# Patient Record
Sex: Female | Born: 1975 | Race: White | Hispanic: No | Marital: Single | Smoking: Former smoker
Health system: Southern US, Community
[De-identification: ages and names within clinical notes are randomized; demographics above are authoritative.]

## PROBLEM LIST (undated history)

## (undated) DIAGNOSIS — K259 Gastric ulcer, unspecified as acute or chronic, without hemorrhage or perforation: Secondary | ICD-10-CM

## (undated) DIAGNOSIS — J449 Chronic obstructive pulmonary disease, unspecified: Secondary | ICD-10-CM

## (undated) DIAGNOSIS — M810 Age-related osteoporosis without current pathological fracture: Secondary | ICD-10-CM

## (undated) DIAGNOSIS — J189 Pneumonia, unspecified organism: Secondary | ICD-10-CM

## (undated) DIAGNOSIS — K219 Gastro-esophageal reflux disease without esophagitis: Secondary | ICD-10-CM

## (undated) HISTORY — DX: Gastro-esophageal reflux disease without esophagitis: K21.9

## (undated) HISTORY — PX: CHOLECYSTECTOMY: SHX55

## (undated) HISTORY — DX: Pneumonia, unspecified organism: J18.9

---

## 2003-07-20 ENCOUNTER — Emergency Department (HOSPITAL_COMMUNITY): Admission: EM | Admit: 2003-07-20 | Discharge: 2003-07-20 | Payer: Self-pay | Admitting: *Deleted

## 2007-02-18 ENCOUNTER — Emergency Department (HOSPITAL_COMMUNITY): Admission: EM | Admit: 2007-02-18 | Discharge: 2007-02-18 | Payer: Self-pay | Admitting: Emergency Medicine

## 2007-04-01 ENCOUNTER — Emergency Department (HOSPITAL_COMMUNITY): Admission: EM | Admit: 2007-04-01 | Discharge: 2007-04-01 | Payer: Self-pay | Admitting: Emergency Medicine

## 2007-05-17 ENCOUNTER — Emergency Department (HOSPITAL_COMMUNITY): Admission: EM | Admit: 2007-05-17 | Discharge: 2007-05-17 | Payer: Self-pay | Admitting: Emergency Medicine

## 2007-07-06 ENCOUNTER — Emergency Department (HOSPITAL_COMMUNITY): Admission: EM | Admit: 2007-07-06 | Discharge: 2007-07-06 | Payer: Self-pay | Admitting: Emergency Medicine

## 2007-09-23 ENCOUNTER — Emergency Department (HOSPITAL_COMMUNITY): Admission: EM | Admit: 2007-09-23 | Discharge: 2007-09-23 | Payer: Self-pay | Admitting: Emergency Medicine

## 2007-11-30 ENCOUNTER — Emergency Department (HOSPITAL_COMMUNITY): Admission: EM | Admit: 2007-11-30 | Discharge: 2007-12-01 | Payer: Self-pay | Admitting: Emergency Medicine

## 2008-04-17 ENCOUNTER — Emergency Department (HOSPITAL_COMMUNITY): Admission: EM | Admit: 2008-04-17 | Discharge: 2008-04-17 | Payer: Self-pay | Admitting: Emergency Medicine

## 2008-10-16 ENCOUNTER — Emergency Department (HOSPITAL_COMMUNITY): Admission: EM | Admit: 2008-10-16 | Discharge: 2008-10-16 | Payer: Self-pay | Admitting: Emergency Medicine

## 2008-10-21 ENCOUNTER — Emergency Department (HOSPITAL_COMMUNITY): Admission: EM | Admit: 2008-10-21 | Discharge: 2008-10-21 | Payer: Self-pay | Admitting: Emergency Medicine

## 2009-06-18 ENCOUNTER — Observation Stay (HOSPITAL_COMMUNITY): Admission: EM | Admit: 2009-06-18 | Discharge: 2009-06-19 | Payer: Self-pay | Admitting: Emergency Medicine

## 2009-06-19 ENCOUNTER — Observation Stay (HOSPITAL_COMMUNITY): Admission: EM | Admit: 2009-06-19 | Discharge: 2009-06-19 | Payer: Self-pay | Admitting: Emergency Medicine

## 2009-08-09 ENCOUNTER — Emergency Department (HOSPITAL_COMMUNITY): Admission: EM | Admit: 2009-08-09 | Discharge: 2009-08-10 | Payer: Self-pay | Admitting: Emergency Medicine

## 2009-12-31 ENCOUNTER — Emergency Department (HOSPITAL_COMMUNITY): Admission: EM | Admit: 2009-12-31 | Discharge: 2009-12-31 | Payer: Self-pay | Admitting: Emergency Medicine

## 2010-04-30 ENCOUNTER — Emergency Department (HOSPITAL_COMMUNITY): Admission: EM | Admit: 2010-04-30 | Discharge: 2010-04-30 | Payer: Self-pay | Admitting: Emergency Medicine

## 2010-06-23 ENCOUNTER — Emergency Department (HOSPITAL_COMMUNITY): Admission: EM | Admit: 2010-06-23 | Discharge: 2010-06-23 | Payer: Self-pay | Admitting: Emergency Medicine

## 2010-10-14 LAB — URINALYSIS, ROUTINE W REFLEX MICROSCOPIC
Bilirubin Urine: NEGATIVE
Hgb urine dipstick: NEGATIVE
Nitrite: NEGATIVE
Specific Gravity, Urine: 1.025 (ref 1.005–1.030)

## 2010-10-14 LAB — GC/CHLAMYDIA PROBE AMP, GENITAL
Chlamydia, DNA Probe: NEGATIVE
GC Probe Amp, Genital: POSITIVE — AB

## 2010-10-19 LAB — DIFFERENTIAL
Basophils Absolute: 0 10*3/uL (ref 0.0–0.1)
Eosinophils Absolute: 0 10*3/uL (ref 0.0–0.7)
Lymphocytes Relative: 12 % (ref 12–46)
Monocytes Absolute: 0.5 10*3/uL (ref 0.1–1.0)
Neutro Abs: 6 10*3/uL (ref 1.7–7.7)

## 2010-10-19 LAB — BASIC METABOLIC PANEL
CO2: 22 mEq/L (ref 19–32)
Calcium: 8.6 mg/dL (ref 8.4–10.5)
Creatinine, Ser: 0.69 mg/dL (ref 0.4–1.2)
Glucose, Bld: 110 mg/dL — ABNORMAL HIGH (ref 70–99)
Potassium: 4.3 mEq/L (ref 3.5–5.1)
Sodium: 132 mEq/L — ABNORMAL LOW (ref 135–145)

## 2010-10-19 LAB — URINALYSIS, ROUTINE W REFLEX MICROSCOPIC
Bilirubin Urine: NEGATIVE
Glucose, UA: NEGATIVE mg/dL
Leukocytes, UA: NEGATIVE
Nitrite: NEGATIVE
Specific Gravity, Urine: 1.015 (ref 1.005–1.030)
pH: 6 (ref 5.0–8.0)

## 2010-10-19 LAB — CBC
HCT: 40.1 % (ref 36.0–46.0)
Hemoglobin: 14 g/dL (ref 12.0–15.0)

## 2010-10-19 LAB — URINE MICROSCOPIC-ADD ON

## 2010-10-19 LAB — POCT PREGNANCY, URINE: Preg Test, Ur: NEGATIVE

## 2010-10-20 LAB — URINALYSIS, ROUTINE W REFLEX MICROSCOPIC
Bilirubin Urine: NEGATIVE
Hgb urine dipstick: NEGATIVE
Urobilinogen, UA: 0.2 mg/dL (ref 0.0–1.0)

## 2010-11-05 LAB — CBC
HCT: 43.1 % (ref 36.0–46.0)
Hemoglobin: 15 g/dL (ref 12.0–15.0)
MCV: 97.3 fL (ref 78.0–100.0)

## 2010-11-05 LAB — DIFFERENTIAL
Basophils Absolute: 0.4 10*3/uL — ABNORMAL HIGH (ref 0.0–0.1)
Lymphs Abs: 3.9 10*3/uL (ref 0.7–4.0)
Monocytes Absolute: 0.6 10*3/uL (ref 0.1–1.0)
Neutro Abs: 4.8 10*3/uL (ref 1.7–7.7)
Neutrophils Relative %: 48 % (ref 43–77)

## 2010-11-05 LAB — POCT I-STAT, CHEM 8
Calcium, Ion: 0.96 mmol/L — ABNORMAL LOW (ref 1.12–1.32)
Chloride: 107 mEq/L (ref 96–112)
Creatinine, Ser: 0.7 mg/dL (ref 0.4–1.2)
Glucose, Bld: 130 mg/dL — ABNORMAL HIGH (ref 70–99)
HCT: 45 % (ref 36.0–46.0)
Potassium: 3.7 mEq/L (ref 3.5–5.1)
Sodium: 137 mEq/L (ref 135–145)
TCO2: 22 mmol/L (ref 0–100)

## 2010-11-05 LAB — POCT CARDIAC MARKERS
CKMB, poc: <1 ng/mL — ABNORMAL LOW (ref 1.0–8.0)
Myoglobin, poc: 30 ng/mL (ref 12–200)
Troponin i, poc: <0.05 ng/mL (ref 0.00–0.09)

## 2011-04-17 ENCOUNTER — Encounter: Payer: Self-pay | Admitting: Emergency Medicine

## 2011-04-17 ENCOUNTER — Emergency Department (HOSPITAL_COMMUNITY)
Admission: EM | Admit: 2011-04-17 | Discharge: 2011-04-17 | Disposition: A | Discharge status: Home / Self Care | Payer: Self-pay | Attending: Emergency Medicine | Admitting: Emergency Medicine

## 2011-04-17 DIAGNOSIS — J45909 Unspecified asthma, uncomplicated: Secondary | ICD-10-CM | POA: Insufficient documentation

## 2011-04-17 DIAGNOSIS — F17200 Nicotine dependence, unspecified, uncomplicated: Secondary | ICD-10-CM | POA: Insufficient documentation

## 2011-04-17 DIAGNOSIS — S61209A Unspecified open wound of unspecified finger without damage to nail, initial encounter: Secondary | ICD-10-CM | POA: Insufficient documentation

## 2011-04-17 DIAGNOSIS — W268XXA Contact with other sharp object(s), not elsewhere classified, initial encounter: Secondary | ICD-10-CM | POA: Insufficient documentation

## 2011-04-17 DIAGNOSIS — S61219A Laceration without foreign body of unspecified finger without damage to nail, initial encounter: Secondary | ICD-10-CM

## 2011-04-17 MED ORDER — LIDOCAINE HCL (PF) 2 % IJ SOLN
INTRAMUSCULAR | Status: AC
  Administered 2011-04-17: 17:00:00
  Filled 2011-04-17: qty 10

## 2011-04-17 NOTE — ED Provider Notes (Signed)
Medical screening examination/treatment/procedure(s) were performed by non-physician practitioner and as supervising physician I was immediately available for consultation/collaboration.  [name], MD 04/17/11 [name]

## 2011-04-17 NOTE — ED Notes (Signed)
Pt reports cutting her left middle finger today on a metal can.  Bleeding is controlled.  Pt cannot recall her last tetanus vaccination.

## 2011-04-17 NOTE — ED Notes (Signed)
Pt cut L middle finger opening a can of chicken breasts. Cut on metal lid.

## 2011-04-17 NOTE — ED Provider Notes (Signed)
History     CSN: [id number] Arrival date & time: 04/17/2011  3:23 PM   Chief Complaint  Patient presents with  . Laceration    L 2nd digit     (Include location/radiation/quality/duration/timing/severity/associated sxs/prior treatment) Patient is a 35 y.o. female presenting with skin laceration. The history is provided by the patient. No language interpreter was used.  Laceration  The incident occurred 1 to 2 hours ago. The laceration is located on the left hand. The laceration is 2 cm in size. The laceration mechanism was a a metal edge. The pain is moderate. The pain has been constant since onset. She reports no foreign bodies present. Her tetanus status is UTD.     Past Medical History  Diagnosis Date  . Asthma      Past Surgical History  Procedure Date  . Cholecystectomy     Family History  Problem Relation Age of Onset  . Hypertension Mother   . Hypertension Father     History  Substance Use Topics  . Smoking status: Current Everyday Smoker -- 0.5 packs/day for 22 years    Types: Cigarettes  . Smokeless tobacco: Never Used  . Alcohol Use: Yes     occasionally    OB History    Grav Para Term Preterm Abortions TAB SAB Ect Mult Living   4 4 4       4       Review of Systems  Skin: Positive for wound.  All other systems reviewed and are negative.    Allergies  Review of patient's allergies indicates no known allergies.  Home Medications   Current Outpatient Rx  Name Route Sig Dispense Refill  . ALBUTEROL IN Inhalation Inhale 2 puffs into the lungs as needed. For shortness of breath       Physical Exam    BP 114/81  Pulse 100  Temp(Src) 98.6 F (37 C) (Oral)  Resp 14  Ht 5\' 4"  (1.626 m)  Wt 202 lb (91.627 kg)  BMI 34.67 kg/m2  SpO2 97%  LMP 03/23/2011  Physical Exam  Nursing note and vitals reviewed. Constitutional: She is oriented to person, place, and time. Vital signs are normal. She appears well-developed and well-nourished.  HENT:   Head: Normocephalic and atraumatic.  Right Ear: External ear normal.  Left Ear: External ear normal.  Nose: Nose normal.  Mouth/Throat: No oropharyngeal exudate.  Eyes: Conjunctivae and EOM are normal. Pupils are equal, round, and reactive to light. Right eye exhibits no discharge. Left eye exhibits no discharge. No scleral icterus.  Neck: Normal range of motion. Neck supple. No JVD present. No tracheal deviation present. No thyromegaly present.  Cardiovascular: Normal rate, regular rhythm, normal heart sounds, intact distal pulses and normal pulses.  Exam reveals no gallop and no friction rub.   No murmur heard. Pulmonary/Chest: Effort normal and breath sounds normal. No stridor. No respiratory distress. She has no wheezes. She has no rales. She exhibits no tenderness.  Abdominal: Soft. Normal appearance and bowel sounds are normal. She exhibits no distension and no mass. There is no tenderness. There is no rebound and no guarding.  Musculoskeletal: Normal range of motion. She exhibits tenderness. She exhibits no edema.       Hands: Lymphadenopathy:    She has no cervical adenopathy.  Neurological: She is alert and oriented to person, place, and time. She has normal strength and normal reflexes. No cranial nerve deficit or sensory deficit. Coordination normal. GCS eye subscore is 4. GCS verbal subscore  is 5. GCS motor subscore is 6.  Skin: Skin is warm and dry. No rash noted. She is not diaphoretic.  Psychiatric: She has a normal mood and affect. Her speech is normal and behavior is normal. Judgment and thought content normal. Cognition and memory are normal.    ED Course  LACERATION REPAIR Date/Time: 04/17/2011 4:30 PM Performed by: Worthy Rancher Authorized by: [name] Consent: Verbal consent obtained. Written consent not obtained. Risks and benefits: risks, benefits and alternatives were discussed Consent given by: patient Patient understanding: patient states understanding of  the procedure being performed Imaging studies: imaging studies not available Patient identity confirmed: verbally with patient Time out: Immediately prior to procedure a "time out" was called to verify the correct patient, procedure, equipment, support staff and site/side marked as required. Body area: upper extremity Laceration length: 2 cm Foreign bodies: no foreign bodies Tendon involvement: none Nerve involvement: none Vascular damage: no Anesthesia: local infiltration Local anesthetic: lidocaine 2% without epinephrine Anesthetic total: 2 ml Patient sedated: no Preparation: Patient was prepped and draped in the usual sterile fashion. Irrigation solution: saline Irrigation method: syringe Amount of cleaning: extensive Debridement: none Degree of undermining: none Skin closure: 4-0 nylon Number of sutures: 4 Technique: simple Approximation: close Approximation difficulty: simple Dressing: antibiotic ointment Patient tolerance: Patient tolerated the procedure well with no immediate complications.    Results for orders placed during the hospital encounter of 06/23/10  WET PREP, GENITAL      Component Value Range   Yeast, Wet Prep NONE SEEN  NONE SEEN    Trich, Wet Prep MODERATE (*) NONE SEEN    Clue Cells, Wet Prep FEW (*) NONE SEEN    WBC, Wet Prep HPF POC FEW (*) NONE SEEN   URINALYSIS, ROUTINE W REFLEX MICROSCOPIC      Component Value Range   Color, Urine YELLOW  YELLOW    Appearance CLEAR  CLEAR    Specific Gravity, Urine 1.025  1.005 - 1.030    pH 6.0  5.0 - 8.0    Glucose, UA NEGATIVE  NEGATIVE (mg/dL)   Hgb urine dipstick NEGATIVE  NEGATIVE    Bilirubin Urine NEGATIVE  NEGATIVE    Ketones, ur NEGATIVE  NEGATIVE (mg/dL)   Protein, ur NEGATIVE  NEGATIVE (mg/dL)   Urobilinogen, UA 1.0  0.0 - 1.0 (mg/dL)   Nitrite NEGATIVE  NEGATIVE    Leukocytes, UA    NEGATIVE    Value: NEGATIVE MICROSCOPIC NOT DONE ON URINES WITH NEGATIVE PROTEIN, BLOOD, LEUKOCYTES, NITRITE,  OR GLUCOSE <1000 mg/dL.  POCT PREGNANCY, URINE      Component Value Range   Preg Test, Ur       Value: NEGATIVE            THE SENSITIVITY OF THIS     METHODOLOGY IS >24 mIU/mL  GC/CHLAMYDIA PROBE AMP, GENITAL      Component Value Range   GC Probe Amp, Genital   (*) NEGATIVE    Value: POSITIVE     (NOTE)  Testing performed using the BD Probe[employer] Qx Chlamydia trachomatis and Neisseria gonorrhea amplified DNA assay.  Performed at:  [facility]               [address]                    [place], [place] [address]  [id number]   Chlamydia, DNA Probe    NEGATIVE    Value: NEGATIVE     (NOTE)  Testing performed using the BD Probe[employer] Qx Chlamydia trachomatis and Neisseria gonorrhea amplified DNA assay.  Performed at:  [facility]               [address]                    [place], [place] [address]               [id number]   No results found.   No diagnosis found.   MDM        Worthy Rancher, PA 04/17/11 [phone]

## 2011-04-24 LAB — POCT I-STAT CREATININE
Creatinine, Ser: 0.8
Operator id: 295131

## 2011-04-24 LAB — I-STAT 8, (EC8 V) (CONVERTED LAB)
Acid-Base Excess: 1
BUN: 11
Chloride: 104
Glucose, Bld: 94
Potassium: 4
pH, Ven: 7.482 — ABNORMAL HIGH

## 2011-05-14 LAB — RAPID STREP SCREEN (MED CTR MEBANE ONLY): Streptococcus, Group A Screen (Direct): NEGATIVE

## 2011-05-14 LAB — STREP A DNA PROBE

## 2011-10-24 ENCOUNTER — Encounter (HOSPITAL_COMMUNITY): Payer: Self-pay | Admitting: Emergency Medicine

## 2011-10-24 ENCOUNTER — Emergency Department (HOSPITAL_COMMUNITY): Payer: Self-pay

## 2011-10-24 ENCOUNTER — Emergency Department (HOSPITAL_COMMUNITY)
Admission: EM | Admit: 2011-10-24 | Discharge: 2011-10-24 | Disposition: A | Discharge status: Home / Self Care | Payer: Self-pay | Attending: Emergency Medicine | Admitting: Emergency Medicine

## 2011-10-24 DIAGNOSIS — J45909 Unspecified asthma, uncomplicated: Secondary | ICD-10-CM | POA: Insufficient documentation

## 2011-10-24 DIAGNOSIS — W19XXXA Unspecified fall, initial encounter: Secondary | ICD-10-CM | POA: Insufficient documentation

## 2011-10-24 DIAGNOSIS — S93409A Sprain of unspecified ligament of unspecified ankle, initial encounter: Secondary | ICD-10-CM | POA: Insufficient documentation

## 2011-10-24 DIAGNOSIS — Y92009 Unspecified place in unspecified non-institutional (private) residence as the place of occurrence of the external cause: Secondary | ICD-10-CM | POA: Insufficient documentation

## 2011-10-24 DIAGNOSIS — F17200 Nicotine dependence, unspecified, uncomplicated: Secondary | ICD-10-CM | POA: Insufficient documentation

## 2011-10-24 MED ORDER — IBUPROFEN 800 MG PO TABS
800.0000 mg | ORAL_TABLET | Freq: Once | ORAL | Status: AC
  Administered 2011-10-24: 800 mg via ORAL
  Filled 2011-10-24: qty 1

## 2011-10-24 MED ORDER — IBUPROFEN 800 MG PO TABS: 800.0000 mg | ORAL_TABLET | Freq: Three times a day (TID) | ORAL | Status: AC

## 2011-10-24 MED ORDER — HYDROCODONE-ACETAMINOPHEN 5-325 MG PO TABS
1.0000 | ORAL_TABLET | Freq: Once | ORAL | Status: AC
  Administered 2011-10-24: 1 via ORAL
  Filled 2011-10-24: qty 1

## 2011-10-24 MED ORDER — HYDROCODONE-ACETAMINOPHEN 5-325 MG PO TABS: ORAL_TABLET | ORAL | Status: AC

## 2011-10-24 NOTE — Discharge Instructions (Signed)
Ankle Sprain An ankle sprain is an injury to the strong, fibrous tissues (ligaments) that hold the bones of your ankle joint together.  CAUSES Ankle sprain usually is caused by a fall or by twisting your ankle. People who participate in sports are more prone to these types of injuries.  SYMPTOMS  Symptoms of ankle sprain include:  Pain in your ankle. The pain may be present at rest or only when you are trying to stand or walk.   Swelling.   Bruising. Bruising may develop immediately or within 1 to 2 days after your injury.   Difficulty standing or walking.  DIAGNOSIS  Your caregiver will ask you details about your injury and perform a physical exam of your ankle to determine if you have an ankle sprain. During the physical exam, your caregiver will press and squeeze specific areas of your foot and ankle. Your caregiver will try to move your ankle in certain ways. An X-ray exam may be done to be sure a bone was not broken or a ligament did not separate from one of the bones in your ankle (avulsion).  TREATMENT  Certain types of braces can help stabilize your ankle. Your caregiver can make a recommendation for this. Your caregiver may recommend the use of medication for pain. If your sprain is severe, your caregiver may refer you to a surgeon who helps to restore function to parts of your skeletal system (orthopedist) or a physical therapist. HOME CARE INSTRUCTIONS  Apply ice to your injury for 1 to 2 days or as directed by your caregiver. Applying ice helps to reduce inflammation and pain.  Put ice in a plastic bag.   Place a towel between your skin and the bag.   Leave the ice on for 15 to 20 minutes at a time, every 2 hours while you are awake.   Take over-the-counter or prescription medicines for pain, discomfort, or fever only as directed by your caregiver.   Keep your injured leg elevated, when possible, to lessen swelling.   If your caregiver recommends crutches, use them as  instructed. Gradually, put weight on the affected ankle. Continue to use crutches or a cane until you can walk without feeling pain in your ankle.   If you have a plaster splint, wear the splint as directed by your caregiver. Do not rest it on anything harder than a pillow the first 24 hours. Do not put weight on it. Do not get it wet. You may take it off to take a shower or bath.   You may have been given an elastic bandage to wear around your ankle to provide support. If the elastic bandage is too tight (you have numbness or tingling in your foot or your foot becomes cold and blue), adjust the bandage to make it comfortable.   If you have an air splint, you may blow more air into it or let air out to make it more comfortable. You may take your splint off at night and before taking a shower or bath.   Wiggle your toes in the splint several times per day if you are able.  SEEK MEDICAL CARE IF:   You have an increase in bruising, swelling, or pain.   Your toes feel cold.   Pain relief is not achieved with medication.  SEEK IMMEDIATE MEDICAL CARE IF: Your toes are numb or blue or you have severe pain. MAKE SURE YOU:   Understand these instructions.   Will watch your condition.     Will get help right away if you are not doing well or get worse.  Document Released: 07/20/2005 Document Revised: 07/09/2011 Document Reviewed: 02/22/2008 [employer] Patient Information 2012 [employer]. 

## 2011-10-24 NOTE — ED Notes (Signed)
Pt reports missing a step on stairs last night causing her to twist her left ankle inward. Ankle has noted posterior swelling. Pt denies other injuries at this time.  Ice pack applied and ankle elevated. Xray pending.

## 2011-10-24 NOTE — ED Notes (Signed)
Pt c/o left ankle pain after falling down steps this am.

## 2011-10-27 NOTE — ED Provider Notes (Signed)
History     CSN: [id number]  Arrival date & time 10/24/11  1135   First MD Initiated Contact with Patient 10/24/11 1211      Chief Complaint  Patient presents with  . Ankle Pain  . Fall    (Consider location/radiation/quality/duration/timing/severity/associated sxs/prior treatment) Patient is a 36 y.o. female presenting with ankle pain and fall. The history is provided by the patient. No language interpreter was used.  Ankle Pain  The incident occurred 12 to 24 hours ago. The incident occurred at home. The injury mechanism was a fall and torsion. The pain is present in the left ankle. The quality of the pain is described as throbbing. The pain is moderate. The pain has been constant since onset. Associated symptoms include inability to bear weight and tingling. Pertinent negatives include no numbness, no loss of motion, no muscle weakness and no loss of sensation. She reports no foreign bodies present. The symptoms are aggravated by activity, bearing weight and palpation. She has tried nothing for the symptoms. The treatment provided no relief.  Fall Associated symptoms include tingling. Pertinent negatives include no numbness.    Past Medical History  Diagnosis Date  . Asthma     Past Surgical History  Procedure Date  . Cholecystectomy     Family History  Problem Relation Age of Onset  . Hypertension Mother   . Hypertension Father     History  Substance Use Topics  . Smoking status: Current Everyday Smoker -- 0.5 packs/day for 22 years    Types: Cigarettes  . Smokeless tobacco: Never Used  . Alcohol Use: Yes     occasionally    OB History    Grav Para Term Preterm Abortions TAB SAB Ect Mult Living   4 4 4       4       Review of Systems  Musculoskeletal: Positive for joint swelling and arthralgias. Negative for back pain and gait problem.  Skin: Negative.   Neurological: Positive for tingling. Negative for weakness and numbness.  All other systems reviewed  and are negative.    Allergies  Review of patient's allergies indicates no known allergies.  Home Medications   Current Outpatient Rx  Name Route Sig Dispense Refill  . HYDROCODONE-ACETAMINOPHEN 5-325 MG PO TABS  Take one-two tabs po q 4-6 hrs prn pain 12 tablet 0  . IBUPROFEN 800 MG PO TABS Oral Take 1 tablet (800 mg total) by mouth 3 (three) times daily. 21 tablet 0    BP 154/72  Pulse 100  Temp(Src) 98 F (36.7 C) (Oral)  Resp 17  Ht 5\' 3"  (1.6 m)  Wt 265 lb (120.203 kg)  BMI 46.94 kg/m2  SpO2 97%  LMP 09/29/2011  Physical Exam  Nursing note and vitals reviewed. Constitutional: She is oriented to person, place, and time. She appears well-developed and well-nourished. No distress.  HENT:  Head: Normocephalic and atraumatic.  Neck: Neck supple.  Cardiovascular: Normal rate, regular rhythm and normal heart sounds.   Pulmonary/Chest: Effort normal and breath sounds normal. No respiratory distress.  Musculoskeletal: She exhibits tenderness. She exhibits no edema.       Left ankle: She exhibits decreased range of motion, swelling and deformity. She exhibits no ecchymosis, no laceration and normal pulse. tenderness. Lateral malleolus tenderness found. No head of 5th metatarsal and no proximal fibula tenderness found. Achilles tendon normal.       Feet:       ttp of lateral left ankle.  DP pulse  is brisk, sensation intact.  No calf tenderness.    Neurological: She is alert and oriented to person, place, and time. She exhibits normal muscle tone. Coordination normal.  Skin: Skin is warm and dry.    ED Course  Procedures (including critical care time)  Dg Ankle Complete Left  10/24/2011  *RADIOLOGY REPORT*  Clinical Data: Fall, lateral ankle pain  LEFT ANKLE COMPLETE - 3+ VIEW  Comparison: None.  Findings: No fracture or dislocation is seen.  The ankle mortise is intact.  The base of the fifth metatarsal is unremarkable.  Mild soft tissue swelling, most prominent laterally.   IMPRESSION: No fracture or dislocation is seen.  Mild lateral ankle swelling.  Original Report Authenticated By: Charline Bills, M.D.    1. Ankle sprain     ASO splint applied and crutcjes given.  Pain improved.  Remains NV intact.  MDM   Pt with ankle pain,  Imaging is negative for fx, pt agrees to f/u with ortho.  Patient / Family / Caregiver understand and agree with initial ED impression and plan with expectations set for ED visit. Pt stable in ED with no significant deterioration in condition. Pt feels improved after observation and/or treatment in ED.         Kelsi Benham L. [place], [place] 10/27/11 2130

## 2011-10-28 NOTE — ED Provider Notes (Signed)
Medical screening examination/treatment/procedure(s) were performed by non-physician practitioner and as supervising physician I was immediately available for consultation/collaboration.    Alec Jaros W Jerin Franzel, MD 10/28/11 0730 

## 2011-11-27 ENCOUNTER — Emergency Department (HOSPITAL_COMMUNITY): Payer: Self-pay

## 2011-11-27 ENCOUNTER — Emergency Department (HOSPITAL_COMMUNITY)
Admission: EM | Admit: 2011-11-27 | Discharge: 2011-11-27 | Disposition: A | Discharge status: Home / Self Care | Payer: Self-pay | Attending: Emergency Medicine | Admitting: Emergency Medicine

## 2011-11-27 ENCOUNTER — Encounter (HOSPITAL_COMMUNITY): Payer: Self-pay | Admitting: *Deleted

## 2011-11-27 DIAGNOSIS — F17200 Nicotine dependence, unspecified, uncomplicated: Secondary | ICD-10-CM | POA: Insufficient documentation

## 2011-11-27 DIAGNOSIS — J45909 Unspecified asthma, uncomplicated: Secondary | ICD-10-CM | POA: Insufficient documentation

## 2011-11-27 DIAGNOSIS — M25539 Pain in unspecified wrist: Secondary | ICD-10-CM | POA: Insufficient documentation

## 2011-11-27 DIAGNOSIS — W010XXA Fall on same level from slipping, tripping and stumbling without subsequent striking against object, initial encounter: Secondary | ICD-10-CM | POA: Insufficient documentation

## 2011-11-27 DIAGNOSIS — M25529 Pain in unspecified elbow: Secondary | ICD-10-CM | POA: Insufficient documentation

## 2011-11-27 DIAGNOSIS — S42409A Unspecified fracture of lower end of unspecified humerus, initial encounter for closed fracture: Secondary | ICD-10-CM | POA: Insufficient documentation

## 2011-11-27 DIAGNOSIS — S42401A Unspecified fracture of lower end of right humerus, initial encounter for closed fracture: Secondary | ICD-10-CM

## 2011-11-27 MED ORDER — IBUPROFEN 800 MG PO TABS
800.0000 mg | ORAL_TABLET | Freq: Once | ORAL | Status: AC
  Administered 2011-11-27: 800 mg via ORAL
  Filled 2011-11-27: qty 1

## 2011-11-27 MED ORDER — ONDANSETRON HCL 4 MG PO TABS
4.0000 mg | ORAL_TABLET | Freq: Once | ORAL | Status: AC
  Administered 2011-11-27: 4 mg via ORAL
  Filled 2011-11-27: qty 1

## 2011-11-27 MED ORDER — OXYCODONE-ACETAMINOPHEN 5-325 MG PO TABS: 1.0000 | ORAL_TABLET | ORAL | Status: AC | PRN

## 2011-11-27 MED ORDER — OXYCODONE-ACETAMINOPHEN 5-325 MG PO TABS
1.0000 | ORAL_TABLET | Freq: Once | ORAL | Status: AC
  Administered 2011-11-27: 1 via ORAL
  Filled 2011-11-27: qty 1

## 2011-11-27 NOTE — ED Notes (Signed)
Pt states that she tripped over the cord to her computer last night landing on right elbow and wrist area, pt c/o pain to right elbow and right wrist, cms intact distal.

## 2011-11-27 NOTE — ED Provider Notes (Signed)
History     CSN: [id number]  Arrival date & time 11/27/11  4098   First MD Initiated Contact with Patient 11/27/11 [phone]      Chief Complaint  Patient presents with  . Fall    (Consider location/radiation/quality/duration/timing/severity/associated sxs/prior treatment) HPI Comments: Patient states she tripped over the cord to her computer on last evening. She landed on the right elbow. She now has increasing pain of the right elbow and wrist area. She denies any other injuries. She denies being on any blood thinning type medications. She has not had radius surgery or procedures on the right upper extremity. She has not taken anything for the pain up to this point.  Patient is a 36 y.o. female presenting with fall. The history is provided by the patient.  Fall Pertinent negatives include no abdominal pain and no hematuria.    Past Medical History  Diagnosis Date  . Asthma     Past Surgical History  Procedure Date  . Cholecystectomy     Family History  Problem Relation Age of Onset  . Hypertension Mother   . Hypertension Father     History  Substance Use Topics  . Smoking status: Current Everyday Smoker -- 0.5 packs/day for 22 years    Types: Cigarettes  . Smokeless tobacco: Never Used  . Alcohol Use: Yes     occasionally    OB History    Grav Para Term Preterm Abortions TAB SAB Ect Mult Living   4 4 4       4       Review of Systems  Constitutional: Negative for activity change.       All ROS Neg except as noted in HPI  HENT: Negative for nosebleeds and neck pain.   Eyes: Negative for photophobia and discharge.  Respiratory: Positive for wheezing. Negative for cough and shortness of breath.   Cardiovascular: Negative for chest pain and palpitations.  Gastrointestinal: Negative for abdominal pain and blood in stool.  Genitourinary: Negative for dysuria, frequency and hematuria.  Musculoskeletal: Negative for back pain and arthralgias.  Skin: Negative.     Neurological: Negative for dizziness, seizures and speech difficulty.  Psychiatric/Behavioral: Negative for hallucinations and confusion.    Allergies  Review of patient's allergies indicates no known allergies.  Home Medications  No current outpatient prescriptions on file.  BP 109/83  Pulse 99  Temp 98.8 F (37.1 C)  Resp 20  Ht 5\' 4"  (1.626 m)  Wt 250 lb (113.399 kg)  BMI 42.91 kg/m2  SpO2 99%  LMP 10/29/2011  Physical Exam  Nursing note and vitals reviewed. Constitutional: She is oriented to person, place, and time. She appears well-developed and well-nourished.  Non-toxic appearance.  HENT:  Head: Normocephalic.  Right Ear: Tympanic membrane and external ear normal.  Left Ear: Tympanic membrane and external ear normal.  Eyes: EOM and lids are normal. Pupils are equal, round, and reactive to light.  Neck: Normal range of motion. Neck supple. Carotid bruit is not present.  Cardiovascular: Normal rate, regular rhythm, normal heart sounds, intact distal pulses and normal pulses.   Pulmonary/Chest: Breath sounds normal. No respiratory distress.  Abdominal: Soft. Bowel sounds are normal. There is no tenderness. There is no guarding.  Musculoskeletal: Normal range of motion.       There is soreness to range of motion of the right wrist. There is no pain in the anatomical snuff box. There is no deformity of the forearm. There is pain with attempted range of  motion involving the elbow. There is pain with pronation and supination of the right forearm area. The distal pulses are symmetrical. There is no shoulder pain or involvement on the right side.  Lymphadenopathy:       Head (right side): No submandibular adenopathy present.       Head (left side): No submandibular adenopathy present.    She has no cervical adenopathy.  Neurological: She is alert and oriented to person, place, and time. She has normal strength. No cranial nerve deficit or sensory deficit.  Skin: Skin is warm  and dry.  Psychiatric: She has a normal mood and affect. Her speech is normal.    ED Course  Procedures (including critical care time)  Labs Reviewed - No data to display Dg Elbow Complete Right  11/27/2011  *RADIOLOGY REPORT*  Clinical Data: History of fall complaining of elbow pain.  RIGHT ELBOW - COMPLETE 3+ VIEW  Comparison: No priors.  Findings: On the lateral projection there is a joint effusion (likely a lipohemarthrosis).  There is a very subtle nondisplaced radial head fracture which appears to be intra-articular.  One view demonstrates a slightly displaced thin fragment of cortex along the posterior aspect of the radial head, which likely represents a capsular avulsion fracture.  The visualized portions of the distal humerus and proximal ulna appear intact.  IMPRESSION: 1.  Findings, as above, compatible with a intra-articular nondisplaced radial head fracture, as well as a minimally displaced capsular avulsion fracture from the posterior aspect of the radial head.  Original Report Authenticated By: Florencia Reasons, M.D.     No diagnosis found.    MDM  I have reviewed nursing notes, vital signs, and all appropriate lab and imaging results for this patient. The patient fell on last evening, injuring the right elbow. The x-ray reveals an intra-articular nondisplaced radial head fracture. There is also a possible capsular avulsion fracture of the posterior aspect of the radial head. The patient is treated with a sling, ice, and pain medication. The patient is given a consultation to the orthopedist. Dr Mort Sawyers office called. Pt to call for appointment next week.      Kathie Dike, [place] 12/01/11 1121

## 2011-11-27 NOTE — Discharge Instructions (Signed)
Your xray suggest a fracture of the right elbow. Please use the sling until seen by the orthopedic specialist. Please call the surgeon listed above for office appointment. Use 3 ibuprofen after each meal an at bed time. Use Percocet for more severe pain. This may cause drowsiness and constipation, use with caution.Elbow Fracture, Simple A fracture is a break in one of the bones.When fractures are not displaced or separated, they may be treated with only a sling or splint. The sling or splint may only be required for two to three weeks. In these cases, often the elbow is put through early range of motion exercises to prevent the elbow from getting stiff. DIAGNOSIS  The diagnosis (learning what is wrong) of a fractured elbow is made by x-ray. These may be required before and after the elbow is put into a splint or cast. X-rays are taken after to make sure the bone pieces have not moved. HOME CARE INSTRUCTIONS   Only take over-the-counter or prescription medicines for pain, discomfort, or fever as directed by your caregiver.   If you have a splint held on with an elastic wrap and your hand or fingers become numb or cold and blue, loosen the wrap and reapply more loosely. See your caregiver if there is no relief.   You may use ice for twenty minutes, four times per day, for the first two to three days.   Use your elbow as directed.   See your caregiver as directed. It is very important to keep all follow-up referrals and appointments in order to avoid any long-term problems with your elbow including chronic pain or stiffness.  SEEK IMMEDIATE MEDICAL CARE IF:   There is swelling or increasing pain in elbow.   You begin to lose feeling or experience numbness or tingling in your hand or fingers.   You develop swelling of the hand and fingers.   You get a cold or blue hand or fingers on affected side.  MAKE SURE YOU:   Understand these instructions.   Will watch your condition.   Will get help  right away if you are not doing well or get worse.  Document Released: 07/14/2001 Document Revised: 07/09/2011 Document Reviewed: 06/04/2009 [facility] Patient Information 2012 [place], [place].

## 2011-12-01 NOTE — ED Provider Notes (Signed)
Evaluation and management procedures were performed by the PA/NP/resident physician under my supervision/collaboration.    Felisa Bonier, MD 12/01/11 [phone]

## 2011-12-03 ENCOUNTER — Ambulatory Visit: Payer: Self-pay | Admitting: Orthopedic Surgery

## 2011-12-07 ENCOUNTER — Encounter: Payer: Self-pay | Admitting: Orthopedic Surgery

## 2011-12-07 ENCOUNTER — Ambulatory Visit: Payer: Self-pay | Admitting: Orthopedic Surgery

## 2011-12-17 ENCOUNTER — Emergency Department (HOSPITAL_COMMUNITY)
Admission: EM | Admit: 2011-12-17 | Discharge: 2011-12-17 | Disposition: A | Discharge status: Home / Self Care | Payer: Self-pay | Attending: Emergency Medicine | Admitting: Emergency Medicine

## 2011-12-17 ENCOUNTER — Encounter (HOSPITAL_COMMUNITY): Payer: Self-pay | Admitting: *Deleted

## 2011-12-17 DIAGNOSIS — J45909 Unspecified asthma, uncomplicated: Secondary | ICD-10-CM | POA: Insufficient documentation

## 2011-12-17 DIAGNOSIS — M25529 Pain in unspecified elbow: Secondary | ICD-10-CM | POA: Insufficient documentation

## 2011-12-17 DIAGNOSIS — M25521 Pain in right elbow: Secondary | ICD-10-CM

## 2011-12-17 DIAGNOSIS — F17200 Nicotine dependence, unspecified, uncomplicated: Secondary | ICD-10-CM | POA: Insufficient documentation

## 2011-12-17 DIAGNOSIS — M25429 Effusion, unspecified elbow: Secondary | ICD-10-CM | POA: Insufficient documentation

## 2011-12-17 MED ORDER — HYDROCODONE-ACETAMINOPHEN 7.5-325 MG PO TABS: 1.0000 | ORAL_TABLET | ORAL | Status: AC | PRN

## 2011-12-17 MED ORDER — MELOXICAM 7.5 MG PO TABS: ORAL_TABLET | ORAL | Status: DC

## 2011-12-17 NOTE — ED Provider Notes (Signed)
History     CSN: [id number]  Arrival date & time 12/17/11  2101   First MD Initiated Contact with Patient 12/17/11 2224      Chief Complaint  Patient presents with  . Joint Swelling    (Consider location/radiation/quality/duration/timing/severity/associated sxs/prior treatment) HPI Comments: Patient states she sustained an injury to the right elbow approximately 2 weeks prior to this visit. She was advised to see the orthopedic physician, but was unable to afford the co-pay at this time. Patient states she is continuing to have pain of the right elbow at rest and when doing any movement and she requests assistance with her pain At this time. No new injury reported.  The history is provided by the patient.    Past Medical History  Diagnosis Date  . Asthma     Past Surgical History  Procedure Date  . Cholecystectomy     Family History  Problem Relation Age of Onset  . Hypertension Mother   . Hypertension Father     History  Substance Use Topics  . Smoking status: Current Everyday Smoker -- 0.5 packs/day for 22 years    Types: Cigarettes  . Smokeless tobacco: Never Used  . Alcohol Use: Yes     occasionally    OB History    Grav Para Term Preterm Abortions TAB SAB Ect Mult Living   4 4 4       4       Review of Systems  Constitutional: Negative for activity change.       All ROS Neg except as noted in HPI  HENT: Negative for nosebleeds and neck pain.   Eyes: Negative for photophobia and discharge.  Respiratory: Positive for wheezing. Negative for cough and shortness of breath.   Cardiovascular: Negative for chest pain and palpitations.  Gastrointestinal: Negative for abdominal pain and blood in stool.  Genitourinary: Negative for dysuria, frequency and hematuria.  Musculoskeletal: Positive for arthralgias. Negative for back pain.  Skin: Negative.   Neurological: Negative for dizziness, seizures and speech difficulty.  Psychiatric/Behavioral: Negative for  hallucinations and confusion.    Allergies  Review of patient's allergies indicates no known allergies.  Home Medications   Current Outpatient Rx  Name Route Sig Dispense Refill  . ALBUTEROL SULFATE HFA 108 (90 BASE) MCG/ACT IN AERS Inhalation Inhale 2 puffs into the lungs every 6 (six) hours as needed. FOR SHORTNESS OF BREATH    . HYDROCODONE-ACETAMINOPHEN 7.5-325 MG PO TABS Oral Take 1 tablet by mouth every 4 (four) hours as needed for pain. 20 tablet 0  . MELOXICAM 7.5 MG PO TABS  1 po bid with food 15 tablet 0    BP 136/74  Pulse 123  Temp(Src) 98.1 F (36.7 C) (Oral)  Resp 20  Ht 5\' 4"  (1.626 m)  Wt 250 lb (113.399 kg)  BMI 42.91 kg/m2  SpO2 97%  LMP 11/29/2011  Physical Exam  Nursing note and vitals reviewed. Constitutional: She is oriented to person, place, and time. She appears well-developed and well-nourished.  Non-toxic appearance.  HENT:  Head: Normocephalic.  Right Ear: Tympanic membrane and external ear normal.  Left Ear: Tympanic membrane and external ear normal.  Eyes: EOM and lids are normal. Pupils are equal, round, and reactive to light.  Neck: Normal range of motion. Neck supple. Carotid bruit is not present.  Cardiovascular: Normal rate, regular rhythm, normal heart sounds, intact distal pulses and normal pulses.   Pulmonary/Chest: No respiratory distress. She has wheezes.  Abdominal: Soft. Bowel sounds  are normal. There is no tenderness. There is no guarding.  Musculoskeletal: Normal range of motion.       Full range of motion of the fingers of the right. Full range of motion of the right wrist. There is pain to palpation and attempted movement of the right elbow. There is no hematoma present. The joint is not hot. There is full range of motion of the right shoulder. There is good capillary refill. The pulses are symmetrical.  Lymphadenopathy:       Head (right side): No submandibular adenopathy present.       Head (left side): No submandibular  adenopathy present.    She has no cervical adenopathy.  Neurological: She is alert and oriented to person, place, and time. She has normal strength. No cranial nerve deficit or sensory deficit.  Skin: Skin is warm and dry.  Psychiatric: She has a normal mood and affect. Her speech is normal.    ED Course  Procedures (including critical care time)  Labs Reviewed - No data to display No results found.   1. Elbow pain, right       MDM  I have reviewed nursing notes, vital signs, and all appropriate lab and imaging results for this patient. Patient sustained a fracture of the right elbow approximately 2-3 weeks ago. The patient was placed in a splint and and sling. Patient was given a consultation to orthopedics. The patient states the splint and sling cause her pain and to go" numb" and was very uncomfortable, so she removed. The patient also states that she did not have the money to pay for the initial visit to the orthopedic physician. The patient has been having increasing problems with pain. So she presents to the emergency department to be" evaluated" to see if she done any additional damage to the fractured area.  There were no new changes on the examination. The patient was advised to followup with the social worker here at the hospital for possible assistance. Patient was also advised to see one of the orthopedic clinics at one of the medical schools in the state to see if they could perhaps be of some help and assistance to her. A prescription for Mobic 7.5 mg twice a day with food, and Norco 7.5 mg every 4 hours, and 1, and a, and # 20 tablets given to the patient.       Kathie Dike, [place] 12/23/11 2348

## 2011-12-17 NOTE — ED Notes (Signed)
Pt reports she was seen here a couple of weeks for rt elbow injury, reports she was referred to orthopedics but was unable to make co-pay,

## 2011-12-17 NOTE — Discharge Instructions (Signed)
Arthralgia IT IS IMPORTANT THAT YOU HAVE YOUR ELBOW EXAMINED BY A BONE SPECIALIST. PLEASE CHECK WITH THE OFFICE PROCESSING YOUR PAPER WORK. YOU MAY ALSO BENEFIT FOR AN ORTHOPEDIC CLINIC AT ONE OF THE MEDICAL SCHOOLS.   Arthralgia is joint pain. A joint is a place where two bones meet. Joint pain can happen for many reasons. The joint can be bruised, stiff, infected, or weak from aging. Pain usually goes away after resting and taking medicine for soreness.  HOME CARE  Rest the joint as told by your doctor.   Keep the sore joint raised (elevated) for the first 24 hours.   Put ice on the joint area.   Put ice in a plastic bag.   Place a towel between your skin and the bag.   Leave the ice on for 15 to 20 minutes, 3 to 4 times a day.   Wear your splint, casting, elastic bandage, or sling as told by your doctor.   Only take medicine as told by your doctor. Do not take aspirin.   Use crutches as told by your doctor. Do not put weight on the joint until told to by your doctor.  GET HELP RIGHT AWAY IF:   You have bruising, puffiness (swelling), or more pain.   Your fingers or toes turn blue or start to lose feeling (numb).   Your medicine does not lessen the pain.   Your pain becomes severe.   You have a temperature by mouth above 102 F (38.9 C), not controlled by medicine.   You cannot move or use the joint.  MAKE SURE YOU:   Understand these instructions.   Will watch your condition.   Will get help right away if you are not doing well or get worse.  Document Released: 07/08/2009 Document Revised: 07/09/2011 Document Reviewed: 07/08/2009 [facility] Patient Information 2012 [place], [place].

## 2011-12-24 NOTE — ED Provider Notes (Signed)
Medical screening examination/treatment/procedure(s) were performed by non-physician practitioner and as supervising physician I was immediately available for consultation/collaboration.  [name], MD 12/24/11 [phone]

## 2012-06-02 ENCOUNTER — Emergency Department (HOSPITAL_COMMUNITY)
Admission: EM | Admit: 2012-06-02 | Discharge: 2012-06-02 | Disposition: A | Discharge status: Home / Self Care | Payer: Self-pay | Attending: Emergency Medicine | Admitting: Emergency Medicine

## 2012-06-02 ENCOUNTER — Encounter (HOSPITAL_COMMUNITY): Payer: Self-pay | Admitting: *Deleted

## 2012-06-02 ENCOUNTER — Emergency Department (HOSPITAL_COMMUNITY): Payer: Self-pay

## 2012-06-02 DIAGNOSIS — J45901 Unspecified asthma with (acute) exacerbation: Secondary | ICD-10-CM | POA: Insufficient documentation

## 2012-06-02 DIAGNOSIS — J3489 Other specified disorders of nose and nasal sinuses: Secondary | ICD-10-CM | POA: Insufficient documentation

## 2012-06-02 DIAGNOSIS — R509 Fever, unspecified: Secondary | ICD-10-CM | POA: Insufficient documentation

## 2012-06-02 DIAGNOSIS — J069 Acute upper respiratory infection, unspecified: Secondary | ICD-10-CM

## 2012-06-02 DIAGNOSIS — F17200 Nicotine dependence, unspecified, uncomplicated: Secondary | ICD-10-CM | POA: Insufficient documentation

## 2012-06-02 DIAGNOSIS — Z79899 Other long term (current) drug therapy: Secondary | ICD-10-CM | POA: Insufficient documentation

## 2012-06-02 DIAGNOSIS — J029 Acute pharyngitis, unspecified: Secondary | ICD-10-CM | POA: Insufficient documentation

## 2012-06-02 DIAGNOSIS — H9209 Otalgia, unspecified ear: Secondary | ICD-10-CM | POA: Insufficient documentation

## 2012-06-02 DIAGNOSIS — R51 Headache: Secondary | ICD-10-CM | POA: Insufficient documentation

## 2012-06-02 MED ORDER — PREDNISONE 10 MG PO TABS: 40.0000 mg | ORAL_TABLET | Freq: Every day | ORAL | Status: DC

## 2012-06-02 MED ORDER — ONDANSETRON HCL 4 MG/2ML IJ SOLN
4.0000 mg | Freq: Once | INTRAMUSCULAR | Status: AC
  Administered 2012-06-02: 4 mg via INTRAVENOUS
  Filled 2012-06-02: qty 2

## 2012-06-02 MED ORDER — ALBUTEROL SULFATE (5 MG/ML) 0.5% IN NEBU
2.5000 mg | INHALATION_SOLUTION | Freq: Once | RESPIRATORY_TRACT | Status: AC
  Administered 2012-06-02: 2.5 mg via RESPIRATORY_TRACT
  Filled 2012-06-02: qty 1

## 2012-06-02 MED ORDER — HYDROMORPHONE HCL PF 1 MG/ML IJ SOLN
1.0000 mg | Freq: Once | INTRAMUSCULAR | Status: AC
  Administered 2012-06-02: 1 mg via INTRAVENOUS
  Filled 2012-06-02: qty 1

## 2012-06-02 MED ORDER — IPRATROPIUM BROMIDE 0.02 % IN SOLN
0.5000 mg | Freq: Once | RESPIRATORY_TRACT | Status: AC
  Administered 2012-06-02: 0.5 mg via RESPIRATORY_TRACT
  Filled 2012-06-02: qty 2.5

## 2012-06-02 MED ORDER — ALBUTEROL SULFATE HFA 108 (90 BASE) MCG/ACT IN AERS: 1.0000 | INHALATION_SPRAY | Freq: Four times a day (QID) | RESPIRATORY_TRACT | Status: DC | PRN

## 2012-06-02 MED ORDER — SODIUM CHLORIDE 0.9 % IV BOLUS (SEPSIS)
1000.0000 mL | Freq: Once | INTRAVENOUS | Status: AC
  Administered 2012-06-02: 1000 mL via INTRAVENOUS

## 2012-06-02 MED ORDER — SODIUM CHLORIDE 0.9 % IV SOLN
INTRAVENOUS | Status: DC
  Administered 2012-06-02: 08:00:00 via INTRAVENOUS

## 2012-06-02 MED ORDER — ALBUTEROL SULFATE (5 MG/ML) 0.5% IN NEBU
2.5000 mg | INHALATION_SOLUTION | Freq: Once | RESPIRATORY_TRACT | Status: AC
  Administered 2012-06-02: 2.5 mg via RESPIRATORY_TRACT
  Filled 2012-06-02: qty 0.5

## 2012-06-02 MED ORDER — METHYLPREDNISOLONE SODIUM SUCC 125 MG IJ SOLR
125.0000 mg | Freq: Once | INTRAMUSCULAR | Status: AC
  Administered 2012-06-02: 125 mg via INTRAVENOUS
  Filled 2012-06-02: qty 2

## 2012-06-02 NOTE — ED Provider Notes (Signed)
History  This chart was scribed for Shelda Jakes, MD by Bennett Scrape. This patient was seen in room APA03/APA03 and the patient's care was started at 7:17AM.  CSN: [id number]  Arrival date & time 06/02/12  4098   First MD Initiated Contact with Patient 06/02/12 [phone]      Chief Complaint  Patient presents with  . URI     Patient is a 36 y.o. female presenting with URI. The history is provided by the patient. No language interpreter was used.  URI The primary symptoms include fever, headaches, ear pain, sore throat, cough and wheezing. Primary symptoms do not include abdominal pain, nausea, vomiting or rash. The current episode started 2 days ago. This is a new problem. The problem has been gradually worsening.  Symptoms associated with the illness include rhinorrhea. The illness is not associated with chills or congestion. The following treatments were addressed: Acetaminophen was not tried. A decongestant was not tried.    Linda Ellis is a 36 y.o. female with a h/o asthma who presents to the Emergency Department complaining of 2 days of gradual onset, gradually worsening, constant cold-like symptoms including rhinorrhea, cough, sore throat, bilateral otalgia described as pressure, and fever of 102 with associated wheezing attributed to asthma. She reports that she normally uses albuterol inhalers to treat her asthma symptoms but states that she ran out one week ago. She denies taking OTC medications at home to improve symptoms. She denies nausea, emesis, diarrhea and rash as associated symptoms. She is a current everyday smoker and occasional alcohol user.   Past Medical History  Diagnosis Date  . Asthma     Past Surgical History  Procedure Date  . Cholecystectomy     Family History  Problem Relation Age of Onset  . Hypertension Mother   . Hypertension Father     History  Substance Use Topics  . Smoking status: Current Every Day Smoker -- 0.5 packs/day for 22  years    Types: Cigarettes  . Smokeless tobacco: Never Used  . Alcohol Use: Yes     occasionally    OB History    Grav Para Term Preterm Abortions TAB SAB Ect Mult Living   4 4 4       4       Review of Systems  Constitutional: Positive for fever. Negative for chills.  HENT: Positive for ear pain, sore throat and rhinorrhea. Negative for congestion.   Respiratory: Positive for cough and wheezing.   Gastrointestinal: Negative for nausea, vomiting and abdominal pain.  Skin: Negative for rash.  Neurological: Positive for headaches.  All other systems reviewed and are negative.    Allergies  Review of patient's allergies indicates no known allergies.  Home Medications   Current Outpatient Rx  Name Route Sig Dispense Refill  . ALBUTEROL SULFATE HFA 108 (90 BASE) MCG/ACT IN AERS Inhalation Inhale 2 puffs into the lungs every 6 (six) hours as needed. FOR SHORTNESS OF BREATH    . ALBUTEROL SULFATE HFA 108 (90 BASE) MCG/ACT IN AERS Inhalation Inhale 1-2 puffs into the lungs every 6 (six) hours as needed for wheezing. 1 Inhaler 0  . MELOXICAM 7.5 MG PO TABS  1 po bid with food 15 tablet 0  . PREDNISONE 10 MG PO TABS Oral Take 4 tablets (40 mg total) by mouth daily. 20 tablet 0    Triage Vitals: BP 115/74  Pulse 122  Temp 98.7 F (37.1 C) (Oral)  Resp 20  Ht 5\' 4"  (  1.626 m)  Wt 260 lb (117.935 kg)  BMI 44.63 kg/m2  SpO2 95%  LMP 05/24/2012  Physical Exam  Nursing note and vitals reviewed. Constitutional: She is oriented to person, place, and time. She appears well-developed and well-nourished. No distress.  HENT:  Head: Normocephalic and atraumatic.       Mild erythema of the oropharynx, no tonsillar exudate, moist mucus membranes, TMs are normal bilaterally  Eyes: Conjunctivae normal and EOM are normal.  Neck: Neck supple. No tracheal deviation present.  Cardiovascular: Normal rate and regular rhythm.   No murmur heard.      HR between 117 to 122  Pulmonary/Chest:  Effort normal. No respiratory distress. She has wheezes (marked wheezing bilaterally).  Abdominal: Soft. Bowel sounds are normal. There is no tenderness.  Musculoskeletal: Normal range of motion.  Neurological: She is alert and oriented to person, place, and time.  Skin: Skin is warm and dry.  Psychiatric: She has a normal mood and affect. Her behavior is normal.    ED Course  Procedures (including critical care time)  DIAGNOSTIC STUDIES: Oxygen Saturation is 95% on room air, adequate by my interpretation.    COORDINATION OF CARE: 7:34 AM-Discussed treatment plan which includes IV fluids, breathing treatment and CXR with pt at bedside and pt agreed to plan.  7:45 AM- Ordered 1,000 mL of Bolus, 2.5 mg of 0.5 % albuterol nebulizer solution, 0.5 mg Atrovent, 4 mg injection of Zofran and 1 mg injection of Dilaudid   8:42 AM- Pt rechecked and is still wheezing. Will order another breathing treatment.  9:00 AM- Ordered 0.5 mg of Atrovent and 2.5 mg of 0.5 % albuterol nebulizer solution   9:53 AM-Pt rechecked and upon re-exam, pt is still wheezing and HR is around 103-107.   10:00 AM- Ordered 2.5 mg of 0.5 % albuterol nebulizer solution and 125 mg injection of Solu-medrol.  10:47 AM- Pt rechecked and upon re-exam is still wheezing faintly. Oxygen Saturation is between 90 to 94% on RA. Discussed discharge plan which includes following up with PCP and using albuterol inhaler with pt and pt agreed to plan.   Labs Reviewed - No data to display Dg Chest 2 View  06/02/2012  *RADIOLOGY REPORT*  Clinical Data: Cough, congestion, shortness of breath  CHEST - 2 VIEW  Comparison: 04/30/2010  Findings: Lungs are essentially clear.  No pleural effusion or pneumothorax.  Cardiomediastinal silhouette is within normal limits.  Visualized osseous structures are within normal limits.  IMPRESSION: No evidence of acute cardiopulmonary disease.   Original Report Authenticated By: Charline Bills, M.D.       1. Asthma attack   2. Upper respiratory infection       MDM  Patient improved with nebulizer treatments in the emergency department also got her and 25 mg IM Medrol. Wheezing is not completely resolved patient feels much better and feels like she can go home. Tachycardia also improved with fluids in the treatments. Chest x-rays negative for pneumonia. Patient has history of asthma this is most likely exacerbation of asthma due to her upper respiratory tract infection. Patient is nontoxic no acute distress.      I personally performed the services described in this documentation, which was scribed in my presence. The recorded information has been reviewed and considered.     Shelda Jakes, MD 06/02/12 [phone]

## 2012-06-02 NOTE — ED Notes (Addendum)
Pt reporting cold symptoms since Tuesday night.  Reporting cough, congestion, general body aches.  Unsure if she has fever or not.  Pt reports she does have albuterol inhaler, but ran out about 1 week ago.  No distress noted at this time.

## 2012-06-08 ENCOUNTER — Emergency Department (HOSPITAL_COMMUNITY)
Admission: EM | Admit: 2012-06-08 | Discharge: 2012-06-08 | Disposition: A | Discharge status: Home / Self Care | Payer: Self-pay | Attending: Emergency Medicine | Admitting: Emergency Medicine

## 2012-06-08 ENCOUNTER — Encounter (HOSPITAL_COMMUNITY): Payer: Self-pay | Admitting: *Deleted

## 2012-06-08 DIAGNOSIS — R51 Headache: Secondary | ICD-10-CM | POA: Insufficient documentation

## 2012-06-08 DIAGNOSIS — J45909 Unspecified asthma, uncomplicated: Secondary | ICD-10-CM

## 2012-06-08 DIAGNOSIS — F17200 Nicotine dependence, unspecified, uncomplicated: Secondary | ICD-10-CM | POA: Insufficient documentation

## 2012-06-08 DIAGNOSIS — J45901 Unspecified asthma with (acute) exacerbation: Secondary | ICD-10-CM | POA: Insufficient documentation

## 2012-06-08 DIAGNOSIS — Z79899 Other long term (current) drug therapy: Secondary | ICD-10-CM | POA: Insufficient documentation

## 2012-06-08 DIAGNOSIS — J3489 Other specified disorders of nose and nasal sinuses: Secondary | ICD-10-CM | POA: Insufficient documentation

## 2012-06-08 MED ORDER — AZITHROMYCIN 250 MG PO TABS: ORAL_TABLET | ORAL | Status: DC

## 2012-06-08 MED ORDER — PREDNISONE 20 MG PO TABS: ORAL_TABLET | ORAL | Status: DC

## 2012-06-08 MED ORDER — HYDROCODONE-ACETAMINOPHEN 5-325 MG PO TABS: 1.0000 | ORAL_TABLET | ORAL | Status: DC | PRN

## 2012-06-08 MED ORDER — GUAIFENESIN-CODEINE 100-10 MG/5ML PO SOLN
10.0000 mL | Freq: Once | ORAL | Status: AC
  Administered 2012-06-08: 10 mL via ORAL
  Filled 2012-06-08: qty 10

## 2012-06-08 MED ORDER — AZITHROMYCIN 250 MG PO TABS
500.0000 mg | ORAL_TABLET | Freq: Once | ORAL | Status: AC
  Administered 2012-06-08: 500 mg via ORAL
  Filled 2012-06-08: qty 2

## 2012-06-08 MED ORDER — ALBUTEROL SULFATE (5 MG/ML) 0.5% IN NEBU
2.5000 mg | INHALATION_SOLUTION | Freq: Once | RESPIRATORY_TRACT | Status: AC
  Administered 2012-06-08: 2.5 mg via RESPIRATORY_TRACT
  Filled 2012-06-08: qty 0.5

## 2012-06-08 MED ORDER — HYDROCODONE-ACETAMINOPHEN 5-325 MG PO TABS
1.0000 | ORAL_TABLET | Freq: Once | ORAL | Status: AC
  Administered 2012-06-08: 1 via ORAL
  Filled 2012-06-08: qty 1

## 2012-06-08 MED ORDER — PREDNISONE 50 MG PO TABS
60.0000 mg | ORAL_TABLET | Freq: Once | ORAL | Status: AC
  Administered 2012-06-08: 60 mg via ORAL
  Filled 2012-06-08: qty 1

## 2012-06-08 MED ORDER — IPRATROPIUM BROMIDE 0.02 % IN SOLN
0.5000 mg | Freq: Once | RESPIRATORY_TRACT | Status: AC
  Administered 2012-06-08: 0.5 mg via RESPIRATORY_TRACT
  Filled 2012-06-08: qty 2.5

## 2012-06-08 MED ORDER — GUAIFENESIN-CODEINE 100-10 MG/5ML PO SYRP: 10.0000 mL | ORAL_SOLUTION | Freq: Three times a day (TID) | ORAL | Status: DC | PRN

## 2012-06-08 NOTE — ED Provider Notes (Signed)
History   This chart was scribed for Carleene Cooper III, MD by Gerlean Ren. This patient was seen in room APA12/APA12 and the patient's care was started at 1:44 PM   CSN: [id number]  Arrival date & time 06/08/12  1231   First MD Initiated Contact with Patient 06/08/12 1342      Chief Complaint  Patient presents with  . Cough     The history is provided by the patient. No language interpreter was used.   Linda Ellis is a 36 y.o. female with h/o asthma who presents to the Emergency Department complaining of 8 days of gradually worsening cough producing brown phlegm with associated sore throat, rhinorrhea, HA, and mild dyspnea none of which have been relieved by robitussin.  Pt denies chest pain, fever, nausea, emesis, diarrhea, urinary symptoms, rash, syncope, or light-headedness as associated.  Pt was seen here 6 days ago and treated for URI but symptoms have not improved.  Pt is a current everyday smoker and reports occasional alcohol use.   No PCP.    Past Medical History  Diagnosis Date  . Asthma     Past Surgical History  Procedure Date  . Cholecystectomy     Family History  Problem Relation Age of Onset  . Hypertension Mother   . Hypertension Father     History  Substance Use Topics  . Smoking status: Current Every Day Smoker -- 0.5 packs/day for 22 years    Types: Cigarettes  . Smokeless tobacco: Never Used  . Alcohol Use: Yes     Comment: occasionally    OB History    Grav Para Term Preterm Abortions TAB SAB Ect Mult Living   4 4 4       4       Review of Systems  Constitutional: Negative for fever.  HENT: Positive for congestion.   Respiratory: Positive for cough.   Cardiovascular: Negative for chest pain.  Gastrointestinal: Negative for nausea, vomiting and diarrhea.  Genitourinary: Negative for dysuria.  Musculoskeletal: Negative for myalgias.  Skin: Negative for rash.  Neurological: Positive for headaches.  All other systems reviewed and are  negative.    Allergies  Review of patient's allergies indicates no known allergies.  Home Medications   Current Outpatient Rx  Name  Route  Sig  Dispense  Refill  . ACETAMINOPHEN 500 MG PO TABS   Oral   Take 1,000 mg by mouth every 6 (six) hours as needed. Pain.         . ALBUTEROL SULFATE HFA 108 (90 BASE) MCG/ACT IN AERS   Inhalation   Inhale 1-2 puffs into the lungs every 6 (six) hours as needed for wheezing.   1 Inhaler   0   . DEXTROMETHORPHAN HBR 15 MG/5ML PO SYRP   Oral   Take 10 mLs by mouth 4 (four) times daily as needed. Cough/congestion.           BP 114/82  Pulse 105  Temp 97.6 F (36.4 C) (Oral)  Resp 18  Ht 5\' 4"  (1.626 m)  Wt 260 lb (117.935 kg)  BMI 44.63 kg/m2  SpO2 96%  LMP 05/24/2012  Physical Exam  Nursing note and vitals reviewed. Constitutional: She is oriented to person, place, and time. She appears well-developed and well-nourished.  HENT:  Head: Normocephalic and atraumatic.       Bilateral TM normal. Pharynx is erythematous.  No exudate.  Eyes: Conjunctivae normal and EOM are normal. Pupils are equal, round, and reactive  to light.  Neck: Normal range of motion. Neck supple.  Cardiovascular: Normal rate, regular rhythm and normal heart sounds.   Pulmonary/Chest: Effort normal and breath sounds normal.       Scattered wheezes over both fields.  Abdominal: Soft. Bowel sounds are normal.  Musculoskeletal: Normal range of motion. She exhibits no edema.  Lymphadenopathy:    She has no cervical adenopathy.  Neurological: She is alert and oriented to person, place, and time.  Skin: Skin is warm and dry.  Psychiatric: She has a normal mood and affect.    ED Course  Procedures (including critical care time) DIAGNOSTIC STUDIES: Oxygen Saturation is 96% on room air, adequate by my interpretation.    COORDINATION OF CARE: 1:51 PM- Patient informed of clinical course, understands medical decision-making process, and agrees with plan.     Rx with prednisone taper, Z-pak, Robitussin AC, and hydrocodone-acetaminophen for pain.       1. Asthmatic bronchitis     I personally performed the services described in this documentation, which was scribed in my presence. The recorded information has been reviewed and considered.  Osvaldo Human, M.D.     Carleene Cooper III, MD 06/09/12 1045

## 2012-06-08 NOTE — ED Notes (Signed)
Coughing, congestion, clear sputum, seen here  For same sx , but no better. Headache

## 2012-07-27 ENCOUNTER — Encounter (HOSPITAL_COMMUNITY): Payer: Self-pay | Admitting: Emergency Medicine

## 2012-07-27 ENCOUNTER — Emergency Department (HOSPITAL_COMMUNITY)
Admission: EM | Admit: 2012-07-27 | Discharge: 2012-07-27 | Disposition: A | Discharge status: Home / Self Care | Payer: Self-pay | Attending: Emergency Medicine | Admitting: Emergency Medicine

## 2012-07-27 DIAGNOSIS — J45909 Unspecified asthma, uncomplicated: Secondary | ICD-10-CM | POA: Insufficient documentation

## 2012-07-27 DIAGNOSIS — F17200 Nicotine dependence, unspecified, uncomplicated: Secondary | ICD-10-CM | POA: Insufficient documentation

## 2012-07-27 DIAGNOSIS — Z79899 Other long term (current) drug therapy: Secondary | ICD-10-CM | POA: Insufficient documentation

## 2012-07-27 DIAGNOSIS — H53149 Visual discomfort, unspecified: Secondary | ICD-10-CM | POA: Insufficient documentation

## 2012-07-27 DIAGNOSIS — H5710 Ocular pain, unspecified eye: Secondary | ICD-10-CM | POA: Insufficient documentation

## 2012-07-27 MED ORDER — KETOROLAC TROMETHAMINE 0.5 % OP SOLN
1.0000 [drp] | Freq: Once | OPHTHALMIC | Status: AC
  Administered 2012-07-27: 1 [drp] via OPHTHALMIC
  Filled 2012-07-27: qty 5

## 2012-07-27 MED ORDER — TETRACAINE HCL 0.5 % OP SOLN
2.0000 [drp] | Freq: Once | OPHTHALMIC | Status: AC
  Administered 2012-07-27: 2 [drp] via OPHTHALMIC

## 2012-07-27 NOTE — ED Notes (Signed)
Patient complaining of left eye pain since yesterday. Also complaining of blurry vision in left eye. Denies injury. Denies drainage.

## 2012-07-29 NOTE — ED Provider Notes (Signed)
Medical screening examination/treatment/procedure(s) were conducted as a shared visit with non-physician practitioner(s) and myself.  I personally evaluated the patient during the encounter.  Gross eye exam normal. Follow up ophthalmology  [name], MD 07/29/12 2352

## 2012-07-29 NOTE — ED Provider Notes (Signed)
History     CSN: [id number]  Arrival date & time 07/27/12  2038   First MD Initiated Contact with Patient 07/27/12 2128      Chief Complaint  Patient presents with  . Eye Pain    (Consider location/radiation/quality/duration/timing/severity/associated sxs/prior treatment) HPI Comments: Patient c/o gradual onset of irritation and pain to her left eye.  Describes a "dull pain" behind her left eye.  Also c/o blurred vision in the affected eye.  Reports some sinus congestion.  She denies trauma, discharge, recent illness, fever, neck pain, headache, dizziness, or vomiting. Patient states she does NOT wear contacts or eyeglasses.  Patient is a 36 y.o. female presenting with eye pain. The history is provided by the patient.  Eye Pain This is a new problem. The current episode started yesterday. The problem occurs constantly. The problem has been unchanged. Associated symptoms include congestion and a visual change. Pertinent negatives include no arthralgias, chest pain, fatigue, fever, headaches, joint swelling, nausea, neck pain, numbness, rash, swollen glands, urinary symptoms, vertigo, vomiting or weakness. Exacerbated by: bright lights and blinking. She has tried nothing for the symptoms. The treatment provided no relief.    Past Medical History  Diagnosis Date  . Asthma     Past Surgical History  Procedure Date  . Cholecystectomy     Family History  Problem Relation Age of Onset  . Hypertension Mother   . Hypertension Father     History  Substance Use Topics  . Smoking status: Current Every Day Smoker -- 0.5 packs/day for 22 years    Types: Cigarettes  . Smokeless tobacco: Never Used  . Alcohol Use: Yes     Comment: occasionally    OB History    Grav Para Term Preterm Abortions TAB SAB Ect Mult Living   4 4 4       4       Review of Systems  Constitutional: Negative for fever, activity change, appetite change and fatigue.  HENT: Positive for congestion. Negative  for ear pain, facial swelling, rhinorrhea, neck pain and neck stiffness.   Eyes: Positive for photophobia, pain, itching and visual disturbance. Negative for discharge and redness.  Cardiovascular: Negative for chest pain.  Gastrointestinal: Negative for nausea and vomiting.  Musculoskeletal: Negative for joint swelling and arthralgias.  Skin: Negative for rash.  Neurological: Negative for dizziness, vertigo, facial asymmetry, weakness, light-headedness, numbness and headaches.  Hematological: Negative for adenopathy.  All other systems reviewed and are negative.    Allergies  Review of patient's allergies indicates no known allergies.  Home Medications   Current Outpatient Rx  Name  Route  Sig  Dispense  Refill  . ACETAMINOPHEN 500 MG PO TABS   Oral   Take 1,000 mg by mouth every 6 (six) hours as needed. Pain.         . ALBUTEROL SULFATE HFA 108 (90 BASE) MCG/ACT IN AERS   Inhalation   Inhale 1-2 puffs into the lungs every 6 (six) hours as needed for wheezing.   1 Inhaler   0   . AZITHROMYCIN 250 MG PO TABS      Take one tablet per day for the next 4 days.   4 tablet   0   . DEXTROMETHORPHAN HBR 15 MG/5ML PO SYRP   Oral   Take 10 mLs by mouth 4 (four) times daily as needed. Cough/congestion.         . GUAIFENESIN-CODEINE 100-10 MG/5ML PO SYRP   Oral   Take 10  mLs by mouth 3 (three) times daily as needed for cough.   120 mL   0   . HYDROCODONE-ACETAMINOPHEN 5-325 MG PO TABS   Oral   Take 1 tablet by mouth every 4 (four) hours as needed for pain.   12 tablet   0   . PREDNISONE 20 MG PO TABS      Take 3 pills per day for 2 days, then 2 pills per day for 2 days, then 1 pill per day for 2 days.   12 tablet   0     BP 120/59  Pulse 81  Temp 98 F (36.7 C) (Oral)  Resp 16  Ht 5\' 4"  (1.626 m)  Wt 260 lb (117.935 kg)  BMI 44.63 kg/m2  SpO2 96%  LMP 07/20/2012  Physical Exam  Nursing note and vitals reviewed. Constitutional: She is oriented to  person, place, and time. She appears well-developed and well-nourished. No distress.  HENT:  Head: Normocephalic and atraumatic.  Eyes: Conjunctivae normal and EOM are normal. Pupils are equal, round, and reactive to light. Right eye exhibits no discharge. Left eye exhibits no discharge, no exudate and no hordeolum. No foreign body present in the left eye. No scleral icterus. Left eye exhibits normal extraocular motion and no nystagmus.  Fundoscopic exam:      The left eye shows no exudate and no papilledema.  Slit lamp exam:      The left eye shows no corneal abrasion, no corneal flare, no corneal ulcer, no foreign body, no hyphema, no hypopyon and no fluorescein uptake.  Neck: Normal range of motion. Neck supple. No thyromegaly present.  Cardiovascular: Normal rate, regular rhythm, normal heart sounds and intact distal pulses.   No murmur heard. Pulmonary/Chest: Effort normal and breath sounds normal.  Musculoskeletal: Normal range of motion.  Lymphadenopathy:    She has no cervical adenopathy.  Neurological: She is alert and oriented to person, place, and time. She exhibits normal muscle tone. Coordination normal.  Skin: Skin is warm and dry.    ED Course  Procedures (including critical care time)  Labs Reviewed - No data to display No results found.   1. Eye pain       MDM    Visual acuity is wnml.  Patient feeling better.  Possible related to viral illness.  Pt agrees to compresses and dispensed Ketoralac drops from ED.  She agrees to close f/u with ophthalmology.         Sutter Ahlgren L. Mc[place]ough, [place] 07/29/12 2147

## 2014-06-04 ENCOUNTER — Encounter (HOSPITAL_COMMUNITY): Payer: Self-pay | Admitting: Emergency Medicine

## 2017-01-21 ENCOUNTER — Emergency Department (HOSPITAL_COMMUNITY)
Admission: EM | Admit: 2017-01-21 | Discharge: 2017-01-21 | Disposition: A | Discharge status: Home / Self Care | Payer: Self-pay | Attending: Emergency Medicine | Admitting: Emergency Medicine

## 2017-01-21 ENCOUNTER — Encounter (HOSPITAL_COMMUNITY): Payer: Self-pay | Admitting: Cardiology

## 2017-01-21 DIAGNOSIS — J45909 Unspecified asthma, uncomplicated: Secondary | ICD-10-CM | POA: Insufficient documentation

## 2017-01-21 DIAGNOSIS — F17210 Nicotine dependence, cigarettes, uncomplicated: Secondary | ICD-10-CM | POA: Insufficient documentation

## 2017-01-21 DIAGNOSIS — E65 Localized adiposity: Secondary | ICD-10-CM

## 2017-01-21 DIAGNOSIS — Y939 Activity, unspecified: Secondary | ICD-10-CM | POA: Insufficient documentation

## 2017-01-21 DIAGNOSIS — Y33XXXA Other specified events, undetermined intent, initial encounter: Secondary | ICD-10-CM | POA: Insufficient documentation

## 2017-01-21 DIAGNOSIS — Y929 Unspecified place or not applicable: Secondary | ICD-10-CM | POA: Insufficient documentation

## 2017-01-21 DIAGNOSIS — Y999 Unspecified external cause status: Secondary | ICD-10-CM | POA: Insufficient documentation

## 2017-01-21 DIAGNOSIS — S46912A Strain of unspecified muscle, fascia and tendon at shoulder and upper arm level, left arm, initial encounter: Secondary | ICD-10-CM | POA: Insufficient documentation

## 2017-01-21 HISTORY — DX: Gastric ulcer, unspecified as acute or chronic, without hemorrhage or perforation: K25.9

## 2017-01-21 NOTE — Discharge Instructions (Signed)
The examination of your left shoulder suggest a fatty cyst, or fat-pad present. You have good range of motion of her shoulder. There no neurological or vascular deficits appreciated. If this issue becomes more prominent, please discuss it with your primary physician. Return to the emergency department if any emergent changes, problems, or concerns.

## 2017-01-21 NOTE — ED Triage Notes (Signed)
Left shoulder pain and swelling times 2 weeks.

## 2017-01-22 NOTE — ED Provider Notes (Signed)
[name] DEPT Provider Note   CSN: [id number] Arrival date & time: 01/21/17  1447     History   Chief Complaint Chief Complaint  Patient presents with  . Shoulder Pain    HPI Linda Ellis is a 41 y.o. female.  Patient is a 41 year old female presents to the emergency department with complaint of left shoulder pain, and swelling on the left side.  The patient states that she has been having some left shoulder pain off and on for some time. The last 2 weeks this has gotten worse. She has pain when she raises her arm above her head. She has pain with lifting certain objects. She does not recall any injury or trauma. She's not had any operations or procedures involving the left shoulder.  The patient also notices prominent area between her shoulder blade in her neck that she says is swollen. She thinks that she may be recently pregnant and she is concerned if this raised area is an indication of some serious problem. It does not hurt, does not interfere with her activities of daily living, but she wanted to have checked.   The history is provided by the patient.  Shoulder Pain   Pertinent negatives include no numbness.    Past Medical History:  Diagnosis Date  . Asthma   . Multiple gastric ulcers     There are no active problems to display for this patient.   Past Surgical History:  Procedure Laterality Date  . CHOLECYSTECTOMY      OB History    Gravida Para Term Preterm AB Living   5 4 4     4    SAB TAB Ectopic Multiple Live Births                   Home Medications    Prior to Admission medications   Medication Sig Start Date End Date Taking? Authorizing Provider  acetaminophen (TYLENOL) 500 MG tablet Take 1,000 mg by mouth every 6 (six) hours as needed. Pain.    [provider]  albuterol (PROVENTIL HFA;VENTOLIN HFA) 108 (90 BASE) MCG/ACT inhaler Inhale 1-2 puffs into the lungs every 6 (six) hours as needed for wheezing. 06/02/12   Fredia Sorrow, MD  azithromycin (ZITHROMAX Z-PAK) 250 MG tablet Take one tablet per day for the next 4 days. 06/08/12   Mylinda Latina, MD  dextromethorphan 15 MG/5ML syrup Take 10 mLs by mouth 4 (four) times daily as needed. Cough/congestion.    [provider]  guaiFENesin-codeine (ROBITUSSIN AC) 100-10 MG/5ML syrup Take 10 mLs by mouth 3 (three) times daily as needed for cough. 06/08/12   Mylinda Latina, MD  HYDROcodone-acetaminophen (NORCO/VICODIN) 5-325 MG per tablet Take 1 tablet by mouth every 4 (four) hours as needed for pain. 06/08/12   Mylinda Latina, MD  predniSONE (DELTASONE) 20 MG tablet Take 3 pills per day for 2 days, then 2 pills per day for 2 days, then 1 pill per day for 2 days. 06/08/12   Mylinda Latina, MD    Family History Family History  Problem Relation Age of Onset  . Hypertension Mother   . Hypertension Father     Social History Social History  Substance Use Topics  . Smoking status: Current Every Day Smoker    Packs/day: 0.50    Years: 22.00    Types: Cigarettes  . Smokeless tobacco: Never Used  . Alcohol use Yes     Comment: occasionally     Allergies   Patient  has no known allergies.   Review of Systems Review of Systems  Constitutional: Negative for activity change and appetite change.  HENT: Negative for congestion, ear discharge, ear pain, facial swelling, nosebleeds, rhinorrhea, sneezing and tinnitus.   Eyes: Negative for photophobia, pain and discharge.  Respiratory: Negative for cough, choking, shortness of breath and wheezing.   Cardiovascular: Negative for chest pain, palpitations and leg swelling.  Gastrointestinal: Negative for abdominal pain, blood in stool, constipation, diarrhea, nausea and vomiting.  Genitourinary: Negative for difficulty urinating, dysuria, flank pain, frequency and hematuria.  Musculoskeletal: Positive for arthralgias. Negative for back pain, gait problem, myalgias and neck pain.       Left shoulder pain  Skin:  Negative for color change, rash and wound.  Neurological: Negative for dizziness, seizures, syncope, facial asymmetry, speech difficulty, weakness and numbness.  Hematological: Negative for adenopathy. Does not bruise/bleed easily.  Psychiatric/Behavioral: Negative for agitation, confusion, hallucinations, self-injury and suicidal ideas. The patient is not nervous/anxious.      Physical Exam Updated Vital Signs BP 129/75 (BP Location: Right Arm)   Pulse (!) 109   Temp 98.4 F (36.9 C) (Oral)   Resp 18   Ht 5\' 4"  (1.626 m)   Wt 127 kg (280 lb)   LMP 12/09/2016   SpO2 97%   BMI 48.06 kg/m   Physical Exam  Constitutional: Vital signs are normal. She appears well-developed and well-nourished. She is active.  HENT:  Head: Normocephalic and atraumatic.  Right Ear: Tympanic membrane, external ear and ear canal normal.  Left Ear: Tympanic membrane, external ear and ear canal normal.  Nose: Nose normal.  Mouth/Throat: Uvula is midline, oropharynx is clear and moist and mucous membranes are normal.  Eyes: Conjunctivae, EOM and lids are normal. Pupils are equal, round, and reactive to light.  Neck: Trachea normal, normal range of motion and phonation normal. Neck supple. Carotid bruit is not present.  Cardiovascular: Normal rate, regular rhythm and normal pulses.   Abdominal: Soft. Normal appearance and bowel sounds are normal.  Musculoskeletal:       Left shoulder: She exhibits tenderness.       Arms: There is an area under the clavicle extending to the humeral area that is sore with range of motion. There's no deformity appreciated. No evidence of dislocation. No hot joints appreciated. Radial pulses 2+ bilaterally. There is full range of motion of the left elbow, wrist, and fingers.  Lymphadenopathy:       Head (right side): No submental, no preauricular and no posterior auricular adenopathy present.       Head (left side): No submental, no preauricular and no posterior auricular  adenopathy present.    She has no cervical adenopathy.  Neurological: She is alert. She has normal strength. No cranial nerve deficit or sensory deficit. GCS eye subscore is 4. GCS verbal subscore is 5. GCS motor subscore is 6.  Skin: Skin is warm and dry.  Psychiatric: Her speech is normal.     ED Treatments / Results  Labs (all labs ordered are listed, but only abnormal results are displayed) Labs Reviewed - No data to display  EKG  EKG Interpretation None       Radiology No results found.  Procedures Procedures (including critical care time)  Medications Ordered in ED Medications - No data to display   Initial Impression / Assessment and Plan / ED Course  I have reviewed the triage vital signs and the nursing notes.  Pertinent labs & imaging results that  were available during my care of the patient were reviewed by me and considered in my medical decision making (see chart for details).       Final Clinical Impressions(s) / ED Diagnoses MDM Heart rate is 109 initially. Remainder the vital signs are within normal limits. There is full range of motion of the left neck and shoulder. There no hot areas appreciated. No history of trauma. No neurovascular deficits appreciated. I suspect that the patient has a shoulder strain. Also suspect that the raised area is probably a fat pad or fatty cyst. I've instructed the patient to observe this closely and to speak with general surgery if this continues to be an issue or if it causes her problems. The patient is not had any unusual weight loss or weight gain. She has not had any diagnosis related to her thyroid, this been no difficulty with breathing or coughing up blood or any lung related illness. I feel that this is probably a fat pad or a fatty cyst. Patient will return to the emergency department if any emergent changes, problems, or concerns.    Final diagnoses:  Strain of left shoulder, initial encounter  Fat pad    New  Prescriptions Discharge Medication List as of 01/21/2017  4:34 PM       Lily Kocher, PA-C 01/22/17 6429    Nat Christen, MD 01/23/17 [phone]

## 2017-01-24 ENCOUNTER — Encounter (HOSPITAL_COMMUNITY)
Admission: EM | Disposition: A | Discharge status: Home / Self Care | Payer: Self-pay | Source: Home / Self Care | Attending: Emergency Medicine

## 2017-01-24 ENCOUNTER — Emergency Department (HOSPITAL_COMMUNITY): Payer: Self-pay

## 2017-01-24 ENCOUNTER — Other Ambulatory Visit: Payer: Self-pay | Admitting: Obstetrics & Gynecology

## 2017-01-24 ENCOUNTER — Emergency Department (HOSPITAL_COMMUNITY)
Admission: EM | Admit: 2017-01-24 | Discharge: 2017-01-24 | Disposition: A | Discharge status: Home / Self Care | Payer: Self-pay | Attending: Emergency Medicine | Admitting: Emergency Medicine

## 2017-01-24 ENCOUNTER — Emergency Department (HOSPITAL_COMMUNITY): Payer: Self-pay | Admitting: Anesthesiology

## 2017-01-24 ENCOUNTER — Encounter (HOSPITAL_COMMUNITY): Payer: Self-pay

## 2017-01-24 DIAGNOSIS — Z79899 Other long term (current) drug therapy: Secondary | ICD-10-CM | POA: Insufficient documentation

## 2017-01-24 DIAGNOSIS — O00101 Right tubal pregnancy without intrauterine pregnancy: Secondary | ICD-10-CM

## 2017-01-24 DIAGNOSIS — A599 Trichomoniasis, unspecified: Secondary | ICD-10-CM

## 2017-01-24 DIAGNOSIS — F17210 Nicotine dependence, cigarettes, uncomplicated: Secondary | ICD-10-CM | POA: Insufficient documentation

## 2017-01-24 DIAGNOSIS — O26891 Other specified pregnancy related conditions, first trimester: Secondary | ICD-10-CM | POA: Insufficient documentation

## 2017-01-24 DIAGNOSIS — O99332 Smoking (tobacco) complicating pregnancy, second trimester: Secondary | ICD-10-CM | POA: Insufficient documentation

## 2017-01-24 DIAGNOSIS — O00201 Right ovarian pregnancy without intrauterine pregnancy: Secondary | ICD-10-CM

## 2017-01-24 DIAGNOSIS — J45909 Unspecified asthma, uncomplicated: Secondary | ICD-10-CM | POA: Insufficient documentation

## 2017-01-24 HISTORY — PX: LAPAROSCOPIC UNILATERAL SALPINGECTOMY: SHX5934

## 2017-01-24 HISTORY — PX: DIAGNOSTIC LAPAROSCOPY WITH REMOVAL OF ECTOPIC PREGNANCY: SHX6449

## 2017-01-24 LAB — HCG, QUANTITATIVE, PREGNANCY: HCG, BETA CHAIN, QUANT, S: 9142 m[IU]/mL — AB (ref <5)

## 2017-01-24 LAB — BASIC METABOLIC PANEL
Anion gap: 8 (ref 5–15)
BUN: 8 mg/dL (ref 6–20)
CHLORIDE: 104 mmol/L (ref 101–111)
CO2: 24 mmol/L (ref 22–32)
Calcium: 9.3 mg/dL (ref 8.9–10.3)
Creatinine, Ser: 0.8 mg/dL (ref 0.44–1.00)
GFR calc Af Amer: >60 mL/min (ref >60)
GFR calc non Af Amer: >60 mL/min (ref >60)
Glucose, Bld: 108 mg/dL — ABNORMAL HIGH (ref 65–99)
POTASSIUM: 3.8 mmol/L (ref 3.5–5.1)
SODIUM: 136 mmol/L (ref 135–145)

## 2017-01-24 LAB — CBC WITH DIFFERENTIAL/PLATELET
Basophils Absolute: 0.1 10*3/uL (ref 0.0–0.1)
Basophils Relative: 1 %
EOS ABS: 0.2 10*3/uL (ref 0.0–0.7)
Eosinophils Relative: 3 %
HCT: 39.3 % (ref 36.0–46.0)
HEMOGLOBIN: 13.5 g/dL (ref 12.0–15.0)
Lymphocytes Relative: 34 %
Lymphs Abs: 3.2 10*3/uL (ref 0.7–4.0)
MCH: 31.1 pg (ref 26.0–34.0)
MCHC: 34.4 g/dL (ref 30.0–36.0)
MCV: 90.6 fL (ref 78.0–100.0)
Monocytes Absolute: 0.6 10*3/uL (ref 0.1–1.0)
Monocytes Relative: 6 %
NEUTROS PCT: 56 %
Neutro Abs: 5.4 10*3/uL (ref 1.7–7.7)
Platelets: 338 10*3/uL (ref 150–400)
RBC: 4.34 MIL/uL (ref 3.87–5.11)
RDW: 12.7 % (ref 11.5–15.5)
WBC: 9.5 10*3/uL (ref 4.0–10.5)

## 2017-01-24 LAB — URINALYSIS, ROUTINE W REFLEX MICROSCOPIC
BILIRUBIN URINE: NEGATIVE
Glucose, UA: NEGATIVE mg/dL
KETONES UR: NEGATIVE mg/dL
LEUKOCYTES UA: NEGATIVE
NITRITE: NEGATIVE
PH: 6 (ref 5.0–8.0)
Protein, ur: NEGATIVE mg/dL
Specific Gravity, Urine: 1.004 — ABNORMAL LOW (ref 1.005–1.030)

## 2017-01-24 LAB — WET PREP, GENITAL
Clue Cells Wet Prep HPF POC: NONE SEEN
SPERM: NEGATIVE
Yeast Wet Prep HPF POC: NONE SEEN

## 2017-01-24 LAB — ANTIBODY SCREEN: Antibody Screen: NEGATIVE

## 2017-01-24 LAB — ABO/RH: ABO/RH(D): O NEG

## 2017-01-24 LAB — I-STAT BETA HCG BLOOD, ED (MC, WL, AP ONLY)

## 2017-01-24 SURGERY — SALPINGECTOMY, UNILATERAL, LAPAROSCOPIC
Anesthesia: General | Site: Abdomen | Laterality: Right

## 2017-01-24 MED ORDER — CEFAZOLIN SODIUM-DEXTROSE 2-4 GM/100ML-% IV SOLN
INTRAVENOUS | Status: AC
  Filled 2017-01-24: qty 100

## 2017-01-24 MED ORDER — GLYCOPYRROLATE 0.2 MG/ML IJ SOLN
INTRAMUSCULAR | Status: AC
  Filled 2017-01-24: qty 6

## 2017-01-24 MED ORDER — ONDANSETRON 8 MG PO TBDP
8.0000 mg | ORAL_TABLET | Freq: Three times a day (TID) | ORAL | 0 refills | Status: DC | PRN
Start: 2017-01-24 — End: 2017-05-23

## 2017-01-24 MED ORDER — GLYCOPYRROLATE 0.2 MG/ML IJ SOLN
INTRAMUSCULAR | Status: DC | PRN
  Administered 2017-01-24: 0.2 mg via INTRAVENOUS
  Administered 2017-01-24: 0.6 mg via INTRAVENOUS

## 2017-01-24 MED ORDER — GLYCOPYRROLATE 0.2 MG/ML IJ SOLN
INTRAMUSCULAR | Status: AC
  Filled 2017-01-24: qty 1

## 2017-01-24 MED ORDER — ALBUTEROL SULFATE HFA 108 (90 BASE) MCG/ACT IN AERS
INHALATION_SPRAY | RESPIRATORY_TRACT | Status: DC | PRN
  Administered 2017-01-24: 3 via RESPIRATORY_TRACT

## 2017-01-24 MED ORDER — MIDAZOLAM HCL 5 MG/5ML IJ SOLN
INTRAMUSCULAR | Status: DC | PRN
  Administered 2017-01-24: 2 mg via INTRAVENOUS

## 2017-01-24 MED ORDER — RHO D IMMUNE GLOBULIN 1500 UNIT/2ML IJ SOSY: 300.0000 ug | PREFILLED_SYRINGE | Freq: Once | INTRAMUSCULAR | 0 refills | Status: AC

## 2017-01-24 MED ORDER — ROCURONIUM BROMIDE 50 MG/5ML IV SOLN
INTRAVENOUS | Status: AC
  Filled 2017-01-24: qty 1

## 2017-01-24 MED ORDER — LIDOCAINE HCL (CARDIAC) 20 MG/ML IV SOLN
INTRAVENOUS | Status: DC | PRN
  Administered 2017-01-24: 50 mg via INTRAVENOUS

## 2017-01-24 MED ORDER — ONDANSETRON HCL 4 MG/2ML IJ SOLN
INTRAMUSCULAR | Status: DC | PRN
  Administered 2017-01-24: 4 mg via INTRAVENOUS

## 2017-01-24 MED ORDER — NEOSTIGMINE METHYLSULFATE 10 MG/10ML IV SOLN
INTRAVENOUS | Status: AC
  Filled 2017-01-24: qty 1

## 2017-01-24 MED ORDER — KETOROLAC TROMETHAMINE 30 MG/ML IJ SOLN
30.0000 mg | Freq: Once | INTRAMUSCULAR | Status: AC
  Administered 2017-01-24: 30 mg via INTRAVENOUS
  Filled 2017-01-24: qty 1

## 2017-01-24 MED ORDER — ROCURONIUM BROMIDE 100 MG/10ML IV SOLN
INTRAVENOUS | Status: DC | PRN
  Administered 2017-01-24: 25 mg via INTRAVENOUS

## 2017-01-24 MED ORDER — SUCCINYLCHOLINE CHLORIDE 20 MG/ML IJ SOLN
INTRAMUSCULAR | Status: AC
  Filled 2017-01-24: qty 1

## 2017-01-24 MED ORDER — RHO D IMMUNE GLOBULIN 1500 UNIT/2ML IJ SOSY
300.0000 ug | PREFILLED_SYRINGE | Freq: Once | INTRAMUSCULAR | Status: AC
  Administered 2017-01-24: 300 ug via INTRAMUSCULAR
  Filled 2017-01-24: qty 2

## 2017-01-24 MED ORDER — MIDAZOLAM HCL 2 MG/2ML IJ SOLN
INTRAMUSCULAR | Status: AC
  Filled 2017-01-24: qty 2

## 2017-01-24 MED ORDER — PHENYLEPHRINE 40 MCG/ML (10ML) SYRINGE FOR IV PUSH (FOR BLOOD PRESSURE SUPPORT)
PREFILLED_SYRINGE | INTRAVENOUS | Status: AC
  Filled 2017-01-24: qty 10

## 2017-01-24 MED ORDER — BUPIVACAINE LIPOSOME 1.3 % IJ SUSP
INTRAMUSCULAR | Status: DC | PRN
  Administered 2017-01-24: 20 mL

## 2017-01-24 MED ORDER — PROPOFOL 10 MG/ML IV BOLUS
INTRAVENOUS | Status: DC | PRN
Start: 2017-01-24 — End: 2017-01-24
  Administered 2017-01-24: 160 mg via INTRAVENOUS

## 2017-01-24 MED ORDER — NEOSTIGMINE METHYLSULFATE 10 MG/10ML IV SOLN
INTRAVENOUS | Status: DC | PRN
  Administered 2017-01-24: 4 mg via INTRAVENOUS

## 2017-01-24 MED ORDER — FENTANYL CITRATE (PF) 100 MCG/2ML IJ SOLN
INTRAMUSCULAR | Status: DC | PRN
  Administered 2017-01-24 (×5): 50 ug via INTRAVENOUS

## 2017-01-24 MED ORDER — FENTANYL CITRATE (PF) 250 MCG/5ML IJ SOLN
INTRAMUSCULAR | Status: AC
  Filled 2017-01-24: qty 5

## 2017-01-24 MED ORDER — BUPIVACAINE LIPOSOME 1.3 % IJ SUSP
INTRAMUSCULAR | Status: AC
  Filled 2017-01-24: qty 20

## 2017-01-24 MED ORDER — DEXTROSE 5 % IV SOLN
3.0000 g | INTRAVENOUS | Status: AC
  Administered 2017-01-24: 3 g via INTRAVENOUS
  Filled 2017-01-24: qty 3000

## 2017-01-24 MED ORDER — SUCCINYLCHOLINE CHLORIDE 20 MG/ML IJ SOLN
INTRAMUSCULAR | Status: DC | PRN
  Administered 2017-01-24: 160 mg via INTRAVENOUS

## 2017-01-24 MED ORDER — DEXAMETHASONE SODIUM PHOSPHATE 4 MG/ML IJ SOLN
INTRAMUSCULAR | Status: AC
  Filled 2017-01-24: qty 1

## 2017-01-24 MED ORDER — OXYCODONE-ACETAMINOPHEN 5-325 MG PO TABS: 1.0000 | ORAL_TABLET | Freq: Four times a day (QID) | ORAL | 0 refills | Status: DC | PRN

## 2017-01-24 MED ORDER — DEXAMETHASONE SODIUM PHOSPHATE 4 MG/ML IJ SOLN
INTRAMUSCULAR | Status: DC | PRN
  Administered 2017-01-24: 4 mg via INTRAVENOUS

## 2017-01-24 MED ORDER — ONDANSETRON HCL 4 MG/2ML IJ SOLN
INTRAMUSCULAR | Status: AC
  Filled 2017-01-24: qty 2

## 2017-01-24 MED ORDER — 0.9 % SODIUM CHLORIDE (POUR BTL) OPTIME
TOPICAL | Status: DC | PRN
  Administered 2017-01-24: 1000 mL

## 2017-01-24 MED ORDER — BUPIVACAINE LIPOSOME 1.3 % IJ SUSP
20.0000 mL | Freq: Once | INTRAMUSCULAR | Status: DC
  Filled 2017-01-24: qty 20

## 2017-01-24 MED ORDER — LACTATED RINGERS IV SOLN
INTRAVENOUS | Status: DC | PRN
  Administered 2017-01-24 (×2): via INTRAVENOUS

## 2017-01-24 MED ORDER — PROPOFOL 10 MG/ML IV BOLUS
INTRAVENOUS | Status: AC
  Filled 2017-01-24: qty 40

## 2017-01-24 MED ORDER — CEFAZOLIN SODIUM-DEXTROSE 1-4 GM/50ML-% IV SOLN
INTRAVENOUS | Status: AC
  Filled 2017-01-24: qty 50

## 2017-01-24 MED ORDER — LIDOCAINE HCL (PF) 1 % IJ SOLN
INTRAMUSCULAR | Status: AC
  Filled 2017-01-24: qty 5

## 2017-01-24 MED ORDER — KETOROLAC TROMETHAMINE 10 MG PO TABS: 10.0000 mg | ORAL_TABLET | Freq: Three times a day (TID) | ORAL | 0 refills | Status: DC | PRN

## 2017-01-24 SURGICAL SUPPLY — 50 items
BAG HAMPER (MISCELLANEOUS) ×3 IMPLANT
BAG RETRIEVAL 10 (BASKET) ×1
BAG RETRIEVAL 10MM (BASKET) ×1
BLADE 11 SAFETY STRL DISP (BLADE) ×3 IMPLANT
CLOTH BEACON ORANGE TIMEOUT ST (SAFETY) ×3 IMPLANT
COVER LIGHT HANDLE STERIS (MISCELLANEOUS) ×6 IMPLANT
DRAPE PROXIMA HALF (DRAPES) ×3 IMPLANT
DRESSING OPSITE X SMALL 2X3 (GAUZE/BANDAGES/DRESSINGS) ×3 IMPLANT
ELECT REM PT RETURN 9FT ADLT (ELECTROSURGICAL) ×3
ELECTRODE REM PT RTRN 9FT ADLT (ELECTROSURGICAL) ×1 IMPLANT
FILTER SMOKE EVAC LAPAROSHD (FILTER) ×3 IMPLANT
FORMALIN 10 PREFIL 480ML (MISCELLANEOUS) ×3 IMPLANT
GLOVE BIO SURGEON STRL SZ7 (GLOVE) ×3 IMPLANT
GLOVE BIOGEL PI IND STRL 7.0 (GLOVE) ×3 IMPLANT
GLOVE BIOGEL PI IND STRL 8 (GLOVE) ×1 IMPLANT
GLOVE BIOGEL PI INDICATOR 7.0 (GLOVE) ×6
GLOVE BIOGEL PI INDICATOR 8 (GLOVE) ×2
GLOVE ECLIPSE 8.0 STRL XLNG CF (GLOVE) ×3 IMPLANT
GLOVE EXAM NITRILE MD LF STRL (GLOVE) ×3 IMPLANT
GOWN STRL REUS W/TWL LRG LVL3 (GOWN DISPOSABLE) ×6 IMPLANT
GOWN STRL REUS W/TWL XL LVL3 (GOWN DISPOSABLE) ×3 IMPLANT
INST SET LAPROSCOPIC GYN AP (KITS) ×3 IMPLANT
KIT ROOM TURNOVER APOR (KITS) ×3 IMPLANT
MANIFOLD NEPTUNE II (INSTRUMENTS) ×3 IMPLANT
NEEDLE HYPO 18GX1.5 BLUNT FILL (NEEDLE) ×3 IMPLANT
NEEDLE HYPO 25X1 1.5 SAFETY (NEEDLE) ×3 IMPLANT
NEEDLE INSUFFLATION 120MM (ENDOMECHANICALS) ×3 IMPLANT
PACK PERI GYN (CUSTOM PROCEDURE TRAY) ×3 IMPLANT
PAD ARMBOARD 7.5X6 YLW CONV (MISCELLANEOUS) ×3 IMPLANT
SET BASIN LINEN APH (SET/KITS/TRAYS/PACK) ×3 IMPLANT
SET TUBE IRRIG SUCTION NO TIP (IRRIGATION / IRRIGATOR) ×3 IMPLANT
SHEARS HARMONIC ACE PLUS 36CM (ENDOMECHANICALS) ×3 IMPLANT
SLEEVE ENDOPATH XCEL 5M (ENDOMECHANICALS) ×3 IMPLANT
SOLUTION ANTI FOG 6CC (MISCELLANEOUS) ×3 IMPLANT
SPONGE GAUZE 2X2 8PLY STER LF (GAUZE/BANDAGES/DRESSINGS) ×3
SPONGE GAUZE 2X2 8PLY STRL LF (GAUZE/BANDAGES/DRESSINGS) ×6 IMPLANT
STAPLER VISISTAT 35W (STAPLE) ×3 IMPLANT
SUT VICRYL 0 UR6 27IN ABS (SUTURE) ×3 IMPLANT
SYR 20CC LL (SYRINGE) ×3 IMPLANT
SYRINGE 10CC LL (SYRINGE) ×3 IMPLANT
SYS BAG RETRIEVAL 10MM (BASKET) ×1
SYSTEM BAG RETRIEVAL 10MM (BASKET) ×1 IMPLANT
TAPE CLOTH SURG 4X10 WHT LF (GAUZE/BANDAGES/DRESSINGS) ×3 IMPLANT
TRAY FOLEY CATH SILVER 16FR (SET/KITS/TRAYS/PACK) ×3 IMPLANT
TROCAR ENDO BLADELESS 11MM (ENDOMECHANICALS) ×3 IMPLANT
TROCAR XCEL NON-BLD 11X100MML (ENDOMECHANICALS) ×3 IMPLANT
TROCAR XCEL NON-BLD 5MMX100MML (ENDOMECHANICALS) ×3 IMPLANT
TUBING HI FLO HEAT INSUFFLATOR (IRRIGATION / IRRIGATOR) ×3 IMPLANT
TUBING INSUF HEATED (TUBING) ×3 IMPLANT
WARMER LAPAROSCOPE (MISCELLANEOUS) ×3 IMPLANT

## 2017-01-24 NOTE — Transfer of Care (Signed)
Immediate Anesthesia Transfer of Care Note  Patient: Linda Ellis  Procedure(s) Performed: Procedure(s): LAPAROSCOPIC UNILATERAL SALPINGECTOMY (Right) REMOVAL OF ECTOPIC PREGNANCY (Right)  Patient Location: PACU  Anesthesia Type:General  Level of Consciousness: awake, oriented and patient cooperative  Airway & Oxygen Therapy: Patient Spontanous Breathing and Patient connected to nasal cannula oxygen  Post-op Assessment: Report given to RN and Post -op Vital signs reviewed and stable  Post vital signs: Reviewed and stable  Last Vitals:  Vitals:   01/24/17 0045 01/24/17 0616  BP: (!) 128/92 (!) 117/54  Pulse: 96 75  Resp: 18 18  Temp: 36.7 C 36.8 C    Last Pain:  Vitals:   01/24/17 0616  TempSrc: Oral  PainSc: 2       Patients Stated Pain Goal: 2 (89/78/47 8412)  Complications: No apparent anesthesia complications

## 2017-01-24 NOTE — Anesthesia Preprocedure Evaluation (Signed)
Anesthesia Evaluation  Patient identified by MRN, date of birth, ID band Patient awake    Airway Mallampati: II  TM Distance: >3 FB Neck ROM: Full    Dental  (+) Poor Dentition,    Pulmonary asthma , Current Smoker,     + decreased breath sounds      Cardiovascular  Rhythm:Regular     Neuro/Psych    GI/Hepatic PUD,   Endo/Other    Renal/GU      Musculoskeletal   Abdominal Normal abdominal exam  (+)   Peds  Hematology   Anesthesia Other Findings   Reproductive/Obstetrics                             Anesthesia Physical Anesthesia Plan  ASA: III and emergent  Anesthesia Plan: General   Post-op Pain Management:    Induction: Rapid sequence, Cricoid pressure planned and Intravenous  PONV Risk Score and Plan: Ondansetron, Dexamethasone, Propofol and Midazolam  Airway Management Planned: Oral ETT  Additional Equipment:   Intra-op Plan:   Post-operative Plan:   Informed Consent:   Plan Discussed with: Surgeon  Anesthesia Plan Comments:         Anesthesia Quick Evaluation

## 2017-01-24 NOTE — ED Triage Notes (Signed)
Pt woke on Friday morning bleeding "pretty heavy, not like a menstrual or as dark", then later on that day it was pick when she wiped.

## 2017-01-24 NOTE — Op Note (Signed)
Pre-Operative diagnosis: Right ectopic pregnancy, viable with positive fetal cardiac activity, unruptured                                              Quantitative hCG greater than 9000  Postoperative diagnosis: Same as above  Procedure:   laparoscopic salpingectomy for the removal of a right ectopic pregnancy  Surgeon: Florian Buff, MD  Anesthesia: Gen. Endotracheal  Findings:  Patient presented to the emergency department at [facility] complaining of right lower quadrant pain.  She had a positive pregnancy tests quantitative hCG of greater than 9000 and a sonogram which revealed a right fallopian tube ectopic pregnancy with a crown-rump length of 6 weeks 1 day a cardiac flicker and appeared to be unruptured.  Thus it was too large and too advanced to be treated with methotrexate and we proceeded with a laparoscopic removal  Description of operation:  Patient was taken to operating room placed in the supine position where she underwent general endotracheal anesthesia She was placed in low lithotomy position and prepped and draped in the usual sterile fashion A Foley catheter was placed An incision was made in the umbilicus and her abdominal wall fascia was grasped with 2 Coker clamps A Veres needle was placed into the peritoneal cavity with one pass that difficulty The peritoneal cavity was insufflated An 11 mm non-bladed disposable video trocar was used and placed into the peritoneal cavity under direct visualization with one pass without difficulty The peritoneal cavity was confirmed Incisions were made in the right and left lower quadrant of the abdomen 5 mm trochars were placed in both lower quadrants under direct visualization without difficulty The ectopic pregnancy was identified and was found to be unruptured The distal fallopian tube was grasped and the harmonic scalpel used and a salpingectomy performed, thus removing the ectopic pregnancy as well as the fallopian  tube The fallopian tube was in the proximal portion of the tube but certainly not interstitial There was no blood loss The tube and ectopic were removed with Endo Catch bag The harmonic scalpel pedicle was found to be hemostatic The 5 mm trocar were removed and there was no bleeding of the abdominal wall The umbilical trocar was removed and gas was allowed to escape The fascia was closed with single 0 Vicryl suture with good reapproximation The subcutaneous tissue was reapproximated with a single sutures well All 3 skin incisions were closed with skin staples X per L 20 cc total was injected in all 3 incisions, 10 cc in the umbilical incision and 5 cc each and the lower quadrant incisions The patient tolerated procedure well she experienced no blood loss and was taken to the recovery in good stable condition all counts are correct 3 She received 3 g of Ancef and 30 mg of Toradol IV perioperatively prophylactically  The patient will be discharged from the recovery room to be followed up in my office in a week or so for staple removal  Florian Buff, MD 01/24/2017 7:55 AM

## 2017-01-24 NOTE — ED Provider Notes (Signed)
[facility] DEPT Provider Note   CSN: [id number] Arrival date & time: 01/24/17  0031     History   Chief Complaint Chief Complaint  Patient presents with  . Miscarriage    HPI Linda Ellis is a 41 y.o. female.  Patient presents for evaluation of vaginal bleeding. Patient reports that she went to bed late last night. She noticed some intermittent sharp stabbing pains on her right side prior to going to bed that resolved. When she awoke in the morning, she noted that she had wetness on her underwear which was blood. Through the course of the day she has had decreasing amount of bleeding. She reports that it is now chest slightly pink when she wipes after urinating. She has not had any urinary frequency or dysuria. She feels diffuse lower pelvic cramping, "like menstrual cramps". Patient is concerned because she is 2 weeks overdue for her menstrual period, took a home pregnancy test last week that was positive.      Past Medical History:  Diagnosis Date  . Asthma   . Multiple gastric ulcers     There are no active problems to display for this patient.   Past Surgical History:  Procedure Laterality Date  . CHOLECYSTECTOMY      OB History    Gravida Para Term Preterm AB Living   5 4 4     4    SAB TAB Ectopic Multiple Live Births                   Home Medications    Prior to Admission medications   Medication Sig Start Date End Date Taking? Authorizing Provider  azithromycin (ZITHROMAX Z-PAK) 250 MG tablet Take one tablet per day for the next 4 days. 06/08/12  Yes Mylinda Latina, MD  beclomethasone (QVAR) 80 MCG/ACT inhaler Inhale 2 puffs into the lungs 2 (two) times daily.   Yes [provider]  guaiFENesin-codeine (ROBITUSSIN AC) 100-10 MG/5ML syrup Take 10 mLs by mouth 3 (three) times daily as needed for cough. 06/08/12  Yes Mylinda Latina, MD  HYDROcodone-acetaminophen (NORCO/VICODIN) 5-325 MG per tablet Take 1 tablet by mouth every 4 (four) hours as  needed for pain. 06/08/12  Yes Mylinda Latina, MD  predniSONE (DELTASONE) 20 MG tablet Take 3 pills per day for 2 days, then 2 pills per day for 2 days, then 1 pill per day for 2 days. 06/08/12  Yes Mylinda Latina, MD  ranitidine (ZANTAC) 150 MG tablet Take 150 mg by mouth 2 (two) times daily.   Yes [provider]  acetaminophen (TYLENOL) 500 MG tablet Take 1,000 mg by mouth every 6 (six) hours as needed. Pain.    [provider]  albuterol (PROVENTIL HFA;VENTOLIN HFA) 108 (90 BASE) MCG/ACT inhaler Inhale 1-2 puffs into the lungs every 6 (six) hours as needed for wheezing. 06/02/12   Fredia Sorrow, MD  dextromethorphan 15 MG/5ML syrup Take 10 mLs by mouth 4 (four) times daily as needed. Cough/congestion.    [provider]    Family History Family History  Problem Relation Age of Onset  . Hypertension Mother   . Hypertension Father     Social History Social History  Substance Use Topics  . Smoking status: Current Every Day Smoker    Packs/day: 0.50    Years: 22.00    Types: Cigarettes  . Smokeless tobacco: Never Used  . Alcohol use Yes     Comment: occasionally     Allergies   Patient has  no known allergies.   Review of Systems Review of Systems  Genitourinary: Positive for pelvic pain and vaginal bleeding.  All other systems reviewed and are negative.    Physical Exam Updated Vital Signs BP (!) 128/92 (BP Location: Right Arm)   Pulse 96   Temp 98.1 F (36.7 C) (Oral)   Resp 18   Wt 127 kg (280 lb)   LMP 12/09/2016   SpO2 100%   BMI 48.06 kg/m   Physical Exam  Constitutional: She is oriented to person, place, and time. She appears well-developed and well-nourished. No distress.  HENT:  Head: Normocephalic and atraumatic.  Right Ear: Hearing normal.  Left Ear: Hearing normal.  Nose: Nose normal.  Mouth/Throat: Oropharynx is clear and moist and mucous membranes are normal.  Eyes: Conjunctivae and EOM are normal. Pupils are equal,  round, and reactive to light.  Neck: Normal range of motion. Neck supple.  Cardiovascular: Regular rhythm, S1 normal and S2 normal.  Exam reveals no gallop and no friction rub.   No murmur heard. Pulmonary/Chest: Effort normal and breath sounds normal. No respiratory distress. She exhibits no tenderness.  Abdominal: Soft. Normal appearance and bowel sounds are normal. There is no hepatosplenomegaly. There is no tenderness. There is no rebound, no guarding, no tenderness at McBurney's point and negative Murphy's sign. No hernia.  Genitourinary: Uterus is tender. Cervix exhibits no motion tenderness. Right adnexum displays no mass and no tenderness. Left adnexum displays no mass and no tenderness.  Genitourinary Comments: Brownish blood present, no active bleeding, no tissue present  Musculoskeletal: Normal range of motion.  Neurological: She is alert and oriented to person, place, and time. She has normal strength. No cranial nerve deficit or sensory deficit. Coordination normal. GCS eye subscore is 4. GCS verbal subscore is 5. GCS motor subscore is 6.  Skin: Skin is warm, dry and intact. No rash noted. No cyanosis.  Psychiatric: She has a normal mood and affect. Her speech is normal and behavior is normal. Thought content normal.  Nursing note and vitals reviewed.    ED Treatments / Results  Labs (all labs ordered are listed, but only abnormal results are displayed) Labs Reviewed  WET PREP, GENITAL - Abnormal; Notable for the following:       Result Value   WBC, Wet Prep HPF POC FEW (*)    All other components within normal limits  BASIC METABOLIC PANEL - Abnormal; Notable for the following:    Glucose, Bld 108 (*)    All other components within normal limits  URINALYSIS, ROUTINE W REFLEX MICROSCOPIC - Abnormal; Notable for the following:    Color, Urine STRAW (*)    Specific Gravity, Urine 1.004 (*)    Hgb urine dipstick SMALL (*)    Bacteria, UA RARE (*)    Squamous Epithelial / LPF  0-5 (*)    All other components within normal limits  HCG, QUANTITATIVE, PREGNANCY - Abnormal; Notable for the following:    hCG, Beta Chain, Quant, S 9,142 (*)    All other components within normal limits  I-STAT BETA HCG BLOOD, ED ([facility], [facility], [facility] ONLY) - Abnormal; Notable for the following:    I-stat hCG, quantitative >2,000.0 (*)    All other components within normal limits  CBC WITH DIFFERENTIAL/PLATELET  HIV ANTIBODY (ROUTINE TESTING)  RPR  ABO/RH  RH IG WORKUP (INCLUDES ABO/RH)  GC/CHLAMYDIA PROBE AMP ([place]) NOT AT [facility]    EKG  EKG Interpretation None  Radiology US Ob Comp Less 14 Wks  Result Date: 01/24/2017 CLINICAL DATA:  41 year old female with pelvic pain. Positive HCG levels. EXAM: OBSTETRIC <14 WK [place] AND TRANSVAGINAL OB US TECHNIQUE: Both transabdominal and transvaginal ultrasound examinations were performed for complete evaluation of the gestation as well as the maternal uterus, adnexal regions, and pelvic cul-de-sac. Transvaginal technique was performed to assess early pregnancy. COMPARISON:  None. FINDINGS: The uterus is anteverted and appears unremarkable. The endometrium measures approximately 10 mm in thickness. No intrauterine pregnancy identified. There is a sac-like structure in the right adnexa containing a yolk sac and fetal pole. Flickering activity within the fetal pole likely represents early heartbeat. The estimated gestational age based on crown-rump length of 4.6 mm is 6 weeks, 1 day. The ovaries are not visualized. No significant fluid noted within the pelvis. IMPRESSION: Right adnexal ectopic pregnancy with an estimated gestational age of [redacted] weeks, 1 day. A fetal pole with flickering activity noted likely representing heartbeat. Clinical correlation and obstetrical consult is advised. No free fluid noted within the pelvis. Nonvisualization of the ovaries. These results were called by telephone at the time of interpretation on 01/24/2017 at 4:20 am to  Dr. Joseph Berkshire , who verbally acknowledged these results. Electronically Signed   By: Anner Crete M.D.   On: 01/24/2017 04:20   US Ob Transvaginal  Result Date: 01/24/2017 CLINICAL DATA:  41 year old female with pelvic pain. Positive HCG levels. EXAM: OBSTETRIC <14 WK [place] AND TRANSVAGINAL OB US TECHNIQUE: Both transabdominal and transvaginal ultrasound examinations were performed for complete evaluation of the gestation as well as the maternal uterus, adnexal regions, and pelvic cul-de-sac. Transvaginal technique was performed to assess early pregnancy. COMPARISON:  None. FINDINGS: The uterus is anteverted and appears unremarkable. The endometrium measures approximately 10 mm in thickness. No intrauterine pregnancy identified. There is a sac-like structure in the right adnexa containing a yolk sac and fetal pole. Flickering activity within the fetal pole likely represents early heartbeat. The estimated gestational age based on crown-rump length of 4.6 mm is 6 weeks, 1 day. The ovaries are not visualized. No significant fluid noted within the pelvis. IMPRESSION: Right adnexal ectopic pregnancy with an estimated gestational age of [redacted] weeks, 1 day. A fetal pole with flickering activity noted likely representing heartbeat. Clinical correlation and obstetrical consult is advised. No free fluid noted within the pelvis. Nonvisualization of the ovaries. These results were called by telephone at the time of interpretation on 01/24/2017 at 4:20 am to Dr. Joseph Berkshire , who verbally acknowledged these results. Electronically Signed   By: Anner Crete M.D.   On: 01/24/2017 04:20   [place] Art/ven Flow Abd Pelv Doppler  Result Date: 01/24/2017 CLINICAL DATA:  41 year old female with pelvic pain. Positive HCG levels. EXAM: OBSTETRIC <14 WK [place] AND TRANSVAGINAL OB US TECHNIQUE: Both transabdominal and transvaginal ultrasound examinations were performed for complete evaluation of the gestation as well as the  maternal uterus, adnexal regions, and pelvic cul-de-sac. Transvaginal technique was performed to assess early pregnancy. COMPARISON:  None. FINDINGS: The uterus is anteverted and appears unremarkable. The endometrium measures approximately 10 mm in thickness. No intrauterine pregnancy identified. There is a sac-like structure in the right adnexa containing a yolk sac and fetal pole. Flickering activity within the fetal pole likely represents early heartbeat. The estimated gestational age based on crown-rump length of 4.6 mm is 6 weeks, 1 day. The ovaries are not visualized. No significant fluid noted within the pelvis. IMPRESSION: Right adnexal ectopic pregnancy with an  estimated gestational age of 110 weeks, 1 day. A fetal pole with flickering activity noted likely representing heartbeat. Clinical correlation and obstetrical consult is advised. No free fluid noted within the pelvis. Nonvisualization of the ovaries. These results were called by telephone at the time of interpretation on 01/24/2017 at 4:20 am to Dr. Joseph Berkshire , who verbally acknowledged these results. Electronically Signed   By: Anner Crete M.D.   On: 01/24/2017 04:20    Procedures Procedures (including critical care time)  Medications Ordered in ED Medications - No data to display   Initial Impression / Assessment and Plan / ED Course  I have reviewed the triage vital signs and the nursing notes.  Pertinent labs & imaging results that were available during my care of the patient were reviewed by me and considered in my medical decision making (see chart for details).     Patient presents with right sided pelvic pain last night followed by vaginal bleeding and now diffuse lower abdominal and pelvic cramping. She reports that she had a urine pregnancy test that was positive last week. Her serum beta-hCG was positive here, confirming pregnancy. Her vital signs are stable, she is not anemic. Ultrasound performed is  consistent with right adnexal ectopic pregnancy. Discussed with Dr. Elonda Husky, [place]. Patient will be brought to the operating room for laparoscopy.  Final Clinical Impressions(s) / ED Diagnoses   Final diagnoses:  Right ovarian pregnancy without intrauterine pregnancy  Trichomoniasis    New Prescriptions New Prescriptions   No medications on file     Orpah Greek, MD 01/24/17 [phone]

## 2017-01-24 NOTE — Discharge Instructions (Signed)
Ectopic Pregnancy An ectopic pregnancy is when the fertilized egg attaches (implants) outside the uterus. Most ectopic pregnancies occur in one of the tubes where eggs travel from the ovary to the uterus (fallopian tubes), but the implanting can occur in other locations. In rare cases, ectopic pregnancies occur on the ovary, intestine, pelvis, abdomen, or cervix. In an ectopic pregnancy, the fertilized egg does not have the ability to develop into a normal, healthy baby. A ruptured ectopic pregnancy is one in which tearing or bursting of a fallopian tube causes internal bleeding. Often, there is intense lower abdominal pain, and vaginal bleeding sometimes occurs. Having an ectopic pregnancy can be life-threatening. If this dangerous condition is not treated, it can lead to blood loss, shock, or even death. What are the causes? The most common cause of this condition is damage to one of the fallopian tubes. A fallopian tube may be narrowed or blocked, and that keeps the fertilized egg from reaching the uterus. What increases the risk? This condition is more likely to develop in women of childbearing age who have different levels of risk. The levels of risk can be divided into three categories. High risk  You have gone through infertility treatment.  You have had an ectopic pregnancy before.  You have had surgery on the fallopian tubes, or another surgical procedure, such as an abortion.  You have had surgery to have the fallopian tubes tied (tubal ligation).  You have problems or diseases of the fallopian tubes.  You have been exposed to diethylstilbestrol (DES). This medicine was used until 1971, and it had effects on babies whose mothers took the medicine.  You become pregnant while using an IUD (intrauterine device) for birth control. Moderate risk  You have a history of infertility.  You have had an STI (sexually transmitted infection).  You have a history of pelvic inflammatory  disease (PID).  You have scarring from endometriosis.  You have multiple sexual partners.  You smoke. Low risk  You have had pelvic surgery.  You use vaginal douches.  You became sexually active before age 18. What are the signs or symptoms? Common symptoms of this condition include normal pregnancy symptoms, such as missing a period, nausea, tiredness, abdominal pain, breast tenderness, and bleeding. However, ectopic pregnancy will have additional symptoms, such as:  Pain with intercourse.  Irregular vaginal bleeding or spotting.  Cramping or pain on one side or in the lower abdomen.  Fast heartbeat, low blood pressure, and sweating.  Passing out while having a bowel movement.  Symptoms of a ruptured ectopic pregnancy and internal bleeding may include:  Sudden, severe pain in the abdomen and pelvis.  Dizziness, weakness, light-headedness, or fainting.  Pain in the shoulder or neck area.  How is this diagnosed? This condition is diagnosed by:  A pelvic exam to locate pain or a mass in the abdomen.  A pregnancy test. This blood test checks for the presence as well as the specific level of pregnancy hormone in the bloodstream.  Ultrasound. This is performed if a pregnancy test is positive. In this test, a probe is inserted into the vagina. The probe will detect a fetus, possibly in a location other than the uterus.  Taking a sample of uterus tissue (dilation and curettage, or D&C).  Surgery to perform a visual exam of the inside of the abdomen using a thin, lighted tube that has a tiny camera on the end (laparoscope).  Culdocentesis. This procedure involves inserting a needle at the top   of the vagina, behind the uterus. If blood is present in this area, it may indicate that a fallopian tube is torn.  How is this treated? This condition is treated with medicine or surgery. Medicine  An injection of a medicine (methotrexate) may be given to cause the pregnancy tissue  to be absorbed. This medicine may save your fallopian tube. It may be given if: ? The diagnosis is made early, with no signs of active bleeding. ? The fallopian tube has not ruptured. ? You are considered to be a good candidate for the medicine. Usually, pregnancy hormone blood levels are checked after methotrexate treatment. This is to be sure that the medicine is effective. It may take 4-6 weeks for the pregnancy to be absorbed. Most pregnancies will be absorbed by 3 weeks. Surgery  A laparoscope may be used to remove the pregnancy tissue.  If severe internal bleeding occurs, a larger cut (incision) may be made in the lower abdomen (laparotomy) to remove the fetus and placenta. This is done to stop the bleeding.  Part or all of the fallopian tube may be removed (salpingectomy) along with the fetus and placenta. The fallopian tube may also be repaired during the surgery.  In very rare circumstances, removal of the uterus (hysterectomy) may be required.  After surgery, pregnancy hormone testing may be done to be sure that there is no pregnancy tissue left. Whether your treatment is medicine or surgery, you may receive a Rho (D) immune globulin shot to prevent problems with any future pregnancy. This shot may be given if:  You are Rh-negative and the baby's father is Rh-positive.  You are Rh-negative and you do not know the Rh type of the baby's father.  Follow these instructions at home:  Rest and limit your activity after the procedure for as long as told by your health care provider.  Until your health care provider says that it is safe: ? Do not lift anything that is heavier than 10 lb (4.5 kg), or the limit that your health care provider tells you. ? Avoid physical exercise and any movement that requires effort (is strenuous).  To help prevent constipation: ? Eat a healthy diet that includes fruits, vegetables, and whole grains. ? Drink 6-8 glasses of water per day. Get help  right away if:  You develop worsening pain that is not relieved by medicine.  You have: ? A fever or chills. ? Vaginal bleeding. ? Redness and swelling at the incision site. ? Nausea and vomiting.  You feel dizzy or weak.  You feel light-headed or you faint. This information is not intended to replace advice given to you by your health care provider. Make sure you discuss any questions you have with your health care provider. Document Released: 08/27/2004 Document Revised: 03/18/2016 Document Reviewed: 02/19/2016 Elsevier Interactive Patient Education  2018 [employer]  

## 2017-01-24 NOTE — Anesthesia Postprocedure Evaluation (Signed)
Anesthesia Post Note  Patient: Linda Ellis  Procedure(s) Performed: Procedure(s) (LRB): LAPAROSCOPIC UNILATERAL SALPINGECTOMY (Right) REMOVAL OF ECTOPIC PREGNANCY (Right)  Patient location during evaluation: PACU Anesthesia Type: General Level of consciousness: awake and alert, oriented and patient cooperative Pain management: pain level controlled Vital Signs Assessment: post-procedure vital signs reviewed and stable Respiratory status: spontaneous breathing and patient connected to nasal cannula oxygen Cardiovascular status: stable Postop Assessment: no signs of nausea or vomiting Anesthetic complications: no     Last Vitals:  Vitals:   01/24/17 0815 01/24/17 0830  BP: (!) 105/48 120/78  Pulse: 86 78  Resp: 15 17  Temp:      Last Pain:  Vitals:   01/24/17 0616  TempSrc: Oral  PainSc: 2                  ADAMS, AMY A

## 2017-01-24 NOTE — Anesthesia Procedure Notes (Signed)
Procedure Name: Intubation Date/Time: 01/24/2017 6:55 AM Performed by: Andree Elk, AMY A Pre-anesthesia Checklist: Patient identified, Patient being monitored, Timeout performed, Emergency Drugs available and Suction available Patient Re-evaluated:Patient Re-evaluated prior to inductionOxygen Delivery Method: Circle System Utilized Preoxygenation: Pre-oxygenation with 100% oxygen Intubation Type: IV induction, Cricoid Pressure applied and Rapid sequence Laryngoscope Size: Miller and 3 Grade View: Grade I Tube type: Oral Tube size: 7.0 mm Number of attempts: 1 Airway Equipment and Method: Stylet Placement Confirmation: ETT inserted through vocal cords under direct vision,  positive ETCO2 and breath sounds checked- equal and bilateral Secured at: 21 cm Tube secured with: Tape Dental Injury: Teeth and Oropharynx as per pre-operative assessment

## 2017-01-24 NOTE — ED Notes (Signed)
Pt returned to room from [place]. Nothing needed at this time.

## 2017-01-24 NOTE — H&P (Signed)
Preoperative History and Physical  Linda Ellis is a 41 y.o. [id number] with Patient's last menstrual period was 12/09/2016. admitted for a laparoscopic surgical management of a right ectopic pregnancy.  Sonogram reveals CRL 110w1d with +FCA, no free fluid Too large for methotrexate, proceed with laparoscopic right salpingectomy and removal of right ectopic pregnancy  PMH:    Past Medical History:  Diagnosis Date  . Asthma   . Multiple gastric ulcers     PSH:     Past Surgical History:  Procedure Laterality Date  . CHOLECYSTECTOMY      POb/GynH:      OB History    Gravida Para Term Preterm AB Living   5 4 4     4    SAB TAB Ectopic Multiple Live Births                  SH:   Social History  Substance Use Topics  . Smoking status: Current Every Day Smoker    Packs/day: 0.50    Years: 22.00    Types: Cigarettes  . Smokeless tobacco: Never Used  . Alcohol use Yes     Comment: occasionally    FH:    Family History  Problem Relation Age of Onset  . Hypertension Mother   . Hypertension Father      Allergies: No Known Allergies  Medications:      No current facility-administered medications for this encounter.   Current Outpatient Prescriptions:  .  azithromycin (ZITHROMAX Z-PAK) 250 MG tablet, Take one tablet per day for the next 4 days., Disp: 4 tablet, Rfl: 0 .  beclomethasone (QVAR) 80 MCG/ACT inhaler, Inhale 2 puffs into the lungs 2 (two) times daily., Disp: , Rfl:  .  guaiFENesin-codeine (ROBITUSSIN AC) 100-10 MG/5ML syrup, Take 10 mLs by mouth 3 (three) times daily as needed for cough., Disp: 120 mL, Rfl: 0 .  HYDROcodone-acetaminophen (NORCO/VICODIN) 5-325 MG per tablet, Take 1 tablet by mouth every 4 (four) hours as needed for pain., Disp: 12 tablet, Rfl: 0 .  predniSONE (DELTASONE) 20 MG tablet, Take 3 pills per day for 2 days, then 2 pills per day for 2 days, then 1 pill per day for 2 days., Disp: 12 tablet, Rfl: 0 .  ranitidine (ZANTAC) 150 MG tablet, Take  150 mg by mouth 2 (two) times daily., Disp: , Rfl:  .  acetaminophen (TYLENOL) 500 MG tablet, Take 1,000 mg by mouth every 6 (six) hours as needed. Pain., Disp: , Rfl:  .  albuterol (PROVENTIL HFA;VENTOLIN HFA) 108 (90 BASE) MCG/ACT inhaler, Inhale 1-2 puffs into the lungs every 6 (six) hours as needed for wheezing., Disp: 1 Inhaler, Rfl: 0 .  dextromethorphan 15 MG/5ML syrup, Take 10 mLs by mouth 4 (four) times daily as needed. Cough/congestion., Disp: , Rfl:   Review of Systems:   Review of Systems  Constitutional: Negative for fever, chills, weight loss, malaise/fatigue and diaphoresis.  HENT: Negative for hearing loss, ear pain, nosebleeds, congestion, sore throat, neck pain, tinnitus and ear discharge.   Eyes: Negative for blurred vision, double vision, photophobia, pain, discharge and redness.  Respiratory: Negative for cough, hemoptysis, sputum production, shortness of breath, wheezing and stridor.   Cardiovascular: Negative for chest pain, palpitations, orthopnea, claudication, leg swelling and PND.  Gastrointestinal: Positive for abdominal pain. Negative for heartburn, nausea, vomiting, diarrhea, constipation, blood in stool and melena.  Genitourinary: Negative for dysuria, urgency, frequency, hematuria and flank pain.  Musculoskeletal: Negative for myalgias, back pain, joint pain  and falls.  Skin: Negative for itching and rash.  Neurological: Negative for dizziness, tingling, tremors, sensory change, speech change, focal weakness, seizures, loss of consciousness, weakness and headaches.  Endo/Heme/Allergies: Negative for environmental allergies and polydipsia. Does not bruise/bleed easily.  Psychiatric/Behavioral: Negative for depression, suicidal ideas, hallucinations, memory loss and substance abuse. The patient is not nervous/anxious and does not have insomnia.      PHYSICAL EXAM:  Blood pressure (!) 128/92, pulse 96, temperature 98.1 F (36.7 C), temperature source Oral, resp.  rate 18, weight 280 lb (127 kg), last menstrual period 12/09/2016, SpO2 100 %.    Vitals reviewed. Constitutional: She is oriented to person, place, and time. She appears well-developed and well-nourished.  HENT:  Head: Normocephalic and atraumatic.  Right Ear: External ear normal.  Left Ear: External ear normal.  Nose: Nose normal.  Mouth/Throat: Oropharynx is clear and moist.  Eyes: Conjunctivae and EOM are normal. Pupils are equal, round, and reactive to light. Right eye exhibits no discharge. Left eye exhibits no discharge. No scleral icterus.  Neck: Normal range of motion. Neck supple. No tracheal deviation present. No thyromegaly present.  Cardiovascular: Normal rate, regular rhythm, normal heart sounds and intact distal pulses.  Exam reveals no gallop and no friction rub.   No murmur heard. Respiratory: Effort normal and breath sounds normal. No respiratory distress. She has no wheezes. She has no rales. She exhibits no tenderness.  GI: Soft. Bowel sounds are normal. She exhibits no distension and no mass. There is tenderness. There is no rebound and no guarding.  Genitourinary:       Vulva is normal without lesions Vagina is pink moist without discharge Cervix normal in appearance and pap is normal Uterus is normal size, contour, position, consistency, mobility, non-tender Adnexa is negative with normal sized ovaries by sonogram  Musculoskeletal: Normal range of motion. She exhibits no edema and no tenderness.  Neurological: She is alert and oriented to person, place, and time. She has normal reflexes. She displays normal reflexes. No cranial nerve deficit. She exhibits normal muscle tone. Coordination normal.  Skin: Skin is warm and dry. No rash noted. No erythema. No pallor.  Psychiatric: She has a normal mood and affect. Her behavior is normal. Judgment and thought content normal.    Labs: Results for orders placed or performed during the hospital encounter of 01/24/17 (from  the past 336 hour(s))  Urinalysis, Routine w reflex microscopic   Collection Time: 01/24/17  1:14 AM  Result Value Ref Range   Color, Urine STRAW (A) YELLOW   APPearance CLEAR CLEAR   Specific Gravity, Urine 1.004 (L) 1.005 - 1.030   pH 6.0 5.0 - 8.0   Glucose, UA NEGATIVE NEGATIVE mg/dL   Hgb urine dipstick SMALL (A) NEGATIVE   Bilirubin Urine NEGATIVE NEGATIVE   Ketones, ur NEGATIVE NEGATIVE mg/dL   Protein, ur NEGATIVE NEGATIVE mg/dL   Nitrite NEGATIVE NEGATIVE   Leukocytes, UA NEGATIVE NEGATIVE   RBC / HPF 0-5 0 - 5 RBC/hpf   WBC, UA 0-5 0 - 5 WBC/hpf   Bacteria, UA RARE (A) NONE SEEN   Squamous Epithelial / LPF 0-5 (A) NONE SEEN  CBC with Differential/Platelet   Collection Time: 01/24/17  1:24 AM  Result Value Ref Range   WBC 9.5 4.0 - 10.5 K/uL   RBC 4.34 3.87 - 5.11 MIL/uL   Hemoglobin 13.5 12.0 - 15.0 g/dL   HCT 39.3 36.0 - 46.0 %   MCV 90.6 78.0 - 100.0 fL  MCH 31.1 26.0 - 34.0 pg   MCHC 34.4 30.0 - 36.0 g/dL   RDW 12.7 11.5 - 15.5 %   Platelets 338 150 - 400 K/uL   Neutrophils Relative % 56 %   Neutro Abs 5.4 1.7 - 7.7 K/uL   Lymphocytes Relative 34 %   Lymphs Abs 3.2 0.7 - 4.0 K/uL   Monocytes Relative 6 %   Monocytes Absolute 0.6 0.1 - 1.0 K/uL   Eosinophils Relative 3 %   Eosinophils Absolute 0.2 0.0 - 0.7 K/uL   Basophils Relative 1 %   Basophils Absolute 0.1 0.0 - 0.1 K/uL  Basic metabolic panel   Collection Time: 01/24/17  1:24 AM  Result Value Ref Range   Sodium 136 135 - 145 mmol/L   Potassium 3.8 3.5 - 5.1 mmol/L   Chloride 104 101 - 111 mmol/L   CO2 24 22 - 32 mmol/L   Glucose, Bld 108 (H) 65 - 99 mg/dL   BUN 8 6 - 20 mg/dL   Creatinine, Ser 0.80 0.44 - 1.00 mg/dL   Calcium 9.3 8.9 - 10.3 mg/dL   GFR calc non Af Amer >60 >60 mL/min   GFR calc Af Amer >60 >60 mL/min   Anion gap 8 5 - 15  hCG, quantitative, pregnancy   Collection Time: 01/24/17  1:24 AM  Result Value Ref Range   hCG, Beta Chain, Quant, S 9,142 (H) <5 mIU/mL  ABO/Rh    Collection Time: 01/24/17  1:24 AM  Result Value Ref Range   ABO/RH(D) O NEG   I-Stat beta hCG blood, ED ([facility], [facility], [facility] only)   Collection Time: 01/24/17  2:16 AM  Result Value Ref Range   I-stat hCG, quantitative >2,000.0 (H) <5 mIU/mL   Comment 3          Wet prep, genital   Collection Time: 01/24/17  2:35 AM  Result Value Ref Range   Yeast Wet Prep HPF POC NONE SEEN NONE SEEN   Trich, Wet Prep PRESENT NONE SEEN   Clue Cells Wet Prep HPF POC NONE SEEN NONE SEEN   WBC, Wet Prep HPF POC FEW (A) NONE SEEN   Sperm NEGATIVE     EKG: No orders found for this or any previous visit.  Imaging Studies: US Ob Comp Less 14 Wks  Result Date: 01/24/2017 CLINICAL DATA:  41 year old female with pelvic pain. Positive HCG levels. EXAM: OBSTETRIC <14 WK [place] AND TRANSVAGINAL OB US TECHNIQUE: Both transabdominal and transvaginal ultrasound examinations were performed for complete evaluation of the gestation as well as the maternal uterus, adnexal regions, and pelvic cul-de-sac. Transvaginal technique was performed to assess early pregnancy. COMPARISON:  None. FINDINGS: The uterus is anteverted and appears unremarkable. The endometrium measures approximately 10 mm in thickness. No intrauterine pregnancy identified. There is a sac-like structure in the right adnexa containing a yolk sac and fetal pole. Flickering activity within the fetal pole likely represents early heartbeat. The estimated gestational age based on crown-rump length of 4.6 mm is 6 weeks, 1 day. The ovaries are not visualized. No significant fluid noted within the pelvis. IMPRESSION: Right adnexal ectopic pregnancy with an estimated gestational age of [redacted] weeks, 1 day. A fetal pole with flickering activity noted likely representing heartbeat. Clinical correlation and obstetrical consult is advised. No free fluid noted within the pelvis. Nonvisualization of the ovaries. These results were called by telephone at the time of interpretation on 01/24/2017 at  4:20 am to Dr. Joseph Berkshire , who verbally acknowledged  these results. Electronically Signed   By: Anner Crete M.D.   On: 01/24/2017 04:20   US Ob Transvaginal  Result Date: 01/24/2017 CLINICAL DATA:  41 year old female with pelvic pain. Positive HCG levels. EXAM: OBSTETRIC <14 WK [place] AND TRANSVAGINAL OB US TECHNIQUE: Both transabdominal and transvaginal ultrasound examinations were performed for complete evaluation of the gestation as well as the maternal uterus, adnexal regions, and pelvic cul-de-sac. Transvaginal technique was performed to assess early pregnancy. COMPARISON:  None. FINDINGS: The uterus is anteverted and appears unremarkable. The endometrium measures approximately 10 mm in thickness. No intrauterine pregnancy identified. There is a sac-like structure in the right adnexa containing a yolk sac and fetal pole. Flickering activity within the fetal pole likely represents early heartbeat. The estimated gestational age based on crown-rump length of 4.6 mm is 6 weeks, 1 day. The ovaries are not visualized. No significant fluid noted within the pelvis. IMPRESSION: Right adnexal ectopic pregnancy with an estimated gestational age of [redacted] weeks, 1 day. A fetal pole with flickering activity noted likely representing heartbeat. Clinical correlation and obstetrical consult is advised. No free fluid noted within the pelvis. Nonvisualization of the ovaries. These results were called by telephone at the time of interpretation on 01/24/2017 at 4:20 am to Dr. Joseph Berkshire , who verbally acknowledged these results. Electronically Signed   By: Anner Crete M.D.   On: 01/24/2017 04:20   [place] Art/ven Flow Abd Pelv Doppler  Result Date: 01/24/2017 CLINICAL DATA:  41 year old female with pelvic pain. Positive HCG levels. EXAM: OBSTETRIC <14 WK [place] AND TRANSVAGINAL OB US TECHNIQUE: Both transabdominal and transvaginal ultrasound examinations were performed for complete evaluation of the gestation as  well as the maternal uterus, adnexal regions, and pelvic cul-de-sac. Transvaginal technique was performed to assess early pregnancy. COMPARISON:  None. FINDINGS: The uterus is anteverted and appears unremarkable. The endometrium measures approximately 10 mm in thickness. No intrauterine pregnancy identified. There is a sac-like structure in the right adnexa containing a yolk sac and fetal pole. Flickering activity within the fetal pole likely represents early heartbeat. The estimated gestational age based on crown-rump length of 4.6 mm is 6 weeks, 1 day. The ovaries are not visualized. No significant fluid noted within the pelvis. IMPRESSION: Right adnexal ectopic pregnancy with an estimated gestational age of [redacted] weeks, 1 day. A fetal pole with flickering activity noted likely representing heartbeat. Clinical correlation and obstetrical consult is advised. No free fluid noted within the pelvis. Nonvisualization of the ovaries. These results were called by telephone at the time of interpretation on 01/24/2017 at 4:20 am to Dr. Joseph Berkshire , who verbally acknowledged these results. Electronically Signed   By: Anner Crete M.D.   On: 01/24/2017 04:20      Assessment: Right ectopic pregnancy [redacted]w[redacted]d CRL with +FCA HCG>9000  Plan: Laparoscopic right salpingectomy with removal of ectopic pregnancy  EURE,LUTHER H 01/24/2017 5:52 AM

## 2017-01-25 ENCOUNTER — Telehealth: Payer: Self-pay | Admitting: *Deleted

## 2017-01-25 ENCOUNTER — Encounter (HOSPITAL_COMMUNITY): Payer: Self-pay | Admitting: Obstetrics & Gynecology

## 2017-01-25 LAB — RPR: RPR: NONREACTIVE

## 2017-01-25 LAB — GC/CHLAMYDIA PROBE AMP (~~LOC~~) NOT AT ARMC
Chlamydia: NEGATIVE
Neisseria Gonorrhea: NEGATIVE

## 2017-01-25 LAB — RH IG WORKUP (INCLUDES ABO/RH)
ABO/RH(D): O NEG
Gestational Age(Wks): 6
UNIT TAG COMMENT: 6
Unit division: 0

## 2017-01-25 LAB — HIV ANTIBODY (ROUTINE TESTING W REFLEX): HIV SCREEN 4TH GENERATION: NONREACTIVE

## 2017-01-25 NOTE — Telephone Encounter (Signed)
Patient called stating she noticed dark brown blood "clumps" when she went to the bathroom this am after waking up. Bleeding is very light and only when she wipes, no cramping. Informed patient that the dark brown sounded like old blood and was normal. She may have noticed "clumps" d/t blood pooling in her uterus. Advised to let us know if bleeding increases or severe pain or cramping occurs and to keep appointment for next week.  Verbalized understanding.

## 2017-01-27 ENCOUNTER — Telehealth: Payer: Self-pay | Admitting: Obstetrics & Gynecology

## 2017-01-27 NOTE — Telephone Encounter (Signed)
Pt called requesting additional instructions on incision care after her surgery. She stated at discharge the only instructions she received were to remove the bandages after 72 hours. Pt wanted to know if she could shower. I advised pt that she could shower but to make sure that she patted the incision dry after. I informed her that she did not have to put a dressing back over the incision. Reviewed s/s of infection at incision with pt such as redness, warmth, drainage & fever. Pt verbalized understanding.

## 2017-01-28 ENCOUNTER — Encounter (HOSPITAL_COMMUNITY): Payer: Self-pay | Admitting: Obstetrics & Gynecology

## 2017-02-01 ENCOUNTER — Encounter: Payer: Self-pay | Admitting: Obstetrics & Gynecology

## 2017-02-01 ENCOUNTER — Ambulatory Visit (INDEPENDENT_AMBULATORY_CARE_PROVIDER_SITE_OTHER): Payer: Self-pay | Admitting: Obstetrics & Gynecology

## 2017-02-01 VITALS — BP 126/86 | HR 108

## 2017-02-01 DIAGNOSIS — Z9889 Other specified postprocedural states: Secondary | ICD-10-CM

## 2017-02-01 NOTE — Progress Notes (Signed)
  HPI: Patient returns for routine postoperative follow-up having undergone laparoscopic right salpingectomy for removal of a large viable 6 week plus right ectopic pregnancy on 01/24/2017.  The patient's immediate postoperative recovery has been unremarkable. Since hospital discharge the patient reports no problems postop.   Current Outpatient Prescriptions: acetaminophen (TYLENOL) 500 MG tablet, Take 1,000 mg by mouth every 6 (six) hours as needed. Pain., Disp: , Rfl:  albuterol (PROVENTIL HFA;VENTOLIN HFA) 108 (90 BASE) MCG/ACT inhaler, Inhale 1-2 puffs into the lungs every 6 (six) hours as needed for wheezing., Disp: 1 Inhaler, Rfl: 0 beclomethasone (QVAR) 80 MCG/ACT inhaler, Inhale 2 puffs into the lungs 2 (two) times daily., Disp: , Rfl:  oxyCODONE-acetaminophen (ROXICET) 5-325 MG tablet, Take 1-2 tablets by mouth every 6 (six) hours as needed for severe pain., Disp: 30 tablet, Rfl: 0 ranitidine (ZANTAC) 150 MG tablet, Take 150 mg by mouth 2 (two) times daily., Disp: , Rfl:  ketorolac (TORADOL) 10 MG tablet, Take 1 tablet (10 mg total) by mouth every 8 (eight) hours as needed. (Patient not taking: Reported on 02/01/2017), Disp: 15 tablet, Rfl: 0 ondansetron (ZOFRAN ODT) 8 MG disintegrating tablet, Take 1 tablet (8 mg total) by mouth every 8 (eight) hours as needed for nausea or vomiting. (Patient not taking: Reported on 02/01/2017), Disp: 20 tablet, Rfl: 0  No current facility-administered medications for this visit.     Blood pressure 126/86, pulse (!) 108, last menstrual period 12/09/2016.  Physical Exam: Incisions 3 clean dry and intact staples removed some bruising on the abdominal wall  Diagnostic Tests:   Pathology: Right ectopic pregnancy  Impression: Status post laparoscopic right salpingectomy for surgical management of a 6 week plus right ectopic pregnancy  Plan: Routine postop care  Follow up: As needed    Florian Buff, MD

## 2017-04-04 ENCOUNTER — Encounter (HOSPITAL_COMMUNITY): Payer: Self-pay | Admitting: Emergency Medicine

## 2017-04-04 ENCOUNTER — Other Ambulatory Visit: Payer: Self-pay

## 2017-04-04 ENCOUNTER — Emergency Department (HOSPITAL_COMMUNITY)
Admission: EM | Admit: 2017-04-04 | Discharge: 2017-04-05 | Disposition: A | Discharge status: Home / Self Care | Payer: Self-pay | Attending: Emergency Medicine | Admitting: Emergency Medicine

## 2017-04-04 ENCOUNTER — Emergency Department (HOSPITAL_COMMUNITY): Payer: Self-pay

## 2017-04-04 DIAGNOSIS — F17210 Nicotine dependence, cigarettes, uncomplicated: Secondary | ICD-10-CM | POA: Insufficient documentation

## 2017-04-04 DIAGNOSIS — J069 Acute upper respiratory infection, unspecified: Secondary | ICD-10-CM

## 2017-04-04 DIAGNOSIS — J4521 Mild intermittent asthma with (acute) exacerbation: Secondary | ICD-10-CM

## 2017-04-04 DIAGNOSIS — Z79899 Other long term (current) drug therapy: Secondary | ICD-10-CM | POA: Insufficient documentation

## 2017-04-04 MED ORDER — ALBUTEROL SULFATE (2.5 MG/3ML) 0.083% IN NEBU
5.0000 mg | INHALATION_SOLUTION | Freq: Once | RESPIRATORY_TRACT | Status: AC
  Administered 2017-04-04: 5 mg via RESPIRATORY_TRACT
  Filled 2017-04-04: qty 6

## 2017-04-04 NOTE — ED Triage Notes (Signed)
Pt states that she thinks she has a cold, increased SOB for 2 days, has run out of inhaler

## 2017-04-05 MED ORDER — ALBUTEROL SULFATE HFA 108 (90 BASE) MCG/ACT IN AERS: 2.0000 | INHALATION_SPRAY | RESPIRATORY_TRACT | 0 refills | Status: DC | PRN

## 2017-04-05 MED ORDER — ALBUTEROL SULFATE HFA 108 (90 BASE) MCG/ACT IN AERS
2.0000 | INHALATION_SPRAY | RESPIRATORY_TRACT | Status: DC | PRN
  Administered 2017-04-05: 2 via RESPIRATORY_TRACT
  Filled 2017-04-05: qty 6.7

## 2017-04-05 NOTE — ED Provider Notes (Addendum)
[facility] DEPT Provider Note   CSN: [id number] Arrival date & time: 04/04/17  2231  Time seen 23:17 PM    History   Chief Complaint Chief Complaint  Patient presents with  . Shortness of Breath    HPI Linda Ellis is a 41 y.o. female.  HPI  Patient has a history of reactive airway disease. She states she ran out of her inhaler last week. She states yesterday morning she started having cough with brown yellow sputum production, rhinorrhea with brown-yellow drainage, sore throat, chest tightness and wheezing. She states sometimes when she coughs, she has right-sided chest pain. But she describes it as mild. She denies nausea, vomiting, diarrhea. She states the last time she was on steroids were is a long time ago. She states she feels like if she had had her inhaler she would've been able to control her symptoms.  PCP none  Past Medical History:  Diagnosis Date  . Asthma   . Multiple gastric ulcers     There are no active problems to display for this patient.   Past Surgical History:  Procedure Laterality Date  . CHOLECYSTECTOMY    . DIAGNOSTIC LAPAROSCOPY WITH REMOVAL OF ECTOPIC PREGNANCY Right 01/24/2017   Procedure: REMOVAL OF ECTOPIC PREGNANCY;  Surgeon: Florian Buff, MD;  Location: [facility];  Service: Gynecology;  Laterality: Right;  . LAPAROSCOPIC UNILATERAL SALPINGECTOMY Right 01/24/2017   Procedure: LAPAROSCOPIC UNILATERAL SALPINGECTOMY;  Surgeon: Florian Buff, MD;  Location: [facility];  Service: Gynecology;  Laterality: Right;    OB History    Gravida Para Term Preterm AB Living   5 4 4     4    SAB TAB Ectopic Multiple Live Births                   Home Medications    Prior to Admission medications   Medication Sig Start Date End Date Taking? Authorizing Provider  acetaminophen (TYLENOL) 500 MG tablet Take 1,000 mg by mouth every 6 (six) hours as needed. Pain.   Yes [provider]  beclomethasone (QVAR) 80 MCG/ACT inhaler Inhale 2 puffs into  the lungs 2 (two) times daily.   Yes [provider]  Pseudoeph-Doxylamine-DM-APAP 30-6.25-15-325 MG CAPS Take 2 capsules by mouth once as needed (for cold aymptoms).   Yes [provider]  ranitidine (ZANTAC) 150 MG tablet Take 150-300 mg by mouth daily as needed for heartburn.    Yes [provider]  albuterol (PROVENTIL HFA;VENTOLIN HFA) 108 (90 Base) MCG/ACT inhaler Inhale 2 puffs into the lungs every 4 (four) hours as needed. 04/05/17   Rolland Porter, MD  ketorolac (TORADOL) 10 MG tablet Take 1 tablet (10 mg total) by mouth every 8 (eight) hours as needed. Patient not taking: Reported on 02/01/2017 01/24/17   Florian Buff, MD  ondansetron (ZOFRAN ODT) 8 MG disintegrating tablet Take 1 tablet (8 mg total) by mouth every 8 (eight) hours as needed for nausea or vomiting. Patient not taking: Reported on 02/01/2017 01/24/17   Florian Buff, MD  oxyCODONE-acetaminophen (ROXICET) 5-325 MG tablet Take 1-2 tablets by mouth every 6 (six) hours as needed for severe pain. Patient not taking: Reported on 04/04/2017 01/24/17   Florian Buff, MD    Family History Family History  Problem Relation Age of Onset  . Diabetes Maternal Grandfather   . Heart attack Maternal Grandfather     Social History Social History  Substance Use Topics  . Smoking status: Current Every Day  Smoker    Packs/day: 0.50    Years: 22.00    Types: Cigarettes  . Smokeless tobacco: Never Used  . Alcohol use Yes     Comment: occasionally  quit smoking over 1 ppd Smoking 1-2 black and milds a day States she is a student   Allergies   Patient has no known allergies.   Review of Systems Review of Systems  All other systems reviewed and are negative.    Physical Exam Updated Vital Signs BP 112/72   Pulse (!) 103   Temp 98.6 F (37 C) (Oral)   Resp 20   Ht 5\' 5"  (1.651 m)   Wt 129.7 kg (286 lb)   LMP 03/22/2017   SpO2 95%   BMI 47.59 kg/m   Vital signs normal except fever   Physical  Exam  Constitutional: She is oriented to person, place, and time. She appears well-developed and well-nourished.  Non-toxic appearance. She does not appear ill. No distress.  HENT:  Head: Normocephalic and atraumatic.  Right Ear: External ear normal.  Left Ear: External ear normal.  Nose: Nose normal. No mucosal edema or rhinorrhea.  Mouth/Throat: Oropharynx is clear and moist and mucous membranes are normal. No dental abscesses or uvula swelling.  Eyes: Pupils are equal, round, and reactive to light. Conjunctivae and EOM are normal.  Neck: Normal range of motion and full passive range of motion without pain. Neck supple.  Cardiovascular: Normal rate, regular rhythm and normal heart sounds.  Exam reveals no gallop and no friction rub.   No murmur heard. Pulmonary/Chest: Effort normal. No accessory muscle usage. No tachypnea. No respiratory distress. She has decreased breath sounds. She has wheezes. She has no rhonchi. She has no rales. She exhibits no tenderness and no crepitus.  Abdominal: Soft. Normal appearance and bowel sounds are normal. She exhibits no distension. There is no tenderness. There is no rebound and no guarding.  Musculoskeletal: Normal range of motion. She exhibits no edema or tenderness.  Moves all extremities well.   Neurological: She is alert and oriented to person, place, and time. She has normal strength. No cranial nerve deficit.  Skin: Skin is warm, dry and intact. No rash noted. No erythema. No pallor.  Psychiatric: She has a normal mood and affect. Her speech is normal and behavior is normal. Her mood appears not anxious.  Nursing note and vitals reviewed.    ED Treatments / Results  Labs (all labs ordered are listed, but only abnormal results are displayed) Labs Reviewed - No data to display  EKG  EKG Interpretation None     ED ECG REPORT   Date: 04/05/2017  Rate: 92  Rhythm: normal sinus rhythm and premature ventricular contractions (PVC)  QRS Axis:  right  Intervals: normal  ST/T Wave abnormalities: normal  Conduction Disutrbances:none  Narrative Interpretation: low voltage  Old EKG Reviewed: none available  I have personally reviewed the EKG tracing and agree with the computerized printout as noted.   Radiology Dg Chest 2 View  Result Date: 04/04/2017 CLINICAL DATA:  Chest pain and shortness of breath. EXAM: CHEST  2 VIEW COMPARISON:  Radiographs 06/02/2012 FINDINGS: The cardiomediastinal contours are normal. Mild central bronchial thickening, chronic. Pulmonary vasculature is normal. No consolidation, pleural effusion, or pneumothorax. No acute osseous abnormalities are seen. IMPRESSION: Mild chronic central bronchial thickening.  No acute abnormality. Electronically Signed   By: Jeb Levering M.D.   On: 04/04/2017 23:25    Procedures Procedures (including critical care time)  Medications Ordered in ED Medications  albuterol (PROVENTIL HFA;VENTOLIN HFA) 108 (90 Base) MCG/ACT inhaler 2 puff (not administered)  albuterol (PROVENTIL) (2.5 MG/3ML) 0.083% nebulizer solution 5 mg (5 mg Nebulization Given 04/04/17 2326)     Initial Impression / Assessment and Plan / ED Course  I have reviewed the triage vital signs and the nursing notes.  Pertinent labs & imaging results that were available during my care of the patient were reviewed by me and considered in my medical decision making (see chart for details).    Patient was given albuterol nebulizer. I did not start her on steroids since she states normally she doesn't have to be placed on them.  Recheck 12:40 AM patient is feeling better. On reexam she has improved air movement and has rare rhonchi and some wheezing. However she has refused another nebulizer. She states she feels like she will be fine.She has asked multiple times to get the free inhaler. She also asked me to look at her left ear she feels like she can't hear. When I look at her ear or ear canal was clean. She has  some clear fluid behind the tympanic membrane. This is consistent with her URI symptoms.  Final Clinical Impressions(s) / ED Diagnoses   Final diagnoses:  Mild intermittent asthma with exacerbation  Upper respiratory tract infection, unspecified type    New Prescriptions New Prescriptions   ALBUTEROL (PROVENTIL HFA;VENTOLIN HFA) 108 (90 BASE) MCG/ACT INHALER    Inhale 2 puffs into the lungs every 4 (four) hours as needed.    Plan discharge  Rolland Porter, MD, Barbette Or, MD 04/05/17 Dierdre Highman    Rolland Porter, MD 04/05/17 [phone]

## 2017-04-05 NOTE — Discharge Instructions (Signed)
Use the inhaler for wheezing. You can take mucinex DM OTC for your cough. You can take zyrtec OTC for sneezing and running nose. Drink plenty of fluids. Recheck if you get a fever or if you aren't improving over the next week.

## 2017-05-21 ENCOUNTER — Encounter (HOSPITAL_COMMUNITY): Payer: Self-pay | Admitting: Emergency Medicine

## 2017-05-21 ENCOUNTER — Emergency Department (HOSPITAL_COMMUNITY)
Admission: EM | Admit: 2017-05-21 | Discharge: 2017-05-21 | Disposition: A | Discharge status: Home / Self Care | Payer: Self-pay | Attending: Emergency Medicine | Admitting: Emergency Medicine

## 2017-05-21 ENCOUNTER — Emergency Department (HOSPITAL_COMMUNITY): Payer: Self-pay

## 2017-05-21 DIAGNOSIS — Z79899 Other long term (current) drug therapy: Secondary | ICD-10-CM | POA: Insufficient documentation

## 2017-05-21 DIAGNOSIS — J4541 Moderate persistent asthma with (acute) exacerbation: Secondary | ICD-10-CM | POA: Insufficient documentation

## 2017-05-21 DIAGNOSIS — Z87891 Personal history of nicotine dependence: Secondary | ICD-10-CM | POA: Insufficient documentation

## 2017-05-21 LAB — CBC WITH DIFFERENTIAL/PLATELET
BASOS ABS: 0.1 10*3/uL (ref 0.0–0.1)
BASOS PCT: 1 %
EOS ABS: 0.3 10*3/uL (ref 0.0–0.7)
Eosinophils Relative: 4 %
HEMATOCRIT: 42.7 % (ref 36.0–46.0)
HEMOGLOBIN: 14.2 g/dL (ref 12.0–15.0)
LYMPHS ABS: 3.2 10*3/uL (ref 0.7–4.0)
LYMPHS PCT: 34 %
MCH: 30.9 pg (ref 26.0–34.0)
MCHC: 33.3 g/dL (ref 30.0–36.0)
MCV: 93 fL (ref 78.0–100.0)
MONO ABS: 0.6 10*3/uL (ref 0.1–1.0)
Monocytes Relative: 6 %
NEUTROS ABS: 5.2 10*3/uL (ref 1.7–7.7)
Neutrophils Relative %: 55 %
Platelets: 344 10*3/uL (ref 150–400)
RBC: 4.59 MIL/uL (ref 3.87–5.11)
RDW: 13.2 % (ref 11.5–15.5)
WBC: 9.3 10*3/uL (ref 4.0–10.5)

## 2017-05-21 LAB — BASIC METABOLIC PANEL
ANION GAP: 7 (ref 5–15)
BUN: 7 mg/dL (ref 6–20)
CHLORIDE: 103 mmol/L (ref 101–111)
CO2: 25 mmol/L (ref 22–32)
Calcium: 8.8 mg/dL — ABNORMAL LOW (ref 8.9–10.3)
Creatinine, Ser: 0.66 mg/dL (ref 0.44–1.00)
GFR calc non Af Amer: >60 mL/min (ref >60)
Glucose, Bld: 99 mg/dL (ref 65–99)
POTASSIUM: 4.2 mmol/L (ref 3.5–5.1)
SODIUM: 135 mmol/L (ref 135–145)

## 2017-05-21 MED ORDER — ALBUTEROL SULFATE HFA 108 (90 BASE) MCG/ACT IN AERS: 2.0000 | INHALATION_SPRAY | RESPIRATORY_TRACT | 0 refills | Status: DC | PRN

## 2017-05-21 MED ORDER — PREDNISONE 20 MG PO TABS: ORAL_TABLET | ORAL | 0 refills | Status: DC

## 2017-05-21 MED ORDER — PREDNISONE 50 MG PO TABS
60.0000 mg | ORAL_TABLET | Freq: Once | ORAL | Status: AC
  Administered 2017-05-21: 60 mg via ORAL
  Filled 2017-05-21: qty 1

## 2017-05-21 MED ORDER — ALBUTEROL (5 MG/ML) CONTINUOUS INHALATION SOLN
15.0000 mg/h | INHALATION_SOLUTION | Freq: Once | RESPIRATORY_TRACT | Status: AC
  Administered 2017-05-21: 15 mg/h via RESPIRATORY_TRACT
  Filled 2017-05-21: qty 20

## 2017-05-21 NOTE — ED Triage Notes (Signed)
Patient complaining of shortness of breath since last night. States she has history of asthma and inhalers not working. Patient's breathing labored and respirations 28 bpm at triage.

## 2017-05-21 NOTE — Discharge Instructions (Signed)
Use your albuterol inhaler with spacer 2 puffs every 4 hours as needed for shortness of breath. Return if needed more than every 4 hours. Call the [facility] to get a primary care physician to take care of your asthma and all other health problems

## 2017-05-21 NOTE — ED Provider Notes (Signed)
[facility] EMERGENCY DEPARTMENT Provider Note   CSN: [id number] Arrival date & time: 05/21/17  8657     History   Chief Complaint Chief Complaint  Patient presents with  . Shortness of Breath    HPI Linda Ellis is a 41 y.o. female.  HPI Complains of wheezing typical of asthma onset yesterday accompanied by nonproductive cough. No fever. Treated with albuterol, without relief. Nothing makes symptoms better or worse. No other associated symptoms. She reports that she is gotten relief in the past with prednisone.. No other associated symptoms Past Medical History:  Diagnosis Date  . Asthma   . Multiple gastric ulcers    No history of intubation. One ED visit in the past year or asthma There are no active problems to display for this patient.   Past Surgical History:  Procedure Laterality Date  . CHOLECYSTECTOMY    . DIAGNOSTIC LAPAROSCOPY WITH REMOVAL OF ECTOPIC PREGNANCY Right 01/24/2017   Procedure: REMOVAL OF ECTOPIC PREGNANCY;  Surgeon: Florian Buff, MD;  Location: [facility];  Service: Gynecology;  Laterality: Right;  . LAPAROSCOPIC UNILATERAL SALPINGECTOMY Right 01/24/2017   Procedure: LAPAROSCOPIC UNILATERAL SALPINGECTOMY;  Surgeon: Florian Buff, MD;  Location: [facility];  Service: Gynecology;  Laterality: Right;    OB History    Gravida Para Term Preterm AB Living   5 4 4     4    SAB TAB Ectopic Multiple Live Births                   Home Medications    Prior to Admission medications   Medication Sig Start Date End Date Taking? Authorizing Provider  acetaminophen (TYLENOL) 500 MG tablet Take 1,000 mg by mouth every 6 (six) hours as needed. Pain.    [provider]  albuterol (PROVENTIL HFA;VENTOLIN HFA) 108 (90 Base) MCG/ACT inhaler Inhale 2 puffs into the lungs every 4 (four) hours as needed. 04/05/17   Rolland Porter, MD  beclomethasone (QVAR) 80 MCG/ACT inhaler Inhale 2 puffs into the lungs 2 (two) times daily.    [provider]  ketorolac  (TORADOL) 10 MG tablet Take 1 tablet (10 mg total) by mouth every 8 (eight) hours as needed. Patient not taking: Reported on 02/01/2017 01/24/17   Florian Buff, MD  ondansetron (ZOFRAN ODT) 8 MG disintegrating tablet Take 1 tablet (8 mg total) by mouth every 8 (eight) hours as needed for nausea or vomiting. Patient not taking: Reported on 02/01/2017 01/24/17   Florian Buff, MD  oxyCODONE-acetaminophen (ROXICET) 5-325 MG tablet Take 1-2 tablets by mouth every 6 (six) hours as needed for severe pain. Patient not taking: Reported on 04/04/2017 01/24/17   Florian Buff, MD  Pseudoeph-Doxylamine-DM-APAP 30-6.25-15-325 MG CAPS Take 2 capsules by mouth once as needed (for cold aymptoms).    [provider]  ranitidine (ZANTAC) 150 MG tablet Take 150-300 mg by mouth daily as needed for heartburn.     [provider]    Family History Family History  Problem Relation Age of Onset  . Diabetes Maternal Grandfather   . Heart attack Maternal Grandfather     Social History Social History  Substance Use Topics  . Smoking status: Former Smoker    Packs/day: 0.50    Years: 22.00    Types: Cigarettes    Quit date: 03/21/2017  . Smokeless tobacco: Never Used  . Alcohol use Yes     Comment: occasionally     Allergies   Patient has no  known allergies.   Review of Systems Review of Systems  Constitutional: Negative.   HENT: Negative.   Respiratory: Positive for cough, shortness of breath and wheezing.   Cardiovascular: Negative.   Gastrointestinal: Negative.   Musculoskeletal: Negative.   Skin: Negative.   Neurological: Negative.   Psychiatric/Behavioral: Negative.   All other systems reviewed and are negative.    Physical Exam Updated Vital Signs BP 117/82   Pulse 94   Temp 98.6 F (37 C) (Oral)   Resp 20   Ht 5\' 5"  (1.651 m)   Wt 117.9 kg (260 lb)   LMP 03/19/2017   SpO2 100%   Breastfeeding? Unknown   BMI 43.27 kg/m   Physical Exam  Constitutional: She  appears well-developed and well-nourished. No distress.  HENT:  Head: Normocephalic and atraumatic.  Eyes: Pupils are equal, round, and reactive to light. Conjunctivae are normal.  Neck: Neck supple. No tracheal deviation present. No thyromegaly present.  Cardiovascular: Normal rate and regular rhythm.   No murmur heard. Pulmonary/Chest: Effort normal. She has wheezes.  Prolonged expiratory phase with expiratory wheezes. No respiratory distress  Abdominal: Soft. Bowel sounds are normal. She exhibits no distension. There is no tenderness.  Morbidly obese  Musculoskeletal: Normal range of motion. She exhibits no edema or tenderness.  Neurological: She is alert. Coordination normal.  Skin: Skin is warm and dry. No rash noted.  Psychiatric: She has a normal mood and affect.  Nursing note and vitals reviewed.    ED Treatments / Results  Labs (all labs ordered are listed, but only abnormal results are displayed) Labs Reviewed  CBC WITH DIFFERENTIAL/PLATELET  BASIC METABOLIC PANEL    EKG  EKG Interpretation None     Chest x-ray viewed by me  Radiology Dg Chest 2 View  Result Date: 05/21/2017 CLINICAL DATA:  Shortness of breath. EXAM: CHEST  2 VIEW COMPARISON:  April 04, 2017 FINDINGS: The heart size and mediastinal contours are within normal limits. Both lungs are clear. The visualized skeletal structures are unremarkable. IMPRESSION: No active cardiopulmonary disease. Electronically Signed   By: Dorise Bullion III M.D   On: 05/21/2017 11:24    Procedures Procedures (including critical care time)  Medications Ordered in ED Medications  albuterol (PROVENTIL,VENTOLIN) solution continuous neb (15 mg/hr Nebulization Given 05/21/17 1223)  predniSONE (DELTASONE) tablet 60 mg (60 mg Oral Given 05/21/17 1220)   Chest x-ray viewed by me Results for orders placed or performed during the hospital encounter of 05/21/17  CBC with Differential/Platelet  Result Value Ref Range    WBC 9.3 4.0 - 10.5 K/uL   RBC 4.59 3.87 - 5.11 MIL/uL   Hemoglobin 14.2 12.0 - 15.0 g/dL   HCT 42.7 36.0 - 46.0 %   MCV 93.0 78.0 - 100.0 fL   MCH 30.9 26.0 - 34.0 pg   MCHC 33.3 30.0 - 36.0 g/dL   RDW 13.2 11.5 - 15.5 %   Platelets 344 150 - 400 K/uL   Neutrophils Relative % 55 %   Neutro Abs 5.2 1.7 - 7.7 K/uL   Lymphocytes Relative 34 %   Lymphs Abs 3.2 0.7 - 4.0 K/uL   Monocytes Relative 6 %   Monocytes Absolute 0.6 0.1 - 1.0 K/uL   Eosinophils Relative 4 %   Eosinophils Absolute 0.3 0.0 - 0.7 K/uL   Basophils Relative 1 %   Basophils Absolute 0.1 0.0 - 0.1 K/uL  Basic metabolic panel  Result Value Ref Range   Sodium 135 135 - 145 mmol/L  Potassium 4.2 3.5 - 5.1 mmol/L   Chloride 103 101 - 111 mmol/L   CO2 25 22 - 32 mmol/L   Glucose, Bld 99 65 - 99 mg/dL   BUN 7 6 - 20 mg/dL   Creatinine, Ser 0.66 0.44 - 1.00 mg/dL   Calcium 8.8 (L) 8.9 - 10.3 mg/dL   GFR calc non Af Amer >60 >60 mL/min   GFR calc Af Amer >60 >60 mL/min   Anion gap 7 5 - 15   Dg Chest 2 View  Result Date: 05/21/2017 CLINICAL DATA:  Shortness of breath. EXAM: CHEST  2 VIEW COMPARISON:  April 04, 2017 FINDINGS: The heart size and mediastinal contours are within normal limits. Both lungs are clear. The visualized skeletal structures are unremarkable. IMPRESSION: No active cardiopulmonary disease. Electronically Signed   By: Dorise Bullion III M.D   On: 05/21/2017 11:24   Initial Impression / Assessment and Plan / ED Course  I have reviewed the triage vital signs and the nursing notes.  Pertinent labs & imaging results that were available during my care of the patient were reviewed by me and considered in my medical decision making (see chart for details).     1:45 PM patient's breathing is atbaseline after treatment with one hour of continuous nebulization with albuterol and oral prednisone. She states that she wheezes at at baseline.. On repeat exam lungs with an extra wheezes. She speaks in  paragraphs. She is able to walk around the emergency department without dyspnea Plan prescription prednisone, albuterol HFA. She has a spacer at home. She is instructed to use her albuterol every 4 hours as needed for shortness of breath or wheeze. Return if needed more than every 4 hours. Referral to [facility] for primary care Final Clinical Impressions(s) / ED Diagnoses  Diagnosis exacerbation of asthma Final diagnoses:  None    New Prescriptions New Prescriptions   No medications on file     Orlie Dakin, MD 05/21/17 1352

## 2017-05-23 ENCOUNTER — Encounter (HOSPITAL_COMMUNITY): Payer: Self-pay | Admitting: Emergency Medicine

## 2017-05-23 ENCOUNTER — Emergency Department (HOSPITAL_COMMUNITY)
Admission: EM | Admit: 2017-05-23 | Discharge: 2017-05-23 | Disposition: A | Discharge status: Home / Self Care | Payer: Self-pay | Attending: Emergency Medicine | Admitting: Emergency Medicine

## 2017-05-23 DIAGNOSIS — Z79899 Other long term (current) drug therapy: Secondary | ICD-10-CM | POA: Insufficient documentation

## 2017-05-23 DIAGNOSIS — J4551 Severe persistent asthma with (acute) exacerbation: Secondary | ICD-10-CM | POA: Insufficient documentation

## 2017-05-23 DIAGNOSIS — Z87891 Personal history of nicotine dependence: Secondary | ICD-10-CM | POA: Insufficient documentation

## 2017-05-23 MED ORDER — IPRATROPIUM-ALBUTEROL 0.5-2.5 (3) MG/3ML IN SOLN
3.0000 mL | Freq: Once | RESPIRATORY_TRACT | Status: AC
  Administered 2017-05-23: 3 mL via RESPIRATORY_TRACT
  Filled 2017-05-23: qty 3

## 2017-05-23 MED ORDER — ALBUTEROL SULFATE (2.5 MG/3ML) 0.083% IN NEBU
5.0000 mg | INHALATION_SOLUTION | Freq: Once | RESPIRATORY_TRACT | Status: AC
  Administered 2017-05-23: 5 mg via RESPIRATORY_TRACT
  Filled 2017-05-23: qty 6

## 2017-05-23 MED ORDER — ONDANSETRON 4 MG PO TBDP
4.0000 mg | ORAL_TABLET | Freq: Once | ORAL | Status: AC
  Administered 2017-05-23: 4 mg via ORAL
  Filled 2017-05-23: qty 1

## 2017-05-23 MED ORDER — ALBUTEROL (5 MG/ML) CONTINUOUS INHALATION SOLN
10.0000 mg/h | INHALATION_SOLUTION | Freq: Once | RESPIRATORY_TRACT | Status: AC
Start: 2017-05-23 — End: 2017-05-23
  Administered 2017-05-23: 10 mg/h via RESPIRATORY_TRACT
  Filled 2017-05-23: qty 20

## 2017-05-23 MED ORDER — IPRATROPIUM BROMIDE 0.02 % IN SOLN
0.5000 mg | Freq: Once | RESPIRATORY_TRACT | Status: AC
  Administered 2017-05-23: 0.5 mg via RESPIRATORY_TRACT
  Filled 2017-05-23: qty 2.5

## 2017-05-23 MED ORDER — PREDNISONE 50 MG PO TABS
60.0000 mg | ORAL_TABLET | Freq: Once | ORAL | Status: AC
  Administered 2017-05-23: 60 mg via ORAL
  Filled 2017-05-23: qty 1

## 2017-05-23 MED ORDER — ALBUTEROL SULFATE HFA 108 (90 BASE) MCG/ACT IN AERS
2.0000 | INHALATION_SPRAY | Freq: Once | RESPIRATORY_TRACT | Status: AC
  Administered 2017-05-23: 2 via RESPIRATORY_TRACT
  Filled 2017-05-23: qty 6.7

## 2017-05-23 NOTE — Discharge Instructions (Signed)
As discussed,  please use the provided inhaler every 4 hours for the next 2 days, and be sure to obtain the recently prescribed steroids.  Return here for concerning changes in her condition.

## 2017-05-23 NOTE — ED Provider Notes (Signed)
[facility] EMERGENCY DEPARTMENT Provider Note   CSN: [id number] Arrival date & time: 05/23/17  1200     History   Chief Complaint Chief Complaint  Patient presents with  . Asthma    HPI Linda Ellis is a 41 y.o. female.  HPI  Presents with difficulty breathing. Patient has a notable history of asthma, was seen here 2 days ago for similar dyspnea. She notes that after treatment here with continuous albuterol session, steroids, she was feeling better. She did not obtain her prescriptions on discharge, notes that over the past 12 or 18 hours she has had no recurrence of her dyspnea, with audible wheezing. No new chest pain, abdominal pain, fever, syncope.   Past Medical History:  Diagnosis Date  . Asthma   . Multiple gastric ulcers     There are no active problems to display for this patient.   Past Surgical History:  Procedure Laterality Date  . CHOLECYSTECTOMY    . DIAGNOSTIC LAPAROSCOPY WITH REMOVAL OF ECTOPIC PREGNANCY Right 01/24/2017   Procedure: REMOVAL OF ECTOPIC PREGNANCY;  Surgeon: Florian Buff, MD;  Location: [facility];  Service: Gynecology;  Laterality: Right;  . LAPAROSCOPIC UNILATERAL SALPINGECTOMY Right 01/24/2017   Procedure: LAPAROSCOPIC UNILATERAL SALPINGECTOMY;  Surgeon: Florian Buff, MD;  Location: [facility];  Service: Gynecology;  Laterality: Right;    OB History    Gravida Para Term Preterm AB Living   5 4 4     4    SAB TAB Ectopic Multiple Live Births                   Home Medications    Prior to Admission medications   Medication Sig Start Date End Date Taking? Authorizing Provider  acetaminophen (TYLENOL) 500 MG tablet Take 1,000 mg by mouth every 6 (six) hours as needed. Pain.    [provider]  albuterol (PROVENTIL HFA;VENTOLIN HFA) 108 (90 Base) MCG/ACT inhaler Inhale 2 puffs into the lungs every 2 (two) hours as needed for wheezing or shortness of breath (cough). 05/21/17   Orlie Dakin, MD  beclomethasone (QVAR) 80  MCG/ACT inhaler Inhale 2 puffs into the lungs 2 (two) times daily.    [provider]  ketorolac (TORADOL) 10 MG tablet Take 1 tablet (10 mg total) by mouth every 8 (eight) hours as needed. Patient not taking: Reported on 02/01/2017 01/24/17   Florian Buff, MD  ondansetron (ZOFRAN ODT) 8 MG disintegrating tablet Take 1 tablet (8 mg total) by mouth every 8 (eight) hours as needed for nausea or vomiting. Patient not taking: Reported on 02/01/2017 01/24/17   Florian Buff, MD  oxyCODONE-acetaminophen (ROXICET) 5-325 MG tablet Take 1-2 tablets by mouth every 6 (six) hours as needed for severe pain. Patient not taking: Reported on 04/04/2017 01/24/17   Florian Buff, MD  predniSONE (DELTASONE) 20 MG tablet 2 tabs po daily x 4 days . Take first dose 05/22/2017 05/21/17   Orlie Dakin, MD  ranitidine (ZANTAC) 150 MG tablet Take 150-300 mg by mouth daily as needed for heartburn.     [provider]    Family History Family History  Problem Relation Age of Onset  . Diabetes Maternal Grandfather   . Heart attack Maternal Grandfather     Social History Social History  Substance Use Topics  . Smoking status: Former Smoker    Packs/day: 0.50    Years: 22.00    Types: Cigarettes    Quit date: 03/21/2017  . Smokeless  tobacco: Never Used  . Alcohol use Yes     Comment: occasionally     Allergies   Patient has no known allergies.   Review of Systems Review of Systems  Constitutional:       Per HPI, otherwise negative  HENT:       Per HPI, otherwise negative  Respiratory:       Per HPI, otherwise negative  Cardiovascular:       Per HPI, otherwise negative  Gastrointestinal: Negative for vomiting.  Endocrine:       Negative aside from HPI  Genitourinary:       Neg aside from HPI   Musculoskeletal:       Per HPI, otherwise negative  Skin: Negative.   Neurological: Negative for syncope.     Physical Exam Updated Vital Signs BP (!) 117/58 (BP Location: Right Arm)    Pulse 90   Temp 98.6 F (37 C) (Oral)   Resp 16   Ht 5\' 5"  (1.651 m)   Wt 117.9 kg (260 lb)   LMP 05/14/2017   SpO2 97%   BMI 43.27 kg/m   Physical Exam  Constitutional: She is oriented to person, place, and time. She appears well-developed and well-nourished. No distress.  HENT:  Head: Normocephalic and atraumatic.  Eyes: Conjunctivae and EOM are normal.  Cardiovascular: Normal rate and regular rhythm.   Pulmonary/Chest: No stridor. Tachypnea noted. She has decreased breath sounds. She has wheezes.  Abdominal: She exhibits no distension.  Musculoskeletal: She exhibits no edema.  Neurological: She is alert and oriented to person, place, and time. No cranial nerve deficit.  Skin: Skin is warm and dry.  Psychiatric: She has a normal mood and affect.  Nursing note and vitals reviewed.    ED Treatments / Results   Procedures Procedures (including critical care time)  Medications Ordered in ED Medications  albuterol (PROVENTIL,VENTOLIN) solution continuous neb (not administered)  ipratropium (ATROVENT) nebulizer solution 0.5 mg (not administered)  predniSONE (DELTASONE) tablet 60 mg (not administered)  albuterol (PROVENTIL) (2.5 MG/3ML) 0.083% nebulizer solution 5 mg (5 mg Nebulization Given 05/23/17 1227)     Initial Impression / Assessment and Plan / ED Course  I have reviewed the triage vital signs and the nursing notes.  Pertinent labs & imaging results that were available during my care of the patient were reviewed by me and considered in my medical decision making (see chart for details).  Chart review notable for recent evaluation for similar concerns, with substantial improvement.  With concern for asthma exacerbation/recurrence, the patient received steroids, breathing treatments.  Update: Following multiple breathing treatments, continuous nebs, steroids the patient is substantially improved. I discussed with her the importance of medication compliance  including obtaining prescribed steroids.  The patient was prescribed steroids and provided albuterol inhaler, discharged in stable condition. Absent fever, and with substantial improvement, low suspicion for pneumonia. Final Clinical Impressions(s) / ED Diagnoses  Asthma exacerbation   Carmin Muskrat, MD 05/23/17 [phone]

## 2017-05-23 NOTE — ED Triage Notes (Signed)
Patient reports "asthma attack" that started approx at 6am. Patient reports using inhaler with no relief, last used inhaler 1 hour prior to arriving to ER. Patient states same thing happened 2 days ago in which she was seen here in ER and given breathing treatment, inhaler and prednisone. Patient states "I felt much better after the treatment. I think I just did to much yesterday."  Patient reports prescription for prednisone but has not had it filled yet. Does not have nebulizer at home.

## 2017-05-23 NOTE — Progress Notes (Signed)
Patient wanted to postpone Duoneb tx until Zofran given . She said she was feeling very sick. Inspiratory and expiratory wheeze noted SpO2 87-90% RA. Pt placed on 2L [place].

## 2017-05-31 ENCOUNTER — Emergency Department (HOSPITAL_COMMUNITY): Payer: Self-pay

## 2017-05-31 ENCOUNTER — Emergency Department (HOSPITAL_COMMUNITY)
Admission: EM | Admit: 2017-05-31 | Discharge: 2017-05-31 | Disposition: A | Discharge status: Home / Self Care | Payer: Self-pay | Attending: Emergency Medicine | Admitting: Emergency Medicine

## 2017-05-31 ENCOUNTER — Encounter (HOSPITAL_COMMUNITY): Payer: Self-pay | Admitting: *Deleted

## 2017-05-31 DIAGNOSIS — Z87891 Personal history of nicotine dependence: Secondary | ICD-10-CM | POA: Insufficient documentation

## 2017-05-31 DIAGNOSIS — J4541 Moderate persistent asthma with (acute) exacerbation: Secondary | ICD-10-CM | POA: Insufficient documentation

## 2017-05-31 DIAGNOSIS — Z79899 Other long term (current) drug therapy: Secondary | ICD-10-CM | POA: Insufficient documentation

## 2017-05-31 MED ORDER — DEXAMETHASONE SODIUM PHOSPHATE 4 MG/ML IJ SOLN
10.0000 mg | Freq: Once | INTRAMUSCULAR | Status: AC
  Administered 2017-05-31: 10 mg via INTRAVENOUS
  Filled 2017-05-31: qty 3

## 2017-05-31 MED ORDER — ALBUTEROL SULFATE HFA 108 (90 BASE) MCG/ACT IN AERS
2.0000 | INHALATION_SPRAY | Freq: Once | RESPIRATORY_TRACT | Status: AC
  Administered 2017-05-31: 2 via RESPIRATORY_TRACT
  Filled 2017-05-31: qty 6.7

## 2017-05-31 MED ORDER — PREDNISONE 10 MG PO TABS: ORAL_TABLET | ORAL | 0 refills | Status: DC

## 2017-05-31 MED ORDER — ALBUTEROL SULFATE HFA 108 (90 BASE) MCG/ACT IN AERS: 2.0000 | INHALATION_SPRAY | RESPIRATORY_TRACT | 0 refills | Status: DC | PRN

## 2017-05-31 MED ORDER — ALBUTEROL SULFATE (2.5 MG/3ML) 0.083% IN NEBU
5.0000 mg | INHALATION_SOLUTION | Freq: Once | RESPIRATORY_TRACT | Status: AC
  Administered 2017-05-31: 5 mg via RESPIRATORY_TRACT
  Filled 2017-05-31: qty 6

## 2017-05-31 NOTE — ED Notes (Signed)
Pt's O2 sats on room air prior to ambulation were 94% and dropped to 91% during ambulation. Dr. Dina Rich notified.

## 2017-05-31 NOTE — ED Triage Notes (Signed)
Pt c/o sob that started last night; pt states she has lost her inhaler; pt was seen here last week for same complaint

## 2017-05-31 NOTE — Discharge Instructions (Signed)
It is very important that you take your longer dose of steroids and keep your inhaler close by.  If you develop any new or worsening symptoms you should be reevaluated.

## 2017-05-31 NOTE — ED Provider Notes (Signed)
[facility] EMERGENCY DEPARTMENT Provider Note   CSN: [id number] Arrival date & time: 05/31/17  0543     History   Chief Complaint Chief Complaint  Patient presents with  . Shortness of Breath    HPI Linda Ellis is a 41 y.o. female.  HPI  This is a 41 year old female who presents with shortness of breath.  Patient reports that she misplaced her inhaler.  She was here 1 week ago and was given steroids and inhaler for asthma exacerbation.  She was actually seen twice that she did not originally get her prescription filled.  She reports that she finished her steroids 3 days ago.  She was doing well but woke up this morning feeling short of breath.  She misplaced her inhaler.  She reports at baseline that she has daily wheezing symptoms but they are well controlled if she has an inhaler.  She denies any recent fevers.  Does report cough.  No lower extremity swelling.  Past Medical History:  Diagnosis Date  . Asthma   . Multiple gastric ulcers     There are no active problems to display for this patient.   Past Surgical History:  Procedure Laterality Date  . CHOLECYSTECTOMY    . DIAGNOSTIC LAPAROSCOPY WITH REMOVAL OF ECTOPIC PREGNANCY Right 01/24/2017   Procedure: REMOVAL OF ECTOPIC PREGNANCY;  Surgeon: Florian Buff, MD;  Location: [facility];  Service: Gynecology;  Laterality: Right;  . LAPAROSCOPIC UNILATERAL SALPINGECTOMY Right 01/24/2017   Procedure: LAPAROSCOPIC UNILATERAL SALPINGECTOMY;  Surgeon: Florian Buff, MD;  Location: [facility];  Service: Gynecology;  Laterality: Right;    OB History    Gravida Para Term Preterm AB Living   5 4 4     4    SAB TAB Ectopic Multiple Live Births                   Home Medications    Prior to Admission medications   Medication Sig Start Date End Date Taking? Authorizing Provider  acetaminophen (TYLENOL) 500 MG tablet Take 1,000 mg by mouth every 6 (six) hours as needed. Pain.    [provider]  albuterol (PROVENTIL  HFA;VENTOLIN HFA) 108 (90 Base) MCG/ACT inhaler Inhale 2 puffs into the lungs every 4 (four) hours as needed for wheezing or shortness of breath. 05/31/17   Rhoderick Farrel, Barbette Hair, MD  beclomethasone (QVAR) 80 MCG/ACT inhaler Inhale 2 puffs into the lungs 2 (two) times daily.    [provider]  predniSONE (DELTASONE) 10 MG tablet Take 5 tablets by mouth times 3 days, take 4 tablets by mouth times 3 days, take 3 tablets by mouth times 3 days, take 2 tablets by mouth times 3 days, take 1 tablet by mouth times 3 days, take 1/2 tablet by mouth times 3 days 05/31/17   Jaime Dome, Barbette Hair, MD  ranitidine (ZANTAC) 150 MG tablet Take 150-300 mg by mouth daily as needed for heartburn.     [provider]    Family History Family History  Problem Relation Age of Onset  . Diabetes Maternal Grandfather   . Heart attack Maternal Grandfather     Social History Social History  Substance Use Topics  . Smoking status: Former Smoker    Packs/day: 0.50    Years: 22.00    Types: Cigarettes    Quit date: 03/21/2017  . Smokeless tobacco: Never Used  . Alcohol use Yes     Comment: occasionally     Allergies   Patient  has no known allergies.   Review of Systems Review of Systems  Constitutional: Negative for fever.  Respiratory: Positive for shortness of breath and wheezing.   Cardiovascular: Negative for chest pain and leg swelling.  Gastrointestinal: Negative for abdominal pain, nausea and vomiting.  All other systems reviewed and are negative.    Physical Exam Updated Vital Signs BP 134/88 (BP Location: Right Arm)   Pulse 91   Temp 98.4 F (36.9 C) (Oral)   Resp 18   Ht 5\' 5"  (1.651 m)   Wt 117.9 kg (260 lb)   LMP 05/14/2017   SpO2 95%   BMI 43.27 kg/m   Physical Exam  Constitutional: She is oriented to person, place, and time.  Morbidly obese, increased work of breathing  HENT:  Head: Normocephalic and atraumatic.  Cardiovascular: Normal rate, regular rhythm and  normal heart sounds.   Pulmonary/Chest: No respiratory distress. She has no wheezes.  Mild tachypnea, fair air movement, inspiratory and expiratory wheezing in all lung fields  Abdominal: Soft. There is no tenderness.  Neurological: She is alert and oriented to person, place, and time.  Skin: Skin is warm and dry.  Psychiatric: She has a normal mood and affect.  Nursing note and vitals reviewed.    ED Treatments / Results  Labs (all labs ordered are listed, but only abnormal results are displayed) Labs Reviewed - No data to display  EKG  EKG Interpretation None       Radiology No results found.  Procedures Procedures (including critical care time)  Medications Ordered in ED Medications  albuterol (PROVENTIL) (2.5 MG/3ML) 0.083% nebulizer solution 5 mg (5 mg Nebulization Given 05/31/17 0603)  albuterol (PROVENTIL HFA;VENTOLIN HFA) 108 (90 Base) MCG/ACT inhaler 2 puff (2 puffs Inhalation Given 05/31/17 0605)  dexamethasone (DECADRON) injection 10 mg (10 mg Intravenous Given 05/31/17 4401)     Initial Impression / Assessment and Plan / ED Course  I have reviewed the triage vital signs and the nursing notes.  Pertinent labs & imaging results that were available during my care of the patient were reviewed by me and considered in my medical decision making (see chart for details).  Clinical Course as of May 31 653  Mon May 31, 2017  0272 Patient states that she feels better.  She continues to have some wheezing on exam but is in no acute distress.  She was given an additional 4 puffs of an inhaler.  Will ambulate with pulse ox.  [CH]    Clinical Course User Index [CH] Katilin Raynes, Barbette Hair, MD    Patient presents with wheezing.  States that she lost her inhaler.  She is otherwise nontoxic appearing.  She does have mild increased work of breathing but fair air movement.  Patient was given a DuoNeb and IM Decadron.  Given recent visits and short course of steroids, patient may  benefit from a longer, tapered dose of steroids.  She likely has some moderate persistent asthma.  After 1 DuoNeb, patient states she feels better and recheck is as above.  She was given 4 additional puffs of her inhaler.  She ambulated and maintained her pulse ox between 91 and 93% with no acute distress.  Will discharge home with a steroid taper and an extra inhaler.  No indication for new imaging at this time.  Recent imaging reviewed.  After history, exam, and medical workup I feel the patient has been appropriately medically screened and is safe for discharge home. Pertinent diagnoses were discussed with  the patient. Patient was given return precautions.   Final Clinical Impressions(s) / ED Diagnoses   Final diagnoses:  Moderate persistent asthma with exacerbation    New Prescriptions New Prescriptions   ALBUTEROL (PROVENTIL HFA;VENTOLIN HFA) 108 (90 BASE) MCG/ACT INHALER    Inhale 2 puffs into the lungs every 4 (four) hours as needed for wheezing or shortness of breath.   PREDNISONE (DELTASONE) 10 MG TABLET    Take 5 tablets by mouth times 3 days, take 4 tablets by mouth times 3 days, take 3 tablets by mouth times 3 days, take 2 tablets by mouth times 3 days, take 1 tablet by mouth times 3 days, take 1/2 tablet by mouth times 3 days     Merryl Hacker, MD 05/31/17 [phone]

## 2017-06-24 ENCOUNTER — Encounter (HOSPITAL_COMMUNITY): Payer: Self-pay | Admitting: Emergency Medicine

## 2017-06-24 ENCOUNTER — Emergency Department (HOSPITAL_COMMUNITY): Payer: Self-pay

## 2017-06-24 ENCOUNTER — Inpatient Hospital Stay (HOSPITAL_COMMUNITY)
Admission: EM | Admit: 2017-06-24 | Discharge: 2017-06-24 | DRG: 202 | Disposition: A | Discharge status: Home / Self Care | Payer: Self-pay | Attending: Internal Medicine | Admitting: Internal Medicine

## 2017-06-24 DIAGNOSIS — Z87891 Personal history of nicotine dependence: Secondary | ICD-10-CM

## 2017-06-24 DIAGNOSIS — J45901 Unspecified asthma with (acute) exacerbation: Secondary | ICD-10-CM

## 2017-06-24 DIAGNOSIS — J4551 Severe persistent asthma with (acute) exacerbation: Principal | ICD-10-CM

## 2017-06-24 DIAGNOSIS — J181 Lobar pneumonia, unspecified organism: Secondary | ICD-10-CM

## 2017-06-24 DIAGNOSIS — J189 Pneumonia, unspecified organism: Secondary | ICD-10-CM

## 2017-06-24 LAB — CBC
HCT: 42.9 % (ref 36.0–46.0)
Hemoglobin: 13.9 g/dL (ref 12.0–15.0)
MCH: 30.8 pg (ref 26.0–34.0)
MCHC: 32.4 g/dL (ref 30.0–36.0)
MCV: 95.1 fL (ref 78.0–100.0)
PLATELETS: 340 10*3/uL (ref 150–400)
RBC: 4.51 MIL/uL (ref 3.87–5.11)
RDW: 13.5 % (ref 11.5–15.5)
WBC: 11.1 10*3/uL — ABNORMAL HIGH (ref 4.0–10.5)

## 2017-06-24 LAB — BASIC METABOLIC PANEL
Anion gap: 5 (ref 5–15)
BUN: 10 mg/dL (ref 6–20)
CALCIUM: 8.9 mg/dL (ref 8.9–10.3)
CHLORIDE: 106 mmol/L (ref 101–111)
CO2: 25 mmol/L (ref 22–32)
CREATININE: 0.75 mg/dL (ref 0.44–1.00)
Glucose, Bld: 112 mg/dL — ABNORMAL HIGH (ref 65–99)
Potassium: 4.1 mmol/L (ref 3.5–5.1)
SODIUM: 136 mmol/L (ref 135–145)

## 2017-06-24 MED ORDER — METHYLPREDNISOLONE SODIUM SUCC 125 MG IJ SOLR
125.0000 mg | Freq: Once | INTRAMUSCULAR | Status: AC
  Administered 2017-06-24: 125 mg via INTRAVENOUS
  Filled 2017-06-24: qty 2

## 2017-06-24 MED ORDER — DEXTROSE 5 % IV SOLN
1.0000 g | Freq: Once | INTRAVENOUS | Status: AC
  Administered 2017-06-24: 1 g via INTRAVENOUS
  Filled 2017-06-24: qty 10

## 2017-06-24 MED ORDER — AZITHROMYCIN 250 MG PO TABS: 500.0000 mg | ORAL_TABLET | Freq: Once | ORAL | Status: DC

## 2017-06-24 MED ORDER — DEXTROSE 5 % IV SOLN: 1.0000 g | INTRAVENOUS | Status: DC

## 2017-06-24 MED ORDER — IPRATROPIUM BROMIDE 0.02 % IN SOLN
0.5000 mg | Freq: Once | RESPIRATORY_TRACT | Status: AC
  Administered 2017-06-24: 0.5 mg via RESPIRATORY_TRACT
  Filled 2017-06-24: qty 2.5

## 2017-06-24 MED ORDER — AZITHROMYCIN 250 MG PO TABS: 250.0000 mg | ORAL_TABLET | Freq: Every day | ORAL | 0 refills | Status: DC

## 2017-06-24 MED ORDER — ENOXAPARIN SODIUM 40 MG/0.4ML ~~LOC~~ SOLN
40.0000 mg | SUBCUTANEOUS | Status: DC
Start: 2017-06-24 — End: 2017-06-24

## 2017-06-24 MED ORDER — ALBUTEROL SULFATE HFA 108 (90 BASE) MCG/ACT IN AERS: 2.0000 | INHALATION_SPRAY | RESPIRATORY_TRACT | Status: DC | PRN

## 2017-06-24 MED ORDER — PREDNISONE 10 MG PO TABS: 20.0000 mg | ORAL_TABLET | Freq: Two times a day (BID) | ORAL | 0 refills | Status: DC

## 2017-06-24 MED ORDER — METHYLPREDNISOLONE SODIUM SUCC 125 MG IJ SOLR: 60.0000 mg | Freq: Two times a day (BID) | INTRAMUSCULAR | Status: DC

## 2017-06-24 MED ORDER — AZITHROMYCIN 250 MG PO TABS
500.0000 mg | ORAL_TABLET | Freq: Once | ORAL | Status: AC
  Administered 2017-06-24: 500 mg via ORAL
  Filled 2017-06-24: qty 2

## 2017-06-24 MED ORDER — ALBUTEROL (5 MG/ML) CONTINUOUS INHALATION SOLN
10.0000 mg/h | INHALATION_SOLUTION | Freq: Once | RESPIRATORY_TRACT | Status: AC
  Administered 2017-06-24: 10 mg/h via RESPIRATORY_TRACT
  Filled 2017-06-24: qty 20

## 2017-06-24 MED ORDER — IPRATROPIUM-ALBUTEROL 0.5-2.5 (3) MG/3ML IN SOLN
3.0000 mL | Freq: Once | RESPIRATORY_TRACT | Status: AC
  Administered 2017-06-24: 3 mL via RESPIRATORY_TRACT
  Filled 2017-06-24: qty 3

## 2017-06-24 MED ORDER — IPRATROPIUM-ALBUTEROL 0.5-2.5 (3) MG/3ML IN SOLN: 3.0000 mL | RESPIRATORY_TRACT | Status: DC | PRN

## 2017-06-24 NOTE — ED Triage Notes (Signed)
Pt states she has been wheezing for the past few days with sob.  Used last of inhaler this morning.

## 2017-06-24 NOTE — ED Notes (Signed)
Pt states she feels better and wants to go home.  States she always has some wheezing.  Hospitalist in to see pt.

## 2017-06-24 NOTE — ED Notes (Signed)
sats 93-94 % while ambulating.  Pt no visible distress.

## 2017-06-24 NOTE — ED Provider Notes (Addendum)
[facility] EMERGENCY DEPARTMENT Provider Note   CSN: [id number] Arrival date & time: 06/24/17  1216     History   Chief Complaint Chief Complaint  Patient presents with  . Wheezing    HPI Linda Ellis is a 41 y.o. female.  HPI  41 year old female history of asthma presents today complaining of increased coughing and wheezing over the past 2 days.  She describes cough productive of clear sputum.  She has been using her albuterol a single right and ran out of it this morning.  Has some runny nose, sore throat, and congestion.  She has not had any definite fever or chills.  She has been eating and drinking without difficulty.  She has a history of asthma and has been hospitalized multiple times for her asthma.  She was a smoker but quit approximately 6 months ago.  She currently denies pain but does feel somewhat dyspneic.  Past Medical History:  Diagnosis Date  . Asthma   . Multiple gastric ulcers     There are no active problems to display for this patient.   Past Surgical History:  Procedure Laterality Date  . CHOLECYSTECTOMY    . DIAGNOSTIC LAPAROSCOPY WITH REMOVAL OF ECTOPIC PREGNANCY Right 01/24/2017   Procedure: REMOVAL OF ECTOPIC PREGNANCY;  Surgeon: Florian Buff, MD;  Location: [facility];  Service: Gynecology;  Laterality: Right;  . LAPAROSCOPIC UNILATERAL SALPINGECTOMY Right 01/24/2017   Procedure: LAPAROSCOPIC UNILATERAL SALPINGECTOMY;  Surgeon: Florian Buff, MD;  Location: [facility];  Service: Gynecology;  Laterality: Right;    OB History    Gravida Para Term Preterm AB Living   5 4 4     4    SAB TAB Ectopic Multiple Live Births                   Home Medications    Prior to Admission medications   Medication Sig Start Date End Date Taking? Authorizing Provider  acetaminophen (TYLENOL) 500 MG tablet Take 1,000 mg by mouth every 6 (six) hours as needed. Pain.    [provider]  albuterol (PROVENTIL HFA;VENTOLIN HFA) 108 (90 Base) MCG/ACT inhaler  Inhale 2 puffs into the lungs every 4 (four) hours as needed for wheezing or shortness of breath. 05/31/17   Horton, Barbette Hair, MD  beclomethasone (QVAR) 80 MCG/ACT inhaler Inhale 2 puffs into the lungs 2 (two) times daily.    [provider]  predniSONE (DELTASONE) 10 MG tablet Take 5 tablets by mouth times 3 days, take 4 tablets by mouth times 3 days, take 3 tablets by mouth times 3 days, take 2 tablets by mouth times 3 days, take 1 tablet by mouth times 3 days, take 1/2 tablet by mouth times 3 days 05/31/17   Horton, Barbette Hair, MD  ranitidine (ZANTAC) 150 MG tablet Take 150-300 mg by mouth daily as needed for heartburn.     [provider]    Family History Family History  Problem Relation Age of Onset  . Diabetes Maternal Grandfather   . Heart attack Maternal Grandfather     Social History Social History   Tobacco Use  . Smoking status: Former Smoker    Packs/day: 0.50    Years: 22.00    Pack years: 11.00    Types: Cigarettes    Last attempt to quit: 03/21/2017    Years since quitting: 0.2  . Smokeless tobacco: Never Used  Substance Use Topics  . Alcohol use: Yes    Comment: occasionally  .  Drug use: No     Allergies   Patient has no known allergies.   Review of Systems Review of Systems  Constitutional: Negative.   HENT: Positive for congestion.   Eyes: Negative.   Respiratory: Positive for cough and shortness of breath.   Cardiovascular: Negative.   Gastrointestinal: Negative.   Endocrine: Negative.   Genitourinary: Negative.   Musculoskeletal: Negative.   Skin: Negative.   Allergic/Immunologic: Negative.   Neurological: Negative.   Hematological: Negative.   Psychiatric/Behavioral: Negative.   All other systems reviewed and are negative.    Physical Exam Updated Vital Signs BP (!) 142/92 (BP Location: Left Arm)   Pulse (!) 111   Temp 98.5 F (36.9 C) (Oral)   Resp (!) 25   Ht 1.651 m (5\' 5" )   Wt 117.9 kg (260 lb)   LMP  06/10/2017   SpO2 96%   BMI 43.27 kg/m   Physical Exam  Constitutional: She is oriented to person, place, and time. She appears well-developed and well-nourished. She appears distressed.  HENT:  Head: Normocephalic and atraumatic.  Right Ear: External ear normal.  Left Ear: External ear normal.  Nose: Nose normal.  Mouth/Throat: Oropharynx is clear and moist.  Eyes: Pupils are equal, round, and reactive to light.  Neck: Normal range of motion.  Cardiovascular: Tachycardia present.  Pulmonary/Chest: She has wheezes.  Increased work of breathing with retractions and increased respiratory rate Use inspiratory and expiratory wheezes and rhonchi  Musculoskeletal: Normal range of motion.  Neurological: She is alert and oriented to person, place, and time.  Skin: Skin is warm and dry. Capillary refill takes less than 2 seconds.  Psychiatric: She has a normal mood and affect.  Nursing note and vitals reviewed.    ED Treatments / Results  Labs (all labs ordered are listed, but only abnormal results are displayed) Labs Reviewed  CBC  BASIC METABOLIC PANEL    EKG  EKG Interpretation  Date/Time:  Thursday June 24 2017 12:49:36 EST Ventricular Rate:  91 PR Interval:    QRS Duration: 100 QT Interval:  367 QTC Calculation: 452 R Axis:   88 Text Interpretation:  Sinus rhythm Low voltage, precordial leads No significant change since last tracing Confirmed by Virgel Manifold [phone]) on 06/24/2017 12:53:07 PM       Radiology Dg Chest Port 1 View  Result Date: 06/24/2017 CLINICAL DATA:  Wheezing.  Short of breath. EXAM: PORTABLE CHEST 1 VIEW COMPARISON:  05/21/2017 FINDINGS: The study is limited by overlying body habitus. Opacity at the left base may simply be due to cardiomegaly. Consolidation is not excluded. No obvious pneumothorax. Normal heart size. IMPRESSION: Limited exam.  Consolidation at the left base is not excluded. Electronically Signed   By: Marybelle Killings M.D.   On:  06/24/2017 12:53    Procedures Procedures (including critical care time)  Medications Ordered in ED Medications  albuterol (PROVENTIL,VENTOLIN) solution continuous neb (10 mg/hr Nebulization Given 06/24/17 1245)  ipratropium-albuterol (DUONEB) 0.5-2.5 (3) MG/3ML nebulizer solution 3 mL (3 mLs Nebulization Given 06/24/17 1233)  ipratropium (ATROVENT) nebulizer solution 0.5 mg (0.5 mg Nebulization Given 06/24/17 1245)  methylPREDNISolone sodium succinate (SOLU-MEDROL) 125 mg/2 mL injection 125 mg (125 mg Intravenous Given 06/24/17 1251)     Initial Impression / Assessment and Plan / ED Course  I have reviewed the triage vital signs and the nursing notes.  Pertinent labs & imaging results that were available during my care of the patient were reviewed by me and considered in my  medical decision making (see chart for details).     1:23 PM Recheck-    Improved.  Oxygen saturations 98% on monitor heart rate continues 110.  She continues to have diffuse inspiratory and expiratory wheezes but has increased air movement 2:24 PM Patient's continuous neb has been completed.  She feels somewhat improved.  Sats are 92% on nasal cannula.  She continues to have some inspiratory and expiratory wheezes but air movement is improved.  Plan admission for further treatment With Dr. Allyson Sabal and she will see for admission  CRITICAL CARE Performed by: Pattricia Boss Total critical care time: 30 minutes Critical care time was exclusive of separately billable procedures and treating other patients. Critical care was necessary to treat or prevent imminent or life-threatening deterioration. Critical care was time spent personally by me on the following activities: development of treatment plan with patient and/or surrogate as well as nursing, discussions with consultants, evaluation of patient's response to treatment, examination of patient, obtaining history from patient or surrogate, ordering and performing  treatments and interventions, ordering and review of laboratory studies, ordering and review of radiographic studies, pulse oximetry and re-evaluation of patient's condition.   2:51 PM Dr. Rockney Ghee came to see patient and patient states that she is feeling much better.  She continues to have some expiratory wheezes.  She is maintaining sats at 94% after walking.  We both saw the patient together and the patient wishes to be discharged.  We have discussed return precautions specifically worsening dyspnea or fever.  She will be discharged on prednisone given albuterol and Adderall HFA and Zithromax. Final Clinical Impressions(s) / ED Diagnoses   Final diagnoses:  Community acquired pneumonia of left lower lobe of lung ([place])  Severe persistent asthma with exacerbation    ED Discharge Orders        Ordered    predniSONE (DELTASONE) 10 MG tablet  2 times daily with meals     06/24/17 1454    azithromycin (ZITHROMAX Z-PAK) 250 MG tablet  Daily     06/24/17 1454       Pattricia Boss, MD 06/24/17 1433    Pattricia Boss, MD 06/24/17 1452    Pattricia Boss, MD 06/24/17 1454

## 2017-07-03 ENCOUNTER — Emergency Department (HOSPITAL_COMMUNITY)
Admission: EM | Admit: 2017-07-03 | Discharge: 2017-07-03 | Disposition: A | Discharge status: Home / Self Care | Payer: Self-pay | Attending: Emergency Medicine | Admitting: Emergency Medicine

## 2017-07-03 ENCOUNTER — Encounter (HOSPITAL_COMMUNITY): Payer: Self-pay | Admitting: *Deleted

## 2017-07-03 ENCOUNTER — Emergency Department (HOSPITAL_COMMUNITY): Payer: Self-pay

## 2017-07-03 DIAGNOSIS — J4541 Moderate persistent asthma with (acute) exacerbation: Secondary | ICD-10-CM | POA: Insufficient documentation

## 2017-07-03 DIAGNOSIS — J449 Chronic obstructive pulmonary disease, unspecified: Secondary | ICD-10-CM | POA: Insufficient documentation

## 2017-07-03 DIAGNOSIS — Z87891 Personal history of nicotine dependence: Secondary | ICD-10-CM | POA: Insufficient documentation

## 2017-07-03 HISTORY — DX: Chronic obstructive pulmonary disease, unspecified: J44.9

## 2017-07-03 LAB — CBC WITH DIFFERENTIAL/PLATELET
BASOS ABS: 0.1 10*3/uL (ref 0.0–0.1)
BASOS PCT: 1 %
EOS ABS: 0.3 10*3/uL (ref 0.0–0.7)
EOS PCT: 2 %
HCT: 44.3 % (ref 36.0–46.0)
Hemoglobin: 14.3 g/dL (ref 12.0–15.0)
Lymphocytes Relative: 38 %
Lymphs Abs: 4 10*3/uL (ref 0.7–4.0)
MCH: 30.2 pg (ref 26.0–34.0)
MCHC: 32.3 g/dL (ref 30.0–36.0)
MCV: 93.7 fL (ref 78.0–100.0)
Monocytes Absolute: 0.6 10*3/uL (ref 0.1–1.0)
Monocytes Relative: 6 %
NEUTROS PCT: 53 %
Neutro Abs: 5.6 10*3/uL (ref 1.7–7.7)
PLATELETS: 389 10*3/uL (ref 150–400)
RBC: 4.73 MIL/uL (ref 3.87–5.11)
RDW: 13.3 % (ref 11.5–15.5)
WBC: 10.5 10*3/uL (ref 4.0–10.5)

## 2017-07-03 LAB — BASIC METABOLIC PANEL
ANION GAP: 9 (ref 5–15)
BUN: 9 mg/dL (ref 6–20)
CALCIUM: 9 mg/dL (ref 8.9–10.3)
CO2: 22 mmol/L (ref 22–32)
Chloride: 105 mmol/L (ref 101–111)
Creatinine, Ser: 0.7 mg/dL (ref 0.44–1.00)
GLUCOSE: 133 mg/dL — AB (ref 65–99)
Potassium: 3.9 mmol/L (ref 3.5–5.1)
SODIUM: 136 mmol/L (ref 135–145)

## 2017-07-03 LAB — TROPONIN I

## 2017-07-03 MED ORDER — ALBUTEROL SULFATE HFA 108 (90 BASE) MCG/ACT IN AERS: 1.0000 | INHALATION_SPRAY | Freq: Four times a day (QID) | RESPIRATORY_TRACT | 0 refills | Status: DC | PRN

## 2017-07-03 MED ORDER — SODIUM CHLORIDE 0.9 % IV BOLUS (SEPSIS)
1000.0000 mL | Freq: Once | INTRAVENOUS | Status: AC
  Administered 2017-07-03: 1000 mL via INTRAVENOUS

## 2017-07-03 MED ORDER — IPRATROPIUM-ALBUTEROL 0.5-2.5 (3) MG/3ML IN SOLN: 3.0000 mL | Freq: Once | RESPIRATORY_TRACT | Status: DC

## 2017-07-03 MED ORDER — ALBUTEROL SULFATE (5 MG/ML) 0.5% IN NEBU: 2.5000 mg | INHALATION_SOLUTION | Freq: Four times a day (QID) | RESPIRATORY_TRACT | 12 refills | Status: DC | PRN

## 2017-07-03 MED ORDER — PREDNISONE 10 MG (21) PO TBPK: ORAL_TABLET | Freq: Every day | ORAL | 0 refills | Status: DC

## 2017-07-03 MED ORDER — ALBUTEROL (5 MG/ML) CONTINUOUS INHALATION SOLN
15.0000 mg/h | INHALATION_SOLUTION | RESPIRATORY_TRACT | Status: DC
  Administered 2017-07-03: 15 mg/h via RESPIRATORY_TRACT
  Filled 2017-07-03: qty 20

## 2017-07-03 MED ORDER — PREDNISONE 50 MG PO TABS
60.0000 mg | ORAL_TABLET | Freq: Once | ORAL | Status: AC
  Administered 2017-07-03: 60 mg via ORAL
  Filled 2017-07-03: qty 1

## 2017-07-03 NOTE — ED Provider Notes (Signed)
Emergency Department Provider Note   I have reviewed the triage vital signs and the nursing notes.   HISTORY  Chief Complaint Shortness of Breath   HPI Linda Ellis is a 41 y.o. female with PMH of asthma, 30-pack year smoking history but stopped in July 2018, and recent CAP diagnosis with abx resents to the emergency department for evaluation of dry cough, shortness of breath, and wheezing over the past several days.  No sick contacts.  Denies any hemoptysis.  She states that she completed her antibiotics course from her recent community-acquired pneumonia and has been using the steroids as prescribed.  She states that while on steroids she feels much better but as soon as she goes off steroids she has return of her breathing symptoms.  She has not started smoking again.  She denies any vomiting or diarrhea.  She has some chest discomfort with coughing only.    Past Medical History:  Diagnosis Date  . Asthma   . COPD (chronic obstructive pulmonary disease) ([place])   . Multiple gastric ulcers     Patient Active Problem List   Diagnosis Date Noted  . CAP (community acquired pneumonia) 06/24/2017  . Asthma, chronic, unspecified asthma severity, with acute exacerbation 06/24/2017  . Acute asthma exacerbation 06/24/2017    Past Surgical History:  Procedure Laterality Date  . CHOLECYSTECTOMY    . DIAGNOSTIC LAPAROSCOPY WITH REMOVAL OF ECTOPIC PREGNANCY Right 01/24/2017   Procedure: REMOVAL OF ECTOPIC PREGNANCY;  Surgeon: Florian Buff, MD;  Location: [facility];  Service: Gynecology;  Laterality: Right;  . LAPAROSCOPIC UNILATERAL SALPINGECTOMY Right 01/24/2017   Procedure: LAPAROSCOPIC UNILATERAL SALPINGECTOMY;  Surgeon: Florian Buff, MD;  Location: [facility];  Service: Gynecology;  Laterality: Right;    Current Outpatient Rx  . Order #: [id number] Class: Historical Med  . Order #: [id number] Class: Print  . Order #: [id number] Class: Historical Med  . Order #: [id number] Class: Print  .  Order #: [id number] Class: Print  . [START ON 07/04/2017] Order #: [id number] Class: Print    Allergies Patient has no known allergies.  Family History  Problem Relation Age of Onset  . Diabetes Maternal Grandfather   . Heart attack Maternal Grandfather     Social History Social History   Tobacco Use  . Smoking status: Former Smoker    Packs/day: 0.50    Years: 22.00    Pack years: 11.00    Types: Cigarettes    Last attempt to quit: 03/21/2017    Years since quitting: 0.2  . Smokeless tobacco: Never Used  Substance Use Topics  . Alcohol use: Yes    Comment: occasionally  . Drug use: No    Review of Systems  Constitutional: No fever/chills Eyes: No visual changes. ENT: No sore throat. Cardiovascular: Positive chest pain with coughing only.  Respiratory: Positive shortness of breath and dry cough.  Gastrointestinal: No abdominal pain.  No nausea, no vomiting.  No diarrhea.  No constipation. Genitourinary: Negative for dysuria. Musculoskeletal: Negative for back pain. Skin: Negative for rash. Neurological: Negative for headaches, focal weakness or numbness.  10-point ROS otherwise negative.  ____________________________________________   PHYSICAL EXAM:  VITAL SIGNS: ED Triage Vitals  Enc Vitals Group     BP 07/03/17 1236 124/86     Pulse Rate 07/03/17 1236 (!) 118     Resp 07/03/17 1239 18     Temp 07/03/17 1239 98 F (36.7 C)     Temp Source 07/03/17 1239 Oral     SpO2 07/03/17 1236  97 %     Weight 07/03/17 1234 260 lb (117.9 kg)     Height 07/03/17 1234 5\' 5"  (1.651 m)     Pain Score 07/03/17 1234 6   Constitutional: Alert and oriented. Well appearing and in no acute distress. Eyes: Conjunctivae are normal.  Head: Atraumatic. Nose: No congestion/rhinnorhea. Mouth/Throat: Mucous membranes are moist.  Oropharynx non-erythematous. Neck: No stridor. Cardiovascular: Tachycardia. Good peripheral circulation. Grossly normal heart sounds.   Respiratory:  Increased respiratory effort.  No retractions. Lungs with diffuse inspiratory and expiratory wheezing.  Gastrointestinal: Soft and nontender. No distention.  Musculoskeletal: No lower extremity tenderness nor edema. No gross deformities of extremities. Neurologic:  Normal speech and language. No gross focal neurologic deficits are appreciated.  Skin:  Skin is warm, dry and intact. No rash noted.   ____________________________________________   LABS (all labs ordered are listed, but only abnormal results are displayed)  Labs Reviewed  BASIC METABOLIC PANEL - Abnormal; Notable for the following components:      Result Value   Glucose, Bld 133 (*)    All other components within normal limits  CBC WITH DIFFERENTIAL/PLATELET  TROPONIN I   ____________________________________________  EKG   EKG Interpretation  Date/Time:  Saturday July 03 2017 12:38:32 EST Ventricular Rate:  116 PR Interval:    QRS Duration: 99 QT Interval:  318 QTC Calculation: 442 R Axis:   103 Text Interpretation:  Sinus tachycardia Atrial premature complex Biatrial enlargement Consider right ventricular hypertrophy Baseline wander in lead(s) II III aVF V5 V6 No STEMI.  Confirmed by Nanda Quinton [phone]) on 07/03/2017 12:47:30 PM      ____________________________________________  RADIOLOGY  Dg Chest 2 View  Result Date: 07/03/2017 CLINICAL DATA:  Shortness of breath and cough. EXAM: CHEST  2 VIEW COMPARISON:  Chest x-ray dated June 24, 2017. FINDINGS: The cardiomediastinal silhouette is normal in size. Normal pulmonary vascularity. Bibasilar atelectasis. No focal consolidation, pleural effusion, or pneumothorax. No acute osseous abnormality. IMPRESSION: No active cardiopulmonary disease. Electronically Signed   By: Titus Dubin M.D.   On: 07/03/2017 13:16    ____________________________________________   PROCEDURES  Procedure(s) performed:   Procedures  CRITICAL CARE Performed by: Margette Fast Total critical care time: 40 minutes Critical care time was exclusive of separately billable procedures and treating other patients. Critical care was necessary to treat or prevent imminent or life-threatening deterioration. Critical care was time spent personally by me on the following activities: development of treatment plan with patient and/or surrogate as well as nursing, discussions with consultants, evaluation of patient's response to treatment, examination of patient, obtaining history from patient or surrogate, ordering and performing treatments and interventions, ordering and review of laboratory studies, ordering and review of radiographic studies, pulse oximetry and re-evaluation of patient's condition.  Nanda Quinton, MD Emergency Medicine  ____________________________________________   INITIAL IMPRESSION / ASSESSMENT AND PLAN / ED COURSE  Pertinent labs & imaging results that were available during my care of the patient were reviewed by me and considered in my medical decision making (see chart for details).  Patient presents emergency department for evaluation of difficulty breathing with dry cough and chest pain with coughing only.  She has history of asthma.  Was treated as an outpatient for community-acquired pneumonia last week.  She completed her antibiotics and steroids but started to feel worse when therapy was complete.  She has moderate increased work of breathing with inspiratory and expiratory wheezing on exam.  No hypoxemia.  Patient does have some  tachycardia but is afebrile.  Plan for continuous nebulizer, chest x-ray to rule out return of pneumonia, likely steroid and reassessment.   01:55 PM Patient toward the end of 1 hour CAT. Feeling better. No infiltrate on CXR. Plan for ED obs and reassess.   03:39 PM Patient continues to feel well. Plan for discharge with albuterol, steroid, and PCP follow up information. Wrote a DME Rx for albuterol nebulizer machine.    At this time, I do not feel there is any life-threatening condition present. I have reviewed and discussed all results (EKG, imaging, lab, urine as appropriate), exam findings with patient. I have reviewed nursing notes and appropriate previous records.  I feel the patient is safe to be discharged home without further emergent workup. Discussed usual and customary return precautions. Patient and family (if present) verbalize understanding and are comfortable with this plan.  Patient will follow-up with their primary care provider. If they do not have a primary care provider, information for follow-up has been provided to them. All questions have been answered.  ____________________________________________  FINAL CLINICAL IMPRESSION(S) / ED DIAGNOSES  Final diagnoses:  Moderate persistent asthma with exacerbation     MEDICATIONS GIVEN DURING THIS VISIT:  Medications  albuterol (PROVENTIL,VENTOLIN) solution continuous neb (15 mg/hr Nebulization New Bag/Given 07/03/17 1324)  ipratropium-albuterol (DUONEB) 0.5-2.5 (3) MG/3ML nebulizer solution 3 mL (not administered)  sodium chloride 0.9 % bolus 1,000 mL (1,000 mLs Intravenous New Bag/Given 07/03/17 1406)  predniSONE (DELTASONE) tablet 60 mg (60 mg Oral Given 07/03/17 1405)     NEW OUTPATIENT MEDICATIONS STARTED DURING THIS VISIT:  Albuterol inh, neb solution, and Prednisone dose pack.   Note:  This document was prepared using Dragon voice recognition software and may include unintentional dictation errors.  Nanda Quinton, MD Emergency Medicine    Ekansh Sherk, [facility], MD 07/03/17 [phone]

## 2017-07-03 NOTE — ED Triage Notes (Signed)
Pt with sob since yesterday morning with np cough, denies fever.  Recent dx with PNA.

## 2017-07-03 NOTE — Discharge Instructions (Signed)
We believe that your symptoms are caused today by an exacerbation of your asthma.  Please take the prescribed medications and any medications that you have at home.  Follow up with your doctor as recommended.  If you develop any new or worsening symptoms, including but not limited to fever, persistent vomiting, worsening shortness of breath, or other symptoms that concern you, please return to the Emergency Department immediately. ° ° °Asthma °Asthma is a recurring condition in which the airways tighten and narrow. Asthma can make it difficult to breathe. It can cause coughing, wheezing, and shortness of breath. Asthma episodes, also called asthma attacks, range from minor to life-threatening. Asthma cannot be cured, but medicines and lifestyle changes can help control it. °CAUSES °Asthma is believed to be caused by inherited (genetic) and environmental factors, but its exact cause is unknown. Asthma may be triggered by allergens, lung infections, or irritants in the air. Asthma triggers are different for each person. Common triggers include:  °Animal dander. °Dust mites. °Cockroaches. °Pollen from trees or grass. °Mold. °Smoke. °Air pollutants such as dust, household cleaners, hair sprays, aerosol sprays, paint fumes, strong chemicals, or strong odors. °Cold air, weather changes, and winds (which increase molds and pollens in the air). °Strong emotional expressions such as crying or laughing hard. °Stress. °Certain medicines (such as aspirin) or types of drugs (such as beta-blockers). °Sulfites in foods and drinks. Foods and drinks that may contain sulfites include dried fruit, potato chips, and sparkling grape juice. °Infections or inflammatory conditions such as the flu, a cold, or an inflammation of the nasal membranes (rhinitis). °Gastroesophageal reflux disease (GERD). °Exercise or strenuous activity. °SYMPTOMS °Symptoms may occur immediately after asthma is triggered or many hours later. Symptoms  include: °Wheezing. °Excessive nighttime or early morning coughing. °Frequent or severe coughing with a common cold. °Chest tightness. °Shortness of breath. °DIAGNOSIS  °The diagnosis of asthma is made by a review of your medical history and a physical exam. Tests may also be performed. These may include: °Lung function studies. These tests show how much air you breathe in and out. °Allergy tests. °Imaging tests such as X-rays. °TREATMENT  °Asthma cannot be cured, but it can usually be controlled. Treatment involves identifying and avoiding your asthma triggers. It also involves medicines. There are 2 classes of medicine used for asthma treatment:  °Controller medicines. These prevent asthma symptoms from occurring. They are usually taken every day. °Reliever or rescue medicines. These quickly relieve asthma symptoms. They are used as needed and provide short-term relief. °Your health care provider will help you create an asthma action plan. An asthma action plan is a written plan for managing and treating your asthma attacks. It includes a list of your asthma triggers and how they may be avoided. It also includes information on when medicines should be taken and when their dosage should be changed. An action plan may also involve the use of a device called a peak flow meter. A peak flow meter measures how well the lungs are working. It helps you monitor your condition. °HOME CARE INSTRUCTIONS  °Take medicines only as directed by your health care provider. Speak with your health care provider if you have questions about how or when to take the medicines. °Use a peak flow meter as directed by your health care provider. Record and keep track of readings. °Understand and use the action plan to help minimize or stop an asthma attack without needing to seek medical care. °Control your home environment in the following   ways to help prevent asthma attacks: °Do not smoke. Avoid being exposed to secondhand smoke. °Change  your heating and air conditioning filter regularly. °Limit your use of fireplaces and wood stoves. °Get rid of pests (such as roaches and mice) and their droppings. °Throw away plants if you see mold on them. °Clean your floors and dust regularly. Use unscented cleaning products. °Try to have someone else vacuum for you regularly. Stay out of rooms while they are being vacuumed and for a short while afterward. If you vacuum, use a dust mask from a hardware store, a double-layered or microfilter vacuum cleaner bag, or a vacuum cleaner with a HEPA filter. °Replace carpet with wood, tile, or vinyl flooring. Carpet can trap dander and dust. °Use allergy-proof pillows, mattress covers, and box spring covers. °Wash bed sheets and blankets every week in hot water and dry them in a dryer. °Use blankets that are made of polyester or cotton. °Clean bathrooms and kitchens with bleach. If possible, have someone repaint the walls in these rooms with mold-resistant paint. Keep out of the rooms that are being cleaned and painted. °Wash hands frequently. °SEEK MEDICAL CARE IF:  °You have wheezing, shortness of breath, or a cough even if taking medicine to prevent attacks. °The colored mucus you cough up (sputum) is thicker than usual. °Your sputum changes from clear or white to yellow, green, gray, or bloody. °You have any problems that may be related to the medicines you are taking (such as a rash, itching, swelling, or trouble breathing). °You are using a reliever medicine more than 2-3 times per week. °Your peak flow is still at 50-79% of your personal best after following your action plan for 1 hour. °You have a fever. °SEEK IMMEDIATE MEDICAL CARE IF:  °You seem to be getting worse and are unresponsive to treatment during an asthma attack. °You are short of breath even at rest. °You get short of breath when doing very little physical activity. °You have difficulty eating, drinking, or talking due to asthma symptoms. °You  develop chest pain. °You develop a fast heartbeat. °You have a bluish color to your lips or fingernails. °You are light-headed, dizzy, or faint. °Your peak flow is less than 50% of your personal best. °MAKE SURE YOU:  °Understand these instructions. °Will watch your condition. °Will get help right away if you are not doing well or get worse. °Document Released: 07/20/2005 Document Revised: 12/04/2013 Document Reviewed: 02/16/2013 °[employer]® Patient Information ©2015 [employer]. This information is not intended to replace advice given to you by your health care provider. Make sure you discuss any questions you have with your health care provider. ° °How to Use a Nebulizer °If you have asthma or other breathing problems, you might need to breathe in (inhale) medicine. This can be done with a nebulizer. A nebulizer is a device that turns liquid medicine into a mist that you can inhale.  °There are different kinds of nebulizers. Most are small. With some, you breathe in through a mouthpiece. With others, a mask fits over your nose and mouth. Most nebulizers must be connected to a small air compressor. Air is forced through tubing from the compressor to the nebulizer. The forced air changes the liquid into a fine spray. °RISKS AND COMPLICATIONS °The nebulizer must work properly for it to help your breathing. If the nebulizer does not produce mist, or if foam comes out, this indicates that the nebulizer is not working properly. Sometimes a filter can get clogged, or   there might be a problem with the air compressor. Check the instruction booklet that came with your nebulizer. It should tell you how to fix problems or where to call for help. You should have at least one extra nebulizer at home. That way, you will always have one when you need it.  °HOW TO PREPARE BEFORE USING THE NEBULIZER °Take these steps before using the nebulizer: °Check your medicine. Make sure it has not expired and is not damaged in any way.    °Wash your hands with soap and water.   °Put all the parts of your nebulizer on a sturdy, flat surface. Make sure the tubing connects the compressor and the nebulizer. °Measure the liquid medicine according to your health care provider's instructions. Pour it into the nebulizer. °Attach the mouthpiece or mask.   °Test the nebulizer by turning it on to make sure a spray is coming out. Then, turn it off.   °HOW TO USE THE NEBULIZER °Sit down and focus on staying relaxed.   °If your nebulizer has a mask, put it over your nose and mouth. If you use a mouthpiece, put it in your mouth. Press your lips firmly around the mouthpiece. °Turn on the nebulizer.   °Breathe out.   °Some nebulizers have a finger valve. If yours does, cover up the air hole so the air gets to the nebulizer. °Once the medicine begins to mist out, take slow, deep breaths. If there is a finger valve, release it at the end of your breath. °Continue taking slow, deep breaths until the nebulizer is empty.   °Be sure to stop the machine at any point if you start coughing or if the medicine foams or bubbles. °HOW TO CLEAN THE NEBULIZER  °The nebulizer and all its parts must be kept very clean. Follow the manufacturer's instructions for cleaning. For most nebulizers, you should follow these guidelines: °Wash the nebulizer after each use. Use warm water and soap. Rinse it well. Shake the nebulizer to remove extra water. Put it on a clean towel until it is completely dry. To make sure it is dry, put the nebulizer back together. Turn on the compressor for a few minutes. This will blow air through the nebulizer.   °Do not wash the tubing or the finger valve.   °Store the nebulizer in a dust-free place.   °Inspect the filter every week. Replace it any time it looks dirty.   °Sometimes the nebulizer will need a more complete cleaning. The instruction booklet should say how often you need to do this. °SEEK MEDICAL CARE IF:  °You continue to have difficulty  breathing.   °You have trouble using the nebulizer.   °Document Released: 07/08/2009 Document Revised: 12/04/2013 Document Reviewed: 01/09/2013 °[employer]® Patient Information ©2015 [employer]. This information is not intended to replace advice given to you by your health care provider. Make sure you discuss any questions you have with your health care provider. ° °How to Use an Inhaler °Proper inhaler technique is very important. Good technique ensures that the medicine reaches the lungs. Poor technique results in depositing the medicine on the tongue and back of the throat rather than in the airways. If you do not use the inhaler with good technique, the medicine will not help you. °STEPS TO FOLLOW IF USING AN INHALER WITHOUT AN EXTENSION TUBE °Remove the cap from the inhaler. °If you are using the inhaler for the first time, you will need to prime it. Shake the inhaler for   5 seconds and release four puffs into the air, away from your face. Ask your health care provider or pharmacist if you have questions about priming your inhaler. °Shake the inhaler for 5 seconds before each breath in (inhalation). °Position the inhaler so that the top of the canister faces up. °Put your index finger on the top of the medicine canister. Your thumb supports the bottom of the inhaler. °Open your mouth. °Either place the inhaler between your teeth and place your lips tightly around the mouthpiece, or hold the inhaler 1-2 inches away from your open mouth. If you are unsure of which technique to use, ask your health care provider. °Breathe out (exhale) normally and as completely as possible. °Press the canister down with your index finger to release the medicine. °At the same time as the canister is pressed, inhale deeply and slowly until your lungs are completely filled. This should take 4-6 seconds. Keep your tongue down. °Hold the medicine in your lungs for 5-10 seconds (10 seconds is best). This helps the medicine get into the  small airways of your lungs. °Breathe out slowly, through pursed lips. Whistling is an example of pursed lips. °Wait at least 15-30 seconds between puffs. Continue with the above steps until you have taken the number of puffs your health care provider has ordered. Do not use the inhaler more than your health care provider tells you. °Replace the cap on the inhaler. °Follow the directions from your health care provider or the inhaler insert for cleaning the inhaler. °STEPS TO FOLLOW IF USING AN INHALER WITH AN EXTENSION (SPACER) °Remove the cap from the inhaler. °If you are using the inhaler for the first time, you will need to prime it. Shake the inhaler for 5 seconds and release four puffs into the air, away from your face. Ask your health care provider or pharmacist if you have questions about priming your inhaler. °Shake the inhaler for 5 seconds before each breath in (inhalation). °Place the open end of the spacer onto the mouthpiece of the inhaler. °Position the inhaler so that the top of the canister faces up and the spacer mouthpiece faces you. °Put your index finger on the top of the medicine canister. Your thumb supports the bottom of the inhaler and the spacer. °Breathe out (exhale) normally and as completely as possible. °Immediately after exhaling, place the spacer between your teeth and into your mouth. Close your lips tightly around the spacer. °Press the canister down with your index finger to release the medicine. °At the same time as the canister is pressed, inhale deeply and slowly until your lungs are completely filled. This should take 4-6 seconds. Keep your tongue down and out of the way. °Hold the medicine in your lungs for 5-10 seconds (10 seconds is best). This helps the medicine get into the small airways of your lungs. Exhale. °Repeat inhaling deeply through the spacer mouthpiece. Again hold that breath for up to 10 seconds (10 seconds is best). Exhale slowly. If it is difficult to take  this second deep breath through the spacer, breathe normally several times through the spacer. Remove the spacer from your mouth. °Wait at least 15-30 seconds between puffs. Continue with the above steps until you have taken the number of puffs your health care provider has ordered. Do not use the inhaler more than your health care provider tells you. °Remove the spacer from the inhaler, and place the cap on the inhaler. °Follow the directions from your health care provider   or the inhaler insert for cleaning the inhaler and spacer. °If you are using different kinds of inhalers, use your quick relief medicine to open the airways 10-15 minutes before using a steroid if instructed to do so by your health care provider. If you are unsure which inhalers to use and the order of using them, ask your health care provider, nurse, or respiratory therapist. °If you are using a steroid inhaler, always rinse your mouth with water after your last puff, then gargle and spit out the water. Do not swallow the water. °AVOID: °Inhaling before or after starting the spray of medicine. It takes practice to coordinate your breathing with triggering the spray. °Inhaling through the nose (rather than the mouth) when triggering the spray. °HOW TO DETERMINE IF YOUR INHALER IS FULL OR NEARLY EMPTY °You cannot know when an inhaler is empty by shaking it. A few inhalers are now being made with dose counters. Ask your health care provider for a prescription that has a dose counter if you feel you need that extra help. If your inhaler does not have a counter, ask your health care provider to help you determine the date you need to refill your inhaler. Write the refill date on a calendar or your inhaler canister. Refill your inhaler 7-10 days before it runs out. Be sure to keep an adequate supply of medicine. This includes making sure it is not expired, and that you have a spare inhaler.  °SEEK MEDICAL CARE IF:  °Your symptoms are only partially  relieved with your inhaler. °You are having trouble using your inhaler. °You have some increase in phlegm. °SEEK IMMEDIATE MEDICAL CARE IF:  °You feel little or no relief with your inhalers. You are still wheezing and are feeling shortness of breath or tightness in your chest or both. °You have dizziness, headaches, or a fast heart rate. °You have chills, fever, or night sweats. °You have a noticeable increase in phlegm production, or there is blood in the phlegm. °MAKE SURE YOU:  °Understand these instructions. °Will watch your condition. °Will get help right away if you are not doing well or get worse. °Document Released: 07/17/2000 Document Revised: 05/10/2013 Document Reviewed: 02/16/2013 °[employer]® Patient Information ©2015 [employer]. This information is not intended to replace advice given to you by your health care provider. Make sure you discuss any questions you have with your health care provider. ° ° ° °

## 2017-07-22 ENCOUNTER — Other Ambulatory Visit: Payer: Self-pay

## 2017-07-22 ENCOUNTER — Encounter (HOSPITAL_COMMUNITY): Payer: Self-pay

## 2017-07-22 ENCOUNTER — Emergency Department (HOSPITAL_COMMUNITY)
Admission: EM | Admit: 2017-07-22 | Discharge: 2017-07-22 | Disposition: A | Discharge status: Home / Self Care | Payer: Self-pay | Attending: Emergency Medicine | Admitting: Emergency Medicine

## 2017-07-22 DIAGNOSIS — J449 Chronic obstructive pulmonary disease, unspecified: Secondary | ICD-10-CM | POA: Insufficient documentation

## 2017-07-22 DIAGNOSIS — Z79899 Other long term (current) drug therapy: Secondary | ICD-10-CM | POA: Insufficient documentation

## 2017-07-22 DIAGNOSIS — J4541 Moderate persistent asthma with (acute) exacerbation: Secondary | ICD-10-CM | POA: Insufficient documentation

## 2017-07-22 DIAGNOSIS — Z87891 Personal history of nicotine dependence: Secondary | ICD-10-CM | POA: Insufficient documentation

## 2017-07-22 MED ORDER — ALBUTEROL SULFATE (2.5 MG/3ML) 0.083% IN NEBU
2.5000 mg | INHALATION_SOLUTION | Freq: Once | RESPIRATORY_TRACT | Status: AC
  Administered 2017-07-22: 2.5 mg via RESPIRATORY_TRACT

## 2017-07-22 MED ORDER — ALBUTEROL SULFATE (2.5 MG/3ML) 0.083% IN NEBU
2.5000 mg | INHALATION_SOLUTION | Freq: Once | RESPIRATORY_TRACT | Status: AC
  Administered 2017-07-22: 2.5 mg via RESPIRATORY_TRACT
  Filled 2017-07-22: qty 3

## 2017-07-22 MED ORDER — IPRATROPIUM-ALBUTEROL 0.5-2.5 (3) MG/3ML IN SOLN
3.0000 mL | Freq: Once | RESPIRATORY_TRACT | Status: AC
  Administered 2017-07-22: 3 mL via RESPIRATORY_TRACT
  Filled 2017-07-22: qty 3

## 2017-07-22 MED ORDER — ALBUTEROL SULFATE (2.5 MG/3ML) 0.083% IN NEBU
INHALATION_SOLUTION | RESPIRATORY_TRACT | Status: AC
  Filled 2017-07-22: qty 3

## 2017-07-22 MED ORDER — ALBUTEROL SULFATE (2.5 MG/3ML) 0.083% IN NEBU: 2.5000 mg | INHALATION_SOLUTION | Freq: Once | RESPIRATORY_TRACT | Status: DC

## 2017-07-22 MED ORDER — ALBUTEROL SULFATE HFA 108 (90 BASE) MCG/ACT IN AERS
2.0000 | INHALATION_SPRAY | Freq: Four times a day (QID) | RESPIRATORY_TRACT | Status: DC | PRN
  Administered 2017-07-22: 2 via RESPIRATORY_TRACT
  Filled 2017-07-22 (×2): qty 6.7

## 2017-07-22 MED ORDER — PREDNISONE 10 MG PO TABS
60.0000 mg | ORAL_TABLET | Freq: Once | ORAL | Status: AC
  Administered 2017-07-22: 01:00:00 60 mg via ORAL
  Filled 2017-07-22: qty 1

## 2017-07-22 MED ORDER — PREDNISONE 20 MG PO TABS: ORAL_TABLET | ORAL | 0 refills | Status: DC

## 2017-07-22 MED ORDER — ALBUTEROL SULFATE (2.5 MG/3ML) 0.083% IN NEBU: 5.0000 mg | INHALATION_SOLUTION | Freq: Once | RESPIRATORY_TRACT | Status: DC

## 2017-07-22 MED ORDER — IPRATROPIUM BROMIDE 0.02 % IN SOLN: 0.5000 mg | Freq: Once | RESPIRATORY_TRACT | Status: DC

## 2017-07-22 MED ORDER — ALBUTEROL SULFATE HFA 108 (90 BASE) MCG/ACT IN AERS: 2.0000 | INHALATION_SPRAY | RESPIRATORY_TRACT | 0 refills | Status: DC | PRN

## 2017-07-22 NOTE — Discharge Instructions (Addendum)
Use the inhaler and the nebulizer for wheezing as needed. Take the prednisone until gone. You need to get a primary care doctor to help you manage your asthma. Recheck as needed.

## 2017-07-22 NOTE — ED Triage Notes (Signed)
Pt states she used the last of her nebulizer meds this am, states she did not know she had refills available.  Pt reports some chest tightness earlier, but none now.

## 2017-07-22 NOTE — ED Provider Notes (Signed)
[facility] EMERGENCY DEPARTMENT Provider Note   CSN: [id number] Arrival date & time: 07/22/17  0037  Time seen 01:15 AM   History   Chief Complaint Chief Complaint  Patient presents with  . Shortness of Breath    HPI Linda Ellis is a 41 y.o. female.  HPI the patient reports a history of reactive airway disease and COPD.  She states she uses an inhaler and nebulizer on however she does not have a primary care doctor.  She states she gets her prescriptions from the ED.  She states she ran out of her medications in the morning on December 19.  She started having difficulty breathing later in the afternoon and states her chest feels tight and she has been wheezing.  She denies fever, sore throat, rhinorrhea, or coughing.  She states she feels if she had had her medication she would have been able to control this attack on her own.  PCP none  Past Medical History:  Diagnosis Date  . Asthma   . COPD (chronic obstructive pulmonary disease) ([place])   . Multiple gastric ulcers     Patient Active Problem List   Diagnosis Date Noted  . CAP (community acquired pneumonia) 06/24/2017  . Asthma, chronic, unspecified asthma severity, with acute exacerbation 06/24/2017  . Acute asthma exacerbation 06/24/2017    Past Surgical History:  Procedure Laterality Date  . CHOLECYSTECTOMY    . DIAGNOSTIC LAPAROSCOPY WITH REMOVAL OF ECTOPIC PREGNANCY Right 01/24/2017   Procedure: REMOVAL OF ECTOPIC PREGNANCY;  Surgeon: Florian Buff, MD;  Location: [facility];  Service: Gynecology;  Laterality: Right;  . LAPAROSCOPIC UNILATERAL SALPINGECTOMY Right 01/24/2017   Procedure: LAPAROSCOPIC UNILATERAL SALPINGECTOMY;  Surgeon: Florian Buff, MD;  Location: [facility];  Service: Gynecology;  Laterality: Right;    OB History    Gravida Para Term Preterm AB Living   5 4 4     4    SAB TAB Ectopic Multiple Live Births                   Home Medications    Prior to Admission medications   Medication Sig  Start Date End Date Taking? Authorizing Provider  acetaminophen (TYLENOL) 500 MG tablet Take 1,000 mg by mouth every 6 (six) hours as needed. Pain.   Yes [provider]  ranitidine (ZANTAC) 150 MG tablet Take 150-300 mg by mouth daily as needed for heartburn.    Yes [provider]  albuterol (PROVENTIL HFA;VENTOLIN HFA) 108 (90 Base) MCG/ACT inhaler Inhale 2 puffs into the lungs every 4 (four) hours as needed. 07/22/17   Rolland Porter, MD  azithromycin (ZITHROMAX Z-PAK) 250 MG tablet Take 1 tablet (250 mg total) by mouth daily. Patient not taking: Reported on 07/03/2017 06/24/17   Pattricia Boss, MD  predniSONE (DELTASONE) 20 MG tablet Take 3 po QD x 3d , then 2 po QD x 3d then 1 po QD x 3d 07/22/17   Rolland Porter, MD    Family History Family History  Problem Relation Age of Onset  . Diabetes Maternal Grandfather   . Heart attack Maternal Grandfather     Social History Social History   Tobacco Use  . Smoking status: Former Smoker    Packs/day: 0.50    Years: 22.00    Pack years: 11.00    Types: Cigarettes    Last attempt to quit: 03/21/2017    Years since quitting: 0.3  . Smokeless tobacco: Never Used  Substance Use Topics  .  Alcohol use: Yes    Comment: occasionally  . Drug use: No  unemployed   Allergies   Patient has no known allergies.   Review of Systems Review of Systems  All other systems reviewed and are negative.    Physical Exam Updated Vital Signs BP 119/85   Pulse 91   Temp 98.1 F (36.7 C) (Oral)   Resp 15   Ht 5\' 5"  (1.651 m)   Wt 117.9 kg (260 lb)   LMP 07/06/2017   SpO2 94%   BMI 43.27 kg/m   Vital signs normal    Physical Exam  Constitutional: She is oriented to person, place, and time. She appears well-developed and well-nourished.  Non-toxic appearance. She does not appear ill. No distress.  obese  HENT:  Head: Normocephalic and atraumatic.  Right Ear: External ear normal.  Left Ear: External ear normal.  Nose: Nose  normal. No mucosal edema or rhinorrhea.  Mouth/Throat: Oropharynx is clear and moist and mucous membranes are normal. No dental abscesses or uvula swelling.  Eyes: Conjunctivae and EOM are normal. Pupils are equal, round, and reactive to light.  Neck: Normal range of motion and full passive range of motion without pain. Neck supple.  Cardiovascular: Normal rate, regular rhythm and normal heart sounds. Exam reveals no gallop and no friction rub.  No murmur heard. Pulmonary/Chest: Effort normal. Tachypnea noted. No respiratory distress. She has wheezes. She has no rhonchi. She has no rales. She exhibits no tenderness and no crepitus.  Patient was examined at the end of her first nebulizer treatment.  She has fairly good air movement and scattered wheezing diffusely.  Abdominal: Soft. Normal appearance and bowel sounds are normal. She exhibits no distension. There is no tenderness. There is no rebound and no guarding.  Musculoskeletal: Normal range of motion. She exhibits no edema or tenderness.  Moves all extremities well.   Neurological: She is alert and oriented to person, place, and time. She has normal strength. No cranial nerve deficit.  Skin: Skin is warm, dry and intact. No rash noted. No erythema. No pallor.  Psychiatric: She has a normal mood and affect. Her speech is normal and behavior is normal. Her mood appears not anxious.  Nursing note and vitals reviewed.    ED Treatments / Results  Labs (all labs ordered are listed, but only abnormal results are displayed) Labs Reviewed - No data to display  EKG  EKG Interpretation None     ED ECG REPORT   Date: 07/22/2017  Rate: 84  Rhythm: normal sinus rhythm  QRS Axis: right  Intervals: normal  ST/T Wave abnormalities: normal  Conduction Disutrbances:none  Narrative Interpretation: low voltage chest leads  Old EKG Reviewed: none available  I have personally reviewed the EKG tracing and agree with the computerized printout as  noted.   Radiology No results found.  Procedures Procedures (including critical care time)  Medications Ordered in ED Medications  ipratropium-albuterol (DUONEB) 0.5-2.5 (3) MG/3ML nebulizer solution 3 mL (not administered)  albuterol (PROVENTIL) (2.5 MG/3ML) 0.083% nebulizer solution 2.5 mg (not administered)  ipratropium-albuterol (DUONEB) 0.5-2.5 (3) MG/3ML nebulizer solution 3 mL (3 mLs Nebulization Given 07/22/17 0102)  albuterol (PROVENTIL) (2.5 MG/3ML) 0.083% nebulizer solution 2.5 mg (2.5 mg Nebulization Given 07/22/17 0102)  predniSONE (DELTASONE) tablet 60 mg (60 mg Oral Given 07/22/17 0128)  ipratropium-albuterol (DUONEB) 0.5-2.5 (3) MG/3ML nebulizer solution 3 mL (3 mLs Nebulization Given 07/22/17 0141)  albuterol (PROVENTIL) (2.5 MG/3ML) 0.083% nebulizer solution 2.5 mg (2.5 mg Nebulization Given  07/22/17 0141)     Initial Impression / Assessment and Plan / ED Course  I have reviewed the triage vital signs and the nursing notes.  Pertinent labs & imaging results that were available during my care of the patient were reviewed by me and considered in my medical decision making (see chart for details).     Patient was given a second nebulizer with albuterol plus Atrovent.  She was started on oral prednisone.  Recheck at 2 AM patient states she is feeling better.  She has a few high-pitched wheezes in the upper lobes and a few rhonchi in the lower lobe and she was given a third nebulizer and then she will be discharged home.  She states the prednisone always helps her get better quicker. She has multiple refills of her albuterol for her nebulizer that she can pick up in the morning  Final Clinical Impressions(s) / ED Diagnoses   Final diagnoses:  Moderate persistent asthma with exacerbation    ED Discharge Orders        Ordered    predniSONE (DELTASONE) 20 MG tablet     07/22/17 0233    albuterol (PROVENTIL HFA;VENTOLIN HFA) 108 (90 Base) MCG/ACT inhaler  Every 4  hours PRN     07/22/17 0233      Plan discharge  Rolland Porter, MD, Barbette Or, MD 07/22/17 [phone]

## 2017-08-26 ENCOUNTER — Encounter (HOSPITAL_COMMUNITY): Payer: Self-pay

## 2017-08-26 ENCOUNTER — Emergency Department (HOSPITAL_COMMUNITY)
Admission: EM | Admit: 2017-08-26 | Discharge: 2017-08-26 | Disposition: A | Discharge status: Home / Self Care | Payer: Self-pay | Attending: Emergency Medicine | Admitting: Emergency Medicine

## 2017-08-26 ENCOUNTER — Emergency Department (HOSPITAL_COMMUNITY): Payer: Self-pay

## 2017-08-26 ENCOUNTER — Other Ambulatory Visit: Payer: Self-pay

## 2017-08-26 DIAGNOSIS — Z87891 Personal history of nicotine dependence: Secondary | ICD-10-CM | POA: Insufficient documentation

## 2017-08-26 DIAGNOSIS — Z79899 Other long term (current) drug therapy: Secondary | ICD-10-CM | POA: Insufficient documentation

## 2017-08-26 DIAGNOSIS — J4541 Moderate persistent asthma with (acute) exacerbation: Secondary | ICD-10-CM | POA: Insufficient documentation

## 2017-08-26 DIAGNOSIS — J209 Acute bronchitis, unspecified: Secondary | ICD-10-CM | POA: Insufficient documentation

## 2017-08-26 DIAGNOSIS — J449 Chronic obstructive pulmonary disease, unspecified: Secondary | ICD-10-CM | POA: Insufficient documentation

## 2017-08-26 MED ORDER — PREDNISONE 20 MG PO TABS
20.0000 mg | ORAL_TABLET | Freq: Once | ORAL | Status: AC
  Administered 2017-08-26: 20 mg via ORAL
  Filled 2017-08-26: qty 1

## 2017-08-26 MED ORDER — ONDANSETRON HCL 4 MG PO TABS
4.0000 mg | ORAL_TABLET | Freq: Once | ORAL | Status: AC
  Administered 2017-08-26: 4 mg via ORAL
  Filled 2017-08-26: qty 1

## 2017-08-26 MED ORDER — IPRATROPIUM BROMIDE 0.02 % IN SOLN
1.0000 mg | Freq: Once | RESPIRATORY_TRACT | Status: AC
Start: 2017-08-26 — End: 2017-08-26
  Administered 2017-08-26: 1 mg via RESPIRATORY_TRACT

## 2017-08-26 MED ORDER — IPRATROPIUM BROMIDE 0.02 % IN SOLN
RESPIRATORY_TRACT | Status: AC
  Filled 2017-08-26: qty 2.5

## 2017-08-26 MED ORDER — AZITHROMYCIN 250 MG PO TABS: ORAL_TABLET | ORAL | 0 refills | Status: DC

## 2017-08-26 MED ORDER — ALBUTEROL (5 MG/ML) CONTINUOUS INHALATION SOLN
INHALATION_SOLUTION | RESPIRATORY_TRACT | Status: AC
  Administered 2017-08-26: 10 mg/h via RESPIRATORY_TRACT
  Filled 2017-08-26: qty 20

## 2017-08-26 MED ORDER — AZITHROMYCIN 250 MG PO TABS
500.0000 mg | ORAL_TABLET | Freq: Once | ORAL | Status: AC
  Administered 2017-08-26: 500 mg via ORAL
  Filled 2017-08-26: qty 2

## 2017-08-26 MED ORDER — PREDNISONE 10 MG PO TABS: 20.0000 mg | ORAL_TABLET | Freq: Two times a day (BID) | ORAL | 0 refills | Status: DC

## 2017-08-26 MED ORDER — ALBUTEROL (5 MG/ML) CONTINUOUS INHALATION SOLN
10.0000 mg/h | INHALATION_SOLUTION | RESPIRATORY_TRACT | Status: DC
  Administered 2017-08-26: 10 mg/h via RESPIRATORY_TRACT

## 2017-08-26 NOTE — Discharge Instructions (Signed)
Zithromax and prednisone as prescribed.  Continue albuterol nebulizer treatment every 4 hours as needed.  Return to the emergency department if symptoms significantly worsen or change.

## 2017-08-26 NOTE — ED Provider Notes (Signed)
[facility] EMERGENCY DEPARTMENT Provider Note   CSN: [id number] Arrival date & time: 08/26/17  0125     History   Chief Complaint Chief Complaint  Patient presents with  . Shortness of Breath    HPI Linda Ellis is a 42 y.o. female.  Patient is a 42 year old female with history of COPD/asthma presenting for evaluation of chest congestion, wheezing, and productive cough for the past several days.  She has had little relief with her home nebulizer.  She denies any fevers or chills.  She denies any chest pain.   The history is provided by the patient.  Shortness of Breath  This is a recurrent problem. The average episode lasts 3 days. The problem occurs continuously.The problem has been gradually worsening. Associated symptoms include cough, sputum production and wheezing. Pertinent negatives include no fever. Treatments tried: Albuterol nebulizer. The treatment provided mild relief.    Past Medical History:  Diagnosis Date  . Asthma   . COPD (chronic obstructive pulmonary disease) ([place])   . Multiple gastric ulcers     Patient Active Problem List   Diagnosis Date Noted  . CAP (community acquired pneumonia) 06/24/2017  . Asthma, chronic, unspecified asthma severity, with acute exacerbation 06/24/2017  . Acute asthma exacerbation 06/24/2017    Past Surgical History:  Procedure Laterality Date  . CHOLECYSTECTOMY    . DIAGNOSTIC LAPAROSCOPY WITH REMOVAL OF ECTOPIC PREGNANCY Right 01/24/2017   Procedure: REMOVAL OF ECTOPIC PREGNANCY;  Surgeon: Florian Buff, MD;  Location: [facility];  Service: Gynecology;  Laterality: Right;  . LAPAROSCOPIC UNILATERAL SALPINGECTOMY Right 01/24/2017   Procedure: LAPAROSCOPIC UNILATERAL SALPINGECTOMY;  Surgeon: Florian Buff, MD;  Location: [facility];  Service: Gynecology;  Laterality: Right;    OB History    Gravida Para Term Preterm AB Living   5 4 4     4    SAB TAB Ectopic Multiple Live Births                   Home Medications     Prior to Admission medications   Medication Sig Start Date End Date Taking? Authorizing Provider  acetaminophen (TYLENOL) 500 MG tablet Take 1,000 mg by mouth every 6 (six) hours as needed. Pain.    [provider]  albuterol (PROVENTIL HFA;VENTOLIN HFA) 108 (90 Base) MCG/ACT inhaler Inhale 2 puffs into the lungs every 4 (four) hours as needed. 07/22/17   Rolland Porter, MD  azithromycin (ZITHROMAX Z-PAK) 250 MG tablet Take 1 tablet (250 mg total) by mouth daily. Patient not taking: Reported on 07/03/2017 06/24/17   Pattricia Boss, MD  predniSONE (DELTASONE) 20 MG tablet Take 3 po QD x 3d , then 2 po QD x 3d then 1 po QD x 3d 07/22/17   Rolland Porter, MD  ranitidine (ZANTAC) 150 MG tablet Take 150-300 mg by mouth daily as needed for heartburn.     [provider]    Family History Family History  Problem Relation Age of Onset  . Diabetes Maternal Grandfather   . Heart attack Maternal Grandfather     Social History Social History   Tobacco Use  . Smoking status: Former Smoker    Packs/day: 0.50    Years: 22.00    Pack years: 11.00    Types: Cigarettes    Last attempt to quit: 03/21/2017    Years since quitting: 0.4  . Smokeless tobacco: Never Used  Substance Use Topics  . Alcohol use: Yes    Comment: occasionally  .  Drug use: No     Allergies   Patient has no known allergies.   Review of Systems Review of Systems  Constitutional: Negative for fever.  Respiratory: Positive for cough, sputum production, shortness of breath and wheezing.   All other systems reviewed and are negative.    Physical Exam Updated Vital Signs BP 107/64 (BP Location: Left Arm)   Pulse (!) 109   Temp 98.8 F (37.1 C) (Oral)   Resp (!) 22   Ht 5\' 5"  (1.651 m)   Wt 136.1 kg (300 lb)   LMP 08/01/2017 (Approximate)   SpO2 95%   BMI 49.92 kg/m   Physical Exam  Constitutional: She is oriented to person, place, and time. She appears well-developed and well-nourished. No  distress.  HENT:  Head: Normocephalic and atraumatic.  Neck: Normal range of motion. Neck supple.  Cardiovascular: Normal rate and regular rhythm. Exam reveals no gallop and no friction rub.  No murmur heard. Pulmonary/Chest: Effort normal. No respiratory distress. She has wheezes.  There are scattered inspiratory and expiratory wheezes audible bilaterally.  Abdominal: Soft. Bowel sounds are normal. She exhibits no distension. There is no tenderness.  Musculoskeletal: Normal range of motion.  Neurological: She is alert and oriented to person, place, and time.  Skin: Skin is warm and dry. She is not diaphoretic.  Nursing note and vitals reviewed.    ED Treatments / Results  Labs (all labs ordered are listed, but only abnormal results are displayed) Labs Reviewed - No data to display  EKG  EKG Interpretation None       Radiology No results found.  Procedures Procedures (including critical care time)  Medications Ordered in ED Medications  albuterol (PROVENTIL,VENTOLIN) solution continuous neb (10 mg/hr Nebulization New Bag/Given 08/26/17 0146)  ipratropium (ATROVENT) 0.02 % nebulizer solution (  Not Given 08/26/17 0149)  ipratropium (ATROVENT) nebulizer solution 1 mg (1 mg Nebulization Given 08/26/17 0146)     Initial Impression / Assessment and Plan / ED Course  I have reviewed the triage vital signs and the nursing notes.  Pertinent labs & imaging results that were available during my care of the patient were reviewed by me and considered in my medical decision making (see chart for details).  Patient improving after an hour-long nebulizer treatment.  She will be discharged with prednisone, Zithromax, and continued use of her nebulizer.  Final Clinical Impressions(s) / ED Diagnoses   Final diagnoses:  None    ED Discharge Orders    None       Veryl Speak, MD 08/26/17 [phone]

## 2017-08-26 NOTE — ED Triage Notes (Signed)
Cough, cold symptoms, congestion, body aches, wheezing x 3 days.

## 2017-09-25 ENCOUNTER — Encounter (HOSPITAL_COMMUNITY): Payer: Self-pay | Admitting: *Deleted

## 2017-09-25 ENCOUNTER — Emergency Department (HOSPITAL_COMMUNITY)
Admission: EM | Admit: 2017-09-25 | Discharge: 2017-09-25 | Disposition: A | Discharge status: Home / Self Care | Payer: Self-pay | Attending: Emergency Medicine | Admitting: Emergency Medicine

## 2017-09-25 ENCOUNTER — Emergency Department (HOSPITAL_COMMUNITY): Payer: Self-pay

## 2017-09-25 DIAGNOSIS — J45901 Unspecified asthma with (acute) exacerbation: Secondary | ICD-10-CM | POA: Insufficient documentation

## 2017-09-25 DIAGNOSIS — Z87891 Personal history of nicotine dependence: Secondary | ICD-10-CM | POA: Insufficient documentation

## 2017-09-25 DIAGNOSIS — Z79899 Other long term (current) drug therapy: Secondary | ICD-10-CM | POA: Insufficient documentation

## 2017-09-25 DIAGNOSIS — J449 Chronic obstructive pulmonary disease, unspecified: Secondary | ICD-10-CM | POA: Insufficient documentation

## 2017-09-25 MED ORDER — ALBUTEROL SULFATE (2.5 MG/3ML) 0.083% IN NEBU
INHALATION_SOLUTION | RESPIRATORY_TRACT | Status: AC
  Filled 2017-09-25: qty 6

## 2017-09-25 MED ORDER — PREDNISONE 10 MG PO TABS
60.0000 mg | ORAL_TABLET | Freq: Once | ORAL | Status: AC
  Administered 2017-09-25: 16:00:00 60 mg via ORAL
  Filled 2017-09-25: qty 1

## 2017-09-25 MED ORDER — ALBUTEROL SULFATE HFA 108 (90 BASE) MCG/ACT IN AERS
2.0000 | INHALATION_SPRAY | RESPIRATORY_TRACT | Status: DC | PRN
  Administered 2017-09-25: 2 via RESPIRATORY_TRACT

## 2017-09-25 MED ORDER — ALBUTEROL SULFATE (2.5 MG/3ML) 0.083% IN NEBU: 5.0000 mg | INHALATION_SOLUTION | Freq: Once | RESPIRATORY_TRACT | Status: DC

## 2017-09-25 MED ORDER — ALBUTEROL SULFATE (2.5 MG/3ML) 0.083% IN NEBU
5.0000 mg | INHALATION_SOLUTION | Freq: Once | RESPIRATORY_TRACT | Status: AC
Start: 2017-09-25 — End: 2017-09-25
  Administered 2017-09-25: 5 mg via RESPIRATORY_TRACT

## 2017-09-25 MED ORDER — ALBUTEROL SULFATE HFA 108 (90 BASE) MCG/ACT IN AERS
INHALATION_SPRAY | RESPIRATORY_TRACT | Status: AC
  Filled 2017-09-25: qty 6.7

## 2017-09-25 MED ORDER — ALBUTEROL SULFATE (2.5 MG/3ML) 0.083% IN NEBU
INHALATION_SOLUTION | RESPIRATORY_TRACT | Status: AC
  Administered 2017-09-25: 5 mg
  Filled 2017-09-25: qty 6

## 2017-09-25 MED ORDER — PREDNISONE 20 MG PO TABS: ORAL_TABLET | ORAL | 0 refills | Status: DC

## 2017-09-25 NOTE — ED Provider Notes (Signed)
[facility] EMERGENCY DEPARTMENT Provider Note   CSN: [id number] Arrival date & time: 09/25/17  1438     History   Chief Complaint Chief Complaint  Patient presents with  . Shortness of Breath    HPI Linda Ellis is a 42 y.o. female complains of wheezing and shortness of breath typical of asthma started yesterday.  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  She treated self with her albuterol home nebulizer with partial relief.  Denies fever admits to minimal nonproductive cough.  No other associated symptoms.  Nothing makes symptoms better or worse.  HPI  Past Medical History:  Diagnosis Date  . Asthma   . COPD (chronic obstructive pulmonary disease) ([place])   . Multiple gastric ulcers     Patient Active Problem List   Diagnosis Date Noted  . CAP (community acquired pneumonia) 06/24/2017  . Asthma, chronic, unspecified asthma severity, with acute exacerbation 06/24/2017  . Acute asthma exacerbation 06/24/2017    Past Surgical History:  Procedure Laterality Date  . CHOLECYSTECTOMY    . DIAGNOSTIC LAPAROSCOPY WITH REMOVAL OF ECTOPIC PREGNANCY Right 01/24/2017   Procedure: REMOVAL OF ECTOPIC PREGNANCY;  Surgeon: Florian Buff, MD;  Location: [facility];  Service: Gynecology;  Laterality: Right;  . LAPAROSCOPIC UNILATERAL SALPINGECTOMY Right 01/24/2017   Procedure: LAPAROSCOPIC UNILATERAL SALPINGECTOMY;  Surgeon: Florian Buff, MD;  Location: [facility];  Service: Gynecology;  Laterality: Right;    OB History    Gravida Para Term Preterm AB Living   5 4 4     4    SAB TAB Ectopic Multiple Live Births                   Home Medications    Prior to Admission medications   Medication Sig Start Date End Date Taking? Authorizing Provider  acetaminophen (TYLENOL) 500 MG tablet Take 1,000 mg by mouth every 6 (six) hours as needed. Pain.    [provider]  albuterol (PROVENTIL HFA;VENTOLIN HFA) 108 (90 Base) MCG/ACT inhaler Inhale 2  puffs into the lungs every 4 (four) hours as needed. 07/22/17   Rolland Porter, MD  azithromycin (ZITHROMAX Z-PAK) 250 MG tablet 2 po day one, then 1 daily x 4 days 08/26/17   Veryl Speak, MD  predniSONE (DELTASONE) 10 MG tablet Take 2 tablets (20 mg total) by mouth 2 (two) times daily. 08/26/17   Veryl Speak, MD  ranitidine (ZANTAC) 150 MG tablet Take 150-300 mg by mouth daily as needed for heartburn.     [provider]  ALBUTEROL IN Inhale 2 puffs into the lungs as needed. For shortness of breath   10/24/11  [provider]    Family History Family History  Problem Relation Age of Onset  . Diabetes Maternal Grandfather   . Heart attack Maternal Grandfather     Social History Social History   Tobacco Use  . Smoking status: Former Smoker    Packs/day: 0.50    Years: 22.00    Pack years: 11.00    Types: Cigarettes    Last attempt to quit: 03/21/2017    Years since quitting: 0.5  . Smokeless tobacco: Never Used  Substance Use Topics  . Alcohol use: Yes    Comment: occasionally  . Drug use: No     Allergies   Patient has no known allergies.   Review of Systems Review of Systems  Constitutional: Negative.   HENT: Negative.   Respiratory: Positive  for shortness of breath and wheezing.   Cardiovascular: Negative.   Gastrointestinal: Negative.   Musculoskeletal: Negative.   Skin: Negative.   Neurological: Negative.   Psychiatric/Behavioral: Negative.   All other systems reviewed and are negative.    Physical Exam Updated Vital Signs BP 121/75 (BP Location: Right Arm)   Pulse 99   Temp 98.1 F (36.7 C) (Oral)   Resp (!) 24   Ht 5\' 4"  (1.626 m)   Wt 136.1 kg (300 lb)   LMP 09/21/2017   SpO2 99%   BMI 51.49 kg/m   Physical Exam  Constitutional: She appears well-developed and well-nourished.  HENT:  Head: Normocephalic and atraumatic.  Eyes: Conjunctivae are normal. Pupils are equal, round, and reactive to light.  Neck: Neck supple. No  tracheal deviation present. No thyromegaly present.  Cardiovascular: Normal rate and regular rhythm.  No murmur heard. Pulmonary/Chest: Effort normal. No respiratory distress. She has wheezes.  prolonged expiratory phase with expiratory wheeze  Abdominal: Soft. Bowel sounds are normal. She exhibits no distension. There is no tenderness.  Obese  Musculoskeletal: Normal range of motion. She exhibits no edema or tenderness.  Neurological: She is alert. Coordination normal.  Skin: Skin is warm and dry. No rash noted.  Psychiatric: She has a normal mood and affect.  Nursing note and vitals reviewed.    ED Treatments / Results  Labs (all labs ordered are listed, but only abnormal results are displayed) Labs Reviewed - No data to display  EKG  EKG Interpretation None       Radiology Dg Chest 2 View  Result Date: 09/25/2017 CLINICAL DATA:  SOB, Pt with sob since yesterday morning, pt with hx of asthma. Pt denies cough. HISTORY OF ASTHMA, COPD EXAM: CHEST  2 VIEW COMPARISON:  08/26/2017 FINDINGS: The heart size and mediastinal contours are within normal limits. Both lungs are clear. No pleural effusion or pneumothorax. The visualized skeletal structures are unremarkable. IMPRESSION: No active cardiopulmonary disease. Electronically Signed   By: Lajean Manes M.D.   On: 09/25/2017 15:15    Procedures Procedures (including critical care time)  Medications Ordered in ED Medications - No data to display  Results for orders placed or performed during the hospital encounter of 07/03/17  CBC with Differential  Result Value Ref Range   WBC 10.5 4.0 - 10.5 K/uL   RBC 4.73 3.87 - 5.11 MIL/uL   Hemoglobin 14.3 12.0 - 15.0 g/dL   HCT 44.3 36.0 - 46.0 %   MCV 93.7 78.0 - 100.0 fL   MCH 30.2 26.0 - 34.0 pg   MCHC 32.3 30.0 - 36.0 g/dL   RDW 13.3 11.5 - 15.5 %   Platelets 389 150 - 400 K/uL   Neutrophils Relative % 53 %   Neutro Abs 5.6 1.7 - 7.7 K/uL   Lymphocytes Relative 38 %    Lymphs Abs 4.0 0.7 - 4.0 K/uL   Monocytes Relative 6 %   Monocytes Absolute 0.6 0.1 - 1.0 K/uL   Eosinophils Relative 2 %   Eosinophils Absolute 0.3 0.0 - 0.7 K/uL   Basophils Relative 1 %   Basophils Absolute 0.1 0.0 - 0.1 K/uL  Troponin I  Result Value Ref Range   Troponin I <0.03 <0.03 ng/mL  Basic metabolic panel  Result Value Ref Range   Sodium 136 135 - 145 mmol/L   Potassium 3.9 3.5 - 5.1 mmol/L   Chloride 105 101 - 111 mmol/L   CO2 22 22 - 32 mmol/L  Glucose, Bld 133 (H) 65 - 99 mg/dL   BUN 9 6 - 20 mg/dL   Creatinine, Ser 0.70 0.44 - 1.00 mg/dL   Calcium 9.0 8.9 - 10.3 mg/dL   GFR calc non Af Amer >60 >60 mL/min   GFR calc Af Amer >60 >60 mL/min   Anion gap 9 5 - 15   Dg Chest 2 View  Result Date: 09/25/2017 CLINICAL DATA:  SOB, Pt with sob since yesterday morning, pt with hx of asthma. Pt denies cough. HISTORY OF ASTHMA, COPD EXAM: CHEST  2 VIEW COMPARISON:  08/26/2017 FINDINGS: The heart size and mediastinal contours are within normal limits. Both lungs are clear. No pleural effusion or pneumothorax. The visualized skeletal structures are unremarkable. IMPRESSION: No active cardiopulmonary disease. Electronically Signed   By: Lajean Manes M.D.   On: 09/25/2017 15:15   Initial Impression / Assessment and Plan / ED Course  I have reviewed the triage vital signs and the nursing notes.  Pertinent labs & imaging results that were available during my care of the patient were reviewed by me and considered in my medical decision making (see chart for details).      chest  xrayviewed by me.  No signs of infection 4:50 PM patient states she is breathing normally after 2 nebulized treatments and prednisone orally.  She is able to walk up and down the hallway without difficulty.  Respiratory rate 20, pulse oximetry on room air 97%, normal.  She feels ready to go home.  Plan prescription prednisone.  She is told to take Zantac which she has at home together with prednisone as she  has remote history of gastric ulcers.  She will get albuterol HFA to go to take 2 puffs every 4 hours or use albuterol nebulizer every 4 hours as needed for shortness of breath.  Return if needed more than every 4 hours referral primary care Final Clinical Impressions(s) / ED Diagnoses  Diagnosis asthma exacerbation Final diagnoses:  None    ED Discharge Orders    None       Orlie Dakin, MD 09/25/17 1654

## 2017-09-25 NOTE — ED Notes (Signed)
Dr Lenna Sciara has seen

## 2017-09-25 NOTE — ED Notes (Signed)
Pt with complaint of shortness of breath unrelieved by her home  Neb  She is out of her inhaler  PT is morbidly obese with normal VS/BS

## 2017-09-25 NOTE — ED Notes (Signed)
Pt walked up to family room and back  Reports that she feels normal  Pulse ox and pulse taken with Dr Lenna Sciara aware

## 2017-09-25 NOTE — ED Triage Notes (Signed)
Pt with sob since yesterday morning, pt with hx of asthma.  Pt denies cough.

## 2017-09-25 NOTE — ED Notes (Signed)
Dr Lenna Sciara over to reassess

## 2017-09-25 NOTE — Discharge Instructions (Signed)
Use your albuterol inhaler with spacer 2 puffs every 4 hours or albuterol nebulizer every 4 hours as needed for shortness of breath.  Return if needed more than every 4 hours.  Call the [facility] or the number on these instructions in 2 days to establish care with a primary care physician

## 2017-10-12 ENCOUNTER — Emergency Department (HOSPITAL_COMMUNITY): Payer: Self-pay

## 2017-10-12 ENCOUNTER — Other Ambulatory Visit: Payer: Self-pay

## 2017-10-12 ENCOUNTER — Emergency Department (HOSPITAL_COMMUNITY)
Admission: EM | Admit: 2017-10-12 | Discharge: 2017-10-12 | Disposition: A | Discharge status: Home / Self Care | Payer: Self-pay | Attending: Emergency Medicine | Admitting: Emergency Medicine

## 2017-10-12 ENCOUNTER — Encounter (HOSPITAL_COMMUNITY): Payer: Self-pay | Admitting: *Deleted

## 2017-10-12 DIAGNOSIS — Z79899 Other long term (current) drug therapy: Secondary | ICD-10-CM | POA: Insufficient documentation

## 2017-10-12 DIAGNOSIS — Z87891 Personal history of nicotine dependence: Secondary | ICD-10-CM | POA: Insufficient documentation

## 2017-10-12 DIAGNOSIS — J449 Chronic obstructive pulmonary disease, unspecified: Secondary | ICD-10-CM | POA: Insufficient documentation

## 2017-10-12 DIAGNOSIS — J4521 Mild intermittent asthma with (acute) exacerbation: Secondary | ICD-10-CM | POA: Insufficient documentation

## 2017-10-12 LAB — CBC WITH DIFFERENTIAL/PLATELET
Basophils Absolute: 0 10*3/uL (ref 0.0–0.1)
Basophils Relative: 0 %
Eosinophils Absolute: 0.3 10*3/uL (ref 0.0–0.7)
Eosinophils Relative: 2 %
HCT: 40.1 % (ref 36.0–46.0)
HEMOGLOBIN: 12.8 g/dL (ref 12.0–15.0)
LYMPHS ABS: 2.2 10*3/uL (ref 0.7–4.0)
LYMPHS PCT: 19 %
MCH: 29.8 pg (ref 26.0–34.0)
MCHC: 31.9 g/dL (ref 30.0–36.0)
MCV: 93.3 fL (ref 78.0–100.0)
Monocytes Absolute: 0.6 10*3/uL (ref 0.1–1.0)
Monocytes Relative: 5 %
NEUTROS ABS: 8.7 10*3/uL — AB (ref 1.7–7.7)
NEUTROS PCT: 74 %
Platelets: 362 10*3/uL (ref 150–400)
RBC: 4.3 MIL/uL (ref 3.87–5.11)
RDW: 13.4 % (ref 11.5–15.5)
WBC: 11.9 10*3/uL — AB (ref 4.0–10.5)

## 2017-10-12 LAB — COMPREHENSIVE METABOLIC PANEL
ALK PHOS: 98 U/L (ref 38–126)
ALT: 23 U/L (ref 14–54)
AST: 19 U/L (ref 15–41)
Albumin: 3.5 g/dL (ref 3.5–5.0)
Anion gap: 12 (ref 5–15)
BUN: 8 mg/dL (ref 6–20)
CALCIUM: 9.3 mg/dL (ref 8.9–10.3)
CO2: 25 mmol/L (ref 22–32)
CREATININE: 0.76 mg/dL (ref 0.44–1.00)
Chloride: 101 mmol/L (ref 101–111)
GFR calc non Af Amer: >60 mL/min (ref >60)
Glucose, Bld: 124 mg/dL — ABNORMAL HIGH (ref 65–99)
Potassium: 4.4 mmol/L (ref 3.5–5.1)
SODIUM: 138 mmol/L (ref 135–145)
Total Bilirubin: 0.4 mg/dL (ref 0.3–1.2)
Total Protein: 7 g/dL (ref 6.5–8.1)

## 2017-10-12 MED ORDER — ALBUTEROL SULFATE (2.5 MG/3ML) 0.083% IN NEBU
2.5000 mg | INHALATION_SOLUTION | Freq: Once | RESPIRATORY_TRACT | Status: AC
  Administered 2017-10-12: 2.5 mg via RESPIRATORY_TRACT
  Filled 2017-10-12: qty 3

## 2017-10-12 MED ORDER — METHYLPREDNISOLONE SODIUM SUCC 125 MG IJ SOLR
125.0000 mg | Freq: Once | INTRAMUSCULAR | Status: AC
  Administered 2017-10-12: 125 mg via INTRAVENOUS
  Filled 2017-10-12: qty 2

## 2017-10-12 MED ORDER — DOXYCYCLINE HYCLATE 100 MG PO CAPS: 100.0000 mg | ORAL_CAPSULE | Freq: Two times a day (BID) | ORAL | 0 refills | Status: DC

## 2017-10-12 MED ORDER — ALBUTEROL SULFATE (2.5 MG/3ML) 0.083% IN NEBU
2.5000 mg | INHALATION_SOLUTION | Freq: Once | RESPIRATORY_TRACT | Status: AC
  Administered 2017-10-12: 2.5 mg via RESPIRATORY_TRACT

## 2017-10-12 MED ORDER — ACETAMINOPHEN 500 MG PO TABS
ORAL_TABLET | ORAL | Status: AC
  Filled 2017-10-12: qty 2

## 2017-10-12 MED ORDER — IPRATROPIUM-ALBUTEROL 0.5-2.5 (3) MG/3ML IN SOLN
RESPIRATORY_TRACT | Status: AC
  Administered 2017-10-12: 3 mL via RESPIRATORY_TRACT
  Filled 2017-10-12: qty 3

## 2017-10-12 MED ORDER — PREDNISONE 10 MG PO TABS: 20.0000 mg | ORAL_TABLET | Freq: Every day | ORAL | 0 refills | Status: DC

## 2017-10-12 MED ORDER — IPRATROPIUM-ALBUTEROL 0.5-2.5 (3) MG/3ML IN SOLN
3.0000 mL | Freq: Once | RESPIRATORY_TRACT | Status: AC
  Administered 2017-10-12: 3 mL via RESPIRATORY_TRACT

## 2017-10-12 MED ORDER — BENZONATATE 100 MG PO CAPS: 100.0000 mg | ORAL_CAPSULE | Freq: Three times a day (TID) | ORAL | 0 refills | Status: DC | PRN

## 2017-10-12 MED ORDER — ALBUTEROL SULFATE (2.5 MG/3ML) 0.083% IN NEBU
5.0000 mg | INHALATION_SOLUTION | Freq: Once | RESPIRATORY_TRACT | Status: DC
  Filled 2017-10-12: qty 6

## 2017-10-12 MED ORDER — IPRATROPIUM-ALBUTEROL 0.5-2.5 (3) MG/3ML IN SOLN
3.0000 mL | Freq: Once | RESPIRATORY_TRACT | Status: AC
  Administered 2017-10-12: 3 mL via RESPIRATORY_TRACT
  Filled 2017-10-12: qty 3

## 2017-10-12 MED ORDER — ACETAMINOPHEN 500 MG PO TABS
1000.0000 mg | ORAL_TABLET | Freq: Once | ORAL | Status: AC
  Administered 2017-10-12: 1000 mg via ORAL

## 2017-10-12 MED ORDER — MAGNESIUM SULFATE 2 GM/50ML IV SOLN
2.0000 g | Freq: Once | INTRAVENOUS | Status: AC
  Administered 2017-10-12: 2 g via INTRAVENOUS
  Filled 2017-10-12: qty 50

## 2017-10-12 NOTE — ED Provider Notes (Signed)
[facility] EMERGENCY DEPARTMENT Provider Note   CSN: [id number] Arrival date & time: 10/12/17  1128     History   Chief Complaint Chief Complaint  Patient presents with  . Shortness of Breath    HPI Linda Ellis is a 42 y.o. female.  Patient complains of shortness of breath and wheezing.  Patient has a history of asthma.  She has been admitted once years ago no other problems.   The history is provided by the patient. No language interpreter was used.  Shortness of Breath  This is a recurrent problem. The problem occurs continuously.The current episode started more than 2 days ago. The problem has not changed since onset.Associated symptoms include wheezing. Pertinent negatives include no fever, no headaches, no cough, no chest pain, no abdominal pain and no rash.    Past Medical History:  Diagnosis Date  . Asthma   . COPD (chronic obstructive pulmonary disease) ([place])   . Multiple gastric ulcers     Patient Active Problem List   Diagnosis Date Noted  . CAP (community acquired pneumonia) 06/24/2017  . Asthma, chronic, unspecified asthma severity, with acute exacerbation 06/24/2017  . Acute asthma exacerbation 06/24/2017    Past Surgical History:  Procedure Laterality Date  . CHOLECYSTECTOMY    . DIAGNOSTIC LAPAROSCOPY WITH REMOVAL OF ECTOPIC PREGNANCY Right 01/24/2017   Procedure: REMOVAL OF ECTOPIC PREGNANCY;  Surgeon: Florian Buff, MD;  Location: [facility];  Service: Gynecology;  Laterality: Right;  . LAPAROSCOPIC UNILATERAL SALPINGECTOMY Right 01/24/2017   Procedure: LAPAROSCOPIC UNILATERAL SALPINGECTOMY;  Surgeon: Florian Buff, MD;  Location: [facility];  Service: Gynecology;  Laterality: Right;    OB History    Gravida Para Term Preterm AB Living   5 4 4     4    SAB TAB Ectopic Multiple Live Births                   Home Medications    Prior to Admission medications   Medication Sig Start Date End Date Taking? Authorizing Provider  acetaminophen  (TYLENOL) 500 MG tablet Take 1,000 mg by mouth every 6 (six) hours as needed. Pain.   Yes [provider]  albuterol (PROVENTIL HFA;VENTOLIN HFA) 108 (90 Base) MCG/ACT inhaler Inhale 2 puffs into the lungs every 4 (four) hours as needed. 07/22/17  Yes Rolland Porter, MD  albuterol (PROVENTIL) (2.5 MG/3ML) 0.083% nebulizer solution Take 2.5 mg by nebulization every 6 (six) hours as needed for wheezing or shortness of breath.   Yes [provider]  ipratropium-albuterol (DUONEB) 0.5-2.5 (3) MG/3ML SOLN Take 3 mLs by nebulization every 2 (two) hours as needed (wheezing).   Yes [provider]  ranitidine (ZANTAC) 150 MG tablet Take 150-300 mg by mouth daily as needed for heartburn.    Yes [provider]  doxycycline (VIBRAMYCIN) 100 MG capsule Take 1 capsule (100 mg total) by mouth 2 (two) times daily. One po bid x 7 days 10/12/17   Milton Ferguson, MD  predniSONE (DELTASONE) 10 MG tablet Take 2 tablets (20 mg total) by mouth daily. 10/12/17   Milton Ferguson, MD  ALBUTEROL IN Inhale 2 puffs into the lungs as needed. For shortness of breath   10/24/11  [provider]    Family History Family History  Problem Relation Age of Onset  . Diabetes Maternal Grandfather   . Heart attack Maternal Grandfather     Social History Social History   Tobacco Use  . Smoking status: Former Smoker  Packs/day: 0.50    Years: 22.00    Pack years: 11.00    Types: Cigarettes    Last attempt to quit: 03/21/2017    Years since quitting: 0.5  . Smokeless tobacco: Never Used  Substance Use Topics  . Alcohol use: Yes    Comment: occasionally  . Drug use: No     Allergies   Patient has no known allergies.   Review of Systems Review of Systems  Constitutional: Negative for appetite change, fatigue and fever.  HENT: Negative for congestion, ear discharge and sinus pressure.   Eyes: Negative for discharge.  Respiratory: Positive for shortness of breath and wheezing.  Negative for cough.   Cardiovascular: Negative for chest pain.  Gastrointestinal: Negative for abdominal pain and diarrhea.  Genitourinary: Negative for frequency and hematuria.  Musculoskeletal: Negative for back pain.  Skin: Negative for rash.  Neurological: Negative for seizures and headaches.  Psychiatric/Behavioral: Negative for hallucinations.     Physical Exam Updated Vital Signs BP 112/65   Pulse 93   Temp 98.5 F (36.9 C) (Oral)   Resp 18   Ht 5\' 5"  (1.651 m)   Wt 136.1 kg (300 lb)   LMP 09/21/2017   SpO2 92%   BMI 49.92 kg/m   Physical Exam  Constitutional: She is oriented to person, place, and time. She appears well-developed.  HENT:  Head: Normocephalic.  Eyes: Conjunctivae and EOM are normal. No scleral icterus.  Neck: Neck supple. No thyromegaly present.  Cardiovascular: Normal rate and regular rhythm. Exam reveals no gallop and no friction rub.  No murmur heard. Pulmonary/Chest: No stridor. She has wheezes. She has no rales. She exhibits no tenderness.  Abdominal: She exhibits no distension. There is no tenderness. There is no rebound.  Musculoskeletal: Normal range of motion. She exhibits no edema.  Lymphadenopathy:    She has no cervical adenopathy.  Neurological: She is oriented to person, place, and time. She exhibits normal muscle tone. Coordination normal.  Skin: No rash noted. No erythema.  Psychiatric: She has a normal mood and affect. Her behavior is normal.     ED Treatments / Results  Labs (all labs ordered are listed, but only abnormal results are displayed) Labs Reviewed  CBC WITH DIFFERENTIAL/PLATELET - Abnormal; Notable for the following components:      Result Value   WBC 11.9 (*)    Neutro Abs 8.7 (*)    All other components within normal limits  COMPREHENSIVE METABOLIC PANEL - Abnormal; Notable for the following components:   Glucose, Bld 124 (*)    All other components within normal limits    EKG  EKG Interpretation None         Radiology Dg Chest 2 View  Result Date: 10/12/2017 CLINICAL DATA:  Cough and congestion. EXAM: CHEST - 2 VIEW COMPARISON:  09/25/2017. FINDINGS: Mediastinum hilar structures are normal. Bibasilar atelectasis/infiltrates. No pleural effusion or pneumothorax. Stable heart size. Normal pulmonary vascularity. No acute bony abnormality. IMPRESSION: Bibasilar atelectasis/infiltrates. Electronically Signed   By: Marcello Moores  Register   On: 10/12/2017 12:55    Procedures Procedures (including critical care time)  Medications Ordered in ED Medications  ipratropium-albuterol (DUONEB) 0.5-2.5 (3) MG/3ML nebulizer solution 3 mL (3 mLs Nebulization Given 10/12/17 1212)  albuterol (PROVENTIL) (2.5 MG/3ML) 0.083% nebulizer solution 2.5 mg (2.5 mg Nebulization Given 10/12/17 1212)  methylPREDNISolone sodium succinate (SOLU-MEDROL) 125 mg/2 mL injection 125 mg (125 mg Intravenous Given 10/12/17 1227)  magnesium sulfate IVPB 2 g 50 mL (0 g Intravenous Stopped  10/12/17 1307)  acetaminophen (TYLENOL) tablet 1,000 mg (1,000 mg Oral Given 10/12/17 1318)  ipratropium-albuterol (DUONEB) 0.5-2.5 (3) MG/3ML nebulizer solution 3 mL (3 mLs Nebulization Given 10/12/17 1511)  albuterol (PROVENTIL) (2.5 MG/3ML) 0.083% nebulizer solution 2.5 mg (2.5 mg Nebulization Given 10/12/17 1510)     Initial Impression / Assessment and Plan / ED Course  I have reviewed the triage vital signs and the nursing notes.  Pertinent labs & imaging results that were available during my care of the patient were reviewed by me and considered in my medical decision making (see chart for details).     Patient with exacerbation of her asthma.  Patient improved with neb treatments.  Patient's O2 sats were good with ambulation.  She will be sent home with prednisone and doxycycline and will continue using her nebs every 4-6 hours.  Patient will return if problems and follow-up with a family doctor  Final Clinical Impressions(s) / ED Diagnoses    Final diagnoses:  Mild intermittent asthma with exacerbation    ED Discharge Orders        Ordered    doxycycline (VIBRAMYCIN) 100 MG capsule  2 times daily     10/12/17 1520    predniSONE (DELTASONE) 10 MG tablet  Daily     10/12/17 1520       Milton Ferguson, MD 10/12/17 1524

## 2017-10-12 NOTE — Discharge Instructions (Signed)
Follow up with your

## 2017-10-12 NOTE — ED Notes (Signed)
Pt states her head is better.  States she is starting to cough again.  No visible respiratory distress.

## 2017-10-12 NOTE — ED Triage Notes (Signed)
Pt c/o SOB and light brown productive cough since Friday. Pt reports she had vomiting and fever on Friday which went away on Sat but the SOB has gotten worse. Pt has been using nebulizer with no relief. Pt also reports mid chest pain that started this morning.

## 2017-10-12 NOTE — ED Notes (Signed)
Pt states her breathing is better ,  c/o headache.  MD notified.  Orders received.

## 2017-10-12 NOTE — ED Notes (Signed)
Pt ambulated without difficulty.  Pulse ox stayed 93%.

## 2017-10-23 ENCOUNTER — Emergency Department (HOSPITAL_COMMUNITY)
Admission: EM | Admit: 2017-10-23 | Discharge: 2017-10-23 | Disposition: A | Discharge status: Home / Self Care | Payer: Self-pay | Attending: Emergency Medicine | Admitting: Emergency Medicine

## 2017-10-23 ENCOUNTER — Emergency Department (HOSPITAL_COMMUNITY): Payer: Self-pay

## 2017-10-23 ENCOUNTER — Other Ambulatory Visit: Payer: Self-pay

## 2017-10-23 ENCOUNTER — Encounter (HOSPITAL_COMMUNITY): Payer: Self-pay | Admitting: Emergency Medicine

## 2017-10-23 DIAGNOSIS — J449 Chronic obstructive pulmonary disease, unspecified: Secondary | ICD-10-CM | POA: Insufficient documentation

## 2017-10-23 DIAGNOSIS — J4521 Mild intermittent asthma with (acute) exacerbation: Secondary | ICD-10-CM

## 2017-10-23 DIAGNOSIS — Z79899 Other long term (current) drug therapy: Secondary | ICD-10-CM | POA: Insufficient documentation

## 2017-10-23 DIAGNOSIS — Z87891 Personal history of nicotine dependence: Secondary | ICD-10-CM | POA: Insufficient documentation

## 2017-10-23 DIAGNOSIS — J45901 Unspecified asthma with (acute) exacerbation: Secondary | ICD-10-CM | POA: Insufficient documentation

## 2017-10-23 LAB — CBC WITH DIFFERENTIAL/PLATELET
Basophils Absolute: 0.1 10*3/uL (ref 0.0–0.1)
Basophils Relative: 0 %
EOS ABS: 0.2 10*3/uL (ref 0.0–0.7)
EOS PCT: 1 %
HCT: 41.4 % (ref 36.0–46.0)
HEMOGLOBIN: 13.6 g/dL (ref 12.0–15.0)
LYMPHS ABS: 4.3 10*3/uL — AB (ref 0.7–4.0)
LYMPHS PCT: 26 %
MCH: 30.4 pg (ref 26.0–34.0)
MCHC: 32.9 g/dL (ref 30.0–36.0)
MCV: 92.6 fL (ref 78.0–100.0)
MONOS PCT: 5 %
Monocytes Absolute: 0.8 10*3/uL (ref 0.1–1.0)
NEUTROS PCT: 68 %
Neutro Abs: 11.4 10*3/uL — ABNORMAL HIGH (ref 1.7–7.7)
Platelets: 410 10*3/uL — ABNORMAL HIGH (ref 150–400)
RBC: 4.47 MIL/uL (ref 3.87–5.11)
RDW: 13.3 % (ref 11.5–15.5)
WBC: 16.8 10*3/uL — ABNORMAL HIGH (ref 4.0–10.5)

## 2017-10-23 LAB — BASIC METABOLIC PANEL
Anion gap: 9 (ref 5–15)
BUN: 10 mg/dL (ref 6–20)
CHLORIDE: 103 mmol/L (ref 101–111)
CO2: 25 mmol/L (ref 22–32)
CREATININE: 0.77 mg/dL (ref 0.44–1.00)
Calcium: 9.1 mg/dL (ref 8.9–10.3)
GFR calc Af Amer: >60 mL/min (ref >60)
GFR calc non Af Amer: >60 mL/min (ref >60)
Glucose, Bld: 103 mg/dL — ABNORMAL HIGH (ref 65–99)
Potassium: 3.9 mmol/L (ref 3.5–5.1)
Sodium: 137 mmol/L (ref 135–145)

## 2017-10-23 MED ORDER — METHYLPREDNISOLONE SODIUM SUCC 125 MG IJ SOLR
125.0000 mg | Freq: Once | INTRAMUSCULAR | Status: AC
  Administered 2017-10-23: 125 mg via INTRAVENOUS
  Filled 2017-10-23: qty 2

## 2017-10-23 MED ORDER — ALBUTEROL (5 MG/ML) CONTINUOUS INHALATION SOLN
10.0000 mg/h | INHALATION_SOLUTION | Freq: Once | RESPIRATORY_TRACT | Status: AC
Start: 2017-10-23 — End: 2017-10-23
  Administered 2017-10-23: 10 mg/h via RESPIRATORY_TRACT
  Filled 2017-10-23: qty 20

## 2017-10-23 MED ORDER — PREDNISONE 20 MG PO TABS: 40.0000 mg | ORAL_TABLET | Freq: Every day | ORAL | 0 refills | Status: DC

## 2017-10-23 MED ORDER — IPRATROPIUM BROMIDE 0.02 % IN SOLN
RESPIRATORY_TRACT | Status: AC
  Filled 2017-10-23: qty 2.5

## 2017-10-23 MED ORDER — IPRATROPIUM BROMIDE 0.02 % IN SOLN
1.0000 mg | Freq: Once | RESPIRATORY_TRACT | Status: AC
  Administered 2017-10-23: 1 mg via RESPIRATORY_TRACT
  Filled 2017-10-23: qty 5

## 2017-10-23 MED ORDER — ALBUTEROL SULFATE HFA 108 (90 BASE) MCG/ACT IN AERS
4.0000 | INHALATION_SPRAY | RESPIRATORY_TRACT | Status: DC
  Filled 2017-10-23: qty 6.7

## 2017-10-23 MED ORDER — ALBUTEROL SULFATE HFA 108 (90 BASE) MCG/ACT IN AERS: 2.0000 | INHALATION_SPRAY | RESPIRATORY_TRACT | 0 refills | Status: DC | PRN

## 2017-10-23 NOTE — ED Provider Notes (Signed)
[facility] EMERGENCY DEPARTMENT Provider Note   CSN: [id number] Arrival date & time: 10/23/17  1443     History   Chief Complaint No chief complaint on file.   HPI Linda Ellis is a 42 y.o. female.  HPI  Pt was seen at 1500.  Per pt, c/o gradual onset and worsening of persistent wheezing and SOB since yesterday. Pt states she was using a cleaning spray in her bathroom yesterday before her symptoms began. Describes her symptoms as "my asthma is acting up."  Has been using home nebs without relief. Denies CP/palpitations, no back pain, no abd pain, no N/V/D, no fevers, no rash. The symptoms have been associated with no other complaints. The patient has a significant history of similar symptoms previously, recently being evaluated for this complaint and multiple prior evals for same. Last ED evaluation was 1.5 weeks ago, she has since run out of her MDI but did complete the course of prednisone.        Past Medical History:  Diagnosis Date  . Asthma   . COPD (chronic obstructive pulmonary disease) ([place])   . Multiple gastric ulcers     Patient Active Problem List   Diagnosis Date Noted  . CAP (community acquired pneumonia) 06/24/2017  . Asthma, chronic, unspecified asthma severity, with acute exacerbation 06/24/2017  . Acute asthma exacerbation 06/24/2017    Past Surgical History:  Procedure Laterality Date  . CHOLECYSTECTOMY    . DIAGNOSTIC LAPAROSCOPY WITH REMOVAL OF ECTOPIC PREGNANCY Right 01/24/2017   Procedure: REMOVAL OF ECTOPIC PREGNANCY;  Surgeon: Florian Buff, MD;  Location: [facility];  Service: Gynecology;  Laterality: Right;  . LAPAROSCOPIC UNILATERAL SALPINGECTOMY Right 01/24/2017   Procedure: LAPAROSCOPIC UNILATERAL SALPINGECTOMY;  Surgeon: Florian Buff, MD;  Location: [facility];  Service: Gynecology;  Laterality: Right;     OB History    Gravida  5   Para  4   Term  4   Preterm      AB      Living  4     SAB      TAB      Ectopic      Multiple        Live Births               Home Medications    Prior to Admission medications   Medication Sig Start Date End Date Taking? Authorizing Provider  acetaminophen (TYLENOL) 500 MG tablet Take 1,000 mg by mouth every 6 (six) hours as needed. Pain.    [provider]  albuterol (PROVENTIL HFA;VENTOLIN HFA) 108 (90 Base) MCG/ACT inhaler Inhale 2 puffs into the lungs every 4 (four) hours as needed. 07/22/17   Rolland Porter, MD  albuterol (PROVENTIL) (2.5 MG/3ML) 0.083% nebulizer solution Take 2.5 mg by nebulization every 6 (six) hours as needed for wheezing or shortness of breath.    [provider]  benzonatate (TESSALON) 100 MG capsule Take 1 capsule (100 mg total) by mouth 3 (three) times daily as needed for cough. 10/12/17   Milton Ferguson, MD  doxycycline (VIBRAMYCIN) 100 MG capsule Take 1 capsule (100 mg total) by mouth 2 (two) times daily. One po bid x 7 days 10/12/17   Milton Ferguson, MD  ipratropium-albuterol (DUONEB) 0.5-2.5 (3) MG/3ML SOLN Take 3 mLs by nebulization every 2 (two) hours as needed (wheezing).    [provider]  predniSONE (DELTASONE) 10 MG tablet Take 2 tablets (20 mg total) by mouth daily. 10/12/17  Milton Ferguson, MD  ranitidine (ZANTAC) 150 MG tablet Take 150-300 mg by mouth daily as needed for heartburn.     [provider]  ALBUTEROL IN Inhale 2 puffs into the lungs as needed. For shortness of breath   10/24/11  [provider]    Family History Family History  Problem Relation Age of Onset  . Diabetes Maternal Grandfather   . Heart attack Maternal Grandfather     Social History Social History   Tobacco Use  . Smoking status: Former Smoker    Packs/day: 0.50    Years: 22.00    Pack years: 11.00    Types: Cigarettes    Last attempt to quit: 03/21/2017    Years since quitting: 0.5  . Smokeless tobacco: Never Used  Substance Use Topics  . Alcohol use: Yes    Comment: occasionally  . Drug use: No      Allergies   Patient has no known allergies.   Review of Systems Review of Systems ROS: Statement: All systems negative except as marked or noted in the HPI; Constitutional: Negative for fever and chills. ; ; Eyes: Negative for eye pain, redness and discharge. ; ; ENMT: Negative for ear pain, hoarseness, nasal congestion, sinus pressure and sore throat. ; ; Cardiovascular: Negative for chest pain, palpitations, diaphoresis, and peripheral edema. ; ; Respiratory: +wheezing, SOB. Negative for cough and stridor. ; ; Gastrointestinal: Negative for nausea, vomiting, diarrhea, abdominal pain, blood in stool, hematemesis, jaundice and rectal bleeding. . ; ; Genitourinary: Negative for dysuria, flank pain and hematuria. ; ; Musculoskeletal: Negative for back pain and neck pain. Negative for swelling and trauma.; ; Skin: Negative for pruritus, rash, abrasions, blisters, bruising and skin lesion.; ; Neuro: Negative for headache, lightheadedness and neck stiffness. Negative for weakness, altered level of consciousness, altered mental status, extremity weakness, paresthesias, involuntary movement, seizure and syncope.      Physical Exam Updated Vital Signs BP 123/84 (BP Location: Right Arm)   Pulse 97   Temp 98 F (36.7 C)   Resp 20   Ht 5\' 5"  (1.651 m)   Wt 135.2 kg (298 lb)   LMP 10/17/2017   SpO2 93%   BMI 49.59 kg/m   Physical Exam 1505: Physical examination:  Nursing notes reviewed; Vital signs and O2 SAT reviewed;  Constitutional: Well developed, Well nourished, Well hydrated, Uncomfortable appearing.;; Head:  Normocephalic, atraumatic; Eyes: EOMI, PERRL, No scleral icterus; ENMT: Mouth and pharynx normal, Mucous membranes moist; Neck: Supple, Full range of motion, No lymphadenopathy; Cardiovascular: Tachycardic rate and rhythm, No gallop; Respiratory: Breath sounds coarse & equal bilaterally, insp/exp wheezes bilat. Faint audible wheezing.  Speaking short sentences, sitting upright,  tachypneic.; Chest: Nontender, Movement normal; Abdomen: Soft, Nontender, Nondistended, Normal bowel sounds; Genitourinary: No CVA tenderness; Extremities: Pulses normal, No tenderness, No edema, No calf edema or asymmetry.; Neuro: AA&Ox3, Major CN grossly intact.  Speech clear. No gross focal motor or sensory deficits in extremities.; Skin: Color normal, Warm, Dry.    ED Treatments / Results  Labs (all labs ordered are listed, but only abnormal results are displayed)   EKG None    Radiology   Procedures Procedures (including critical care time)  Medications Ordered in ED Medications  methylPREDNISolone sodium succinate (SOLU-MEDROL) 125 mg/2 mL injection 125 mg (has no administration in time range)  albuterol (PROVENTIL,VENTOLIN) solution continuous neb (has no administration in time range)  ipratropium (ATROVENT) nebulizer solution 1 mg (has no administration in time range)     Initial  Impression / Assessment and Plan / ED Course  I have reviewed the triage vital signs and the nursing notes.  Pertinent labs & imaging results that were available during my care of the patient were reviewed by me and considered in my medical decision making (see chart for details).  MDM Reviewed: previous chart, nursing note and vitals Reviewed previous: labs Interpretation: labs and x-ray    Results for orders placed or performed during the hospital encounter of 82/99/37  Basic metabolic panel  Result Value Ref Range   Sodium 137 135 - 145 mmol/L   Potassium 3.9 3.5 - 5.1 mmol/L   Chloride 103 101 - 111 mmol/L   CO2 25 22 - 32 mmol/L   Glucose, Bld 103 (H) 65 - 99 mg/dL   BUN 10 6 - 20 mg/dL   Creatinine, Ser 0.77 0.44 - 1.00 mg/dL   Calcium 9.1 8.9 - 10.3 mg/dL   GFR calc non Af Amer >60 >60 mL/min   GFR calc Af Amer >60 >60 mL/min   Anion gap 9 5 - 15  CBC with Differential  Result Value Ref Range   WBC 16.8 (H) 4.0 - 10.5 K/uL   RBC 4.47 3.87 - 5.11 MIL/uL   Hemoglobin  13.6 12.0 - 15.0 g/dL   HCT 41.4 36.0 - 46.0 %   MCV 92.6 78.0 - 100.0 fL   MCH 30.4 26.0 - 34.0 pg   MCHC 32.9 30.0 - 36.0 g/dL   RDW 13.3 11.5 - 15.5 %   Platelets 410 (H) 150 - 400 K/uL   Neutrophils Relative % 68 %   Neutro Abs 11.4 (H) 1.7 - 7.7 K/uL   Lymphocytes Relative 26 %   Lymphs Abs 4.3 (H) 0.7 - 4.0 K/uL   Monocytes Relative 5 %   Monocytes Absolute 0.8 0.1 - 1.0 K/uL   Eosinophils Relative 1 %   Eosinophils Absolute 0.2 0.0 - 0.7 K/uL   Basophils Relative 0 %   Basophils Absolute 0.1 0.0 - 0.1 K/uL   Dg Chest 2 View Result Date: 10/23/2017 CLINICAL DATA:  Wheezing after inhalational injury.  Asthma. EXAM: CHEST - 2 VIEW COMPARISON:  10/12/2017. FINDINGS: Trachea is midline. Heart size stable. Lungs are clear. No pleural fluid. IMPRESSION: No acute findings. Electronically Signed   By: Lorin Picket M.D.   On: 10/23/2017 15:43    1800:  Pt states she "feels better" after hour long neb and IV steroid.  NAD, lungs CTA bilat, no wheezing, resps easy, speaking full sentences, Sats 100% R/A.  Pt ambulated around the ED with Sats remaining 98-100 % R/A, resps easy, NAD. CXR without infiltrate. Remains afebrile. Pt states she wants to go home now. Dx and testing d/w pt.  Questions answered.  Verb understanding, agreeable to d/c home with outpt f/u.     Final Clinical Impressions(s) / ED Diagnoses   Final diagnoses:  None    ED Discharge Orders    None       Francine Graven, DO 10/26/17 1546

## 2017-10-23 NOTE — Discharge Instructions (Signed)
Take the prescriptions as directed.  Use your albuterol inhaler (2 to 4 puffs) or your albuterol nebulizer (1 unit dose) every 4 hours for the next 7 days, then as needed for cough, wheezing, or shortness of breath.  Call your regular medical doctor Monday morning to schedule a follow up appointment within the next 3 days.  Return to the Emergency Department immediately sooner if worsening.

## 2017-10-23 NOTE — ED Triage Notes (Signed)
Patient c/o asthma flare that started this am. Has used neb tx x 2 this am with no relief of symptoms.

## 2017-11-07 ENCOUNTER — Emergency Department (HOSPITAL_COMMUNITY): Payer: Self-pay

## 2017-11-07 ENCOUNTER — Emergency Department (HOSPITAL_COMMUNITY)
Admission: EM | Admit: 2017-11-07 | Discharge: 2017-11-07 | Disposition: A | Discharge status: Home / Self Care | Payer: Self-pay | Attending: Emergency Medicine | Admitting: Emergency Medicine

## 2017-11-07 ENCOUNTER — Encounter (HOSPITAL_COMMUNITY): Payer: Self-pay | Admitting: Emergency Medicine

## 2017-11-07 ENCOUNTER — Other Ambulatory Visit: Payer: Self-pay

## 2017-11-07 DIAGNOSIS — J4541 Moderate persistent asthma with (acute) exacerbation: Secondary | ICD-10-CM | POA: Insufficient documentation

## 2017-11-07 DIAGNOSIS — Z79899 Other long term (current) drug therapy: Secondary | ICD-10-CM | POA: Insufficient documentation

## 2017-11-07 DIAGNOSIS — Z87891 Personal history of nicotine dependence: Secondary | ICD-10-CM | POA: Insufficient documentation

## 2017-11-07 MED ORDER — IPRATROPIUM-ALBUTEROL 0.5-2.5 (3) MG/3ML IN SOLN
3.0000 mL | Freq: Once | RESPIRATORY_TRACT | Status: AC
  Administered 2017-11-07: 3 mL via RESPIRATORY_TRACT
  Filled 2017-11-07: qty 3

## 2017-11-07 MED ORDER — PREDNISONE 20 MG PO TABS: 60.0000 mg | ORAL_TABLET | Freq: Every day | ORAL | 0 refills | Status: DC

## 2017-11-07 MED ORDER — IPRATROPIUM BROMIDE HFA 17 MCG/ACT IN AERS: 2.0000 | INHALATION_SPRAY | Freq: Once | RESPIRATORY_TRACT | Status: DC

## 2017-11-07 MED ORDER — ALBUTEROL SULFATE HFA 108 (90 BASE) MCG/ACT IN AERS
2.0000 | INHALATION_SPRAY | Freq: Once | RESPIRATORY_TRACT | Status: AC
  Administered 2017-11-07: 2 via RESPIRATORY_TRACT
  Filled 2017-11-07: qty 6.7

## 2017-11-07 MED ORDER — IPRATROPIUM BROMIDE 0.02 % IN SOLN
0.5000 mg | Freq: Once | RESPIRATORY_TRACT | Status: DC
  Filled 2017-11-07: qty 2.5

## 2017-11-07 MED ORDER — IPRATROPIUM BROMIDE HFA 17 MCG/ACT IN AERS: 2.0000 | INHALATION_SPRAY | Freq: Four times a day (QID) | RESPIRATORY_TRACT | 12 refills | Status: DC | PRN

## 2017-11-07 MED ORDER — ALBUTEROL SULFATE (2.5 MG/3ML) 0.083% IN NEBU: 2.5000 mg | INHALATION_SOLUTION | Freq: Four times a day (QID) | RESPIRATORY_TRACT | 1 refills | Status: DC | PRN

## 2017-11-07 MED ORDER — PREDNISONE 50 MG PO TABS
60.0000 mg | ORAL_TABLET | Freq: Once | ORAL | Status: AC
  Administered 2017-11-07: 17:00:00 60 mg via ORAL
  Filled 2017-11-07: qty 1

## 2017-11-07 MED ORDER — ALBUTEROL SULFATE (2.5 MG/3ML) 0.083% IN NEBU
5.0000 mg | INHALATION_SOLUTION | Freq: Once | RESPIRATORY_TRACT | Status: AC
  Administered 2017-11-07: 5 mg via RESPIRATORY_TRACT
  Filled 2017-11-07: qty 6

## 2017-11-07 NOTE — ED Provider Notes (Signed)
[facility] EMERGENCY DEPARTMENT Provider Note   CSN: [id number] Arrival date & time: 11/07/17  1559     History   Chief Complaint Chief Complaint  Patient presents with  . Shortness of Breath    HPI Linda Ellis is a 42 y.o. female.  42 year old female former smoker with history of asthma and COPD here with another asthma exacerbation.  Sounds like she was just here a few weeks ago for similar.  At that point she was treated with steroids.  She states that since last night she is increased shortness of breath revealing an elephant sitting on her chest.  This is typical for her asthma exacerbation.  She denies any fever.  There is been a nonproductive cough.  She denies any upper respiratory infection symptoms preceding this. The history is provided by the patient.  Shortness of Breath  This is a recurrent problem. The average episode lasts 1 day. The problem occurs frequently.The current episode started yesterday. The problem has not changed since onset.Associated symptoms include cough and wheezing. Pertinent negatives include no fever, no coryza, no rhinorrhea, no sore throat, no ear pain, no sputum production, no chest pain, no vomiting, no abdominal pain, no rash, no leg pain, no leg swelling and no claudication. It is unknown what precipitated the problem. She has tried nothing for the symptoms. The treatment provided no relief. Associated medical issues include asthma and COPD.    Past Medical History:  Diagnosis Date  . Asthma   . COPD (chronic obstructive pulmonary disease) ([place])   . Multiple gastric ulcers     Patient Active Problem List   Diagnosis Date Noted  . CAP (community acquired pneumonia) 06/24/2017  . Asthma, chronic, unspecified asthma severity, with acute exacerbation 06/24/2017  . Acute asthma exacerbation 06/24/2017    Past Surgical History:  Procedure Laterality Date  . CHOLECYSTECTOMY    . DIAGNOSTIC LAPAROSCOPY WITH REMOVAL OF ECTOPIC PREGNANCY Right  01/24/2017   Procedure: REMOVAL OF ECTOPIC PREGNANCY;  Surgeon: Florian Buff, MD;  Location: [facility];  Service: Gynecology;  Laterality: Right;  . LAPAROSCOPIC UNILATERAL SALPINGECTOMY Right 01/24/2017   Procedure: LAPAROSCOPIC UNILATERAL SALPINGECTOMY;  Surgeon: Florian Buff, MD;  Location: [facility];  Service: Gynecology;  Laterality: Right;     OB History    Gravida  5   Para  4   Term  4   Preterm      AB      Living  4     SAB      TAB      Ectopic      Multiple      Live Births               Home Medications    Prior to Admission medications   Medication Sig Start Date End Date Taking? Authorizing Provider  acetaminophen (TYLENOL) 500 MG tablet Take 1,000 mg by mouth every 6 (six) hours as needed. Pain.    [provider]  albuterol (PROVENTIL HFA;VENTOLIN HFA) 108 (90 Base) MCG/ACT inhaler Inhale 2 puffs into the lungs every 4 (four) hours as needed. 07/22/17   Rolland Porter, MD  albuterol (PROVENTIL HFA;VENTOLIN HFA) 108 (90 Base) MCG/ACT inhaler Inhale 2 puffs into the lungs every 4 (four) hours as needed for wheezing or shortness of breath. 10/23/17   Francine Graven, DO  albuterol (PROVENTIL) (2.5 MG/3ML) 0.083% nebulizer solution Take 2.5 mg by nebulization every 6 (six) hours as needed for wheezing or shortness of breath.  [provider]  DM-Phenylephrine-Acetaminophen (VICKS DAYQUIL MULTI-SYMPTOM) 10-5-325 MG CAPS Take 1-2 capsules by mouth as needed.    [provider]  doxycycline (VIBRAMYCIN) 100 MG capsule Take 1 capsule (100 mg total) by mouth 2 (two) times daily. One po bid x 7 days 10/12/17   Milton Ferguson, MD  methylPREDNISolone (MEDROL DOSEPAK) 4 MG TBPK tablet TK UTD 08/17/17   [provider]  predniSONE (DELTASONE) 20 MG tablet Take 2 tablets (40 mg total) by mouth daily. 10/23/17   Francine Graven, DO  ranitidine (ZANTAC) 150 MG tablet Take 150-300 mg by mouth daily as needed for heartburn.     [provider]  ALBUTEROL IN Inhale 2 puffs into the lungs as needed. For shortness of breath   10/24/11  [provider]    Family History Family History  Problem Relation Age of Onset  . Diabetes Maternal Grandfather   . Heart attack Maternal Grandfather     Social History Social History   Tobacco Use  . Smoking status: Former Smoker    Packs/day: 0.50    Years: 22.00    Pack years: 11.00    Types: Cigarettes    Last attempt to quit: 03/21/2017    Years since quitting: 0.6  . Smokeless tobacco: Never Used  Substance Use Topics  . Alcohol use: Yes    Comment: occasionally  . Drug use: No     Allergies   Patient has no known allergies.   Review of Systems Review of Systems  Constitutional: Negative for chills and fever.  HENT: Negative for ear pain, rhinorrhea and sore throat.   Eyes: Negative for pain and visual disturbance.  Respiratory: Positive for cough, shortness of breath and wheezing. Negative for sputum production.   Cardiovascular: Negative for chest pain, palpitations, claudication and leg swelling.  Gastrointestinal: Negative for abdominal pain and vomiting.  Genitourinary: Negative for dysuria and hematuria.  Musculoskeletal: Negative for arthralgias and back pain.  Skin: Negative for color change and rash.  Neurological: Negative for seizures and syncope.  All other systems reviewed and are negative.    Physical Exam Updated Vital Signs BP 121/76 (BP Location: Right Arm)   Pulse (!) 108   Temp 98.6 F (37 C) (Oral)   Resp (!) 26   Ht 5\' 5"  (1.651 m)   Wt (!) 136.2 kg (300 lb 3 oz)   LMP 10/17/2017   SpO2 94%   BMI 49.95 kg/m   Physical Exam  Constitutional: She appears well-developed and well-nourished.  HENT:  Head: Normocephalic and atraumatic.  Eyes: Conjunctivae are normal.  Neck: Trachea normal and normal range of motion. Neck supple. No tracheal tenderness present.  Cardiovascular: Regular rhythm. Tachycardia present.    No murmur heard. Pulmonary/Chest: Accessory muscle usage present. No respiratory distress. She has wheezes.  Abdominal: Soft. Bowel sounds are normal.  Neurological: She is alert. GCS eye subscore is 4. GCS verbal subscore is 5. GCS motor subscore is 6.  Skin: Skin is warm and dry.  Psychiatric: She has a normal mood and affect.     ED Treatments / Results  Labs (all labs ordered are listed, but only abnormal results are displayed) Labs Reviewed - No data to display  EKG None  Radiology Dg Chest 2 View  Result Date: 11/07/2017 CLINICAL DATA:  Shortness of breath beginning last night. EXAM: CHEST - 2 VIEW COMPARISON:  10/23/2017 FINDINGS: Cardiac silhouette is normal in size. No mediastinal or hilar masses. No evidence of adenopathy. Clear  lungs. No pleural effusion or pneumothorax. Skeletal structures are intact. IMPRESSION: No active cardiopulmonary disease. Electronically Signed   By: Lajean Manes M.D.   On: 11/07/2017 16:32    Procedures Procedures (including critical care time)  Medications Ordered in ED Medications  ipratropium-albuterol (DUONEB) 0.5-2.5 (3) MG/3ML nebulizer solution 3 mL (3 mLs Nebulization Given 11/07/17 1715)  predniSONE (DELTASONE) tablet 60 mg (60 mg Oral Given 11/07/17 1657)  albuterol (PROVENTIL) (2.5 MG/3ML) 0.083% nebulizer solution 5 mg (5 mg Nebulization Given 11/07/17 1716)  albuterol (PROVENTIL HFA;VENTOLIN HFA) 108 (90 Base) MCG/ACT inhaler 2 puff (2 puffs Inhalation Given 11/07/17 1845)     Initial Impression / Assessment and Plan / ED Course  I have reviewed the triage vital signs and the nursing notes.  Pertinent labs & imaging results that were available during my care of the patient were reviewed by me and considered in my medical decision making (see chart for details).  Clinical Course as of Nov 09 1206  Nancy Fetter Nov 07, 2017  1712 On review of patient's prior visits she is here about twice a month for asthma exacerbations.  Unfortunately she  is self-pay with no PCP.  She would clearly benefit from a better asthma plan.   [MB]  3875 Chest x-ray with no signs of pneumothorax no pneumonia.  Currently she is in no distress and pulse ox 94% on room air.  We will start her back on prednisone and give her some breathing treatments here.   [MB]  1800 Evaluated patient she feels she is moving air better and feels comfortable going home.  She lacks any funds to actually get any of her medications so we are going to order her an albuterol inhaler here.  She is asking if we also have Atrovent and we are looking if we can get her left 2.   [MB]    Clinical Course User Index [MB] Hayden Rasmussen, MD    Final Clinical Impressions(s) / ED Diagnoses   Final diagnoses:  Moderate persistent asthma with exacerbation    ED Discharge Orders        Ordered    ipratropium (ATROVENT HFA) 17 MCG/ACT inhaler  Every 6 hours PRN     11/07/17 1805    albuterol (PROVENTIL) (2.5 MG/3ML) 0.083% nebulizer solution  Every 6 hours PRN     11/07/17 1805    predniSONE (DELTASONE) 20 MG tablet  Daily     11/07/17 1805       Hayden Rasmussen, MD 11/08/17 1209

## 2017-11-07 NOTE — ED Triage Notes (Signed)
Pt states that she has been sob since last night

## 2017-11-07 NOTE — Discharge Instructions (Signed)
Your evaluated in the emergency department for an asthma exacerbation.  Your chest x-ray did not show an obvious pneumonia.  You improved after some breathing treatments here.  We are placing you back on prednisone and giving you an albuterol inhaler.  We are also prescribing you Atrovent and the albuterol nebulizer refills.  You need to get a primary care doctor so you can help manage your asthma better.

## 2017-11-23 ENCOUNTER — Encounter (HOSPITAL_COMMUNITY): Payer: Self-pay | Admitting: Emergency Medicine

## 2017-11-23 ENCOUNTER — Other Ambulatory Visit: Payer: Self-pay

## 2017-11-23 ENCOUNTER — Emergency Department (HOSPITAL_COMMUNITY): Payer: Self-pay

## 2017-11-23 ENCOUNTER — Emergency Department (HOSPITAL_COMMUNITY)
Admission: EM | Admit: 2017-11-23 | Discharge: 2017-11-23 | Disposition: A | Discharge status: Home / Self Care | Payer: Self-pay | Attending: Emergency Medicine | Admitting: Emergency Medicine

## 2017-11-23 DIAGNOSIS — Z87891 Personal history of nicotine dependence: Secondary | ICD-10-CM | POA: Insufficient documentation

## 2017-11-23 DIAGNOSIS — J449 Chronic obstructive pulmonary disease, unspecified: Secondary | ICD-10-CM | POA: Insufficient documentation

## 2017-11-23 DIAGNOSIS — J189 Pneumonia, unspecified organism: Secondary | ICD-10-CM | POA: Insufficient documentation

## 2017-11-23 MED ORDER — PREDNISONE 10 MG PO TABS: 20.0000 mg | ORAL_TABLET | Freq: Every day | ORAL | 0 refills | Status: DC

## 2017-11-23 MED ORDER — AZITHROMYCIN 250 MG PO TABS
500.0000 mg | ORAL_TABLET | Freq: Once | ORAL | Status: AC
  Administered 2017-11-23: 500 mg via ORAL
  Filled 2017-11-23: qty 2

## 2017-11-23 MED ORDER — ALBUTEROL SULFATE (5 MG/ML) 0.5% IN NEBU: 2.5000 mg | INHALATION_SOLUTION | Freq: Four times a day (QID) | RESPIRATORY_TRACT | 3 refills | Status: DC | PRN

## 2017-11-23 MED ORDER — FLUTICASONE-SALMETEROL 113-14 MCG/ACT IN AEPB: 1.0000 | INHALATION_SPRAY | Freq: Two times a day (BID) | RESPIRATORY_TRACT | 1 refills | Status: DC

## 2017-11-23 MED ORDER — AZITHROMYCIN 250 MG PO TABS: ORAL_TABLET | ORAL | 0 refills | Status: DC

## 2017-11-23 MED ORDER — PREDNISONE 10 MG PO TABS
60.0000 mg | ORAL_TABLET | Freq: Once | ORAL | Status: AC
  Administered 2017-11-23: 22:00:00 60 mg via ORAL
  Filled 2017-11-23: qty 1

## 2017-11-23 MED ORDER — ALBUTEROL SULFATE HFA 108 (90 BASE) MCG/ACT IN AERS
2.0000 | INHALATION_SPRAY | Freq: Once | RESPIRATORY_TRACT | Status: AC
  Administered 2017-11-23: 2 via RESPIRATORY_TRACT
  Filled 2017-11-23: qty 6.7

## 2017-11-23 MED ORDER — IPRATROPIUM-ALBUTEROL 0.5-2.5 (3) MG/3ML IN SOLN
3.0000 mL | Freq: Once | RESPIRATORY_TRACT | Status: AC
  Administered 2017-11-23: 3 mL via RESPIRATORY_TRACT
  Filled 2017-11-23: qty 3

## 2017-11-23 MED ORDER — ALBUTEROL SULFATE (2.5 MG/3ML) 0.083% IN NEBU: 5.0000 mg | INHALATION_SOLUTION | Freq: Once | RESPIRATORY_TRACT | Status: DC

## 2017-11-23 NOTE — ED Triage Notes (Signed)
Pt with hx of asthma and COPD c/o worsening SOB that began yesterday. Pt ran out of nebulizer medication last night.

## 2017-11-23 NOTE — Discharge Instructions (Signed)
Chest x-ray shows a small amount of pneumonia.  Prescription for antibiotic, prednisone, inhaler, nebulizer solution.  No smoking.  Try to get a primary care doctor.

## 2017-11-24 NOTE — ED Notes (Addendum)
Pharmacist called to confirm prescription for albuterol.  Prescription changed to albuterol 2.5mg /47ml 0.083% be solution every 6 hours prn wheezing or sob per Dr. Lacinda Axon.

## 2017-11-24 NOTE — ED Provider Notes (Signed)
[facility] EMERGENCY DEPARTMENT Provider Note   CSN: [id number] Arrival date & time: 11/23/17  1846     History   Chief Complaint Chief Complaint  Patient presents with  . Shortness of Breath    HPI Linda Ellis is a 42 y.o. female.  Patient with a known history of COPD/emphysema presents with persistent coughing, wheezing, dyspnea.  She has run out of her metered-dose inhaler and nebulizer solution at home.  He has stopped smoking.  She is presently not on steroids.  No fever, sweats, chills, rusty sputum.  Severity of symptoms is moderate.  Nothing makes symptoms better or worse.     Past Medical History:  Diagnosis Date  . Asthma   . COPD (chronic obstructive pulmonary disease) ([place])   . Multiple gastric ulcers     Patient Active Problem List   Diagnosis Date Noted  . CAP (community acquired pneumonia) 06/24/2017  . Asthma, chronic, unspecified asthma severity, with acute exacerbation 06/24/2017  . Acute asthma exacerbation 06/24/2017    Past Surgical History:  Procedure Laterality Date  . CHOLECYSTECTOMY    . DIAGNOSTIC LAPAROSCOPY WITH REMOVAL OF ECTOPIC PREGNANCY Right 01/24/2017   Procedure: REMOVAL OF ECTOPIC PREGNANCY;  Surgeon: Florian Buff, MD;  Location: [facility];  Service: Gynecology;  Laterality: Right;  . LAPAROSCOPIC UNILATERAL SALPINGECTOMY Right 01/24/2017   Procedure: LAPAROSCOPIC UNILATERAL SALPINGECTOMY;  Surgeon: Florian Buff, MD;  Location: [facility];  Service: Gynecology;  Laterality: Right;     OB History    Gravida  5   Para  4   Term  4   Preterm      AB      Living  4     SAB      TAB      Ectopic      Multiple      Live Births               Home Medications    Prior to Admission medications   Medication Sig Start Date End Date Taking? Authorizing Provider  ranitidine (ZANTAC) 150 MG tablet Take 150-300 mg by mouth daily as needed for heartburn.    Yes [provider]  albuterol (PROVENTIL) (5 MG/ML)  0.5% nebulizer solution Take 0.5 mLs (2.5 mg total) by nebulization every 6 (six) hours as needed for wheezing or shortness of breath. 11/23/17   Nat Christen, MD  azithromycin [facility]) 250 MG tablet 1 tablet daily starting Wednesday evening 11/23/17   Nat Christen, MD  Fluticasone-Salmeterol [facility] RESPICLICK 026/37) 858-85 MCG/ACT AEPB Inhale 1 Inhaler into the lungs 2 (two) times daily. 11/23/17   Nat Christen, MD  ipratropium (ATROVENT HFA) 17 MCG/ACT inhaler Inhale 2 puffs into the lungs every 6 (six) hours as needed for wheezing. Patient not taking: Reported on 11/23/2017 11/07/17   Hayden Rasmussen, MD  predniSONE (DELTASONE) 10 MG tablet Take 2 tablets (20 mg total) by mouth daily. 11/23/17   Nat Christen, MD  ALBUTEROL IN Inhale 2 puffs into the lungs as needed. For shortness of breath   10/24/11  [provider]    Family History Family History  Problem Relation Age of Onset  . Diabetes Maternal Grandfather   . Heart attack Maternal Grandfather     Social History Social History   Tobacco Use  . Smoking status: Former Smoker    Packs/day: 0.50    Years: 22.00    Pack years: 11.00    Types: Cigarettes    Last  attempt to quit: 02/18/2017    Years since quitting: 0.7  . Smokeless tobacco: Never Used  Substance Use Topics  . Alcohol use: Yes    Comment: occasionally  . Drug use: No     Allergies   Patient has no known allergies.   Review of Systems Review of Systems  All other systems reviewed and are negative.    Physical Exam Updated Vital Signs BP 125/79   Pulse 88   Temp (!) 97.5 F (36.4 C) (Oral)   Resp 19   Ht 5\' 5"  (1.651 m)   Wt 136.1 kg (300 lb)   LMP 11/13/2017   SpO2 94%   BMI 49.92 kg/m   Physical Exam  Constitutional: She is oriented to person, place, and time. She appears well-developed and well-nourished.  HENT:  Head: Normocephalic and atraumatic.  Eyes: Conjunctivae are normal.  Neck: Neck supple.  Cardiovascular: Normal rate  and regular rhythm.  Pulmonary/Chest:  No obvious tachypnea or dyspnea, bilateral expiratory wheezes  Abdominal: Soft. Bowel sounds are normal.  Musculoskeletal: Normal range of motion.  Neurological: She is alert and oriented to person, place, and time.  Skin: Skin is warm and dry.  Psychiatric: She has a normal mood and affect. Her behavior is normal.  Nursing note and vitals reviewed.    ED Treatments / Results  Labs (all labs ordered are listed, but only abnormal results are displayed) Labs Reviewed - No data to display  EKG EKG Interpretation  Date/Time:  Tuesday November 23 2017 18:58:54 EDT Ventricular Rate:  87 PR Interval:    QRS Duration: 105 QT Interval:  374 QTC Calculation: 450 R Axis:   98 Text Interpretation:  Sinus rhythm Borderline right axis deviation Low voltage, precordial leads Since last tracing rate slower Confirmed by Orlie Dakin [phone]) on 11/23/2017 7:07:21 PM   Radiology Dg Chest 2 View  Result Date: 11/23/2017 CLINICAL DATA:  Shortness of breath.  Asthma. EXAM: CHEST - 2 VIEW COMPARISON:  November 07, 2017 FINDINGS: Mild bibasilar opacities. No other interval changes or acute abnormalities. IMPRESSION: Mild bibasilar opacities.  Recommend follow-up to resolution. Electronically Signed   By: Dorise Bullion III M.D   On: 11/23/2017 19:20    Procedures Procedures (including critical care time)  Medications Ordered in ED Medications  azithromycin (ZITHROMAX) tablet 500 mg (500 mg Oral Given 11/23/17 2130)  ipratropium-albuterol (DUONEB) 0.5-2.5 (3) MG/3ML nebulizer solution 3 mL (3 mLs Nebulization Given 11/23/17 2158)  albuterol (PROVENTIL HFA;VENTOLIN HFA) 108 (90 Base) MCG/ACT inhaler 2 puff (2 puffs Inhalation Given 11/23/17 2129)  predniSONE (DELTASONE) tablet 60 mg (60 mg Oral Given 11/23/17 2130)     Initial Impression / Assessment and Plan / ED Course  I have reviewed the triage vital signs and the nursing notes.  Pertinent labs & imaging  results that were available during my care of the patient were reviewed by me and considered in my medical decision making (see chart for details).     Patient presents with persistent coughing and wheezing.  Chest x-ray shows bibasilar opacities.  She was given IV steroids, nebulizer treatments, prednisone, antibiotics in the emergency department.  She is stable for discharge.  Discharge medications include Zithromax, prednisone, albuterol nebulizing solution, Airduo metered-dose inhaler.  All these instructions were discussed with the patient.  Final Clinical Impressions(s) / ED Diagnoses   Final diagnoses:  Community acquired pneumonia, unspecified laterality    ED Discharge Orders        Ordered    azithromycin (  ZITHROMAX) 250 MG tablet     11/23/17 2223    predniSONE (DELTASONE) 10 MG tablet  Daily     11/23/17 2223    Fluticasone-Salmeterol (AIRDUO RESPICLICK 619/50) 932-67 MCG/ACT AEPB  2 times daily     11/23/17 2223    albuterol (PROVENTIL) (5 MG/ML) 0.5% nebulizer solution  Every 6 hours PRN     11/23/17 2223       Nat Christen, MD 11/24/17 1423

## 2018-01-06 ENCOUNTER — Emergency Department (HOSPITAL_COMMUNITY): Payer: Self-pay

## 2018-01-06 ENCOUNTER — Other Ambulatory Visit: Payer: Self-pay

## 2018-01-06 ENCOUNTER — Encounter (HOSPITAL_COMMUNITY): Payer: Self-pay | Admitting: Emergency Medicine

## 2018-01-06 ENCOUNTER — Emergency Department (HOSPITAL_COMMUNITY)
Admission: EM | Admit: 2018-01-06 | Discharge: 2018-01-06 | Disposition: A | Discharge status: Home / Self Care | Payer: Self-pay | Attending: Emergency Medicine | Admitting: Emergency Medicine

## 2018-01-06 DIAGNOSIS — J449 Chronic obstructive pulmonary disease, unspecified: Secondary | ICD-10-CM | POA: Insufficient documentation

## 2018-01-06 DIAGNOSIS — R112 Nausea with vomiting, unspecified: Secondary | ICD-10-CM | POA: Insufficient documentation

## 2018-01-06 DIAGNOSIS — R197 Diarrhea, unspecified: Secondary | ICD-10-CM | POA: Insufficient documentation

## 2018-01-06 DIAGNOSIS — Z87891 Personal history of nicotine dependence: Secondary | ICD-10-CM | POA: Insufficient documentation

## 2018-01-06 DIAGNOSIS — Z79899 Other long term (current) drug therapy: Secondary | ICD-10-CM | POA: Insufficient documentation

## 2018-01-06 LAB — URINALYSIS, ROUTINE W REFLEX MICROSCOPIC
BACTERIA UA: NONE SEEN
BILIRUBIN URINE: NEGATIVE
Glucose, UA: NEGATIVE mg/dL
Ketones, ur: NEGATIVE mg/dL
NITRITE: NEGATIVE
PH: 5 (ref 5.0–8.0)
Protein, ur: NEGATIVE mg/dL
SPECIFIC GRAVITY, URINE: 1.026 (ref 1.005–1.030)

## 2018-01-06 LAB — COMPREHENSIVE METABOLIC PANEL
ALK PHOS: 83 U/L (ref 38–126)
ALT: 21 U/L (ref 14–54)
ANION GAP: 7 (ref 5–15)
AST: 18 U/L (ref 15–41)
Albumin: 3.6 g/dL (ref 3.5–5.0)
BUN: 7 mg/dL (ref 6–20)
CALCIUM: 8.8 mg/dL — AB (ref 8.9–10.3)
CO2: 22 mmol/L (ref 22–32)
CREATININE: 0.71 mg/dL (ref 0.44–1.00)
Chloride: 109 mmol/L (ref 101–111)
GFR calc non Af Amer: >60 mL/min (ref >60)
Glucose, Bld: 106 mg/dL — ABNORMAL HIGH (ref 65–99)
Potassium: 4 mmol/L (ref 3.5–5.1)
SODIUM: 138 mmol/L (ref 135–145)
TOTAL PROTEIN: 6.9 g/dL (ref 6.5–8.1)
Total Bilirubin: 0.6 mg/dL (ref 0.3–1.2)

## 2018-01-06 LAB — CBC WITH DIFFERENTIAL/PLATELET
Basophils Absolute: 0 10*3/uL (ref 0.0–0.1)
Basophils Relative: 0 %
EOS ABS: 0.3 10*3/uL (ref 0.0–0.7)
EOS PCT: 4 %
HCT: 42.3 % (ref 36.0–46.0)
HEMOGLOBIN: 14 g/dL (ref 12.0–15.0)
LYMPHS ABS: 3.2 10*3/uL (ref 0.7–4.0)
LYMPHS PCT: 40 %
MCH: 30 pg (ref 26.0–34.0)
MCHC: 33.1 g/dL (ref 30.0–36.0)
MCV: 90.8 fL (ref 78.0–100.0)
MONOS PCT: 6 %
Monocytes Absolute: 0.4 10*3/uL (ref 0.1–1.0)
Neutro Abs: 4.1 10*3/uL (ref 1.7–7.7)
Neutrophils Relative %: 50 %
Platelets: 345 10*3/uL (ref 150–400)
RBC: 4.66 MIL/uL (ref 3.87–5.11)
RDW: 13.2 % (ref 11.5–15.5)
WBC: 8 10*3/uL (ref 4.0–10.5)

## 2018-01-06 LAB — PREGNANCY, URINE: Preg Test, Ur: NEGATIVE

## 2018-01-06 LAB — LIPASE, BLOOD: Lipase: 29 U/L (ref 11–51)

## 2018-01-06 MED ORDER — DICYCLOMINE HCL 20 MG PO TABS: ORAL_TABLET | ORAL | 0 refills | Status: DC

## 2018-01-06 MED ORDER — SODIUM CHLORIDE 0.9 % IV BOLUS
1000.0000 mL | Freq: Once | INTRAVENOUS | Status: AC
  Administered 2018-01-06: 1000 mL via INTRAVENOUS

## 2018-01-06 MED ORDER — KETOROLAC TROMETHAMINE 30 MG/ML IJ SOLN
30.0000 mg | Freq: Once | INTRAMUSCULAR | Status: AC
  Administered 2018-01-06: 30 mg via INTRAVENOUS
  Filled 2018-01-06: qty 1

## 2018-01-06 MED ORDER — ONDANSETRON 4 MG PO TBDP: ORAL_TABLET | ORAL | 0 refills | Status: DC

## 2018-01-06 NOTE — ED Provider Notes (Signed)
[facility] EMERGENCY DEPARTMENT Provider Note   CSN: [id number] Arrival date & time: 01/06/18  1222     History   Chief Complaint Chief Complaint  Patient presents with  . Diarrhea    HPI Linda Ellis is a 42 y.o. female.  Patient complains of vomiting and diarrhea.  The vomiting has stopped the diarrhea is continued for the last 6 days  The history is provided by the patient. No language interpreter was used.  Diarrhea   This is a new problem. The current episode started more than 2 days ago. The problem occurs 2 to 4 times per day. The problem has not changed since onset.The stool consistency is described as watery. There has been no fever. Pertinent negatives include no abdominal pain, no chills, no headaches and no cough.    Past Medical History:  Diagnosis Date  . Asthma   . COPD (chronic obstructive pulmonary disease) ([place])   . Multiple gastric ulcers     Patient Active Problem List   Diagnosis Date Noted  . CAP (community acquired pneumonia) 06/24/2017  . Asthma, chronic, unspecified asthma severity, with acute exacerbation 06/24/2017  . Acute asthma exacerbation 06/24/2017    Past Surgical History:  Procedure Laterality Date  . CHOLECYSTECTOMY    . DIAGNOSTIC LAPAROSCOPY WITH REMOVAL OF ECTOPIC PREGNANCY Right 01/24/2017   Procedure: REMOVAL OF ECTOPIC PREGNANCY;  Surgeon: Florian Buff, MD;  Location: [facility];  Service: Gynecology;  Laterality: Right;  . LAPAROSCOPIC UNILATERAL SALPINGECTOMY Right 01/24/2017   Procedure: LAPAROSCOPIC UNILATERAL SALPINGECTOMY;  Surgeon: Florian Buff, MD;  Location: [facility];  Service: Gynecology;  Laterality: Right;     OB History    Gravida  5   Para  4   Term  4   Preterm      AB      Living  4     SAB      TAB      Ectopic      Multiple      Live Births               Home Medications    Prior to Admission medications   Medication Sig Start Date End Date Taking? Authorizing Provider    albuterol (PROVENTIL) (2.5 MG/3ML) 0.083% nebulizer solution Inhale 3 mLs into the lungs every 6 (six) hours as needed. 12/31/17   [provider]  azithromycin (ZITHROMAX) 250 MG tablet 1 tablet daily starting Wednesday evening Patient not taking: Reported on 01/06/2018 11/23/17   Nat Christen, MD  dicyclomine (BENTYL) 20 MG tablet Take one every 8 hours if needed for abdominal pain 01/06/18   Milton Ferguson, MD  Fluticasone-Salmeterol [facility] RESPICLICK 300/76) 226-33 MCG/ACT AEPB Inhale 1 Inhaler into the lungs 2 (two) times daily. 11/23/17   Nat Christen, MD  ipratropium (ATROVENT HFA) 17 MCG/ACT inhaler Inhale 2 puffs into the lungs every 6 (six) hours as needed for wheezing. Patient not taking: Reported on 11/23/2017 11/07/17   Hayden Rasmussen, MD  predniSONE (DELTASONE) 10 MG tablet Take 2 tablets (20 mg total) by mouth daily. 11/23/17   Nat Christen, MD  ranitidine (ZANTAC) 150 MG tablet Take 150-300 mg by mouth daily as needed for heartburn.     [provider]  ALBUTEROL IN Inhale 2 puffs into the lungs as needed. For shortness of breath   10/24/11  [provider]    Family History Family History  Problem Relation Age of Onset  . Diabetes Maternal Grandfather   .  Heart attack Maternal Grandfather     Social History Social History   Tobacco Use  . Smoking status: Former Smoker    Packs/day: 0.50    Years: 22.00    Pack years: 11.00    Types: Cigarettes    Last attempt to quit: 02/18/2017    Years since quitting: 0.8  . Smokeless tobacco: Never Used  Substance Use Topics  . Alcohol use: Yes    Comment: occasionally  . Drug use: No     Allergies   Patient has no known allergies.   Review of Systems Review of Systems  Constitutional: Negative for appetite change, chills and fatigue.  HENT: Negative for congestion, ear discharge and sinus pressure.   Eyes: Negative for discharge.  Respiratory: Negative for cough.   Cardiovascular: Negative for  chest pain.  Gastrointestinal: Positive for diarrhea. Negative for abdominal pain.  Genitourinary: Negative for frequency and hematuria.  Musculoskeletal: Negative for back pain.  Skin: Negative for rash.  Neurological: Negative for seizures and headaches.  Psychiatric/Behavioral: Negative for hallucinations.     Physical Exam Updated Vital Signs BP 112/80   Pulse 81   Temp 98.4 F (36.9 C) (Oral)   Resp 20   Ht 5\' 5"  (1.651 m)   Wt 136.1 kg (300 lb)   LMP 12/10/2017   SpO2 100%   BMI 49.92 kg/m   Physical Exam  Constitutional: She is oriented to person, place, and time. She appears well-developed.  HENT:  Head: Normocephalic.  Eyes: Conjunctivae and EOM are normal. No scleral icterus.  Neck: Neck supple. No thyromegaly present.  Cardiovascular: Normal rate and regular rhythm. Exam reveals no gallop and no friction rub.  No murmur heard. Pulmonary/Chest: No stridor. She has no wheezes. She has no rales. She exhibits no tenderness.  Abdominal: She exhibits no distension. There is no tenderness. There is no rebound.  Musculoskeletal: Normal range of motion. She exhibits no edema.  Lymphadenopathy:    She has no cervical adenopathy.  Neurological: She is oriented to person, place, and time. She exhibits normal muscle tone. Coordination normal.  Skin: No rash noted. No erythema.  Psychiatric: She has a normal mood and affect. Her behavior is normal.     ED Treatments / Results  Labs (all labs ordered are listed, but only abnormal results are displayed) Labs Reviewed  COMPREHENSIVE METABOLIC PANEL - Abnormal; Notable for the following components:      Result Value   Glucose, Bld 106 (*)    Calcium 8.8 (*)    All other components within normal limits  URINALYSIS, ROUTINE W REFLEX MICROSCOPIC - Abnormal; Notable for the following components:   APPearance HAZY (*)    Hgb urine dipstick SMALL (*)    Leukocytes, UA SMALL (*)    All other components within normal limits   LIPASE, BLOOD  CBC WITH DIFFERENTIAL/PLATELET  PREGNANCY, URINE    EKG None  Radiology Dg Abd Acute W/chest  Result Date: 01/06/2018 CLINICAL DATA:  Diarrhea and abdominal pain for several days EXAM: DG ABDOMEN ACUTE W/ 1V CHEST COMPARISON:  11/23/2017 FINDINGS: Cardiac shadow is stable. Mild bibasilar scarring are noted stable from the previous exam. No new focal infiltrate is seen. Scattered large and small bowel gas is noted. No obstructive changes are noted. No abnormal mass or abnormal calcifications are seen. No bony abnormality is noted. IMPRESSION: Stable bibasilar scarring. No acute abnormality is noted. Electronically Signed   By: Inez Catalina M.D.   On: 01/06/2018 14:24  Procedures Procedures (including critical care time)  Medications Ordered in ED Medications  sodium chloride 0.9 % bolus 1,000 mL (0 mLs Intravenous Stopped 01/06/18 1429)  ketorolac (TORADOL) 30 MG/ML injection 30 mg (30 mg Intravenous Given 01/06/18 1328)     Initial Impression / Assessment and Plan / ED Course  I have reviewed the triage vital signs and the nursing notes.  Pertinent labs & imaging results that were available during my care of the patient were reviewed by me and considered in my medical decision making (see chart for details).     Patient with gastroenteritis.  She will be given Bentyl to help with the cramping and follow-up with GI if not improving  Final Clinical Impressions(s) / ED Diagnoses   Final diagnoses:  Nausea vomiting and diarrhea    ED Discharge Orders        Ordered    dicyclomine (BENTYL) 20 MG tablet     01/06/18 1510       Milton Ferguson, MD 01/06/18 1514

## 2018-01-06 NOTE — ED Triage Notes (Signed)
Diarrhea x 5 days now several times a day, states it is "acid coming out". Vomited x 3 Monday but none since. C/o lower abd pain

## 2018-01-06 NOTE — Discharge Instructions (Signed)
Follow-up with Dr. Oneida Alar gastroenterology if not improving.  Drink plenty of fluids

## 2018-01-26 ENCOUNTER — Emergency Department (HOSPITAL_COMMUNITY)
Admission: EM | Admit: 2018-01-26 | Discharge: 2018-01-26 | Disposition: A | Discharge status: Home / Self Care | Payer: Self-pay | Attending: Emergency Medicine | Admitting: Emergency Medicine

## 2018-01-26 ENCOUNTER — Encounter (HOSPITAL_COMMUNITY): Payer: Self-pay | Admitting: Emergency Medicine

## 2018-01-26 ENCOUNTER — Other Ambulatory Visit: Payer: Self-pay

## 2018-01-26 DIAGNOSIS — J4521 Mild intermittent asthma with (acute) exacerbation: Secondary | ICD-10-CM | POA: Insufficient documentation

## 2018-01-26 DIAGNOSIS — Z79899 Other long term (current) drug therapy: Secondary | ICD-10-CM | POA: Insufficient documentation

## 2018-01-26 DIAGNOSIS — Z87891 Personal history of nicotine dependence: Secondary | ICD-10-CM | POA: Insufficient documentation

## 2018-01-26 MED ORDER — ALBUTEROL SULFATE (2.5 MG/3ML) 0.083% IN NEBU
2.5000 mg | INHALATION_SOLUTION | Freq: Once | RESPIRATORY_TRACT | Status: AC
  Administered 2018-01-26: 2.5 mg via RESPIRATORY_TRACT
  Filled 2018-01-26: qty 3

## 2018-01-26 MED ORDER — IPRATROPIUM-ALBUTEROL 0.5-2.5 (3) MG/3ML IN SOLN
3.0000 mL | Freq: Once | RESPIRATORY_TRACT | Status: AC
  Administered 2018-01-26: 3 mL via RESPIRATORY_TRACT
  Filled 2018-01-26: qty 3

## 2018-01-26 MED ORDER — PREDNISONE 20 MG PO TABS: ORAL_TABLET | ORAL | 0 refills | Status: DC

## 2018-01-26 MED ORDER — ALBUTEROL SULFATE (2.5 MG/3ML) 0.083% IN NEBU: 2.5000 mg | INHALATION_SOLUTION | Freq: Four times a day (QID) | RESPIRATORY_TRACT | 1 refills | Status: DC | PRN

## 2018-01-26 MED ORDER — FLUTICASONE-SALMETEROL 113-14 MCG/ACT IN AEPB: 1.0000 | INHALATION_SPRAY | Freq: Two times a day (BID) | RESPIRATORY_TRACT | 1 refills | Status: DC

## 2018-01-26 MED ORDER — PREDNISONE 10 MG PO TABS
60.0000 mg | ORAL_TABLET | Freq: Once | ORAL | Status: AC
  Administered 2018-01-26: 60 mg via ORAL
  Filled 2018-01-26: qty 1

## 2018-01-26 NOTE — Discharge Instructions (Addendum)
Follow up with a family md in 2-4 weeks.  Sooner if problems

## 2018-01-26 NOTE — ED Provider Notes (Signed)
[facility] EMERGENCY DEPARTMENT Provider Note   CSN: [id number] Arrival date & time: 01/26/18  [place]     History   Chief Complaint Chief Complaint  Patient presents with  . Shortness of Breath    HPI Linda Ellis is a 42 y.o. female.  Patient complains of shortness of breath.  Patient states she ran out of her inhaler and her neb treatments.  She states she has not had a cough or congestion.  The history is provided by the patient. No language interpreter was used.  Shortness of Breath  This is a recurrent problem. The problem occurs continuously.The current episode started 2 days ago. The problem has not changed since onset.Associated symptoms include wheezing. Pertinent negatives include no fever, no headaches, no cough, no chest pain, no abdominal pain and no rash. It is unknown what precipitated the problem. Risk factors: Asthma. She has tried ipratropium inhalers for the symptoms.    Past Medical History:  Diagnosis Date  . Asthma   . COPD (chronic obstructive pulmonary disease) ([place])   . Multiple gastric ulcers     Patient Active Problem List   Diagnosis Date Noted  . CAP (community acquired pneumonia) 06/24/2017  . Asthma, chronic, unspecified asthma severity, with acute exacerbation 06/24/2017  . Acute asthma exacerbation 06/24/2017    Past Surgical History:  Procedure Laterality Date  . CHOLECYSTECTOMY    . DIAGNOSTIC LAPAROSCOPY WITH REMOVAL OF ECTOPIC PREGNANCY Right 01/24/2017   Procedure: REMOVAL OF ECTOPIC PREGNANCY;  Surgeon: Florian Buff, MD;  Location: [facility];  Service: Gynecology;  Laterality: Right;  . LAPAROSCOPIC UNILATERAL SALPINGECTOMY Right 01/24/2017   Procedure: LAPAROSCOPIC UNILATERAL SALPINGECTOMY;  Surgeon: Florian Buff, MD;  Location: [facility];  Service: Gynecology;  Laterality: Right;     OB History    Gravida  5   Para  4   Term  4   Preterm      AB      Living  4     SAB      TAB      Ectopic      Multiple      Live Births               Home Medications    Prior to Admission medications   Medication Sig Start Date End Date Taking? Authorizing Provider  APPLE CIDER VINEGAR PO Take 3 capsules by mouth daily.   Yes [provider]  ranitidine (ZANTAC) 150 MG tablet Take 150-300 mg by mouth daily as needed for heartburn.    Yes [provider]  senna (SENOKOT) 8.6 MG tablet Take 1-2 tablets by mouth daily as needed for constipation.   Yes [provider]  albuterol (PROVENTIL) (2.5 MG/3ML) 0.083% nebulizer solution Take 3 mLs (2.5 mg total) by nebulization every 6 (six) hours as needed for wheezing or shortness of breath. 01/26/18   Milton Ferguson, MD  azithromycin (ZITHROMAX) 250 MG tablet 1 tablet daily starting Wednesday evening Patient not taking: Reported on 01/06/2018 11/23/17   Nat Christen, MD  dicyclomine (BENTYL) 20 MG tablet Take one every 8 hours if needed for abdominal pain Patient not taking: Reported on 01/26/2018 01/06/18   Milton Ferguson, MD  Fluticasone-Salmeterol 113-14 MCG/ACT AEPB Inhale 1 Inhaler into the lungs 2 (two) times daily. 01/26/18   Milton Ferguson, MD  ipratropium (ATROVENT HFA) 17 MCG/ACT inhaler Inhale 2 puffs into the lungs every 6 (six) hours as needed for wheezing. Patient not taking: Reported on 11/23/2017  11/07/17   Hayden Rasmussen, MD  ondansetron (ZOFRAN ODT) 4 MG disintegrating tablet 4mg  ODT q4 hours prn nausea/vomit Patient not taking: Reported on 01/26/2018 01/06/18   Milton Ferguson, MD  predniSONE (DELTASONE) 20 MG tablet 2 tabs po daily x 3 days 01/26/18   Milton Ferguson, MD  ALBUTEROL IN Inhale 2 puffs into the lungs as needed. For shortness of breath   10/24/11  [provider]    Family History Family History  Problem Relation Age of Onset  . Diabetes Maternal Grandfather   . Heart attack Maternal Grandfather     Social History Social History   Tobacco Use  . Smoking status: Former Smoker    Packs/day: 0.50     Years: 22.00    Pack years: 11.00    Types: Cigarettes    Last attempt to quit: 02/18/2017    Years since quitting: 0.9  . Smokeless tobacco: Never Used  Substance Use Topics  . Alcohol use: Yes    Comment: occasionally  . Drug use: No     Allergies   Patient has no known allergies.   Review of Systems Review of Systems  Constitutional: Negative for appetite change, fatigue and fever.  HENT: Negative for congestion, ear discharge and sinus pressure.   Eyes: Negative for discharge.  Respiratory: Positive for shortness of breath and wheezing. Negative for cough.   Cardiovascular: Negative for chest pain.  Gastrointestinal: Negative for abdominal pain and diarrhea.  Genitourinary: Negative for frequency and hematuria.  Musculoskeletal: Negative for back pain.  Skin: Negative for rash.  Neurological: Negative for seizures and headaches.  Psychiatric/Behavioral: Negative for hallucinations.     Physical Exam Updated Vital Signs BP 106/60   Pulse 85   Temp 98.6 F (37 C) (Oral)   Resp 15   Ht 5\' 5"  (1.651 m)   Wt 136.1 kg (300 lb)   LMP 01/07/2018   SpO2 94%   BMI 49.92 kg/m   Physical Exam  Constitutional: She is oriented to person, place, and time. She appears well-developed.  HENT:  Head: Normocephalic.  Eyes: Conjunctivae and EOM are normal. No scleral icterus.  Neck: Neck supple. No thyromegaly present.  Cardiovascular: Normal rate and regular rhythm. Exam reveals no gallop and no friction rub.  No murmur heard. Pulmonary/Chest: No stridor. She has wheezes. She has no rales. She exhibits no tenderness.  Abdominal: She exhibits no distension. There is no tenderness. There is no rebound.  Musculoskeletal: Normal range of motion. She exhibits no edema.  Lymphadenopathy:    She has no cervical adenopathy.  Neurological: She is oriented to person, place, and time. She exhibits normal muscle tone. Coordination normal.  Skin: No rash noted. No erythema.    Psychiatric: She has a normal mood and affect. Her behavior is normal.     ED Treatments / Results  Labs (all labs ordered are listed, but only abnormal results are displayed) Labs Reviewed - No data to display  EKG None  Radiology No results found.  Procedures Procedures (including critical care time)  Medications Ordered in ED Medications  predniSONE (DELTASONE) tablet 60 mg (60 mg Oral Given 01/26/18 1955)  ipratropium-albuterol (DUONEB) 0.5-2.5 (3) MG/3ML nebulizer solution 3 mL (3 mLs Nebulization Given 01/26/18 1925)  albuterol (PROVENTIL) (2.5 MG/3ML) 0.083% nebulizer solution 2.5 mg (2.5 mg Nebulization Given 01/26/18 1925)  ipratropium-albuterol (DUONEB) 0.5-2.5 (3) MG/3ML nebulizer solution 3 mL (3 mLs Nebulization Given 01/26/18 2048)  albuterol (PROVENTIL) (2.5 MG/3ML) 0.083% nebulizer solution 2.5 mg (2.5  mg Nebulization Given 01/26/18 2048)     Initial Impression / Assessment and Plan / ED Course  I have reviewed the triage vital signs and the nursing notes.  Pertinent labs & imaging results that were available during my care of the patient were reviewed by me and considered in my medical decision making (see chart for details).     Patient improved with 2 neb treatments.  She was discharged home with a 3-day course of prednisone and given prescriptions for her nebulizer and her inhaler.  She will follow-up in 2 to 4 weeks unless she has problems sooner  Final Clinical Impressions(s) / ED Diagnoses   Final diagnoses:  Mild intermittent asthma with exacerbation    ED Discharge Orders        Ordered    predniSONE (DELTASONE) 20 MG tablet     01/26/18 2146    albuterol (PROVENTIL) (2.5 MG/3ML) 0.083% nebulizer solution  Every 6 hours PRN     01/26/18 2146    Fluticasone-Salmeterol 113-14 MCG/ACT AEPB  2 times daily     01/26/18 2146       Milton Ferguson, MD 01/26/18 2150

## 2018-01-26 NOTE — ED Triage Notes (Signed)
Pt states she has asthma and copd and has ran out of her medicine. States today she was outside grilling and she thinks the smoke cause her to become sob.

## 2018-02-15 ENCOUNTER — Other Ambulatory Visit: Payer: Self-pay

## 2018-02-15 ENCOUNTER — Emergency Department (HOSPITAL_COMMUNITY): Payer: Self-pay

## 2018-02-15 ENCOUNTER — Emergency Department (HOSPITAL_COMMUNITY)
Admission: EM | Admit: 2018-02-15 | Discharge: 2018-02-15 | Disposition: A | Discharge status: Home / Self Care | Payer: Self-pay | Attending: Emergency Medicine | Admitting: Emergency Medicine

## 2018-02-15 ENCOUNTER — Encounter (HOSPITAL_COMMUNITY): Payer: Self-pay | Admitting: *Deleted

## 2018-02-15 DIAGNOSIS — J4521 Mild intermittent asthma with (acute) exacerbation: Secondary | ICD-10-CM | POA: Insufficient documentation

## 2018-02-15 DIAGNOSIS — Z79899 Other long term (current) drug therapy: Secondary | ICD-10-CM | POA: Insufficient documentation

## 2018-02-15 DIAGNOSIS — Z87891 Personal history of nicotine dependence: Secondary | ICD-10-CM | POA: Insufficient documentation

## 2018-02-15 MED ORDER — ALBUTEROL SULFATE HFA 108 (90 BASE) MCG/ACT IN AERS
2.0000 | INHALATION_SPRAY | Freq: Four times a day (QID) | RESPIRATORY_TRACT | Status: DC | PRN
  Filled 2018-02-15: qty 6.7

## 2018-02-15 MED ORDER — PREDNISONE 20 MG PO TABS: ORAL_TABLET | ORAL | 0 refills | Status: DC

## 2018-02-15 MED ORDER — ALBUTEROL (5 MG/ML) CONTINUOUS INHALATION SOLN
10.0000 mg/h | INHALATION_SOLUTION | Freq: Once | RESPIRATORY_TRACT | Status: AC
  Administered 2018-02-15: 10 mg/h via RESPIRATORY_TRACT
  Filled 2018-02-15: qty 20

## 2018-02-15 MED ORDER — ALBUTEROL SULFATE (2.5 MG/3ML) 0.083% IN NEBU
5.0000 mg | INHALATION_SOLUTION | Freq: Once | RESPIRATORY_TRACT | Status: AC
  Administered 2018-02-15: 5 mg via RESPIRATORY_TRACT
  Filled 2018-02-15: qty 6

## 2018-02-15 MED ORDER — PREDNISONE 50 MG PO TABS
60.0000 mg | ORAL_TABLET | Freq: Once | ORAL | Status: AC
  Administered 2018-02-15: 60 mg via ORAL
  Filled 2018-02-15: qty 1

## 2018-02-15 NOTE — ED Triage Notes (Signed)
Pt c/o sob that started x 2 days ago; pt has been using inhaler and nebulizer without any relief

## 2018-02-15 NOTE — ED Notes (Signed)
Pt tolerated ambulation well, steady gait and o2 sats maintaining at 97-98% on RA

## 2018-02-15 NOTE — ED Provider Notes (Signed)
[facility] EMERGENCY DEPARTMENT Provider Note   CSN: [id number] Arrival date & time: 02/15/18  0048  Time seen 01:20 AM   History   Chief Complaint Chief Complaint  Patient presents with  . Shortness of Breath    HPI Linda Ellis is a 42 y.o. female.  HPI   patient has a history of reactive airway disease.  She states about 2 days ago she started coughing and sometimes has clear mucus.  She has had sneezing but denies rhinorrhea or sore throat.  She states her chest feels tight in her chest hurts when she coughs and her abdomen is sore from coughing.  She states she ran out of DayQuil a couple days ago.  She denies nausea, vomiting, or diarrhea.  She denies sore throat or rhinorrhea.  She states she has a inhaler and a nebulizer and when she uses it it only helps for about 30 to 60 minutes and then she gets worse again.  She states however it does not stop the wheezing even after she feels better.  She states the only thing different is they have a friend staying with them who has a Equities trader.  She is never lived in a household with a dog before.  She is unaware of any prior dog allergy.  PCP Patient, No Pcp Per   Past Medical History:  Diagnosis Date  . Asthma   . COPD (chronic obstructive pulmonary disease) ([place])   . Multiple gastric ulcers     Patient Active Problem List   Diagnosis Date Noted  . CAP (community acquired pneumonia) 06/24/2017  . Asthma, chronic, unspecified asthma severity, with acute exacerbation 06/24/2017  . Acute asthma exacerbation 06/24/2017    Past Surgical History:  Procedure Laterality Date  . CHOLECYSTECTOMY    . DIAGNOSTIC LAPAROSCOPY WITH REMOVAL OF ECTOPIC PREGNANCY Right 01/24/2017   Procedure: REMOVAL OF ECTOPIC PREGNANCY;  Surgeon: Florian Buff, MD;  Location: [facility];  Service: Gynecology;  Laterality: Right;  . LAPAROSCOPIC UNILATERAL SALPINGECTOMY Right 01/24/2017   Procedure: LAPAROSCOPIC UNILATERAL SALPINGECTOMY;  Surgeon:  Florian Buff, MD;  Location: [facility];  Service: Gynecology;  Laterality: Right;     OB History    Gravida  5   Para  4   Term  4   Preterm      AB      Living  4     SAB      TAB      Ectopic      Multiple      Live Births               Home Medications    Prior to Admission medications   Medication Sig Start Date End Date Taking? Authorizing Provider  albuterol (PROVENTIL) (2.5 MG/3ML) 0.083% nebulizer solution Take 3 mLs (2.5 mg total) by nebulization every 6 (six) hours as needed for wheezing or shortness of breath. 01/26/18   Milton Ferguson, MD  APPLE CIDER VINEGAR PO Take 3 capsules by mouth daily.    [provider]  azithromycin (ZITHROMAX) 250 MG tablet 1 tablet daily starting Wednesday evening Patient not taking: Reported on 01/06/2018 11/23/17   Nat Christen, MD  dicyclomine (BENTYL) 20 MG tablet Take one every 8 hours if needed for abdominal pain Patient not taking: Reported on 01/26/2018 01/06/18   Milton Ferguson, MD  Fluticasone-Salmeterol 113-14 MCG/ACT AEPB Inhale 1 Inhaler into the lungs 2 (two) times daily. 01/26/18   Milton Ferguson, MD  ipratropium (  ATROVENT HFA) 17 MCG/ACT inhaler Inhale 2 puffs into the lungs every 6 (six) hours as needed for wheezing. Patient not taking: Reported on 11/23/2017 11/07/17   Hayden Rasmussen, MD  ondansetron (ZOFRAN ODT) 4 MG disintegrating tablet 4mg  ODT q4 hours prn nausea/vomit Patient not taking: Reported on 01/26/2018 01/06/18   Milton Ferguson, MD  predniSONE (DELTASONE) 20 MG tablet Take 3 po QD x 3d , then 2 po QD x 3d then 1 po QD x 3d 02/15/18   Rolland Porter, MD  ranitidine (ZANTAC) 150 MG tablet Take 150-300 mg by mouth daily as needed for heartburn.     [provider]  senna (SENOKOT) 8.6 MG tablet Take 1-2 tablets by mouth daily as needed for constipation.    [provider]  ALBUTEROL IN Inhale 2 puffs into the lungs as needed. For shortness of breath   10/24/11  [provider]     Family History Family History  Problem Relation Age of Onset  . Diabetes Maternal Grandfather   . Heart attack Maternal Grandfather     Social History Social History   Tobacco Use  . Smoking status: Former Smoker    Packs/day: 0.50    Years: 22.00    Pack years: 11.00    Types: Cigarettes    Last attempt to quit: 02/18/2017    Years since quitting: 0.9  . Smokeless tobacco: Never Used  Substance Use Topics  . Alcohol use: Yes    Comment: occasionally  . Drug use: No  unemployed Lives with boyfriend    Allergies   Patient has no known allergies.   Review of Systems Review of Systems  All other systems reviewed and are negative.    Physical Exam Updated Vital Signs BP 112/77   Pulse 91   Temp 98.1 F (36.7 C) (Oral)   Resp 18   Ht 5\' 5"  (1.651 m)   Wt 136.1 kg (300 lb)   LMP 02/01/2018   SpO2 98%   BMI 49.92 kg/m   Vital signs normal    Physical Exam  Constitutional: She is oriented to person, place, and time. She appears well-developed and well-nourished.  Non-toxic appearance. She does not appear ill. No distress.  Obese, patient had had one nebulizer treatment prior to my exam  HENT:  Head: Normocephalic and atraumatic.  Right Ear: External ear normal.  Left Ear: External ear normal.  Nose: Nose normal. No mucosal edema or rhinorrhea.  Mouth/Throat: Oropharynx is clear and moist and mucous membranes are normal. No dental abscesses or uvula swelling.  Eyes: Pupils are equal, round, and reactive to light. Conjunctivae and EOM are normal.  Neck: Normal range of motion and full passive range of motion without pain. Neck supple.  Cardiovascular: Normal rate, regular rhythm and normal heart sounds. Exam reveals no gallop and no friction rub.  No murmur heard. Pulmonary/Chest: Effort normal. No respiratory distress. She has decreased breath sounds. She has wheezes. She has no rhonchi. She has no rales. She exhibits no tenderness and no crepitus.   Abdominal: Soft. Normal appearance and bowel sounds are normal. She exhibits no distension. There is no tenderness. There is no rebound and no guarding.  Musculoskeletal: Normal range of motion. She exhibits no edema or tenderness.  Moves all extremities well.   Neurological: She is alert and oriented to person, place, and time. She has normal strength. No cranial nerve deficit.  Skin: Skin is warm, dry and intact. No rash noted. No erythema. No pallor.  Psychiatric: She has a normal mood and affect. Her speech is normal and behavior is normal. Her mood appears not anxious.  Nursing note and vitals reviewed.    ED Treatments / Results  Labs (all labs ordered are listed, but only abnormal results are displayed) Labs Reviewed - No data to display  EKG None  Radiology Dg Chest 2 View  Result Date: 02/15/2018 CLINICAL DATA:  Cough and dyspnea x2 days. EXAM: CHEST - 2 VIEW COMPARISON:  01/06/2018 FINDINGS: Heart and mediastinal contours are stable. Bibasilar atelectasis and chronic mild bronchitic change of the lungs characterized by mild increase in interstitial lung markings with slight peribronchial thickening. No alveolar consolidation nor pulmonary edema. No pneumothorax. No acute osseous abnormality. Mild degenerative change is again noted along the dorsal spine. IMPRESSION: Mild mild increase in interstitial lung markings with peribronchial thickening consistent chronic mild bronchitic change. Electronically Signed   By: Ashley Royalty M.D.   On: 02/15/2018 02:19     Procedures Procedures (including critical care time)  Medications Ordered in ED Medications  albuterol (PROVENTIL HFA;VENTOLIN HFA) 108 (90 Base) MCG/ACT inhaler 2 puff (has no administration in time range)  albuterol (PROVENTIL) (2.5 MG/3ML) 0.083% nebulizer solution 5 mg (5 mg Nebulization Given 02/15/18 0109)  albuterol (PROVENTIL,VENTOLIN) solution continuous neb (10 mg/hr Nebulization Given 02/15/18 0150)  predniSONE  (DELTASONE) tablet 60 mg (60 mg Oral Given 02/15/18 0139)     Initial Impression / Assessment and Plan / ED Course  I have reviewed the triage vital signs and the nursing notes.  Pertinent labs & imaging results that were available during my care of the patient were reviewed by me and considered in my medical decision making (see chart for details).     Patient was given a continuous nebulizer and started on oral prednisone.  Recheck at 3:40 AM patient states she feels much better.  When I listen to her now she is breathing much deeper with improved air movement and she is no longer wheezing.  I had nursing staff ambulate her and her pulse ox remained 97 to 98% on room air.  Patient feels ready to be discharged.  Final Clinical Impressions(s) / ED Diagnoses   Final diagnoses:  Mild intermittent asthma with exacerbation    ED Discharge Orders        Ordered    predniSONE (DELTASONE) 20 MG tablet     02/15/18 0420      Plan discharge  Rolland Porter, MD, Barbette Or, MD 02/15/18 [phone]

## 2018-02-15 NOTE — Discharge Instructions (Addendum)
Drink plenty of fluids.  Use the inhaler 2 puffs every 4-6 hours as needed for wheezing.  Take the prednisone until gone.  Recheck if you get worse again.

## 2018-03-11 ENCOUNTER — Other Ambulatory Visit: Payer: Self-pay

## 2018-03-11 ENCOUNTER — Emergency Department (HOSPITAL_COMMUNITY)
Admission: EM | Admit: 2018-03-11 | Discharge: 2018-03-11 | Disposition: A | Discharge status: Home / Self Care | Payer: Self-pay | Attending: Emergency Medicine | Admitting: Emergency Medicine

## 2018-03-11 ENCOUNTER — Encounter (HOSPITAL_COMMUNITY): Payer: Self-pay | Admitting: Emergency Medicine

## 2018-03-11 ENCOUNTER — Emergency Department (HOSPITAL_COMMUNITY): Payer: Self-pay

## 2018-03-11 DIAGNOSIS — Z87891 Personal history of nicotine dependence: Secondary | ICD-10-CM | POA: Insufficient documentation

## 2018-03-11 DIAGNOSIS — J4521 Mild intermittent asthma with (acute) exacerbation: Secondary | ICD-10-CM | POA: Insufficient documentation

## 2018-03-11 DIAGNOSIS — Z9049 Acquired absence of other specified parts of digestive tract: Secondary | ICD-10-CM | POA: Insufficient documentation

## 2018-03-11 DIAGNOSIS — Z79899 Other long term (current) drug therapy: Secondary | ICD-10-CM | POA: Insufficient documentation

## 2018-03-11 MED ORDER — ALBUTEROL SULFATE (2.5 MG/3ML) 0.083% IN NEBU
2.5000 mg | INHALATION_SOLUTION | Freq: Once | RESPIRATORY_TRACT | Status: AC
  Administered 2018-03-11: 2.5 mg via RESPIRATORY_TRACT

## 2018-03-11 MED ORDER — ALBUTEROL SULFATE (2.5 MG/3ML) 0.083% IN NEBU
5.0000 mg | INHALATION_SOLUTION | Freq: Once | RESPIRATORY_TRACT | Status: DC
  Filled 2018-03-11: qty 6

## 2018-03-11 MED ORDER — PREDNISONE 50 MG PO TABS
60.0000 mg | ORAL_TABLET | Freq: Once | ORAL | Status: AC
  Administered 2018-03-11: 60 mg via ORAL
  Filled 2018-03-11: qty 1

## 2018-03-11 MED ORDER — IPRATROPIUM-ALBUTEROL 0.5-2.5 (3) MG/3ML IN SOLN
3.0000 mL | Freq: Once | RESPIRATORY_TRACT | Status: AC
  Administered 2018-03-11: 3 mL via RESPIRATORY_TRACT
  Filled 2018-03-11: qty 3

## 2018-03-11 MED ORDER — PREDNISONE 10 MG PO TABS: ORAL_TABLET | ORAL | 0 refills | Status: DC

## 2018-03-11 NOTE — ED Notes (Signed)
Respiratory paged for nebulizer treatment

## 2018-03-11 NOTE — Discharge Instructions (Signed)
Continue using your albuterol nebulizer as directed.  Start the prednisone prescription tomorrow.  Follow-up with 1 of the clinics listed or return to the emergency room for any worsening symptoms.

## 2018-03-11 NOTE — ED Notes (Signed)
Followed up with respiratory about overdue breathing treatment.

## 2018-03-11 NOTE — ED Notes (Signed)
Patient ambulated and maintained oxygen saturation of 96 percent.

## 2018-03-11 NOTE — ED Triage Notes (Signed)
Patient states she is having shortness of breath that started 3 days ago. States nebulizer and inhaler are not effective.

## 2018-03-12 NOTE — ED Provider Notes (Signed)
[facility] EMERGENCY DEPARTMENT Provider Note   CSN: [id number] Arrival date & time: 03/11/18  1442     History   Chief Complaint Chief Complaint  Patient presents with  . Asthma    HPI Linda Ellis is a 42 y.o. female.  HPI   Linda Ellis is a 42 y.o. female who presents to the Emergency Department complaining of wheezing and shortness of breath.  Symptoms began 3 days ago.  Patient has history of asthma and COPD.  Using albuterol inhaler and nebulizer at home without relief.  She describes intermittent cough that is nonproductive.  She states her symptoms are similar to previous asthma exacerbations.  She is denies fever, chills, chest pain, abdominal pain, vomiting and back pain.  She states that a friend is currently staying at her house and she has 2 dogs with her.  Patient believes her symptoms have been exacerbated by the dogs being in her house.   Past Medical History:  Diagnosis Date  . Asthma   . COPD (chronic obstructive pulmonary disease) ([place])   . Multiple gastric ulcers     Patient Active Problem List   Diagnosis Date Noted  . CAP (community acquired pneumonia) 06/24/2017  . Asthma, chronic, unspecified asthma severity, with acute exacerbation 06/24/2017  . Acute asthma exacerbation 06/24/2017    Past Surgical History:  Procedure Laterality Date  . CHOLECYSTECTOMY    . DIAGNOSTIC LAPAROSCOPY WITH REMOVAL OF ECTOPIC PREGNANCY Right 01/24/2017   Procedure: REMOVAL OF ECTOPIC PREGNANCY;  Surgeon: Florian Buff, MD;  Location: [facility];  Service: Gynecology;  Laterality: Right;  . LAPAROSCOPIC UNILATERAL SALPINGECTOMY Right 01/24/2017   Procedure: LAPAROSCOPIC UNILATERAL SALPINGECTOMY;  Surgeon: Florian Buff, MD;  Location: [facility];  Service: Gynecology;  Laterality: Right;     OB History    Gravida  5   Para  4   Term  4   Preterm      AB      Living  4     SAB      TAB      Ectopic      Multiple      Live Births                Home Medications    Prior to Admission medications   Medication Sig Start Date End Date Taking? Authorizing Provider  albuterol (PROVENTIL) (2.5 MG/3ML) 0.083% nebulizer solution Take 3 mLs (2.5 mg total) by nebulization every 6 (six) hours as needed for wheezing or shortness of breath. 01/26/18   Milton Ferguson, MD  APPLE CIDER VINEGAR PO Take 3 capsules by mouth daily.    [provider]  azithromycin (ZITHROMAX) 250 MG tablet 1 tablet daily starting Wednesday evening Patient not taking: Reported on 01/06/2018 11/23/17   Nat Christen, MD  dicyclomine (BENTYL) 20 MG tablet Take one every 8 hours if needed for abdominal pain Patient not taking: Reported on 01/26/2018 01/06/18   Milton Ferguson, MD  Fluticasone-Salmeterol 113-14 MCG/ACT AEPB Inhale 1 Inhaler into the lungs 2 (two) times daily. 01/26/18   Milton Ferguson, MD  ipratropium (ATROVENT HFA) 17 MCG/ACT inhaler Inhale 2 puffs into the lungs every 6 (six) hours as needed for wheezing. Patient not taking: Reported on 11/23/2017 11/07/17   Hayden Rasmussen, MD  ondansetron (ZOFRAN ODT) 4 MG disintegrating tablet 4mg  ODT q4 hours prn nausea/vomit Patient not taking: Reported on 01/26/2018 01/06/18   Milton Ferguson, MD  predniSONE (DELTASONE) 10 MG tablet Take 6  tablets day one, 5 tablets day two, 4 tablets day three, 3 tablets day four, 2 tablets day five, then 1 tablet day six 03/11/18   Chloeanne Poteet, PA-C  ranitidine (ZANTAC) 150 MG tablet Take 150-300 mg by mouth daily as needed for heartburn.     [provider]  senna (SENOKOT) 8.6 MG tablet Take 1-2 tablets by mouth daily as needed for constipation.    [provider]  ALBUTEROL IN Inhale 2 puffs into the lungs as needed. For shortness of breath   10/24/11  [provider]    Family History Family History  Problem Relation Age of Onset  . Diabetes Maternal Grandfather   . Heart attack Maternal Grandfather     Social History Social History   Tobacco  Use  . Smoking status: Former Smoker    Packs/day: 0.50    Years: 22.00    Pack years: 11.00    Types: Cigarettes    Last attempt to quit: 02/18/2017    Years since quitting: 1.0  . Smokeless tobacco: Never Used  Substance Use Topics  . Alcohol use: Yes    Comment: occasionally  . Drug use: No     Allergies   Patient has no known allergies.   Review of Systems Review of Systems  Constitutional: Negative for appetite change, chills and fever.  HENT: Negative for congestion, sore throat and trouble swallowing.   Respiratory: Positive for cough, shortness of breath and wheezing. Negative for chest tightness.   Cardiovascular: Negative for chest pain and leg swelling.  Gastrointestinal: Negative for abdominal pain, nausea and vomiting.  Musculoskeletal: Negative for arthralgias.  Skin: Negative for color change and rash.  Neurological: Negative for dizziness, weakness and numbness.  Hematological: Negative for adenopathy.  All other systems reviewed and are negative.    Physical Exam Updated Vital Signs BP (!) 127/100 (BP Location: Left Arm)   Pulse 92   Temp 98.1 F (36.7 C) (Oral)   Resp 18   Ht 5\' 5"  (1.651 m)   Wt 136.1 kg   LMP 02/21/2018   SpO2 96%   BMI 49.92 kg/m   Physical Exam  Constitutional: She appears well-developed and well-nourished. No distress.  HENT:  Head: Normocephalic and atraumatic.  Right Ear: Tympanic membrane and ear canal normal.  Left Ear: Tympanic membrane and ear canal normal.  Mouth/Throat: Uvula is midline, oropharynx is clear and moist and mucous membranes are normal. No oropharyngeal exudate.  Eyes: Pupils are equal, round, and reactive to light. EOM are normal.  Neck: Normal range of motion, full passive range of motion without pain and phonation normal. Neck supple.  Cardiovascular: Normal rate, regular rhythm and intact distal pulses.  No murmur heard. Pulmonary/Chest: Effort normal. No stridor. No respiratory distress. She  has wheezes. She has no rales. She exhibits no tenderness.  Diminished lung sounds bilaterally with scattered expiratory wheezes present.  Breathing is slightly labored.  Abdominal: Soft. She exhibits no distension.  Musculoskeletal: She exhibits no edema.  Lymphadenopathy:    She has no cervical adenopathy.  Neurological: She is alert. She exhibits normal muscle tone. Coordination normal.  Skin: Skin is warm and dry. Capillary refill takes less than 2 seconds.  Nursing note and vitals reviewed.    ED Treatments / Results  Labs (all labs ordered are listed, but only abnormal results are displayed) Labs Reviewed - No data to display  EKG None  Radiology Dg Chest 2 View  Result Date: 03/11/2018 CLINICAL DATA:  Asthma,  cough EXAM: CHEST - 2 VIEW COMPARISON:  February 15, 2018 FINDINGS: The heart size and mediastinal contours are within normal limits. Both lungs are clear. The visualized skeletal structures are unremarkable. IMPRESSION: No active cardiopulmonary disease. Electronically Signed   By: Abelardo Diesel M.D.   On: 03/11/2018 15:20    Procedures Procedures (including critical care time)  Medications Ordered in ED Medications  ipratropium-albuterol (DUONEB) 0.5-2.5 (3) MG/3ML nebulizer solution 3 mL (3 mLs Nebulization Given 03/11/18 1545)  albuterol (PROVENTIL) (2.5 MG/3ML) 0.083% nebulizer solution 2.5 mg (2.5 mg Nebulization Given 03/11/18 1545)  predniSONE (DELTASONE) tablet 60 mg (60 mg Oral Given 03/11/18 1613)     Initial Impression / Assessment and Plan / ED Course  I have reviewed the triage vital signs and the nursing notes.  Pertinent labs & imaging results that were available during my care of the patient were reviewed by me and considered in my medical decision making (see chart for details).     Patient with history of COPD and asthma.  Symptoms likely related to asthma exacerbation.  Chest x-ray negative for pneumonia.  No tachycardia or tachypnea.  On recheck,  patient is received albuterol/Atrovent neb.  Lung sounds have significantly improved.  Patient is received oral prednisone.  She reports feeling much better.  She is ambulated in the department and maintain O2 sats of 96% on room air.  She has albuterol vials at home along with an MDI.  Agrees to continue to use, prescription for oral steroids and close PCP follow-up.  Return precautions discussed.  Final Clinical Impressions(s) / ED Diagnoses   Final diagnoses:  Mild intermittent asthma with exacerbation    ED Discharge Orders         Ordered    predniSONE (DELTASONE) 10 MG tablet     03/11/18 1623           Alexsis Kathman, [place], PA-C 03/12/18 1741    Isla Pence, MD 03/12/18 1751

## 2018-03-30 ENCOUNTER — Emergency Department (HOSPITAL_COMMUNITY): Payer: Self-pay

## 2018-03-30 ENCOUNTER — Emergency Department (HOSPITAL_COMMUNITY)
Admission: EM | Admit: 2018-03-30 | Discharge: 2018-03-30 | Disposition: A | Discharge status: Home / Self Care | Payer: Self-pay | Attending: Emergency Medicine | Admitting: Emergency Medicine

## 2018-03-30 ENCOUNTER — Encounter (HOSPITAL_COMMUNITY): Payer: Self-pay | Admitting: Emergency Medicine

## 2018-03-30 ENCOUNTER — Other Ambulatory Visit: Payer: Self-pay

## 2018-03-30 DIAGNOSIS — J4551 Severe persistent asthma with (acute) exacerbation: Secondary | ICD-10-CM | POA: Insufficient documentation

## 2018-03-30 DIAGNOSIS — Z79899 Other long term (current) drug therapy: Secondary | ICD-10-CM | POA: Insufficient documentation

## 2018-03-30 DIAGNOSIS — Z87891 Personal history of nicotine dependence: Secondary | ICD-10-CM | POA: Insufficient documentation

## 2018-03-30 MED ORDER — DOXYCYCLINE HYCLATE 100 MG PO CAPS: 100.0000 mg | ORAL_CAPSULE | Freq: Two times a day (BID) | ORAL | 0 refills | Status: DC

## 2018-03-30 MED ORDER — IPRATROPIUM BROMIDE 0.02 % IN SOLN
0.5000 mg | Freq: Once | RESPIRATORY_TRACT | Status: AC
  Administered 2018-03-30: 0.5 mg via RESPIRATORY_TRACT
  Filled 2018-03-30: qty 2.5

## 2018-03-30 MED ORDER — PREDNISONE 10 MG PO TABS: 50.0000 mg | ORAL_TABLET | Freq: Every day | ORAL | 0 refills | Status: DC

## 2018-03-30 MED ORDER — PREDNISONE 50 MG PO TABS
50.0000 mg | ORAL_TABLET | Freq: Once | ORAL | Status: AC
  Administered 2018-03-30: 50 mg via ORAL
  Filled 2018-03-30: qty 1

## 2018-03-30 MED ORDER — ALBUTEROL SULFATE HFA 108 (90 BASE) MCG/ACT IN AERS
1.0000 | INHALATION_SPRAY | Freq: Once | RESPIRATORY_TRACT | Status: AC
  Administered 2018-03-30: 1 via RESPIRATORY_TRACT
  Filled 2018-03-30: qty 6.7

## 2018-03-30 MED ORDER — ALBUTEROL SULFATE (2.5 MG/3ML) 0.083% IN NEBU: 2.5000 mg | INHALATION_SOLUTION | Freq: Four times a day (QID) | RESPIRATORY_TRACT | 1 refills | Status: DC | PRN

## 2018-03-30 MED ORDER — ALBUTEROL SULFATE HFA 108 (90 BASE) MCG/ACT IN AERS: 1.0000 | INHALATION_SPRAY | Freq: Four times a day (QID) | RESPIRATORY_TRACT | 1 refills | Status: DC | PRN

## 2018-03-30 MED ORDER — ALBUTEROL (5 MG/ML) CONTINUOUS INHALATION SOLN
10.0000 mg/h | INHALATION_SOLUTION | Freq: Once | RESPIRATORY_TRACT | Status: AC
  Administered 2018-03-30: 10 mg/h via RESPIRATORY_TRACT
  Filled 2018-03-30: qty 20

## 2018-03-30 MED ORDER — DOXYCYCLINE HYCLATE 100 MG PO TABS
100.0000 mg | ORAL_TABLET | Freq: Once | ORAL | Status: AC
  Administered 2018-03-30: 100 mg via ORAL
  Filled 2018-03-30: qty 1

## 2018-03-30 MED ORDER — ALBUTEROL SULFATE (2.5 MG/3ML) 0.083% IN NEBU
5.0000 mg | INHALATION_SOLUTION | Freq: Once | RESPIRATORY_TRACT | Status: AC
  Administered 2018-03-30: 5 mg via RESPIRATORY_TRACT
  Filled 2018-03-30: qty 6

## 2018-03-30 NOTE — ED Triage Notes (Signed)
Patient has been short of breath x 1 week, taking breathing treatments all day without any relief, last treatment around 11 pm, last night.

## 2018-03-30 NOTE — Discharge Instructions (Addendum)
We saw you in the ER for your asthma related complains. We gave you some breathing treatments in the ER, and seems like your symptoms have improved. Please take albuterol as needed every 4 hours. Please take the medications prescribed. Please refrain from smoking or smoke exposure. Please see a primary care doctor in 1 week. Return to the ER if your symptoms worsen.  

## 2018-03-30 NOTE — ED Provider Notes (Signed)
[facility] EMERGENCY DEPARTMENT Provider Note   CSN: [id number] Arrival date & time: 03/30/18  0053     History   Chief Complaint Chief Complaint  Patient presents with  . Shortness of Breath    HPI Linda Ellis is a 42 y.o. female.  HPI  42 year old female comes in with chief complaint of shortness of breath.  Patient has history of asthma and stomach ulcers.  Patient reports that her current bout of asthma exacerbation started several days ago.  Patient had a friend moving who had a dog, and she suspects that she started having a flareup because of dog for.  Patient had her friend move out and clean the place with bleach, but bleach fumes now have also been causing her to be short of breath.  Patient continues to have wheezing despite taking nebulizers.  Typically she is able to get her symptoms in control, however today she has taken 5 treatments without any significant relief.  Patient has a cough with tan-colored phlegm and she has pleuritic chest pain.  Patient denies any nausea, vomiting, fevers, chills.  She is not smoking over the past few days.  Pt has no hx of PE, DVT and denies any exogenous hormone (testosterone / estrogen) use, long distance travels or surgery in the past 6 weeks, active cancer, recent immobilization.   Past Medical History:  Diagnosis Date  . Asthma   . COPD (chronic obstructive pulmonary disease) ([place])   . Multiple gastric ulcers     Patient Active Problem List   Diagnosis Date Noted  . CAP (community acquired pneumonia) 06/24/2017  . Asthma, chronic, unspecified asthma severity, with acute exacerbation 06/24/2017  . Acute asthma exacerbation 06/24/2017    Past Surgical History:  Procedure Laterality Date  . CHOLECYSTECTOMY    . DIAGNOSTIC LAPAROSCOPY WITH REMOVAL OF ECTOPIC PREGNANCY Right 01/24/2017   Procedure: REMOVAL OF ECTOPIC PREGNANCY;  Surgeon: Florian Buff, MD;  Location: [facility];  Service: Gynecology;  Laterality: Right;    . LAPAROSCOPIC UNILATERAL SALPINGECTOMY Right 01/24/2017   Procedure: LAPAROSCOPIC UNILATERAL SALPINGECTOMY;  Surgeon: Florian Buff, MD;  Location: [facility];  Service: Gynecology;  Laterality: Right;     OB History    Gravida  5   Para  4   Term  4   Preterm      AB      Living  4     SAB      TAB      Ectopic      Multiple      Live Births               Home Medications    Prior to Admission medications   Medication Sig Start Date End Date Taking? Authorizing Provider  Fluticasone-Salmeterol 113-14 MCG/ACT AEPB Inhale 1 Inhaler into the lungs 2 (two) times daily. 01/26/18  Yes Milton Ferguson, MD  ipratropium (ATROVENT HFA) 17 MCG/ACT inhaler Inhale 2 puffs into the lungs every 6 (six) hours as needed for wheezing. 11/07/17  Yes Hayden Rasmussen, MD  ranitidine (ZANTAC) 150 MG tablet Take 150-300 mg by mouth daily as needed for heartburn.    Yes [provider]  senna (SENOKOT) 8.6 MG tablet Take 1-2 tablets by mouth daily as needed for constipation.   Yes [provider]  albuterol (PROVENTIL HFA;VENTOLIN HFA) 108 (90 Base) MCG/ACT inhaler Inhale 1-2 puffs into the lungs every 6 (six) hours as needed for wheezing or shortness of breath. 03/30/18  Varney Biles, MD  albuterol (PROVENTIL) (2.5 MG/3ML) 0.083% nebulizer solution Take 3 mLs (2.5 mg total) by nebulization every 6 (six) hours as needed for wheezing or shortness of breath. 03/30/18   Varney Biles, MD  APPLE CIDER VINEGAR PO Take 3 capsules by mouth daily.    [provider]  azithromycin (ZITHROMAX) 250 MG tablet 1 tablet daily starting Wednesday evening Patient not taking: Reported on 01/06/2018 11/23/17   Nat Christen, MD  dicyclomine (BENTYL) 20 MG tablet Take one every 8 hours if needed for abdominal pain Patient not taking: Reported on 01/26/2018 01/06/18   Milton Ferguson, MD  doxycycline (VIBRAMYCIN) 100 MG capsule Take 1 capsule (100 mg total) by mouth 2 (two) times daily.  03/30/18   Varney Biles, MD  ondansetron (ZOFRAN ODT) 4 MG disintegrating tablet 4mg  ODT q4 hours prn nausea/vomit Patient not taking: Reported on 01/26/2018 01/06/18   Milton Ferguson, MD  predniSONE (DELTASONE) 10 MG tablet Take 5 tablets (50 mg total) by mouth daily. 03/30/18   Varney Biles, MD  ALBUTEROL IN Inhale 2 puffs into the lungs as needed. For shortness of breath   10/24/11  [provider]    Family History Family History  Problem Relation Age of Onset  . Diabetes Maternal Grandfather   . Heart attack Maternal Grandfather     Social History Social History   Tobacco Use  . Smoking status: Former Smoker    Packs/day: 0.50    Years: 22.00    Pack years: 11.00    Types: Cigarettes    Last attempt to quit: 02/18/2017    Years since quitting: 1.1  . Smokeless tobacco: Never Used  Substance Use Topics  . Alcohol use: Yes    Comment: occasionally  . Drug use: No     Allergies   Patient has no known allergies.   Review of Systems Review of Systems  Constitutional: Positive for activity change.  Respiratory: Positive for cough, shortness of breath and wheezing.   Cardiovascular: Positive for chest pain.  Gastrointestinal: Negative for nausea and vomiting.  Allergic/Immunologic: Negative for immunocompromised state.  All other systems reviewed and are negative.    Physical Exam Updated Vital Signs BP 118/75   Pulse 83   Temp 97.9 F (36.6 C) (Oral)   Resp 17   Ht 5\' 5"  (1.651 m)   Wt 136.1 kg   LMP 03/28/2018   SpO2 96%   BMI 49.92 kg/m   Physical Exam  Constitutional: She is oriented to person, place, and time. She appears well-developed.  HENT:  Head: Atraumatic.  Eyes: Pupils are equal, round, and reactive to light. Conjunctivae and EOM are normal.  Neck: Normal range of motion. Neck supple.  Cardiovascular: Normal rate, regular rhythm and normal heart sounds.  Pulmonary/Chest: Effort normal. No accessory muscle usage or stridor. No  respiratory distress. She has wheezes. She has rhonchi. She has no rales.  Abdominal: Soft. Bowel sounds are normal. She exhibits no distension. There is no tenderness.  Neurological: She is alert and oriented to person, place, and time.  Skin: Skin is warm and dry.  Nursing note and vitals reviewed.    ED Treatments / Results  Labs (all labs ordered are listed, but only abnormal results are displayed) Labs Reviewed - No data to display  EKG EKG Interpretation  Date/Time:  Wednesday March 30 2018 01:23:40 EDT Ventricular Rate:  93 PR Interval:    QRS Duration: 102 QT Interval:  370 QTC Calculation: 461 R Axis:  82 Text Interpretation:  Sinus rhythm Low voltage, precordial leads RSR' in V1 or V2, probably normal variant No acute changes No significant change since last tracing Confirmed by Varney Biles [phone]) on 03/30/2018 3:03:07 AM   Radiology Dg Chest 2 View  Result Date: 03/30/2018 CLINICAL DATA:  42 y/o F; intermittent shortness of breath, nonproductive cough, and chest pain with deep inspiration for 1 week. EXAM: CHEST - 2 VIEW COMPARISON:  03/11/2018 chest radiograph FINDINGS: Stable normal cardiac silhouette given projection and technique. Mild increased reticular markings in the lung bases. No consolidation, effusion, or pneumothorax. Bones are unremarkable. IMPRESSION: Mild increased reticular markings in the lung bases may represent bronchitic changes or atypical pneumonia. No consolidation. Electronically Signed   By: Kristine Garbe M.D.   On: 03/30/2018 02:00    Procedures Procedures (including critical care time) CRITICAL CARE Performed by: Kenly Xiao   Total critical care time: 34 minutes  Critical care time was exclusive of separately billable procedures and treating other patients.  Critical care was necessary to treat or prevent imminent or life-threatening deterioration.  Critical care was time spent personally by me on the following  activities: development of treatment plan with patient and/or surrogate as well as nursing, discussions with consultants, evaluation of patient's response to treatment, examination of patient, obtaining history from patient or surrogate, ordering and performing treatments and interventions, ordering and review of laboratory studies, ordering and review of radiographic studies, pulse oximetry and re-evaluation of patient's condition.   Medications Ordered in ED Medications  albuterol (PROVENTIL HFA;VENTOLIN HFA) 108 (90 Base) MCG/ACT inhaler 1 puff (has no administration in time range)  albuterol (PROVENTIL) (2.5 MG/3ML) 0.083% nebulizer solution 5 mg (5 mg Nebulization Given 03/30/18 0148)  albuterol (PROVENTIL,VENTOLIN) solution continuous neb (10 mg/hr Nebulization Given 03/30/18 0308)  ipratropium (ATROVENT) nebulizer solution 0.5 mg (0.5 mg Nebulization Given 03/30/18 0308)     Initial Impression / Assessment and Plan / ED Course  I have reviewed the triage vital signs and the nursing notes.  Pertinent labs & imaging results that were available during my care of the patient were reviewed by me and considered in my medical decision making (see chart for details).  Clinical Course as of Mar 31 455  Wed Mar 30, 2018  0454 Patient reassessed. She is feeling better.  She continues to have wheezing, but she feels dramatically better and is comfortable going home.  Given that patient's symptoms have evolved over the past few days and now she has a new productive cough with tan phlegm, we will start her on doxycycline.  Strict ER return precautions have been discussed, and patient is agreeing with the plan and is comfortable with the workup done and the recommendations from the ER.    [AN]    Clinical Course User Index [AN] Varney Biles, MD    43 year old female comes in with chief complaint of shortness of breath. Patient has history of asthma, and reports that she has been struggling  with her current asthma exacerbation for the last several days.  Her symptoms were worse today and so she decided to come into the ER.  Patient has a productive cough and pleuritic chest pain.  She denies any fevers or chills.  On exam she has coarse rhonchi and diffuse wheezing. She has a tan-colored phlegm with equivocal appearing x-ray.  We will give her multiple nebs and reassess.  If she continues to have coarse rhonchi then we might send her home with antibiotics for community acquired  pneumonia.  It is possible that she has had viral bronchitis that has turned bacterial because of sudden worsening of her symptoms.  Final Clinical Impressions(s) / ED Diagnoses   Final diagnoses:  Severe persistent asthma with acute exacerbation    ED Discharge Orders         Ordered    predniSONE (DELTASONE) 10 MG tablet  Daily     03/30/18 0436    doxycycline (VIBRAMYCIN) 100 MG capsule  2 times daily     03/30/18 0436    albuterol (PROVENTIL) (2.5 MG/3ML) 0.083% nebulizer solution  Every 6 hours PRN     03/30/18 0436    albuterol (PROVENTIL HFA;VENTOLIN HFA) 108 (90 Base) MCG/ACT inhaler  Every 6 hours PRN     03/30/18 0436           Varney Biles, MD 03/30/18 [phone]

## 2018-04-09 ENCOUNTER — Encounter (HOSPITAL_COMMUNITY): Payer: Self-pay

## 2018-04-09 ENCOUNTER — Emergency Department (HOSPITAL_COMMUNITY): Payer: Self-pay

## 2018-04-09 ENCOUNTER — Emergency Department (HOSPITAL_COMMUNITY)
Admission: EM | Admit: 2018-04-09 | Discharge: 2018-04-09 | Disposition: A | Discharge status: Home / Self Care | Payer: Self-pay | Attending: Emergency Medicine | Admitting: Emergency Medicine

## 2018-04-09 ENCOUNTER — Other Ambulatory Visit: Payer: Self-pay

## 2018-04-09 DIAGNOSIS — Z79899 Other long term (current) drug therapy: Secondary | ICD-10-CM | POA: Insufficient documentation

## 2018-04-09 DIAGNOSIS — J4540 Moderate persistent asthma, uncomplicated: Secondary | ICD-10-CM | POA: Insufficient documentation

## 2018-04-09 DIAGNOSIS — Z87891 Personal history of nicotine dependence: Secondary | ICD-10-CM | POA: Insufficient documentation

## 2018-04-09 LAB — BASIC METABOLIC PANEL
ANION GAP: 7 (ref 5–15)
BUN: 10 mg/dL (ref 6–20)
CHLORIDE: 104 mmol/L (ref 98–111)
CO2: 25 mmol/L (ref 22–32)
Calcium: 9.2 mg/dL (ref 8.9–10.3)
Creatinine, Ser: 0.71 mg/dL (ref 0.44–1.00)
GFR calc Af Amer: >60 mL/min (ref >60)
Glucose, Bld: 123 mg/dL — ABNORMAL HIGH (ref 70–99)
POTASSIUM: 4.1 mmol/L (ref 3.5–5.1)
SODIUM: 136 mmol/L (ref 135–145)

## 2018-04-09 LAB — BRAIN NATRIURETIC PEPTIDE: B Natriuretic Peptide: 23 pg/mL (ref 0.0–100.0)

## 2018-04-09 LAB — CBC WITH DIFFERENTIAL/PLATELET
BASOS ABS: 0.1 10*3/uL (ref 0.0–0.1)
Basophils Relative: 0 %
Eosinophils Absolute: 0.3 10*3/uL (ref 0.0–0.7)
Eosinophils Relative: 2 %
HCT: 43.4 % (ref 36.0–46.0)
HEMOGLOBIN: 14.7 g/dL (ref 12.0–15.0)
LYMPHS ABS: 5.2 10*3/uL — AB (ref 0.7–4.0)
LYMPHS PCT: 31 %
MCH: 31 pg (ref 26.0–34.0)
MCHC: 33.9 g/dL (ref 30.0–36.0)
MCV: 91.6 fL (ref 78.0–100.0)
Monocytes Absolute: 1.2 10*3/uL — ABNORMAL HIGH (ref 0.1–1.0)
Monocytes Relative: 7 %
NEUTROS ABS: 9.7 10*3/uL — AB (ref 1.7–7.7)
NEUTROS PCT: 60 %
Platelets: 465 10*3/uL — ABNORMAL HIGH (ref 150–400)
RBC: 4.74 MIL/uL (ref 3.87–5.11)
RDW: 13.6 % (ref 11.5–15.5)
WBC: 16.5 10*3/uL — AB (ref 4.0–10.5)

## 2018-04-09 LAB — D-DIMER, QUANTITATIVE: D-Dimer, Quant: 0.38 ug/mL-FEU (ref 0.00–0.50)

## 2018-04-09 MED ORDER — PREDNISONE 50 MG PO TABS
60.0000 mg | ORAL_TABLET | Freq: Once | ORAL | Status: AC
  Administered 2018-04-09: 60 mg via ORAL
  Filled 2018-04-09: qty 1

## 2018-04-09 MED ORDER — CETIRIZINE HCL 5 MG PO TABS: 5.0000 mg | ORAL_TABLET | Freq: Every day | ORAL | 0 refills | Status: DC

## 2018-04-09 MED ORDER — ALBUTEROL SULFATE (2.5 MG/3ML) 0.083% IN NEBU
2.5000 mg | INHALATION_SOLUTION | Freq: Once | RESPIRATORY_TRACT | Status: AC
  Administered 2018-04-09: 2.5 mg via RESPIRATORY_TRACT

## 2018-04-09 MED ORDER — IPRATROPIUM-ALBUTEROL 0.5-2.5 (3) MG/3ML IN SOLN
6.0000 mL | Freq: Once | RESPIRATORY_TRACT | Status: AC
  Administered 2018-04-09: 6 mL via RESPIRATORY_TRACT
  Filled 2018-04-09: qty 3

## 2018-04-09 MED ORDER — IPRATROPIUM-ALBUTEROL 0.5-2.5 (3) MG/3ML IN SOLN
RESPIRATORY_TRACT | Status: AC
  Filled 2018-04-09: qty 3

## 2018-04-09 MED ORDER — IPRATROPIUM-ALBUTEROL 0.5-2.5 (3) MG/3ML IN SOLN
3.0000 mL | Freq: Once | RESPIRATORY_TRACT | Status: AC
  Administered 2018-04-09: 3 mL via RESPIRATORY_TRACT

## 2018-04-09 MED ORDER — ALBUTEROL SULFATE (2.5 MG/3ML) 0.083% IN NEBU: 2.5000 mg | INHALATION_SOLUTION | Freq: Four times a day (QID) | RESPIRATORY_TRACT | 1 refills | Status: DC | PRN

## 2018-04-09 MED ORDER — METHYLPREDNISOLONE 4 MG PO TBPK: ORAL_TABLET | ORAL | 0 refills | Status: DC

## 2018-04-09 NOTE — ED Notes (Signed)
Pt states she feels much better after breathing treatment. Showing no signs of distress

## 2018-04-09 NOTE — Discharge Instructions (Addendum)
We signed the ER for wheezing and shortness of breath. X-ray again does not show any signs of infection. We will send you home with prescription for nebulizer treatment and steroids.  It is prudent that you follow-up with the pulmonologist this time around for optimal care of your lung disease.  Return to the ER if you start having worsening symptoms, fevers or chills.

## 2018-04-09 NOTE — ED Triage Notes (Signed)
Pt states she was seen here last week for sob, given antibiotics and steroids, was supposed to get prescription for neb solution but states she did not receive it.  Pt c/o some discomfort in her right chest worse with breathing.  Non-productive cough.

## 2018-04-09 NOTE — ED Provider Notes (Signed)
[facility] EMERGENCY DEPARTMENT Provider Note   CSN: [id number] Arrival date & time: 04/09/18  0103     History   Chief Complaint Chief Complaint  Patient presents with  . Shortness of Breath    HPI [name] is a 42 y.o. female.  HPI  42 year old female with history of COPD, asthma comes in with chief complaint of shortness of breath. This is patient's third visit within the last 3 weeks for same complaint.  I saw the patient last time she was in the ER, and patient reports that she was feeling better at the time of discharge but within a couple of days she started noticing shortness of breath again.  Patient has made sure that she has been away from any allergens.  She took the antibiotics and prednisone as prescribed, but over time her breathing is gotten worse and she ran out of her nebulizer treatment.  Patient has a dry cough.  She denies any fevers. No PE risk factors still and there is no history of DVT, PE.  Patient denies any chest pain or history of CHF.  Past Medical History:  Diagnosis Date  . Asthma   . COPD (chronic obstructive pulmonary disease) ([place])   . Multiple gastric ulcers     Patient Active Problem List   Diagnosis Date Noted  . CAP (community acquired pneumonia) 06/24/2017  . Asthma, chronic, unspecified asthma severity, with acute exacerbation 06/24/2017  . Acute asthma exacerbation 06/24/2017    Past Surgical History:  Procedure Laterality Date  . CHOLECYSTECTOMY    . DIAGNOSTIC LAPAROSCOPY WITH REMOVAL OF ECTOPIC PREGNANCY Right 01/24/2017   Procedure: REMOVAL OF ECTOPIC PREGNANCY;  Surgeon: Florian Buff, MD;  Location: [facility];  Service: Gynecology;  Laterality: Right;  . LAPAROSCOPIC UNILATERAL SALPINGECTOMY Right 01/24/2017   Procedure: LAPAROSCOPIC UNILATERAL SALPINGECTOMY;  Surgeon: Florian Buff, MD;  Location: [facility];  Service: Gynecology;  Laterality: Right;     OB History    Gravida  5   Para  4   Term  4   Preterm        AB      Living  4     SAB      TAB      Ectopic      Multiple      Live Births               Home Medications    Prior to Admission medications   Medication Sig Start Date End Date Taking? Authorizing Provider  albuterol (PROVENTIL HFA;VENTOLIN HFA) 108 (90 Base) MCG/ACT inhaler Inhale 1-2 puffs into the lungs every 6 (six) hours as needed for wheezing or shortness of breath. 03/30/18   Varney Biles, MD  albuterol (PROVENTIL) (2.5 MG/3ML) 0.083% nebulizer solution Take 3 mLs (2.5 mg total) by nebulization every 6 (six) hours as needed for wheezing or shortness of breath. 04/09/18   Varney Biles, MD  APPLE CIDER VINEGAR PO Take 3 capsules by mouth daily.    [provider]  azithromycin (ZITHROMAX) 250 MG tablet 1 tablet daily starting Wednesday evening Patient not taking: Reported on 01/06/2018 11/23/17   Nat Christen, MD  dicyclomine (BENTYL) 20 MG tablet Take one every 8 hours if needed for abdominal pain Patient not taking: Reported on 01/26/2018 01/06/18   Milton Ferguson, MD  doxycycline (VIBRAMYCIN) 100 MG capsule Take 1 capsule (100 mg total) by mouth 2 (two) times daily. 03/30/18   Varney Biles, MD  Fluticasone-Salmeterol [phone]  MCG/ACT AEPB Inhale 1 Inhaler into the lungs 2 (two) times daily. 01/26/18   Milton Ferguson, MD  ipratropium (ATROVENT HFA) 17 MCG/ACT inhaler Inhale 2 puffs into the lungs every 6 (six) hours as needed for wheezing. 11/07/17   Hayden Rasmussen, MD  methylPREDNISolone (MEDROL DOSEPAK) 4 MG TBPK tablet As per manufacturer label 04/09/18   Varney Biles, MD  ondansetron (ZOFRAN ODT) 4 MG disintegrating tablet 4mg  ODT q4 hours prn nausea/vomit Patient not taking: Reported on 01/26/2018 01/06/18   Milton Ferguson, MD  predniSONE (DELTASONE) 10 MG tablet Take 5 tablets (50 mg total) by mouth daily. 03/30/18   Varney Biles, MD  ranitidine (ZANTAC) 150 MG tablet Take 150-300 mg by mouth daily as needed for heartburn.     [provider]   senna (SENOKOT) 8.6 MG tablet Take 1-2 tablets by mouth daily as needed for constipation.    [provider]  ALBUTEROL IN Inhale 2 puffs into the lungs as needed. For shortness of breath   10/24/11  [provider]    Family History Family History  Problem Relation Age of Onset  . Diabetes Maternal Grandfather   . Heart attack Maternal Grandfather     Social History Social History   Tobacco Use  . Smoking status: Former Smoker    Packs/day: 0.50    Years: 22.00    Pack years: 11.00    Types: Cigarettes    Last attempt to quit: 02/18/2017    Years since quitting: 1.1  . Smokeless tobacco: Never Used  Substance Use Topics  . Alcohol use: Yes    Comment: occasionally  . Drug use: No     Allergies   Patient has no known allergies.   Review of Systems Review of Systems  Constitutional: Positive for activity change.  Respiratory: Positive for cough and shortness of breath.   Cardiovascular: Negative for chest pain.  Allergic/Immunologic: Negative for immunocompromised state.  Hematological: Does not bruise/bleed easily.  All other systems reviewed and are negative.    Physical Exam Updated Vital Signs BP 127/82   Pulse (!) 104   Temp 98.2 F (36.8 C) (Oral)   Resp 17   Ht 5\' 4"  (1.626 m)   Wt 136.1 kg   LMP 03/28/2018   SpO2 94%   BMI 51.49 kg/m   Physical Exam  Constitutional: She is oriented to person, place, and time. She appears well-developed.  HENT:  Head: Normocephalic and atraumatic.  Eyes: EOM are normal.  Neck: Normal range of motion. Neck supple.  Cardiovascular: Normal rate.  Pulmonary/Chest: Effort normal. She has wheezes in the right upper field, the right middle field, the right lower field, the left upper field, the left middle field and the left lower field. She has no rhonchi. She has no rales.  Abdominal: Bowel sounds are normal.  Neurological: She is alert and oriented to person, place, and time.  Skin: Skin is warm  and dry.  Nursing note and vitals reviewed.    ED Treatments / Results  Labs (all labs ordered are listed, but only abnormal results are displayed) Labs Reviewed  BASIC METABOLIC PANEL - Abnormal; Notable for the following components:      Result Value   Glucose, Bld 123 (*)    All other components within normal limits  CBC WITH DIFFERENTIAL/PLATELET - Abnormal; Notable for the following components:   WBC 16.5 (*)    Platelets 465 (*)    Neutro Abs 9.7 (*)    Lymphs Abs  5.2 (*)    Monocytes Absolute 1.2 (*)    All other components within normal limits  D-DIMER, QUANTITATIVE (NOT AT [facility])  BRAIN NATRIURETIC PEPTIDE    EKG None  Radiology Dg Chest 2 View  Result Date: 04/09/2018 CLINICAL DATA:  Right chest discomfort, worse with breathing. Nonproductive cough. Patient was seen here last week for shortness of breath. EXAM: CHEST - 2 VIEW COMPARISON:  03/30/2018 FINDINGS: The heart size and mediastinal contours are within normal limits. Both lungs are clear. The visualized skeletal structures are unremarkable. IMPRESSION: No active cardiopulmonary disease. Electronically Signed   By: Lucienne Capers M.D.   On: 04/09/2018 03:30    Procedures Procedures (including critical care time)  Medications Ordered in ED Medications  ipratropium-albuterol (DUONEB) 0.5-2.5 (3) MG/3ML nebulizer solution (  Not Given 04/09/18 0333)  predniSONE (DELTASONE) tablet 60 mg (has no administration in time range)  ipratropium-albuterol (DUONEB) 0.5-2.5 (3) MG/3ML nebulizer solution 3 mL (3 mLs Nebulization Given 04/09/18 0120)  albuterol (PROVENTIL) (2.5 MG/3ML) 0.083% nebulizer solution 2.5 mg (2.5 mg Nebulization Given 04/09/18 0120)  ipratropium-albuterol (DUONEB) 0.5-2.5 (3) MG/3ML nebulizer solution 6 mL (6 mLs Nebulization Given 04/09/18 0329)     Initial Impression / Assessment and Plan / ED Course  I have reviewed the triage vital signs and the nursing notes.  Pertinent labs & imaging results  that were available during my care of the patient were reviewed by me and considered in my medical decision making (see chart for details).  Clinical Course as of Apr 09 428  Sat Apr 09, 2018  4132 Results from the ER workup discussed with the patient face to face and all questions answered to the best of my ability.  We will send her home with pulmonologist follow-up and Medrol Dosepak. Patient's wheezing has premature resolved and she is breathing comfortably   [AN]    Clinical Course User Index [AN] Varney Biles, MD    42 year old female comes in with chief complaint of shortness of breath. This is her third visit to the ER for same complaint.  Patient typically responds to antibiotics or prednisone, but soon after her symptoms return.  She just finished a course of doxycycline and prednisone.  Few days after the discharge she started having worsening shortness of breath.  No PE risk factors, clinically no signs of CHF.  This time around we will get a d-dimer along with basic labs. X-ray also has been ordered.    Final Clinical Impressions(s) / ED Diagnoses   Final diagnoses:  Not well controlled moderate persistent asthma    ED Discharge Orders         Ordered    albuterol (PROVENTIL) (2.5 MG/3ML) 0.083% nebulizer solution  Every 6 hours PRN     04/09/18 0425    methylPREDNISolone (MEDROL DOSEPAK) 4 MG TBPK tablet     04/09/18 0425           Varney Biles, MD 04/09/18 [phone]

## 2018-04-23 ENCOUNTER — Emergency Department (HOSPITAL_COMMUNITY)
Admission: EM | Admit: 2018-04-23 | Discharge: 2018-04-23 | Disposition: A | Discharge status: Home / Self Care | Payer: Self-pay | Attending: Emergency Medicine | Admitting: Emergency Medicine

## 2018-04-23 ENCOUNTER — Encounter (HOSPITAL_COMMUNITY): Payer: Self-pay

## 2018-04-23 ENCOUNTER — Other Ambulatory Visit: Payer: Self-pay

## 2018-04-23 DIAGNOSIS — Z79899 Other long term (current) drug therapy: Secondary | ICD-10-CM | POA: Insufficient documentation

## 2018-04-23 DIAGNOSIS — Z87891 Personal history of nicotine dependence: Secondary | ICD-10-CM | POA: Insufficient documentation

## 2018-04-23 DIAGNOSIS — J4541 Moderate persistent asthma with (acute) exacerbation: Secondary | ICD-10-CM | POA: Insufficient documentation

## 2018-04-23 DIAGNOSIS — R51 Headache: Secondary | ICD-10-CM | POA: Insufficient documentation

## 2018-04-23 DIAGNOSIS — R05 Cough: Secondary | ICD-10-CM | POA: Insufficient documentation

## 2018-04-23 MED ORDER — IPRATROPIUM-ALBUTEROL 0.5-2.5 (3) MG/3ML IN SOLN
3.0000 mL | Freq: Once | RESPIRATORY_TRACT | Status: AC
  Administered 2018-04-23: 3 mL via RESPIRATORY_TRACT
  Filled 2018-04-23: qty 3

## 2018-04-23 MED ORDER — PREDNISONE 50 MG PO TABS
60.0000 mg | ORAL_TABLET | Freq: Once | ORAL | Status: AC
  Administered 2018-04-23: 60 mg via ORAL
  Filled 2018-04-23: qty 1

## 2018-04-23 MED ORDER — ALBUTEROL SULFATE (2.5 MG/3ML) 0.083% IN NEBU
5.0000 mg | INHALATION_SOLUTION | Freq: Once | RESPIRATORY_TRACT | Status: AC
  Administered 2018-04-23: 5 mg via RESPIRATORY_TRACT
  Filled 2018-04-23: qty 6

## 2018-04-23 MED ORDER — ALBUTEROL SULFATE HFA 108 (90 BASE) MCG/ACT IN AERS
2.0000 | INHALATION_SPRAY | RESPIRATORY_TRACT | Status: DC | PRN
  Administered 2018-04-23: 2 via RESPIRATORY_TRACT
  Filled 2018-04-23: qty 6.7

## 2018-04-23 MED ORDER — PREDNISONE 20 MG PO TABS: 60.0000 mg | ORAL_TABLET | Freq: Every day | ORAL | 0 refills | Status: DC

## 2018-04-23 NOTE — ED Provider Notes (Signed)
[facility] EMERGENCY DEPARTMENT Provider Note   CSN: [id number] Arrival date & time: 04/23/18  0036     History   Chief Complaint Chief Complaint  Patient presents with  . Shortness of Breath    HPI Linda Ellis is a 42 y.o. female.  The history is provided by the patient.  She has a history of asthma and COPD and comes in with difficulty breathing, coughing, wheezing which started yesterday.  Symptoms started after she had done her nails.  She had used her home nebulizer without any benefit.  She complains of a headache secondary to coughing.  She denies fever or chills.  Cough is productive of a small amount of clear sputum.  She denies chest pain, nausea, vomiting.  Past Medical History:  Diagnosis Date  . Asthma   . COPD (chronic obstructive pulmonary disease) ([place])   . Multiple gastric ulcers     Patient Active Problem List   Diagnosis Date Noted  . CAP (community acquired pneumonia) 06/24/2017  . Asthma, chronic, unspecified asthma severity, with acute exacerbation 06/24/2017  . Acute asthma exacerbation 06/24/2017    Past Surgical History:  Procedure Laterality Date  . CHOLECYSTECTOMY    . DIAGNOSTIC LAPAROSCOPY WITH REMOVAL OF ECTOPIC PREGNANCY Right 01/24/2017   Procedure: REMOVAL OF ECTOPIC PREGNANCY;  Surgeon: Florian Buff, MD;  Location: [facility];  Service: Gynecology;  Laterality: Right;  . LAPAROSCOPIC UNILATERAL SALPINGECTOMY Right 01/24/2017   Procedure: LAPAROSCOPIC UNILATERAL SALPINGECTOMY;  Surgeon: Florian Buff, MD;  Location: [facility];  Service: Gynecology;  Laterality: Right;     OB History    Gravida  5   Para  4   Term  4   Preterm      AB      Living  4     SAB      TAB      Ectopic      Multiple      Live Births               Home Medications    Prior to Admission medications   Medication Sig Start Date End Date Taking? Authorizing Provider  albuterol (PROVENTIL HFA;VENTOLIN HFA) 108 (90 Base) MCG/ACT inhaler  Inhale 1-2 puffs into the lungs every 6 (six) hours as needed for wheezing or shortness of breath. 03/30/18   Varney Biles, MD  albuterol (PROVENTIL) (2.5 MG/3ML) 0.083% nebulizer solution Take 3 mLs (2.5 mg total) by nebulization every 6 (six) hours as needed for wheezing or shortness of breath. 04/09/18   Varney Biles, MD  APPLE CIDER VINEGAR PO Take 3 capsules by mouth daily.    [provider]  azithromycin (ZITHROMAX) 250 MG tablet 1 tablet daily starting Wednesday evening Patient not taking: Reported on 01/06/2018 11/23/17   Nat Christen, MD  cetirizine (ZYRTEC) 5 MG tablet Take 1 tablet (5 mg total) by mouth daily. 04/09/18   Varney Biles, MD  dicyclomine (BENTYL) 20 MG tablet Take one every 8 hours if needed for abdominal pain Patient not taking: Reported on 01/26/2018 01/06/18   Milton Ferguson, MD  doxycycline (VIBRAMYCIN) 100 MG capsule Take 1 capsule (100 mg total) by mouth 2 (two) times daily. 03/30/18   Varney Biles, MD  Fluticasone-Salmeterol 113-14 MCG/ACT AEPB Inhale 1 Inhaler into the lungs 2 (two) times daily. 01/26/18   Milton Ferguson, MD  ipratropium (ATROVENT HFA) 17 MCG/ACT inhaler Inhale 2 puffs into the lungs every 6 (six) hours as needed for wheezing. 11/07/17  Hayden Rasmussen, MD  methylPREDNISolone (MEDROL DOSEPAK) 4 MG TBPK tablet As per manufacturer label 04/09/18   Varney Biles, MD  ondansetron (ZOFRAN ODT) 4 MG disintegrating tablet 4mg  ODT q4 hours prn nausea/vomit Patient not taking: Reported on 01/26/2018 01/06/18   Milton Ferguson, MD  predniSONE (DELTASONE) 10 MG tablet Take 5 tablets (50 mg total) by mouth daily. 03/30/18   Varney Biles, MD  ranitidine (ZANTAC) 150 MG tablet Take 150-300 mg by mouth daily as needed for heartburn.     [provider]  senna (SENOKOT) 8.6 MG tablet Take 1-2 tablets by mouth daily as needed for constipation.    [provider]  ALBUTEROL IN Inhale 2 puffs into the lungs as needed. For shortness of breath    10/24/11  [provider]    Family History Family History  Problem Relation Age of Onset  . Diabetes Maternal Grandfather   . Heart attack Maternal Grandfather     Social History Social History   Tobacco Use  . Smoking status: Former Smoker    Packs/day: 0.50    Years: 22.00    Pack years: 11.00    Types: Cigarettes    Last attempt to quit: 02/18/2017    Years since quitting: 1.1  . Smokeless tobacco: Never Used  Substance Use Topics  . Alcohol use: Yes    Comment: occasionally  . Drug use: No     Allergies   Patient has no known allergies.   Review of Systems Review of Systems  All other systems reviewed and are negative.    Physical Exam Updated Vital Signs BP 114/69 (BP Location: Left Arm)   Pulse (!) 108   Temp 99.4 F (37.4 C) (Oral)   Resp (!) 22   Ht 5\' 4"  (1.626 m)   Wt 136.1 kg   LMP 03/28/2018   SpO2 93%   BMI 51.49 kg/m   Physical Exam  Nursing note and vitals reviewed.  Morbidly obese 42 year old female, resting comfortably and in no acute distress. Vital signs are significant for mildly elevated respiratory rate and heart rate. Oxygen saturation is 93%, which is normal. Head is normocephalic and atraumatic. PERRLA, EOMI. Oropharynx is clear. Neck is nontender and supple without adenopathy or JVD. Back is nontender and there is no CVA tenderness. Lungs have diffuse inspiratory and expiratory wheezes without rales or rhonchi. Chest is nontender. Heart has regular rate and rhythm without murmur. Abdomen is soft, flat, nontender without masses or hepatosplenomegaly and peristalsis is normoactive. Extremities have no cyanosis or edema, full range of motion is present. Skin is warm and dry without rash. Neurologic: Mental status is normal, cranial nerves are intact, there are no motor or sensory deficits.  ED Treatments / Results   Procedures Procedures   Medications Ordered in ED Medications  albuterol (PROVENTIL HFA;VENTOLIN  HFA) 108 (90 Base) MCG/ACT inhaler 2 puff (has no administration in time range)  albuterol (PROVENTIL) (2.5 MG/3ML) 0.083% nebulizer solution 5 mg (5 mg Nebulization Given 04/23/18 0056)  predniSONE (DELTASONE) tablet 60 mg (60 mg Oral Given 04/23/18 0129)  ipratropium-albuterol (DUONEB) 0.5-2.5 (3) MG/3ML nebulizer solution 3 mL (3 mLs Nebulization Given 04/23/18 0206)     Initial Impression / Assessment and Plan / ED Course  I have reviewed the triage vital signs and the nursing notes.  Acute exacerbation of asthma.  Old records are reviewed, and she has multiple ED visits for asthma.  I see no hospital admissions for asthma.  1:51 AM  Feels better, but exam still shows significant wheezing. Will give a second nebulizer treatment.  2:51 AM  She feels much better after second nebulizer treatment.  On exam, there is still faint wheezing.  Patient states that it is not unusual for her to have residual wheezing.  She does state that she has run out of her albuterol inhaler and is requesting one to take home.  This is given.  She states she does have sufficient medication for her nebulizer.  Advised to stop smoking.  Discharged with prescription for prednisone.  Final Clinical Impressions(s) / ED Diagnoses   Final diagnoses:  Moderate persistent asthma with exacerbation    ED Discharge Orders         Ordered    predniSONE (DELTASONE) 20 MG tablet  Daily     04/23/18 0165           Delora Fuel, MD 53/74/82 [phone]

## 2018-04-23 NOTE — ED Triage Notes (Signed)
SOB since yesterday. No inhaler.

## 2018-05-02 ENCOUNTER — Emergency Department (HOSPITAL_COMMUNITY): Payer: Self-pay

## 2018-05-02 ENCOUNTER — Other Ambulatory Visit: Payer: Self-pay

## 2018-05-02 ENCOUNTER — Inpatient Hospital Stay (HOSPITAL_COMMUNITY)
Admission: EM | Admit: 2018-05-02 | Discharge: 2018-05-04 | DRG: 202 | Disposition: A | Discharge status: Home / Self Care | Payer: Self-pay | Attending: Internal Medicine | Admitting: Internal Medicine

## 2018-05-02 ENCOUNTER — Encounter (HOSPITAL_COMMUNITY): Payer: Self-pay | Admitting: Emergency Medicine

## 2018-05-02 DIAGNOSIS — Z79899 Other long term (current) drug therapy: Secondary | ICD-10-CM

## 2018-05-02 DIAGNOSIS — Z87891 Personal history of nicotine dependence: Secondary | ICD-10-CM

## 2018-05-02 DIAGNOSIS — Z7952 Long term (current) use of systemic steroids: Secondary | ICD-10-CM

## 2018-05-02 DIAGNOSIS — K279 Peptic ulcer, site unspecified, unspecified as acute or chronic, without hemorrhage or perforation: Secondary | ICD-10-CM | POA: Diagnosis present

## 2018-05-02 DIAGNOSIS — K029 Dental caries, unspecified: Secondary | ICD-10-CM | POA: Diagnosis present

## 2018-05-02 DIAGNOSIS — J449 Chronic obstructive pulmonary disease, unspecified: Secondary | ICD-10-CM | POA: Diagnosis present

## 2018-05-02 DIAGNOSIS — D72829 Elevated white blood cell count, unspecified: Secondary | ICD-10-CM | POA: Diagnosis present

## 2018-05-02 DIAGNOSIS — R0902 Hypoxemia: Secondary | ICD-10-CM

## 2018-05-02 DIAGNOSIS — J4541 Moderate persistent asthma with (acute) exacerbation: Principal | ICD-10-CM | POA: Diagnosis present

## 2018-05-02 DIAGNOSIS — T380X5A Adverse effect of glucocorticoids and synthetic analogues, initial encounter: Secondary | ICD-10-CM | POA: Diagnosis present

## 2018-05-02 DIAGNOSIS — J9601 Acute respiratory failure with hypoxia: Secondary | ICD-10-CM

## 2018-05-02 DIAGNOSIS — K219 Gastro-esophageal reflux disease without esophagitis: Secondary | ICD-10-CM

## 2018-05-02 DIAGNOSIS — Z7951 Long term (current) use of inhaled steroids: Secondary | ICD-10-CM

## 2018-05-02 DIAGNOSIS — Z23 Encounter for immunization: Secondary | ICD-10-CM

## 2018-05-02 DIAGNOSIS — Z6841 Body Mass Index (BMI) 40.0 and over, adult: Secondary | ICD-10-CM

## 2018-05-02 DIAGNOSIS — J45901 Unspecified asthma with (acute) exacerbation: Secondary | ICD-10-CM | POA: Diagnosis present

## 2018-05-02 LAB — BASIC METABOLIC PANEL
ANION GAP: 9 (ref 5–15)
BUN: 5 mg/dL — AB (ref 6–20)
CALCIUM: 8.5 mg/dL — AB (ref 8.9–10.3)
CO2: 25 mmol/L (ref 22–32)
Chloride: 101 mmol/L (ref 98–111)
Creatinine, Ser: 0.67 mg/dL (ref 0.44–1.00)
GFR calc Af Amer: >60 mL/min (ref >60)
GLUCOSE: 111 mg/dL — AB (ref 70–99)
POTASSIUM: 3.6 mmol/L (ref 3.5–5.1)
Sodium: 135 mmol/L (ref 135–145)

## 2018-05-02 LAB — CBC WITH DIFFERENTIAL/PLATELET
Basophils Absolute: 0 10*3/uL (ref 0.0–0.1)
Basophils Relative: 0 %
EOS PCT: 2 %
Eosinophils Absolute: 0.3 10*3/uL (ref 0.0–0.7)
HEMATOCRIT: 43.2 % (ref 36.0–46.0)
Hemoglobin: 14.3 g/dL (ref 12.0–15.0)
LYMPHS PCT: 26 %
Lymphs Abs: 4.3 10*3/uL — ABNORMAL HIGH (ref 0.7–4.0)
MCH: 31 pg (ref 26.0–34.0)
MCHC: 33.1 g/dL (ref 30.0–36.0)
MCV: 93.7 fL (ref 78.0–100.0)
MONO ABS: 0.9 10*3/uL (ref 0.1–1.0)
MONOS PCT: 6 %
NEUTROS ABS: 10.8 10*3/uL — AB (ref 1.7–7.7)
Neutrophils Relative %: 66 %
Platelets: 438 10*3/uL — ABNORMAL HIGH (ref 150–400)
RBC: 4.61 MIL/uL (ref 3.87–5.11)
RDW: 13.9 % (ref 11.5–15.5)
WBC: 16.3 10*3/uL — ABNORMAL HIGH (ref 4.0–10.5)

## 2018-05-02 MED ORDER — ALBUTEROL SULFATE (2.5 MG/3ML) 0.083% IN NEBU
2.5000 mg | INHALATION_SOLUTION | Freq: Once | RESPIRATORY_TRACT | Status: AC
  Administered 2018-05-02: 2.5 mg via RESPIRATORY_TRACT
  Filled 2018-05-02: qty 3

## 2018-05-02 MED ORDER — ACETAMINOPHEN 650 MG RE SUPP: 650.0000 mg | Freq: Four times a day (QID) | RECTAL | Status: DC | PRN

## 2018-05-02 MED ORDER — PANTOPRAZOLE SODIUM 40 MG PO TBEC
40.0000 mg | DELAYED_RELEASE_TABLET | Freq: Every day | ORAL | Status: DC
  Administered 2018-05-03 – 2018-05-04 (×2): 40 mg via ORAL
  Filled 2018-05-02 (×2): qty 1

## 2018-05-02 MED ORDER — GUAIFENESIN ER 600 MG PO TB12
600.0000 mg | ORAL_TABLET | Freq: Two times a day (BID) | ORAL | Status: DC
  Administered 2018-05-03 – 2018-05-04 (×4): 600 mg via ORAL
  Filled 2018-05-02 (×8): qty 1

## 2018-05-02 MED ORDER — MOMETASONE FURO-FORMOTEROL FUM 100-5 MCG/ACT IN AERO
2.0000 | INHALATION_SPRAY | Freq: Two times a day (BID) | RESPIRATORY_TRACT | Status: DC
  Filled 2018-05-02: qty 8.8

## 2018-05-02 MED ORDER — IPRATROPIUM-ALBUTEROL 0.5-2.5 (3) MG/3ML IN SOLN
3.0000 mL | Freq: Once | RESPIRATORY_TRACT | Status: AC
  Administered 2018-05-02: 3 mL via RESPIRATORY_TRACT

## 2018-05-02 MED ORDER — METHYLPREDNISOLONE SODIUM SUCC 125 MG IJ SOLR
125.0000 mg | Freq: Once | INTRAMUSCULAR | Status: AC
  Administered 2018-05-02: 125 mg via INTRAVENOUS
  Filled 2018-05-02: qty 2

## 2018-05-02 MED ORDER — ONDANSETRON HCL 4 MG/2ML IJ SOLN: 4.0000 mg | Freq: Four times a day (QID) | INTRAMUSCULAR | Status: DC | PRN

## 2018-05-02 MED ORDER — ALBUTEROL SULFATE (2.5 MG/3ML) 0.083% IN NEBU: 5.0000 mg | INHALATION_SOLUTION | Freq: Once | RESPIRATORY_TRACT | Status: DC

## 2018-05-02 MED ORDER — HEPARIN SODIUM (PORCINE) 5000 UNIT/ML IJ SOLN
5000.0000 [IU] | Freq: Three times a day (TID) | INTRAMUSCULAR | Status: DC
  Filled 2018-05-02 (×2): qty 1

## 2018-05-02 MED ORDER — IPRATROPIUM-ALBUTEROL 0.5-2.5 (3) MG/3ML IN SOLN
RESPIRATORY_TRACT | Status: AC
  Filled 2018-05-02: qty 3

## 2018-05-02 MED ORDER — ALBUTEROL (5 MG/ML) CONTINUOUS INHALATION SOLN
10.0000 mg/h | INHALATION_SOLUTION | Freq: Once | RESPIRATORY_TRACT | Status: AC
  Administered 2018-05-02: 10 mg/h via RESPIRATORY_TRACT
  Filled 2018-05-02: qty 20

## 2018-05-02 MED ORDER — METHYLPREDNISOLONE SODIUM SUCC 40 MG IJ SOLR
40.0000 mg | Freq: Three times a day (TID) | INTRAMUSCULAR | Status: DC
  Administered 2018-05-03: 40 mg via INTRAVENOUS
  Filled 2018-05-02: qty 1

## 2018-05-02 MED ORDER — ONDANSETRON HCL 4 MG/2ML IJ SOLN
INTRAMUSCULAR | Status: AC
  Filled 2018-05-02: qty 2

## 2018-05-02 MED ORDER — SODIUM CHLORIDE 0.9 % IV SOLN: 250.0000 mL | INTRAVENOUS | Status: DC | PRN

## 2018-05-02 MED ORDER — IPRATROPIUM-ALBUTEROL 0.5-2.5 (3) MG/3ML IN SOLN
3.0000 mL | Freq: Four times a day (QID) | RESPIRATORY_TRACT | Status: DC
  Administered 2018-05-03 – 2018-05-04 (×4): 3 mL via RESPIRATORY_TRACT
  Filled 2018-05-02 (×5): qty 3

## 2018-05-02 MED ORDER — IPRATROPIUM BROMIDE 0.02 % IN SOLN: 0.5000 mg | Freq: Once | RESPIRATORY_TRACT | Status: DC

## 2018-05-02 MED ORDER — ONDANSETRON HCL 4 MG/2ML IJ SOLN
4.0000 mg | Freq: Once | INTRAMUSCULAR | Status: AC
  Administered 2018-05-02: 4 mg via INTRAVENOUS

## 2018-05-02 MED ORDER — ONDANSETRON HCL 4 MG PO TABS: 4.0000 mg | ORAL_TABLET | Freq: Four times a day (QID) | ORAL | Status: DC | PRN

## 2018-05-02 MED ORDER — LORATADINE 10 MG PO TABS
10.0000 mg | ORAL_TABLET | Freq: Every day | ORAL | Status: DC
  Administered 2018-05-03 – 2018-05-04 (×2): 10 mg via ORAL
  Filled 2018-05-02 (×2): qty 1

## 2018-05-02 MED ORDER — POLYETHYLENE GLYCOL 3350 17 G PO PACK: 17.0000 g | PACK | Freq: Every day | ORAL | Status: DC | PRN

## 2018-05-02 MED ORDER — SODIUM CHLORIDE 0.9% FLUSH
3.0000 mL | Freq: Two times a day (BID) | INTRAVENOUS | Status: DC
  Administered 2018-05-03 – 2018-05-04 (×3): 3 mL via INTRAVENOUS

## 2018-05-02 MED ORDER — SENNA 8.6 MG PO TABS
2.0000 | ORAL_TABLET | Freq: Every day | ORAL | Status: DC
  Filled 2018-05-02 (×4): qty 2

## 2018-05-02 MED ORDER — TRAZODONE HCL 50 MG PO TABS
50.0000 mg | ORAL_TABLET | Freq: Every evening | ORAL | Status: DC | PRN
  Administered 2018-05-04: 50 mg via ORAL
  Filled 2018-05-02: qty 1

## 2018-05-02 MED ORDER — ALBUTEROL SULFATE (2.5 MG/3ML) 0.083% IN NEBU: 2.5000 mg | INHALATION_SOLUTION | RESPIRATORY_TRACT | Status: DC | PRN

## 2018-05-02 MED ORDER — SODIUM CHLORIDE 0.9% FLUSH: 3.0000 mL | INTRAVENOUS | Status: DC | PRN

## 2018-05-02 MED ORDER — ACETAMINOPHEN 325 MG PO TABS
650.0000 mg | ORAL_TABLET | Freq: Four times a day (QID) | ORAL | Status: DC | PRN
  Administered 2018-05-03 – 2018-05-04 (×4): 650 mg via ORAL
  Filled 2018-05-02 (×4): qty 2

## 2018-05-02 NOTE — ED Notes (Signed)
Dr Pickering at bedside 

## 2018-05-02 NOTE — ED Triage Notes (Signed)
Non productive cough, wheezing since last night.

## 2018-05-02 NOTE — H&P (Signed)
Patient Demographics:    Linda Ellis, is a 42 y.o. female  MRN: [id number]   DOB - [birth year]  Admit Date - 05/02/2018  Outpatient Primary MD for the patient is Patient, No Pcp Per   Assessment & Plan:    Active Problems:   Acute asthma exacerbation    1) acute asthma exacerbation--- at baseline patient appears to have at least moderate persistent asthma, Patient has been seen in the ED 8 times since January 06, 2018 for respiratory/asthma symptoms, last ED visit for Asthma was  04/23/2018.  Treat empirically with IV Solu-Medrol, mucolytics, and bronchodilators, supplemental oxygen as needed.  Patient will benefit from long-term control/maintenance medications (inhaled steroids and long-acting bronchodilator combo) and a written out asthma action plan, upon discharge patient would benefit from a peak flow meter as well  2)Prophylaxis--- take Protonix for GI prophylaxis as well as steroids,  3) leukocytosis--- white count is over 16 K suspect steroid-induced leukocytosis due to recent prednisone therapy  With History of - Reviewed by me  Past Medical History:  Diagnosis Date  . Asthma   . COPD (chronic obstructive pulmonary disease) ([place])   . Multiple gastric ulcers       Past Surgical History:  Procedure Laterality Date  . CHOLECYSTECTOMY    . DIAGNOSTIC LAPAROSCOPY WITH REMOVAL OF ECTOPIC PREGNANCY Right 01/24/2017   Procedure: REMOVAL OF ECTOPIC PREGNANCY;  Surgeon: Florian Buff, MD;  Location: [facility];  Service: Gynecology;  Laterality: Right;  . LAPAROSCOPIC UNILATERAL SALPINGECTOMY Right 01/24/2017   Procedure: LAPAROSCOPIC UNILATERAL SALPINGECTOMY;  Surgeon: Florian Buff, MD;  Location: [facility];  Service: Gynecology;  Laterality: Right;      Chief Complaint  Patient presents with  . Asthma      HPI:    Linda Ellis  is a 42 y.o. female Reformed smoker (quit 01/2017) and history of moderate persistent asthma with frequent exacerbations presents to the ED with persistent shortness of breath, cough and wheezing for the last several days.  Patient has been seen in the ED 8 times since January 06, 2018 for respiratory/asthma symptoms, last ED visit for Asthma was  04/23/2018  No Sick contacts at home, no vomiting no diarrhea no chest pains no palpitations no dizziness no pleuritic symptoms  No productive cough, denies itchy eyes or runny nose  Unable to identify particular triggers for her asthma flare up  In ED----patient has leukocytosis (WBC 16 was recently on prednisone), patient is tachypneic and tachycardic    Review of systems:    In addition to the HPI above,   A full Review of  Systems was done, all other systems reviewed are negative except as noted above in HPI , .    Social History:  Reviewed by me    Social History   Tobacco Use  . Smoking status: Former Smoker    Packs/day: 0.50    Years: 22.00    Pack years: 11.00  Types: Cigarettes    Last attempt to quit: 02/18/2017    Years since quitting: 1.2  . Smokeless tobacco: Never Used  Substance Use Topics  . Alcohol use: Yes    Comment: occasionally     Family History :  Reviewed by me   Family History  Problem Relation Age of Onset  . Diabetes Maternal Grandfather   . Heart attack Maternal Grandfather     Home Medications:   Prior to Admission medications   Medication Sig Start Date End Date Taking? Authorizing Provider  albuterol (PROVENTIL HFA;VENTOLIN HFA) 108 (90 Base) MCG/ACT inhaler Inhale 1-2 puffs into the lungs every 6 (six) hours as needed for wheezing or shortness of breath. 03/30/18   Varney Biles, MD  albuterol (PROVENTIL) (2.5 MG/3ML) 0.083% nebulizer solution Take 3 mLs (2.5 mg total) by nebulization every 6 (six) hours as needed for wheezing or shortness of breath. 04/09/18   Varney Biles, MD  APPLE CIDER VINEGAR PO Take 3 capsules by mouth daily.    [provider]  cetirizine (ZYRTEC) 5 MG tablet Take 1 tablet (5 mg total) by mouth daily. 04/09/18   Varney Biles, MD  Fluticasone-Salmeterol 113-14 MCG/ACT AEPB Inhale 1 Inhaler into the lungs 2 (two) times daily. 01/26/18   Milton Ferguson, MD  ipratropium (ATROVENT HFA) 17 MCG/ACT inhaler Inhale 2 puffs into the lungs every 6 (six) hours as needed for wheezing. 11/07/17   Hayden Rasmussen, MD  predniSONE (DELTASONE) 20 MG tablet Take 3 tablets (60 mg total) by mouth daily. 2/54/27   Delora Fuel, MD  ranitidine (ZANTAC) 150 MG tablet Take 150-300 mg by mouth daily as needed for heartburn.     [provider]  senna (SENOKOT) 8.6 MG tablet Take 1-2 tablets by mouth daily as needed for constipation.    [provider]  ALBUTEROL IN Inhale 2 puffs into the lungs as needed. For shortness of breath   10/24/11  [provider]    Allergies:    No Known Allergies   Physical Exam:   Vitals  Blood pressure 112/82, pulse (!) 114, temperature 99.1 F (37.3 C), temperature source Oral, resp. rate (!) 24, height 5\' 5"  (1.651 m), weight 136.1 kg, last menstrual period 04/25/2018, SpO2 94 %, unknown if currently breastfeeding.  Physical Examination: General appearance - alert, mild conversational dyspnea Mental status - alert, oriented to person, place, and time,  Eyes - sclera anicteric Neck - supple, no JVD elevation , Chest -diminished breath sounds bilaterally, scattered wheezes, tachypneic Heart - S1 and S2 normal, regular, tachycardic Abdomen - soft, nontender, nondistended, no masses or organomegaly Neurological - screening mental status exam normal, neck supple without rigidity, cranial nerves II through XII intact, DTR's normal and symmetric Extremities - no pedal edema noted, intact peripheral pulses  Skin - warm, dry   Data Review:    CBC Recent Labs  Lab 05/02/18 2159  WBC  16.3*  HGB 14.3  HCT 43.2  PLT 438*  MCV 93.7  MCH 31.0  MCHC 33.1  RDW 13.9  LYMPHSABS 4.3*  MONOABS 0.9  EOSABS 0.3  BASOSABS 0.0   ---------------------------------------------------------------------------------------------------------------Chemistries  Recent Labs  Lab 05/02/18 2159  NA 135  K 3.6  CL 101  CO2 25  GLUCOSE 111*  BUN 5*  CREATININE 0.67  CALCIUM 8.5*  ------------------------------------------------------------------------------------------------------------------ estimated creatinine clearance is 132.1 mL/min (by C-G formula based on SCr of 0.67 mg/dL). ------------------------------------------------------------------------------------------------------------------ No results for input(s): TSH, T4TOTAL, T3FREE, THYROIDAB in the last 72 hours.  Invalid input(s): FREET3   Coagulation profile No results for input(s): INR, PROTIME in the last 168 hours. ------------------------------------------------------------------------------------------------------------------- No results for input(s): DDIMER in the last 72 hours. -------------------------------------------------------------------------------------------------------------------  Cardiac Enzymes No results for input(s): CKMB, TROPONINI, MYOGLOBIN in the last 168 hours.  Invalid input(s): CK ------------------------------------------------------------------------------------------------------------------    Component Value Date/Time   BNP 23.0 04/09/2018 0118  ---------------------------------------------------------------------------------------------------------------  Urinalysis    Component Value Date/Time   COLORURINE YELLOW 01/06/2018 1230   APPEARANCEUR HAZY (A) 01/06/2018 1230   LABSPEC 1.026 01/06/2018 1230   PHURINE 5.0 01/06/2018 1230   GLUCOSEU NEGATIVE 01/06/2018 1230   HGBUR SMALL (A) 01/06/2018 1230   BILIRUBINUR NEGATIVE 01/06/2018 1230   KETONESUR NEGATIVE 01/06/2018  1230   PROTEINUR NEGATIVE 01/06/2018 1230   UROBILINOGEN 1.0 06/23/2010 1918   NITRITE NEGATIVE 01/06/2018 1230   LEUKOCYTESUR SMALL (A) 01/06/2018 1230   ---------------------------------------------------------------------------------------------------------------  Imaging Results:    Dg Chest 2 View  Result Date: 05/02/2018 CLINICAL DATA:  Cough and chills EXAM: CHEST - 2 VIEW COMPARISON:  04/09/2018 FINDINGS: The heart size and mediastinal contours are within normal limits. Both lungs are clear. The visualized skeletal structures are unremarkable. IMPRESSION: No active cardiopulmonary disease. Electronically Signed   By: Franchot Gallo M.D.   On: 05/02/2018 20:04    Radiological Exams on Admission: Dg Chest 2 View  Result Date: 05/02/2018 CLINICAL DATA:  Cough and chills EXAM: CHEST - 2 VIEW COMPARISON:  04/09/2018 FINDINGS: The heart size and mediastinal contours are within normal limits. Both lungs are clear. The visualized skeletal structures are unremarkable. IMPRESSION: No active cardiopulmonary disease. Electronically Signed   By: Franchot Gallo M.D.   On: 05/02/2018 20:04    DVT Prophylaxis -SCD/Heparin AM Labs Ordered, also please review Full Orders  Family Communication: Admission, patients condition and plan of care including tests being ordered have been discussed with the patient who indicate understanding and agree with the plan   Code Status - Full Code  Likely DC to  Home   Condition   stable  Roxan Hockey M.D on 05/02/2018 at 11:40 PM Pager---[phone] Go to [email or web address] - [email or web address] for contact info  [facility] - Office  [phone]

## 2018-05-02 NOTE — ED Provider Notes (Signed)
[facility] EMERGENCY DEPARTMENT Provider Note   CSN: [id number] Arrival date & time: 05/02/18  1832     History   Chief Complaint Chief Complaint  Patient presents with  . Asthma    HPI Linda Ellis is a 42 y.o. female.  HPI Patient with few day history of shortness of breath and cough.  Worse since last night.  No real sputum production but has had chills and generalized weakness.  No abdominal pain.  History of asthma.  Does not have inhaler but does have albuterol nebulizer at home.  No real relief with the nebulizer.  Does not have a primary care doctor. Past Medical History:  Diagnosis Date  . Asthma   . COPD (chronic obstructive pulmonary disease) ([place])   . Multiple gastric ulcers     Patient Active Problem List   Diagnosis Date Noted  . CAP (community acquired pneumonia) 06/24/2017  . Asthma, chronic, unspecified asthma severity, with acute exacerbation 06/24/2017  . Acute asthma exacerbation 06/24/2017    Past Surgical History:  Procedure Laterality Date  . CHOLECYSTECTOMY    . DIAGNOSTIC LAPAROSCOPY WITH REMOVAL OF ECTOPIC PREGNANCY Right 01/24/2017   Procedure: REMOVAL OF ECTOPIC PREGNANCY;  Surgeon: Florian Buff, MD;  Location: [facility];  Service: Gynecology;  Laterality: Right;  . LAPAROSCOPIC UNILATERAL SALPINGECTOMY Right 01/24/2017   Procedure: LAPAROSCOPIC UNILATERAL SALPINGECTOMY;  Surgeon: Florian Buff, MD;  Location: [facility];  Service: Gynecology;  Laterality: Right;     OB History    Gravida  5   Para  4   Term  4   Preterm      AB      Living  4     SAB      TAB      Ectopic      Multiple      Live Births               Home Medications    Prior to Admission medications   Medication Sig Start Date End Date Taking? Authorizing Provider  albuterol (PROVENTIL HFA;VENTOLIN HFA) 108 (90 Base) MCG/ACT inhaler Inhale 1-2 puffs into the lungs every 6 (six) hours as needed for wheezing or shortness of breath. 03/30/18    Varney Biles, MD  albuterol (PROVENTIL) (2.5 MG/3ML) 0.083% nebulizer solution Take 3 mLs (2.5 mg total) by nebulization every 6 (six) hours as needed for wheezing or shortness of breath. 04/09/18   Varney Biles, MD  APPLE CIDER VINEGAR PO Take 3 capsules by mouth daily.    [provider]  cetirizine (ZYRTEC) 5 MG tablet Take 1 tablet (5 mg total) by mouth daily. 04/09/18   Varney Biles, MD  Fluticasone-Salmeterol 113-14 MCG/ACT AEPB Inhale 1 Inhaler into the lungs 2 (two) times daily. 01/26/18   Milton Ferguson, MD  ipratropium (ATROVENT HFA) 17 MCG/ACT inhaler Inhale 2 puffs into the lungs every 6 (six) hours as needed for wheezing. 11/07/17   Hayden Rasmussen, MD  predniSONE (DELTASONE) 20 MG tablet Take 3 tablets (60 mg total) by mouth daily. 4/40/34   Delora Fuel, MD  ranitidine (ZANTAC) 150 MG tablet Take 150-300 mg by mouth daily as needed for heartburn.     [provider]  senna (SENOKOT) 8.6 MG tablet Take 1-2 tablets by mouth daily as needed for constipation.    [provider]  ALBUTEROL IN Inhale 2 puffs into the lungs as needed. For shortness of breath   10/24/11  [provider]  Family History Family History  Problem Relation Age of Onset  . Diabetes Maternal Grandfather   . Heart attack Maternal Grandfather     Social History Social History   Tobacco Use  . Smoking status: Former Smoker    Packs/day: 0.50    Years: 22.00    Pack years: 11.00    Types: Cigarettes    Last attempt to quit: 02/18/2017    Years since quitting: 1.2  . Smokeless tobacco: Never Used  Substance Use Topics  . Alcohol use: Yes    Comment: occasionally  . Drug use: No     Allergies   Patient has no known allergies.   Review of Systems Review of Systems  Constitutional: Positive for chills. Negative for appetite change.  Respiratory: Positive for cough, shortness of breath and wheezing.   Cardiovascular: Positive for chest pain.    Gastrointestinal: Negative for abdominal distention.  Genitourinary: Negative for flank pain.  Musculoskeletal: Negative for back pain.  Neurological: Negative for weakness.  Hematological: Negative for adenopathy.  Psychiatric/Behavioral: Negative for confusion.     Physical Exam Updated Vital Signs BP 112/82   Pulse (!) 114   Temp 99.1 F (37.3 C) (Oral)   Resp (!) 24   Ht 5\' 5"  (1.651 m)   Wt 136.1 kg   LMP 04/25/2018   SpO2 94%   BMI 49.92 kg/m   Physical Exam  Constitutional: She appears well-developed.  HENT:  Head: Atraumatic.  Neck: Neck supple.  Cardiovascular: Normal rate.  Pulmonary/Chest:  Diffuse wheezes with prolonged expirations.  Abdominal: There is no tenderness.  Musculoskeletal: She exhibits no edema.  Neurological: She is alert.  Skin: Skin is warm. Capillary refill takes less than 2 seconds.  Psychiatric: She has a normal mood and affect.     ED Treatments / Results  Labs (all labs ordered are listed, but only abnormal results are displayed) Labs Reviewed  BASIC METABOLIC PANEL - Abnormal; Notable for the following components:      Result Value   Glucose, Bld 111 (*)    BUN 5 (*)    Calcium 8.5 (*)    All other components within normal limits  CBC WITH DIFFERENTIAL/PLATELET - Abnormal; Notable for the following components:   WBC 16.3 (*)    Platelets 438 (*)    Neutro Abs 10.8 (*)    Lymphs Abs 4.3 (*)    All other components within normal limits    EKG None  Radiology Dg Chest 2 View  Result Date: 05/02/2018 CLINICAL DATA:  Cough and chills EXAM: CHEST - 2 VIEW COMPARISON:  04/09/2018 FINDINGS: The heart size and mediastinal contours are within normal limits. Both lungs are clear. The visualized skeletal structures are unremarkable. IMPRESSION: No active cardiopulmonary disease. Electronically Signed   By: Franchot Gallo M.D.   On: 05/02/2018 20:04    Procedures Procedures (including critical care time)  Medications Ordered  in ED Medications  ipratropium-albuterol (DUONEB) 0.5-2.5 (3) MG/3ML nebulizer solution (  Canceled Entry 05/02/18 1920)  albuterol (PROVENTIL) (2.5 MG/3ML) 0.083% nebulizer solution 2.5 mg (2.5 mg Nebulization Given 05/02/18 1920)  ipratropium-albuterol (DUONEB) 0.5-2.5 (3) MG/3ML nebulizer solution 3 mL (3 mLs Nebulization Given 05/02/18 1920)  ondansetron (ZOFRAN) injection 4 mg (4 mg Intravenous Given 05/02/18 1943)  albuterol (PROVENTIL,VENTOLIN) solution continuous neb (10 mg/hr Nebulization Given 05/02/18 2215)  methylPREDNISolone sodium succinate (SOLU-MEDROL) 125 mg/2 mL injection 125 mg (125 mg Intravenous Given 05/02/18 2205)     Initial Impression / Assessment and Plan /  ED Course  I have reviewed the triage vital signs and the nursing notes.  Pertinent labs & imaging results that were available during my care of the patient were reviewed by me and considered in my medical decision making (see chart for details).     Patient with likely asthma exacerbation.  Has had breathing treatments and hour-long nebulized treatment.  Sats will still be in upper 80s.  I think at this point she would benefit from admission.   CRITICAL CARE Performed by: Davonna Belling Total critical care time: 30 minutes Critical care time was exclusive of separately billable procedures and treating other patients. Critical care was necessary to treat or prevent imminent or life-threatening deterioration. Critical care was time spent personally by me on the following activities: development of treatment plan with patient and/or surrogate as well as nursing, discussions with consultants, evaluation of patient's response to treatment, examination of patient, obtaining history from patient or surrogate, ordering and performing treatments and interventions, ordering and review of laboratory studies, ordering and review of radiographic studies, pulse oximetry and re-evaluation of patient's condition.   Final  Clinical Impressions(s) / ED Diagnoses   Final diagnoses:  Exacerbation of asthma, unspecified asthma severity, unspecified whether persistent  Hypoxia    ED Discharge Orders    None       Davonna Belling, MD 05/02/18 2321

## 2018-05-03 ENCOUNTER — Encounter (HOSPITAL_COMMUNITY): Payer: Self-pay

## 2018-05-03 DIAGNOSIS — Z6841 Body Mass Index (BMI) 40.0 and over, adult: Secondary | ICD-10-CM

## 2018-05-03 DIAGNOSIS — J45901 Unspecified asthma with (acute) exacerbation: Secondary | ICD-10-CM

## 2018-05-03 DIAGNOSIS — J9601 Acute respiratory failure with hypoxia: Secondary | ICD-10-CM

## 2018-05-03 LAB — RESPIRATORY PANEL BY PCR
Adenovirus: NOT DETECTED
Bordetella pertussis: NOT DETECTED
CORONAVIRUS 229E-RVPPCR: NOT DETECTED
CORONAVIRUS HKU1-RVPPCR: NOT DETECTED
CORONAVIRUS OC43-RVPPCR: NOT DETECTED
Chlamydophila pneumoniae: NOT DETECTED
Coronavirus NL63: NOT DETECTED
INFLUENZA B-RVPPCR: NOT DETECTED
Influenza A: NOT DETECTED
METAPNEUMOVIRUS-RVPPCR: NOT DETECTED
MYCOPLASMA PNEUMONIAE-RVPPCR: NOT DETECTED
PARAINFLUENZA VIRUS 1-RVPPCR: NOT DETECTED
PARAINFLUENZA VIRUS 2-RVPPCR: NOT DETECTED
Parainfluenza Virus 3: NOT DETECTED
Parainfluenza Virus 4: NOT DETECTED
RESPIRATORY SYNCYTIAL VIRUS-RVPPCR: NOT DETECTED
Rhinovirus / Enterovirus: NOT DETECTED

## 2018-05-03 MED ORDER — PREDNISONE 20 MG PO TABS
60.0000 mg | ORAL_TABLET | Freq: Every day | ORAL | Status: DC
  Administered 2018-05-04: 60 mg via ORAL
  Filled 2018-05-03: qty 3

## 2018-05-03 MED ORDER — INFLUENZA VAC SPLIT QUAD 0.5 ML IM SUSY
0.5000 mL | PREFILLED_SYRINGE | INTRAMUSCULAR | Status: AC
  Administered 2018-05-04: 0.5 mL via INTRAMUSCULAR
  Filled 2018-05-03: qty 0.5

## 2018-05-03 MED ORDER — BUDESONIDE 0.5 MG/2ML IN SUSP
0.5000 mg | Freq: Two times a day (BID) | RESPIRATORY_TRACT | Status: AC
  Administered 2018-05-03 (×2): 0.5 mg via RESPIRATORY_TRACT
  Filled 2018-05-03 (×2): qty 2

## 2018-05-03 MED ORDER — METHYLPREDNISOLONE SODIUM SUCC 125 MG IJ SOLR
60.0000 mg | Freq: Four times a day (QID) | INTRAMUSCULAR | Status: AC
  Administered 2018-05-03 – 2018-05-04 (×3): 60 mg via INTRAVENOUS
  Filled 2018-05-03 (×3): qty 2

## 2018-05-03 NOTE — Progress Notes (Signed)
PROGRESS NOTE  Linda Ellis MLY:[id number] DOB: 09/18/1975 DOA: 05/02/2018 PCP: Patient, No Pcp Per  Brief History:  42 year old female with a history of asthma, ectopic pregnancy, morbid obesity, and prior tobacco use presenting with 1 day history of shortness of breath that began in the early morning 05/02/2018 with increasing nonproductive cough.  The patient complained of subjective fevers and chills.  She denied any chest pain, nausea vomiting diarrhea, dizziness, hemoptysis.  The patient quit smoking in July 2018 after 15-pack-year history.  The patient has had numerous ED visits for similar presentations.  She has had 8 ED visits since 01/06/2018 with the most recent on 04/23/2018 when she was treated with bronchodilators and sent home with prednisone.  She stated that she felt a little better after prednisone, but her symptoms worsened 24 hours prior to this admission.  She denies any recent sick contacts.  The patient was treated with bronchodilators and IV Solu-Medrol the emergency department and admitted for further treatment.  Assessment/Plan: Acute respiratory failure with hypoxia -noted to have oxygen saturation 88% on RA -weaned to RA -pulmonary hygiene  Acute asthma exacerbation -Continue IV Solu-Medrol -Viral respiratory panel -Continue bronchodilators -Start budesonide  Morbid obesity -BMI 50.63 -Lifestyle modification  Dental caries -She will need to follow-up with the dentist outpatient  Peptic ulcer disease -Continue PPI    Disposition Plan:   Home 05/04/18 if stable Family Communication:   Family at bedside  Consultants:  none  Code Status:  FULL   DVT Prophylaxis:  [place] Heparin    Procedures: As Listed in Progress Note Above  Antibiotics: None    Subjective: Patient is breathing better but she still has some dyspnea with exertion.  She denies any chest pain, nausea or vomiting, diarrhea, abdominal pain.  There is no dysuria hematuria.   There is no fevers chills.  Objective: Vitals:   05/03/18 0000 05/03/18 0100 05/03/18 0157 05/03/18 0530  BP: 114/81 130/84 120/79 (!) 93/57  Pulse: (!) 113 (!) 110 96 66  Resp:   (!) 22 18  Temp:   98.6 F (37 C) 98.2 F (36.8 C)  TempSrc:   Oral Oral  SpO2: (!) 88% 93% 92% 96%  Weight:   (!) 138 kg   Height:        Intake/Output Summary (Last 24 hours) at 05/03/2018 0753 Last data filed at 05/03/2018 8127 Gross per 24 hour  Intake 480 ml  Output -  Net 480 ml   Weight change:  Exam:   General:  Pt is alert, follows commands appropriately, not in acute distress  HEENT: No icterus, No thrush, No neck mass, [place]/AT  Cardiovascular: RRR, S1/S2, no rubs, no gallops  Respiratory: Bibasilar rales.  Mild expiratory wheeze bilateral.  Abdomen: Soft/+BS, non tender, non distended, no guarding  Extremities: Trace nonpitting edema, No lymphangitis, No petechiae, No rashes, no synovitis   Data Reviewed: I have personally reviewed following labs and imaging studies Basic Metabolic Panel: Recent Labs  Lab 05/02/18 2159  NA 135  K 3.6  CL 101  CO2 25  GLUCOSE 111*  BUN 5*  CREATININE 0.67  CALCIUM 8.5*   Liver Function Tests: No results for input(s): AST, ALT, ALKPHOS, BILITOT, PROT, ALBUMIN in the last 168 hours. No results for input(s): LIPASE, AMYLASE in the last 168 hours. No results for input(s): AMMONIA in the last 168 hours. Coagulation Profile: No results for input(s): INR, PROTIME in the last 168 hours.  CBC: Recent Labs  Lab 05/02/18 2159  WBC 16.3*  NEUTROABS 10.8*  HGB 14.3  HCT 43.2  MCV 93.7  PLT 438*   Cardiac Enzymes: No results for input(s): CKTOTAL, CKMB, CKMBINDEX, TROPONINI in the last 168 hours. BNP: Invalid input(s): POCBNP CBG: No results for input(s): GLUCAP in the last 168 hours. HbA1C: No results for input(s): HGBA1C in the last 72 hours. Urine analysis:    Component Value Date/Time   COLORURINE YELLOW 01/06/2018 1230    APPEARANCEUR HAZY (A) 01/06/2018 1230   LABSPEC 1.026 01/06/2018 1230   PHURINE 5.0 01/06/2018 1230   GLUCOSEU NEGATIVE 01/06/2018 1230   HGBUR SMALL (A) 01/06/2018 1230   BILIRUBINUR NEGATIVE 01/06/2018 1230   KETONESUR NEGATIVE 01/06/2018 1230   PROTEINUR NEGATIVE 01/06/2018 1230   UROBILINOGEN 1.0 06/23/2010 1918   NITRITE NEGATIVE 01/06/2018 1230   LEUKOCYTESUR SMALL (A) 01/06/2018 1230   Sepsis Labs: @LABRCNTIP (procalcitonin:4,lacticidven:4) )No results found for this or any previous visit (from the past 240 hour(s)).   Scheduled Meds: . guaiFENesin  600 mg Oral BID  . heparin  5,000 Units Subcutaneous Q8H  . [START ON 05/04/2018] Influenza vac split quadrivalent PF  0.5 mL Intramuscular Tomorrow-1000  . ipratropium-albuterol  3 mL Nebulization Q6H  . loratadine  10 mg Oral Daily  . methylPREDNISolone (SOLU-MEDROL) injection  40 mg Intravenous Q8H  . mometasone-formoterol  2 puff Inhalation BID  . pantoprazole  40 mg Oral Daily  . senna  2 tablet Oral QHS  . sodium chloride flush  3 mL Intravenous Q12H   Continuous Infusions: . sodium chloride      Procedures/Studies: Dg Chest 2 View  Result Date: 05/02/2018 CLINICAL DATA:  Cough and chills EXAM: CHEST - 2 VIEW COMPARISON:  04/09/2018 FINDINGS: The heart size and mediastinal contours are within normal limits. Both lungs are clear. The visualized skeletal structures are unremarkable. IMPRESSION: No active cardiopulmonary disease. Electronically Signed   By: Franchot Gallo M.D.   On: 05/02/2018 20:04   Dg Chest 2 View  Result Date: 04/09/2018 CLINICAL DATA:  Right chest discomfort, worse with breathing. Nonproductive cough. Patient was seen here last week for shortness of breath. EXAM: CHEST - 2 VIEW COMPARISON:  03/30/2018 FINDINGS: The heart size and mediastinal contours are within normal limits. Both lungs are clear. The visualized skeletal structures are unremarkable. IMPRESSION: No active cardiopulmonary disease.  Electronically Signed   By: Lucienne Capers M.D.   On: 04/09/2018 03:30    Orson Eva, DO  [facility] Pager [phone]  If 7PM-7AM, please contact night-coverage [email or web address] Password [email or web address] 05/03/2018, 7:53 AM   LOS: 0 days

## 2018-05-04 DIAGNOSIS — Z6841 Body Mass Index (BMI) 40.0 and over, adult: Secondary | ICD-10-CM

## 2018-05-04 DIAGNOSIS — R0902 Hypoxemia: Secondary | ICD-10-CM

## 2018-05-04 DIAGNOSIS — J4541 Moderate persistent asthma with (acute) exacerbation: Principal | ICD-10-CM

## 2018-05-04 DIAGNOSIS — K219 Gastro-esophageal reflux disease without esophagitis: Secondary | ICD-10-CM

## 2018-05-04 LAB — HIV ANTIBODY (ROUTINE TESTING W REFLEX): HIV SCREEN 4TH GENERATION: NONREACTIVE

## 2018-05-04 MED ORDER — PREDNISONE 20 MG PO TABS: ORAL_TABLET | ORAL | 0 refills | Status: DC

## 2018-05-04 MED ORDER — RANITIDINE HCL 150 MG PO TABS: 150.0000 mg | ORAL_TABLET | Freq: Every day | ORAL | 1 refills | Status: DC

## 2018-05-04 MED ORDER — PANTOPRAZOLE SODIUM 40 MG PO TBEC: 40.0000 mg | DELAYED_RELEASE_TABLET | Freq: Every day | ORAL | 1 refills | Status: DC

## 2018-05-04 MED ORDER — BUDESONIDE-FORMOTEROL FUMARATE 160-4.5 MCG/ACT IN AERO: 2.0000 | INHALATION_SPRAY | Freq: Two times a day (BID) | RESPIRATORY_TRACT | 3 refills | Status: DC

## 2018-05-04 NOTE — Care Management Note (Addendum)
Case Management Note  Patient Details  Name: Linda Ellis MRN: [id number] Date of Birth: 1976/01/23  Subjective/Objective:  Asthma exacerbation. From home, independent. On room air. No PCP or insurance.                 Action/Plan: Provided [facility] flyer and discussed in detail what they do. Patient agrees to call and set up appt today for follow up with primary care and dental care.   Expected Discharge Date:    05/04/2018              Expected Discharge Plan:  Home/Self Care  In-House Referral:     Discharge planning Services  CM Consult, [facility]  Post Acute Care Choice:  NA Choice offered to:  NA  DME Arranged:    DME Agency:     HH Arranged:    HH Agency:     Status of Service:  Completed, signed off  If discussed at [employer], dates discussed:    Additional Comments:  Panagiota Perfetti, Chauncey Reading, RN 05/04/2018, 11:56 AM

## 2018-05-04 NOTE — Discharge Summary (Addendum)
Physician Discharge Summary  Linda Ellis KJZ:[id number] DOB: 20-May-1976 DOA: 05/02/2018  PCP: Patient, No Pcp Per  Admit date: 05/02/2018 Discharge date: 05/04/2018  Time spent: 35 minutes  Recommendations for Outpatient Follow-up:  1. Reassess asthma stability and adjust inhaler regimen as needed. 2. Outpatient follow-up with a pulmonologist to further assess long-term maintenance of her asthma, perform PFTs and also assist with sleep study. 3. Assist/support patient with ongoing obesity problem.   Discharge Diagnoses:  Active Problems:   Acute asthma exacerbation   Morbid obesity with BMI of 50.0-59.9, adult (HCC)   Acute respiratory failure with hypoxemia (HCC)   Hypoxia   Gastroesophageal reflux disease   Discharge Condition: Stable and improved.  Discharge home with instruction to establish care and arrange follow-up with PCP.  Diet recommendation: Low calorie diet.  Filed Weights   05/02/18 1840 05/03/18 0157  Weight: 136.1 kg (!) 138 kg    History of present illness:  42 year old female with a history of asthma, ectopic pregnancy, morbid obesity, and prior tobacco use presenting with 1 day history of shortness of breath that began in the early morning 05/02/2018 with increasing nonproductive cough.  The patient complained of subjective fevers and chills.  She denied any chest pain, nausea vomiting diarrhea, dizziness, hemoptysis.  The patient quit smoking in July 2018 after 15-pack-year history.  The patient has had numerous ED visits for similar presentations.  She has had 8 ED visits since 01/06/2018 with the most recent on 04/23/2018 when she was treated with bronchodilators and sent home with prednisone.  She stated that she felt a little better after prednisone, but her symptoms worsened 24 hours prior to this admission.  She denies any recent sick contacts.  Hospital Course:  1-asthma exacerbation: -Improved and currently no wheezing -Patient treated initially with IV  steroids and nebulizer treatments. -No oxygen supplementation needs appreciated at discharge. -Steroids has been transitioned to oral prednisone and the patient has been discharged home on Symbicort twice a day and as needed albuterol. -She will benefit of outpatient follow-up with pulmonologist for PFTs and further adjustment in her asthma management as needed.  2-acute respiratory failure with hypoxia -In the setting of acute asthma exacerbation -Oxygen saturation on admission was 88% patient was placed on supplemental oxygen and after pulmonary hygiene and acute treatment her oxygen saturation was 95-97% on room air.  3-morbid obesity -Body mass index is 50.63 kg/m. -Low calorie diet and increase physical activity has been discussed with patient Patient was also encouraged to manage portion controlling her diet. -She will benefit of outpatient follow-up at an obesity clinic.  4-GERD -continue PPI and Zantac.  5-dental caries -Patient will benefit of outpatient follow-up with a dentist.   Procedures:  See below for x-ray reports.  Consultations:  None  Discharge Exam: Vitals:   05/04/18 0603 05/04/18 0649  BP: 138/83   Pulse: 78   Resp: 18   Temp: 98.5 F (36.9 C)   SpO2: 95% 97%    General: Obese, afebrile, no chest pain, no nausea, no vomiting.  Good oxygen saturation on room air and able to speak in full sentences. Cardiovascular: S1 and S2, no rubs, no gallops, no murmurs. Respiratory: Improved air movement bilaterally, positive for scattered rhonchi, currently no wheezing appreciated on exam.  No using accessory muscles. Abdomen: Soft, obese, nontender, nondistended, positive bowel sounds. Extremities: No cyanosis, no clubbing. Neurologic exam: cranial nerves grossly intact, no focal deficits appreciated.  Discharge Instructions   Discharge Instructions    Discharge  instructions   Complete by:  As directed    Establish care and arrange follow-up with  primary care provider. Take medications as prescribed Avoid secondhand smoking and follow instructions as discussed for pulmonary hygiene. Follow low calorie diet and increase physical activity.     Allergies as of 05/04/2018   No Known Allergies     Medication List    TAKE these medications   albuterol 108 (90 Base) MCG/ACT inhaler Commonly known as:  PROVENTIL HFA;VENTOLIN HFA Inhale 1-2 puffs into the lungs every 6 (six) hours as needed for wheezing or shortness of breath.   albuterol (2.5 MG/3ML) 0.083% nebulizer solution Commonly known as:  PROVENTIL Take 3 mLs (2.5 mg total) by nebulization every 6 (six) hours as needed for wheezing or shortness of breath.   budesonide-formoterol 160-4.5 MCG/ACT inhaler Commonly known as:  SYMBICORT Inhale 2 puffs into the lungs 2 times daily at 12 noon and 4 pm.   cetirizine 5 MG tablet Commonly known as:  ZYRTEC Take 1 tablet (5 mg total) by mouth daily.   pantoprazole 40 MG tablet Commonly known as:  PROTONIX Take 1 tablet (40 mg total) by mouth daily. Start taking on:  05/05/2018   predniSONE 20 MG tablet Commonly known as:  DELTASONE Take 3 tablets by mouth daily x1 day; then 2 tablets by mouth daily x2 days; then 1 tablet by mouth daily x2 days; then half tablet by mouth daily x3 days and stop prednisone.   ranitidine 150 MG tablet Commonly known as:  ZANTAC Take 1 tablet (150 mg total) by mouth at bedtime. What changed:    how much to take  when to take this  reasons to take this      No Known Allergies    The results of significant diagnostics from this hospitalization (including imaging, microbiology, ancillary and laboratory) are listed below for reference.    Significant Diagnostic Studies: Dg Chest 2 View  Result Date: 05/02/2018 CLINICAL DATA:  Cough and chills EXAM: CHEST - 2 VIEW COMPARISON:  04/09/2018 FINDINGS: The heart size and mediastinal contours are within normal limits. Both lungs are clear. The  visualized skeletal structures are unremarkable. IMPRESSION: No active cardiopulmonary disease. Electronically Signed   By: Franchot Gallo M.D.   On: 05/02/2018 20:04   Dg Chest 2 View  Result Date: 04/09/2018 CLINICAL DATA:  Right chest discomfort, worse with breathing. Nonproductive cough. Patient was seen here last week for shortness of breath. EXAM: CHEST - 2 VIEW COMPARISON:  03/30/2018 FINDINGS: The heart size and mediastinal contours are within normal limits. Both lungs are clear. The visualized skeletal structures are unremarkable. IMPRESSION: No active cardiopulmonary disease. Electronically Signed   By: Lucienne Capers M.D.   On: 04/09/2018 03:30    Microbiology: Recent Results (from the past 240 hour(s))  Respiratory Panel by PCR     Status: None   Collection Time: 05/03/18  9:02 AM  Result Value Ref Range Status   Adenovirus NOT DETECTED NOT DETECTED Final   Coronavirus 229E NOT DETECTED NOT DETECTED Final   Coronavirus HKU1 NOT DETECTED NOT DETECTED Final   Coronavirus NL63 NOT DETECTED NOT DETECTED Final   Coronavirus OC43 NOT DETECTED NOT DETECTED Final   Metapneumovirus NOT DETECTED NOT DETECTED Final   Rhinovirus / Enterovirus NOT DETECTED NOT DETECTED Final   Influenza A NOT DETECTED NOT DETECTED Final   Influenza B NOT DETECTED NOT DETECTED Final   Parainfluenza Virus 1 NOT DETECTED NOT DETECTED Final   Parainfluenza Virus  2 NOT DETECTED NOT DETECTED Final   Parainfluenza Virus 3 NOT DETECTED NOT DETECTED Final   Parainfluenza Virus 4 NOT DETECTED NOT DETECTED Final   Respiratory Syncytial Virus NOT DETECTED NOT DETECTED Final   Bordetella pertussis NOT DETECTED NOT DETECTED Final   Chlamydophila pneumoniae NOT DETECTED NOT DETECTED Final   Mycoplasma pneumoniae NOT DETECTED NOT DETECTED Final    Comment: Performed at [facility], [place] [address], [place], [place] [address]     Labs: Basic Metabolic Panel: Recent Labs  Lab 05/02/18 2159  NA 135  K 3.6   CL 101  CO2 25  GLUCOSE 111*  BUN 5*  CREATININE 0.67  CALCIUM 8.5*   CBC: Recent Labs  Lab 05/02/18 2159  WBC 16.3*  NEUTROABS 10.8*  HGB 14.3  HCT 43.2  MCV 93.7  PLT 438*   BNP (last 3 results) Recent Labs    04/09/18 0118  BNP 23.0    Signed:  Barton Dubois MD.  [facility] 05/04/2018, 1:47 PM

## 2018-05-17 ENCOUNTER — Emergency Department (HOSPITAL_COMMUNITY)
Admission: EM | Admit: 2018-05-17 | Discharge: 2018-05-17 | Disposition: A | Discharge status: Home / Self Care | Payer: Self-pay | Attending: Emergency Medicine | Admitting: Emergency Medicine

## 2018-05-17 ENCOUNTER — Encounter (HOSPITAL_COMMUNITY): Payer: Self-pay

## 2018-05-17 ENCOUNTER — Other Ambulatory Visit: Payer: Self-pay

## 2018-05-17 ENCOUNTER — Emergency Department (HOSPITAL_COMMUNITY): Payer: Self-pay

## 2018-05-17 DIAGNOSIS — Z87891 Personal history of nicotine dependence: Secondary | ICD-10-CM | POA: Insufficient documentation

## 2018-05-17 DIAGNOSIS — J4541 Moderate persistent asthma with (acute) exacerbation: Secondary | ICD-10-CM | POA: Insufficient documentation

## 2018-05-17 DIAGNOSIS — Z79899 Other long term (current) drug therapy: Secondary | ICD-10-CM | POA: Insufficient documentation

## 2018-05-17 MED ORDER — PREDNISONE 50 MG PO TABS
60.0000 mg | ORAL_TABLET | Freq: Once | ORAL | Status: AC
  Administered 2018-05-17: 60 mg via ORAL
  Filled 2018-05-17: qty 1

## 2018-05-17 MED ORDER — IPRATROPIUM-ALBUTEROL 0.5-2.5 (3) MG/3ML IN SOLN
3.0000 mL | Freq: Once | RESPIRATORY_TRACT | Status: AC
  Administered 2018-05-17: 3 mL via RESPIRATORY_TRACT
  Filled 2018-05-17: qty 3

## 2018-05-17 MED ORDER — ALBUTEROL SULFATE HFA 108 (90 BASE) MCG/ACT IN AERS
2.0000 | INHALATION_SPRAY | RESPIRATORY_TRACT | Status: DC
  Administered 2018-05-17: 2 via RESPIRATORY_TRACT
  Filled 2018-05-17: qty 6.7

## 2018-05-17 MED ORDER — PREDNISONE 20 MG PO TABS: 20.0000 mg | ORAL_TABLET | Freq: Every day | ORAL | 0 refills | Status: DC

## 2018-05-17 NOTE — ED Triage Notes (Signed)
Asthma exacerbation that began last night 10/14 around 2100

## 2018-05-17 NOTE — Discharge Instructions (Signed)
Prescription for prednisone.  Use your inhaler and nebulizer machine as needed.  Return if worse.

## 2018-05-17 NOTE — ED Notes (Signed)
Patient given discharge instruction, verbalized understand. Patient ambulatory out of the department.  

## 2018-05-17 NOTE — ED Notes (Signed)
Called RT for duoneb.

## 2018-05-17 NOTE — ED Provider Notes (Signed)
[facility] EMERGENCY DEPARTMENT Provider Note   CSN: [id number] Arrival date & time: 05/17/18  1049     History   Chief Complaint Chief Complaint  Patient presents with  . Asthma    HPI Linda Ellis is a 42 y.o. female.  Patient with a known history of severe asthma presents with an asthma exacerbation.  She has been using her nebulizer treatment at home, but is run out of her MDI.  Severity of symptoms is moderate.  Exertion makes symptoms worse.  No substernal chest pain, fever, sweats, chills, rusty sputum.     Past Medical History:  Diagnosis Date  . Asthma   . COPD (chronic obstructive pulmonary disease) ([place])   . Multiple gastric ulcers     Patient Active Problem List   Diagnosis Date Noted  . Hypoxia   . Gastroesophageal reflux disease   . Morbid obesity with BMI of 50.0-59.9, adult ([place]) 05/03/2018  . Acute respiratory failure with hypoxemia ([place]) 05/03/2018  . CAP (community acquired pneumonia) 06/24/2017  . Asthma, chronic, unspecified asthma severity, with acute exacerbation 06/24/2017  . Acute asthma exacerbation 06/24/2017    Past Surgical History:  Procedure Laterality Date  . CHOLECYSTECTOMY    . DIAGNOSTIC LAPAROSCOPY WITH REMOVAL OF ECTOPIC PREGNANCY Right 01/24/2017   Procedure: REMOVAL OF ECTOPIC PREGNANCY;  Surgeon: Florian Buff, MD;  Location: [facility];  Service: Gynecology;  Laterality: Right;  . LAPAROSCOPIC UNILATERAL SALPINGECTOMY Right 01/24/2017   Procedure: LAPAROSCOPIC UNILATERAL SALPINGECTOMY;  Surgeon: Florian Buff, MD;  Location: [facility];  Service: Gynecology;  Laterality: Right;     OB History    Gravida  5   Para  4   Term  4   Preterm      AB      Living  4     SAB      TAB      Ectopic      Multiple      Live Births               Home Medications    Prior to Admission medications   Medication Sig Start Date End Date Taking? Authorizing Provider  albuterol (PROVENTIL HFA;VENTOLIN HFA) 108 (90  Base) MCG/ACT inhaler Inhale 1-2 puffs into the lungs every 6 (six) hours as needed for wheezing or shortness of breath. 03/30/18  Yes Nanavati, Ankit, MD  albuterol (PROVENTIL) (2.5 MG/3ML) 0.083% nebulizer solution Take 3 mLs (2.5 mg total) by nebulization every 6 (six) hours as needed for wheezing or shortness of breath. 04/09/18  Yes Varney Biles, MD  cetirizine (ZYRTEC) 5 MG tablet Take 1 tablet (5 mg total) by mouth daily. 04/09/18  Yes Varney Biles, MD  guaifenesin (ROBITUSSIN) 100 MG/5ML syrup Take 200 mg by mouth 3 (three) times daily as needed for cough.   Yes [provider]  ranitidine (ZANTAC) 150 MG tablet Take 1 tablet (150 mg total) by mouth at bedtime. 05/04/18  Yes Barton Dubois, MD  predniSONE (DELTASONE) 20 MG tablet Take 1 tablet (20 mg total) by mouth daily with breakfast. 3 tablets for 3 days, 2 tablets for 3 days, 1 tablet for 3 days, 1/2 tablet for 3 days. 05/17/18   Nat Christen, MD  ALBUTEROL IN Inhale 2 puffs into the lungs as needed. For shortness of breath   10/24/11  [provider]    Family History Family History  Problem Relation Age of Onset  . Diabetes Maternal Grandfather   . Heart attack  Maternal Grandfather     Social History Social History   Tobacco Use  . Smoking status: Former Smoker    Packs/day: 0.50    Years: 22.00    Pack years: 11.00    Types: Cigarettes    Last attempt to quit: 02/18/2017    Years since quitting: 1.2  . Smokeless tobacco: Never Used  Substance Use Topics  . Alcohol use: Yes    Comment: occasionally  . Drug use: No     Allergies   Patient has no known allergies.   Review of Systems Review of Systems  All other systems reviewed and are negative.    Physical Exam Updated Vital Signs BP 124/71 (BP Location: Right Arm)   Pulse 97   Temp 98 F (36.7 C) (Oral)   Resp (!) 22   Ht 5\' 5"  (1.651 m)   Wt (!) 138 kg   LMP 04/25/2018   SpO2 100%   BMI 50.63 kg/m   Physical Exam    Constitutional: She is oriented to person, place, and time.  Coughing and wheezing.  HENT:  Head: Normocephalic and atraumatic.  Eyes: Conjunctivae are normal.  Neck: Neck supple.  Cardiovascular: Normal rate and regular rhythm.  Pulmonary/Chest: Effort normal.  Bilateral expiratory wheezes.  Abdominal: Soft. Bowel sounds are normal.  Musculoskeletal: Normal range of motion.  Neurological: She is alert and oriented to person, place, and time.  Skin: Skin is warm and dry.  Psychiatric: She has a normal mood and affect. Her behavior is normal.  Nursing note and vitals reviewed.    ED Treatments / Results  Labs (all labs ordered are listed, but only abnormal results are displayed) Labs Reviewed - No data to display  EKG None  Radiology Dg Chest 2 View  Result Date: 05/17/2018 CLINICAL DATA:  Shortness of breath and productive cough since last night. Cigarette smoker. EXAM: CHEST - 2 VIEW COMPARISON:  PA and lateral chest 05/02/2018 and 04/04/2017. FINDINGS: The lungs are clear. Heart size is normal. No pneumothorax or pleural fluid. No acute or focal bony abnormality. IMPRESSION: No acute disease. Electronically Signed   By: Inge Rise M.D.   On: 05/17/2018 12:09    Procedures Procedures (including critical care time)  Medications Ordered in ED Medications  albuterol (PROVENTIL HFA;VENTOLIN HFA) 108 (90 Base) MCG/ACT inhaler 2 puff (2 puffs Inhalation Given 05/17/18 1212)  ipratropium-albuterol (DUONEB) 0.5-2.5 (3) MG/3ML nebulizer solution 3 mL (3 mLs Nebulization Given 05/17/18 1212)  predniSONE (DELTASONE) tablet 60 mg (60 mg Oral Given 05/17/18 1136)     Initial Impression / Assessment and Plan / ED Course  I have reviewed the triage vital signs and the nursing notes.  Pertinent labs & imaging results that were available during my care of the patient were reviewed by me and considered in my medical decision making (see chart for details).     Patient  presents with asthmatic exacerbation.  Chest x-ray negative.  She responded well to nebulizer treatment and oral prednisone.  Patient was given an albuterol MDI in the ED.  Discharge medication prednisone.  Final Clinical Impressions(s) / ED Diagnoses   Final diagnoses:  Moderate persistent asthma with exacerbation    ED Discharge Orders         Ordered    predniSONE (DELTASONE) 20 MG tablet  Daily with breakfast     05/17/18 1337           Nat Christen, MD 05/17/18 [phone]

## 2018-06-10 ENCOUNTER — Emergency Department (HOSPITAL_COMMUNITY): Payer: Self-pay

## 2018-06-10 ENCOUNTER — Emergency Department (HOSPITAL_COMMUNITY)
Admission: EM | Admit: 2018-06-10 | Discharge: 2018-06-10 | Disposition: A | Discharge status: Home / Self Care | Payer: Self-pay | Attending: Emergency Medicine | Admitting: Emergency Medicine

## 2018-06-10 ENCOUNTER — Encounter (HOSPITAL_COMMUNITY): Payer: Self-pay

## 2018-06-10 ENCOUNTER — Other Ambulatory Visit: Payer: Self-pay

## 2018-06-10 DIAGNOSIS — Z9049 Acquired absence of other specified parts of digestive tract: Secondary | ICD-10-CM | POA: Insufficient documentation

## 2018-06-10 DIAGNOSIS — J449 Chronic obstructive pulmonary disease, unspecified: Secondary | ICD-10-CM | POA: Insufficient documentation

## 2018-06-10 DIAGNOSIS — J4541 Moderate persistent asthma with (acute) exacerbation: Secondary | ICD-10-CM | POA: Insufficient documentation

## 2018-06-10 DIAGNOSIS — Z79899 Other long term (current) drug therapy: Secondary | ICD-10-CM | POA: Insufficient documentation

## 2018-06-10 DIAGNOSIS — Z87891 Personal history of nicotine dependence: Secondary | ICD-10-CM | POA: Insufficient documentation

## 2018-06-10 MED ORDER — IPRATROPIUM-ALBUTEROL 0.5-2.5 (3) MG/3ML IN SOLN
3.0000 mL | Freq: Once | RESPIRATORY_TRACT | Status: AC
  Administered 2018-06-10: 3 mL via RESPIRATORY_TRACT
  Filled 2018-06-10: qty 3

## 2018-06-10 MED ORDER — ALBUTEROL SULFATE HFA 108 (90 BASE) MCG/ACT IN AERS
2.0000 | INHALATION_SPRAY | RESPIRATORY_TRACT | Status: DC
  Administered 2018-06-10 (×2): 2 via RESPIRATORY_TRACT
  Filled 2018-06-10 (×2): qty 6.7

## 2018-06-10 MED ORDER — PREDNISONE 50 MG PO TABS
60.0000 mg | ORAL_TABLET | Freq: Every day | ORAL | Status: DC
  Filled 2018-06-10: qty 1

## 2018-06-10 MED ORDER — ALBUTEROL SULFATE (2.5 MG/3ML) 0.083% IN NEBU
2.5000 mg | INHALATION_SOLUTION | Freq: Once | RESPIRATORY_TRACT | Status: AC
  Administered 2018-06-10: 2.5 mg via RESPIRATORY_TRACT
  Filled 2018-06-10: qty 3

## 2018-06-10 MED ORDER — PREDNISONE 50 MG PO TABS
60.0000 mg | ORAL_TABLET | Freq: Once | ORAL | Status: AC
  Administered 2018-06-10: 60 mg via ORAL

## 2018-06-10 MED ORDER — ALBUTEROL SULFATE (2.5 MG/3ML) 0.083% IN NEBU: 2.5000 mg | INHALATION_SOLUTION | RESPIRATORY_TRACT | 1 refills | Status: DC | PRN

## 2018-06-10 MED ORDER — PREDNISONE 20 MG PO TABS: 40.0000 mg | ORAL_TABLET | Freq: Every day | ORAL | 0 refills | Status: DC

## 2018-06-10 NOTE — ED Provider Notes (Signed)
[facility] EMERGENCY DEPARTMENT Provider Note   CSN: [id number] Arrival date & time: 06/10/18  0025     History   Chief Complaint Chief Complaint  Patient presents with  . Shortness of Breath    HPI Linda Ellis is a 42 y.o. female.  The history is provided by the patient. No language interpreter was used.  Shortness of Breath  This is a new problem. The problem occurs continuously.The current episode started yesterday. The problem has been gradually worsening. Associated symptoms include cough and wheezing. She has tried nothing for the symptoms. She has had no prior hospitalizations. She has had no prior ICU admissions. Associated medical issues include asthma.    Past Medical History:  Diagnosis Date  . Asthma   . COPD (chronic obstructive pulmonary disease) ([place])   . Multiple gastric ulcers     Patient Active Problem List   Diagnosis Date Noted  . Hypoxia   . Gastroesophageal reflux disease   . Morbid obesity with BMI of 50.0-59.9, adult ([place]) 05/03/2018  . Acute respiratory failure with hypoxemia ([place]) 05/03/2018  . CAP (community acquired pneumonia) 06/24/2017  . Asthma, chronic, unspecified asthma severity, with acute exacerbation 06/24/2017  . Acute asthma exacerbation 06/24/2017    Past Surgical History:  Procedure Laterality Date  . CHOLECYSTECTOMY    . DIAGNOSTIC LAPAROSCOPY WITH REMOVAL OF ECTOPIC PREGNANCY Right 01/24/2017   Procedure: REMOVAL OF ECTOPIC PREGNANCY;  Surgeon: Florian Buff, MD;  Location: [facility];  Service: Gynecology;  Laterality: Right;  . LAPAROSCOPIC UNILATERAL SALPINGECTOMY Right 01/24/2017   Procedure: LAPAROSCOPIC UNILATERAL SALPINGECTOMY;  Surgeon: Florian Buff, MD;  Location: [facility];  Service: Gynecology;  Laterality: Right;     OB History    Gravida  5   Para  4   Term  4   Preterm      AB      Living  4     SAB      TAB      Ectopic      Multiple      Live Births               Home Medications      Prior to Admission medications   Medication Sig Start Date End Date Taking? Authorizing Provider  albuterol (PROVENTIL HFA;VENTOLIN HFA) 108 (90 Base) MCG/ACT inhaler Inhale 1-2 puffs into the lungs every 6 (six) hours as needed for wheezing or shortness of breath. 03/30/18   Varney Biles, MD  albuterol (PROVENTIL) (2.5 MG/3ML) 0.083% nebulizer solution Take 3 mLs (2.5 mg total) by nebulization every 6 (six) hours as needed for wheezing or shortness of breath. 04/09/18   Varney Biles, MD  cetirizine (ZYRTEC) 5 MG tablet Take 1 tablet (5 mg total) by mouth daily. 04/09/18   Varney Biles, MD  guaifenesin (ROBITUSSIN) 100 MG/5ML syrup Take 200 mg by mouth 3 (three) times daily as needed for cough.    [provider]  predniSONE (DELTASONE) 20 MG tablet Take 1 tablet (20 mg total) by mouth daily with breakfast. 3 tablets for 3 days, 2 tablets for 3 days, 1 tablet for 3 days, 1/2 tablet for 3 days. 05/17/18   Nat Christen, MD  ranitidine (ZANTAC) 150 MG tablet Take 1 tablet (150 mg total) by mouth at bedtime. 05/04/18   Barton Dubois, MD  ALBUTEROL IN Inhale 2 puffs into the lungs as needed. For shortness of breath   10/24/11  [provider]    Family History  Family History  Problem Relation Age of Onset  . Diabetes Maternal Grandfather   . Heart attack Maternal Grandfather     Social History Social History   Tobacco Use  . Smoking status: Former Smoker    Packs/day: 0.50    Years: 22.00    Pack years: 11.00    Types: Cigarettes    Last attempt to quit: 02/18/2017    Years since quitting: 1.3  . Smokeless tobacco: Never Used  Substance Use Topics  . Alcohol use: Yes    Comment: occasionally  . Drug use: No     Allergies   Patient has no known allergies.   Review of Systems Review of Systems  Respiratory: Positive for cough, shortness of breath and wheezing.   All other systems reviewed and are negative.    Physical Exam Updated Vital Signs BP (!)  113/100 (BP Location: Left Arm)   Pulse (!) 119   Temp 98.2 F (36.8 C) (Oral)   Resp (!) 24   Ht 5\' 5"  (1.651 m)   Wt (!) 137.9 kg   LMP 05/23/2018 (Exact Date)   SpO2 92%   BMI 50.59 kg/m   Physical Exam  Constitutional: She is oriented to person, place, and time. She appears well-developed and well-nourished.  HENT:  Head: Normocephalic.  Eyes: EOM are normal.  Neck: Normal range of motion.  Cardiovascular: Normal rate.  Pulmonary/Chest: Effort normal. She has wheezes.  Abdominal: She exhibits no distension.  Musculoskeletal: Normal range of motion.  Neurological: She is alert and oriented to person, place, and time.  Psychiatric: She has a normal mood and affect.  Nursing note and vitals reviewed.    ED Treatments / Results  Labs (all labs ordered are listed, but only abnormal results are displayed) Labs Reviewed - No data to display  EKG None  Radiology No results found.  Procedures Procedures (including critical care time)  Medications Ordered in ED Medications  ipratropium-albuterol (DUONEB) 0.5-2.5 (3) MG/3ML nebulizer solution 3 mL (has no administration in time range)  albuterol (PROVENTIL) (2.5 MG/3ML) 0.083% nebulizer solution 2.5 mg (has no administration in time range)     Initial Impression / Assessment and Plan / ED Course  I have reviewed the triage vital signs and the nursing notes.  Pertinent labs & imaging results that were available during my care of the patient were reviewed by me and considered in my medical decision making (see chart for details).       Final Clinical Impressions(s) / ED Diagnoses   Final diagnoses:  None    ED Discharge Orders    None       Sidney Ace 85/63/14 [id number]    Delora Fuel, MD 63/78/58 0230

## 2018-06-10 NOTE — Discharge Instructions (Signed)
Use the albuterol inhaler or nebulizer every four hours as needed to treat your wheezing or shortness of breath.

## 2018-06-10 NOTE — ED Notes (Signed)
ED Provider at bedside. 

## 2018-06-10 NOTE — ED Triage Notes (Signed)
Chronic asthma, copd, using inhalers and nebs at home.  Pt c/o back pain, cough.

## 2018-06-22 ENCOUNTER — Other Ambulatory Visit: Payer: Self-pay

## 2018-06-22 ENCOUNTER — Emergency Department (HOSPITAL_COMMUNITY)
Admission: EM | Admit: 2018-06-22 | Discharge: 2018-06-22 | Disposition: A | Discharge status: Home / Self Care | Payer: Self-pay | Attending: Emergency Medicine | Admitting: Emergency Medicine

## 2018-06-22 ENCOUNTER — Encounter (HOSPITAL_COMMUNITY): Payer: Self-pay | Admitting: Emergency Medicine

## 2018-06-22 DIAGNOSIS — Z87891 Personal history of nicotine dependence: Secondary | ICD-10-CM | POA: Insufficient documentation

## 2018-06-22 DIAGNOSIS — J449 Chronic obstructive pulmonary disease, unspecified: Secondary | ICD-10-CM | POA: Insufficient documentation

## 2018-06-22 DIAGNOSIS — J4541 Moderate persistent asthma with (acute) exacerbation: Secondary | ICD-10-CM | POA: Insufficient documentation

## 2018-06-22 DIAGNOSIS — Z79899 Other long term (current) drug therapy: Secondary | ICD-10-CM | POA: Insufficient documentation

## 2018-06-22 LAB — POC URINE PREG, ED: PREG TEST UR: NEGATIVE

## 2018-06-22 MED ORDER — PREDNISONE 10 MG PO TABS
ORAL_TABLET | ORAL | Status: AC
  Filled 2018-06-22: qty 1

## 2018-06-22 MED ORDER — PREDNISONE 50 MG PO TABS
ORAL_TABLET | ORAL | Status: AC
  Filled 2018-06-22: qty 1

## 2018-06-22 MED ORDER — PREDNISONE 20 MG PO TABS: 40.0000 mg | ORAL_TABLET | Freq: Every day | ORAL | 0 refills | Status: DC

## 2018-06-22 MED ORDER — IPRATROPIUM-ALBUTEROL 0.5-2.5 (3) MG/3ML IN SOLN
RESPIRATORY_TRACT | Status: AC
  Filled 2018-06-22: qty 3

## 2018-06-22 MED ORDER — ALBUTEROL SULFATE (2.5 MG/3ML) 0.083% IN NEBU: 2.5000 mg | INHALATION_SOLUTION | RESPIRATORY_TRACT | 1 refills | Status: DC | PRN

## 2018-06-22 MED ORDER — PREDNISONE 50 MG PO TABS
60.0000 mg | ORAL_TABLET | Freq: Once | ORAL | Status: AC
  Administered 2018-06-22: 60 mg via ORAL

## 2018-06-22 MED ORDER — IPRATROPIUM-ALBUTEROL 0.5-2.5 (3) MG/3ML IN SOLN
3.0000 mL | Freq: Once | RESPIRATORY_TRACT | Status: AC
  Administered 2018-06-22: 3 mL via RESPIRATORY_TRACT

## 2018-06-22 MED ORDER — IPRATROPIUM-ALBUTEROL 0.5-2.5 (3) MG/3ML IN SOLN
3.0000 mL | Freq: Once | RESPIRATORY_TRACT | Status: AC
  Administered 2018-06-22: 3 mL via RESPIRATORY_TRACT
  Filled 2018-06-22: qty 3

## 2018-06-22 MED ORDER — ALBUTEROL SULFATE HFA 108 (90 BASE) MCG/ACT IN AERS: 2.0000 | INHALATION_SPRAY | RESPIRATORY_TRACT | Status: DC | PRN

## 2018-06-22 MED ORDER — ALBUTEROL SULFATE HFA 108 (90 BASE) MCG/ACT IN AERS
2.0000 | INHALATION_SPRAY | RESPIRATORY_TRACT | Status: DC | PRN
  Filled 2018-06-22: qty 6.7

## 2018-06-22 NOTE — ED Provider Notes (Signed)
[facility] EMERGENCY DEPARTMENT Provider Note   CSN: [id number] Arrival date & time: 06/22/18  0043     History   Chief Complaint Chief Complaint  Patient presents with  . Shortness of Breath    HPI Linda Ellis is a 42 y.o. female.  The history is provided by the patient.  She has a history of asthma and comes in with difficulty breathing which started this morning.  She states she was fine yesterday.  There is been a slight cough which is productive of a small amount of clear sputum.  She denies fever, chills, sweats.  She has used her home nebulizer solution without any benefit.  Also, she relates that she is 6 days late for her menses.  Last menses was October 17.  She is not using any contraception.  Past Medical History:  Diagnosis Date  . Asthma   . COPD (chronic obstructive pulmonary disease) ([place])   . Multiple gastric ulcers     Patient Active Problem List   Diagnosis Date Noted  . Hypoxia   . Gastroesophageal reflux disease   . Morbid obesity with BMI of 50.0-59.9, adult ([place]) 05/03/2018  . Acute respiratory failure with hypoxemia ([place]) 05/03/2018  . CAP (community acquired pneumonia) 06/24/2017  . Asthma, chronic, unspecified asthma severity, with acute exacerbation 06/24/2017  . Acute asthma exacerbation 06/24/2017    Past Surgical History:  Procedure Laterality Date  . CHOLECYSTECTOMY    . DIAGNOSTIC LAPAROSCOPY WITH REMOVAL OF ECTOPIC PREGNANCY Right 01/24/2017   Procedure: REMOVAL OF ECTOPIC PREGNANCY;  Surgeon: Florian Buff, MD;  Location: [facility];  Service: Gynecology;  Laterality: Right;  . LAPAROSCOPIC UNILATERAL SALPINGECTOMY Right 01/24/2017   Procedure: LAPAROSCOPIC UNILATERAL SALPINGECTOMY;  Surgeon: Florian Buff, MD;  Location: [facility];  Service: Gynecology;  Laterality: Right;     OB History    Gravida  5   Para  4   Term  4   Preterm      AB      Living  4     SAB      TAB      Ectopic      Multiple      Live Births                 Home Medications    Prior to Admission medications   Medication Sig Start Date End Date Taking? Authorizing Provider  albuterol (PROVENTIL HFA;VENTOLIN HFA) 108 (90 Base) MCG/ACT inhaler Inhale 1-2 puffs into the lungs every 6 (six) hours as needed for wheezing or shortness of breath. 03/30/18   Varney Biles, MD  albuterol (PROVENTIL) (2.5 MG/3ML) 0.083% nebulizer solution Take 3 mLs (2.5 mg total) by nebulization every 4 (four) hours as needed for wheezing or shortness of breath. 82/4/23   Delora Fuel, MD  cetirizine (ZYRTEC) 5 MG tablet Take 1 tablet (5 mg total) by mouth daily. 04/09/18   Varney Biles, MD  guaifenesin (ROBITUSSIN) 100 MG/5ML syrup Take 200 mg by mouth 3 (three) times daily as needed for cough.    [provider]  predniSONE (DELTASONE) 20 MG tablet Take 2 tablets (40 mg total) by mouth daily with breakfast. 53/6/14   Delora Fuel, MD  ranitidine (ZANTAC) 150 MG tablet Take 1 tablet (150 mg total) by mouth at bedtime. 05/04/18   Barton Dubois, MD  ALBUTEROL IN Inhale 2 puffs into the lungs as needed. For shortness of breath   10/24/11  [provider]  Family History Family History  Problem Relation Age of Onset  . Diabetes Maternal Grandfather   . Heart attack Maternal Grandfather     Social History Social History   Tobacco Use  . Smoking status: Former Smoker    Packs/day: 0.50    Years: 22.00    Pack years: 11.00    Types: Cigarettes    Last attempt to quit: 02/18/2017    Years since quitting: 1.3  . Smokeless tobacco: Never Used  Substance Use Topics  . Alcohol use: Yes    Comment: occasionally  . Drug use: No     Allergies   Patient has no known allergies.   Review of Systems Review of Systems  All other systems reviewed and are negative.    Physical Exam Updated Vital Signs BP 140/78   Pulse 87   Temp 97.9 F (36.6 C) (Oral)   Resp 19   Ht 5\' 5"  (1.651 m)   Wt (!) 137.9 kg   LMP 05/19/2018    SpO2 96%   BMI 50.59 kg/m   Physical Exam  Nursing note and vitals reviewed.  Morbidly obese 42 year old female, resting comfortably and in no acute distress. Vital signs are normal. Oxygen saturation is 96%, which is normal. Head is normocephalic and atraumatic. PERRLA, EOMI. Oropharynx is clear. Neck is nontender and supple without adenopathy or JVD. Back is nontender and there is no CVA tenderness. Lungs have diminished airflow with diffuse expiratory wheezes.  There are no rales or rhonchi. Chest is nontender. Heart has regular rate and rhythm without murmur. Abdomen is soft, flat, nontender without masses or hepatosplenomegaly and peristalsis is normoactive. Extremities have 1+ edema, full range of motion is present. Skin is warm and dry without rash. Neurologic: Mental status is normal, cranial nerves are intact, there are no motor or sensory deficits.  ED Treatments / Results   Procedures Procedures  Medications Ordered in ED Medications  ipratropium-albuterol (DUONEB) 0.5-2.5 (3) MG/3ML nebulizer solution (  Not Given 06/22/18 0110)  predniSONE (DELTASONE) 10 MG tablet (has no administration in time range)  predniSONE (DELTASONE) 50 MG tablet (has no administration in time range)  albuterol (PROVENTIL HFA;VENTOLIN HFA) 108 (90 Base) MCG/ACT inhaler 2 puff (has no administration in time range)  ipratropium-albuterol (DUONEB) 0.5-2.5 (3) MG/3ML nebulizer solution 3 mL (3 mLs Nebulization Given 06/22/18 0107)  predniSONE (DELTASONE) tablet 60 mg (60 mg Oral Given 06/22/18 0109)  ipratropium-albuterol (DUONEB) 0.5-2.5 (3) MG/3ML nebulizer solution 3 mL (3 mLs Nebulization Given 06/22/18 0215)     Initial Impression / Assessment and Plan / ED Course  I have reviewed the triage vital signs and the nursing notes.  Exacerbation of asthma.  Old records are reviewed, and she has multiple ED visits with similar presentations.  Will check pregnancy test, give nebulizer treatment  with albuterol and ipratropium, and she will be given initial dose of prednisone.  Following nebulizer treatment, patient felt much better, but not back to baseline.  On reexam, there was still mild wheezing.  She is given a second nebulizer treatment with further improvement and patient stated that she was doing well enough to go home.  On reexam, there is minimal wheezing.  She is discharged with an albuterol inhaler and given prescription for solution for her nebulizer and also a short course of prednisone.  Recommended she try to get established with a primary care provider.  With frequent ED visits, I feel she would likely benefit from preventative measures such as inhaled  steroids.  Final Clinical Impressions(s) / ED Diagnoses   Final diagnoses:  Moderate persistent asthma with exacerbation    ED Discharge Orders         Ordered    predniSONE (DELTASONE) 20 MG tablet  Daily with breakfast     06/22/18 0312    albuterol (PROVENTIL) (2.5 MG/3ML) 0.083% nebulizer solution  Every 4 hours PRN     06/22/18 0981           Delora Fuel, MD 19/14/78 [phone]

## 2018-06-22 NOTE — ED Triage Notes (Signed)
Pt c/o SOB that started Tuesday morning. Pt has hx of copd and asthma. Pt took breathing treatment at home.

## 2018-07-24 ENCOUNTER — Emergency Department (HOSPITAL_COMMUNITY): Payer: Self-pay

## 2018-07-24 ENCOUNTER — Encounter (HOSPITAL_COMMUNITY): Payer: Self-pay

## 2018-07-24 ENCOUNTER — Other Ambulatory Visit: Payer: Self-pay

## 2018-07-24 ENCOUNTER — Emergency Department (HOSPITAL_COMMUNITY)
Admission: EM | Admit: 2018-07-24 | Discharge: 2018-07-24 | Disposition: A | Discharge status: Home / Self Care | Payer: Self-pay | Attending: Emergency Medicine | Admitting: Emergency Medicine

## 2018-07-24 DIAGNOSIS — Z87891 Personal history of nicotine dependence: Secondary | ICD-10-CM | POA: Insufficient documentation

## 2018-07-24 DIAGNOSIS — J4541 Moderate persistent asthma with (acute) exacerbation: Secondary | ICD-10-CM | POA: Insufficient documentation

## 2018-07-24 DIAGNOSIS — J449 Chronic obstructive pulmonary disease, unspecified: Secondary | ICD-10-CM | POA: Insufficient documentation

## 2018-07-24 DIAGNOSIS — B349 Viral infection, unspecified: Secondary | ICD-10-CM | POA: Insufficient documentation

## 2018-07-24 DIAGNOSIS — Z79899 Other long term (current) drug therapy: Secondary | ICD-10-CM | POA: Insufficient documentation

## 2018-07-24 LAB — CBC WITH DIFFERENTIAL/PLATELET
Abs Immature Granulocytes: 0.04 10*3/uL (ref 0.00–0.07)
BASOS PCT: 0 %
Basophils Absolute: 0 10*3/uL (ref 0.0–0.1)
EOS ABS: 0.2 10*3/uL (ref 0.0–0.5)
EOS PCT: 1 %
HCT: 41.3 % (ref 36.0–46.0)
HEMOGLOBIN: 13.5 g/dL (ref 12.0–15.0)
Immature Granulocytes: 0 %
Lymphocytes Relative: 9 %
Lymphs Abs: 1.4 10*3/uL (ref 0.7–4.0)
MCH: 30.1 pg (ref 26.0–34.0)
MCHC: 32.7 g/dL (ref 30.0–36.0)
MCV: 92 fL (ref 80.0–100.0)
MONO ABS: 0.8 10*3/uL (ref 0.1–1.0)
Monocytes Relative: 6 %
Neutro Abs: 12.9 10*3/uL — ABNORMAL HIGH (ref 1.7–7.7)
Neutrophils Relative %: 84 %
Platelets: 337 10*3/uL (ref 150–400)
RBC: 4.49 MIL/uL (ref 3.87–5.11)
RDW: 13.3 % (ref 11.5–15.5)
WBC: 15.4 10*3/uL — AB (ref 4.0–10.5)
nRBC: 0 % (ref 0.0–0.2)

## 2018-07-24 LAB — I-STAT BETA HCG BLOOD, ED (MC, WL, AP ONLY): I-stat hCG, quantitative: <5 m[IU]/mL (ref <5)

## 2018-07-24 LAB — BASIC METABOLIC PANEL
Anion gap: 8 (ref 5–15)
BUN: 9 mg/dL (ref 6–20)
CALCIUM: 9.1 mg/dL (ref 8.9–10.3)
CO2: 23 mmol/L (ref 22–32)
CREATININE: 0.67 mg/dL (ref 0.44–1.00)
Chloride: 103 mmol/L (ref 98–111)
GFR calc Af Amer: >60 mL/min (ref >60)
Glucose, Bld: 135 mg/dL — ABNORMAL HIGH (ref 70–99)
Potassium: 3.8 mmol/L (ref 3.5–5.1)
SODIUM: 134 mmol/L — AB (ref 135–145)

## 2018-07-24 LAB — INFLUENZA PANEL BY PCR (TYPE A & B)
Influenza A By PCR: NEGATIVE
Influenza B By PCR: NEGATIVE

## 2018-07-24 LAB — I-STAT CG4 LACTIC ACID, ED: LACTIC ACID, VENOUS: 0.93 mmol/L (ref 0.5–1.9)

## 2018-07-24 MED ORDER — ALBUTEROL SULFATE (2.5 MG/3ML) 0.083% IN NEBU: 2.5000 mg | INHALATION_SOLUTION | Freq: Four times a day (QID) | RESPIRATORY_TRACT | 12 refills | Status: DC | PRN

## 2018-07-24 MED ORDER — ALBUTEROL (5 MG/ML) CONTINUOUS INHALATION SOLN
10.0000 mg/h | INHALATION_SOLUTION | Freq: Once | RESPIRATORY_TRACT | Status: AC
  Administered 2018-07-24: 10 mg/h via RESPIRATORY_TRACT
  Filled 2018-07-24: qty 20

## 2018-07-24 MED ORDER — IPRATROPIUM BROMIDE 0.02 % IN SOLN
0.5000 mg | Freq: Once | RESPIRATORY_TRACT | Status: AC
  Administered 2018-07-24: 0.5 mg via RESPIRATORY_TRACT
  Filled 2018-07-24: qty 2.5

## 2018-07-24 MED ORDER — OSELTAMIVIR PHOSPHATE 75 MG PO CAPS
75.0000 mg | ORAL_CAPSULE | Freq: Once | ORAL | Status: AC
  Administered 2018-07-24: 75 mg via ORAL
  Filled 2018-07-24: qty 1

## 2018-07-24 MED ORDER — PREDNISONE 50 MG PO TABS
60.0000 mg | ORAL_TABLET | Freq: Once | ORAL | Status: AC
  Administered 2018-07-24: 60 mg via ORAL
  Filled 2018-07-24: qty 1

## 2018-07-24 MED ORDER — SODIUM CHLORIDE 0.9 % IV BOLUS (SEPSIS)
1000.0000 mL | Freq: Once | INTRAVENOUS | Status: AC
  Administered 2018-07-24: 1000 mL via INTRAVENOUS

## 2018-07-24 MED ORDER — PREDNISONE 50 MG PO TABS: ORAL_TABLET | ORAL | 0 refills | Status: DC

## 2018-07-24 MED ORDER — ALBUTEROL SULFATE (2.5 MG/3ML) 0.083% IN NEBU
5.0000 mg | INHALATION_SOLUTION | Freq: Once | RESPIRATORY_TRACT | Status: AC
  Administered 2018-07-24: 5 mg via RESPIRATORY_TRACT
  Filled 2018-07-24: qty 6

## 2018-07-24 MED ORDER — KETOROLAC TROMETHAMINE 30 MG/ML IJ SOLN
30.0000 mg | Freq: Once | INTRAMUSCULAR | Status: AC
  Administered 2018-07-24: 30 mg via INTRAVENOUS
  Filled 2018-07-24: qty 1

## 2018-07-24 NOTE — ED Notes (Signed)
Pt ambulated around nurses station. O2 maintained at 91-92% on RA

## 2018-07-24 NOTE — ED Provider Notes (Signed)
[facility] EMERGENCY DEPARTMENT Provider Note   CSN: [id number] Arrival date & time: 07/24/18  0048     History   Chief Complaint Chief Complaint  Patient presents with  . Shortness of Breath    HPI Linda Ellis is a 42 y.o. female.  The history is provided by the patient.  Shortness of Breath  This is a new problem. The average episode lasts 2 days. The problem occurs frequently.The problem has been gradually worsening. Associated symptoms include a fever, ear pain, cough and wheezing. Pertinent negatives include no sore throat, no hemoptysis, no vomiting and no abdominal pain. Treatments tried: NyQuil and albuterol. The treatment provided no relief. Associated medical issues include asthma.  PT With significant history of asthma presents with fever, cough and shortness of breath.  She reports over the past 2 days that she been having increasing cough.  Over the past 24 hours she has had fever up to 102. She reports mild left-sided chest pain with cough.  No vomiting.  No hemoptysis.  She does report she had flu vaccination.  Past Medical History:  Diagnosis Date  . Asthma   . COPD (chronic obstructive pulmonary disease) ([place])   . Multiple gastric ulcers     Patient Active Problem List   Diagnosis Date Noted  . Hypoxia   . Gastroesophageal reflux disease   . Morbid obesity with BMI of 50.0-59.9, adult ([place]) 05/03/2018  . Acute respiratory failure with hypoxemia ([place]) 05/03/2018  . CAP (community acquired pneumonia) 06/24/2017  . Asthma, chronic, unspecified asthma severity, with acute exacerbation 06/24/2017  . Acute asthma exacerbation 06/24/2017    Past Surgical History:  Procedure Laterality Date  . CHOLECYSTECTOMY    . DIAGNOSTIC LAPAROSCOPY WITH REMOVAL OF ECTOPIC PREGNANCY Right 01/24/2017   Procedure: REMOVAL OF ECTOPIC PREGNANCY;  Surgeon: Florian Buff, MD;  Location: [facility];  Service: Gynecology;  Laterality: Right;  . LAPAROSCOPIC UNILATERAL  SALPINGECTOMY Right 01/24/2017   Procedure: LAPAROSCOPIC UNILATERAL SALPINGECTOMY;  Surgeon: Florian Buff, MD;  Location: [facility];  Service: Gynecology;  Laterality: Right;     OB History    Gravida  5   Para  4   Term  4   Preterm      AB      Living  4     SAB      TAB      Ectopic      Multiple      Live Births               Home Medications    Prior to Admission medications   Medication Sig Start Date End Date Taking? Authorizing Provider  albuterol (PROVENTIL HFA;VENTOLIN HFA) 108 (90 Base) MCG/ACT inhaler Inhale 1-2 puffs into the lungs every 6 (six) hours as needed for wheezing or shortness of breath. 03/30/18  Yes Nanavati, Ankit, MD  albuterol (PROVENTIL) (2.5 MG/3ML) 0.083% nebulizer solution Take 3 mLs (2.5 mg total) by nebulization every 4 (four) hours as needed for wheezing or shortness of breath. 80/99/83  Yes Delora Fuel, MD  predniSONE (DELTASONE) 20 MG tablet Take 2 tablets (40 mg total) by mouth daily with breakfast. 38/25/05  Yes Delora Fuel, MD  ranitidine (ZANTAC) 150 MG tablet Take 1 tablet (150 mg total) by mouth at bedtime. 05/04/18  Yes Barton Dubois, MD  cetirizine (ZYRTEC) 5 MG tablet Take 1 tablet (5 mg total) by mouth daily. 04/09/18   Varney Biles, MD  guaifenesin (ROBITUSSIN) 100 MG/5ML syrup Take  200 mg by mouth 3 (three) times daily as needed for cough.    [provider]  ALBUTEROL IN Inhale 2 puffs into the lungs as needed. For shortness of breath   10/24/11  [provider]    Family History Family History  Problem Relation Age of Onset  . Diabetes Maternal Grandfather   . Heart attack Maternal Grandfather     Social History Social History   Tobacco Use  . Smoking status: Former Smoker    Packs/day: 0.50    Years: 22.00    Pack years: 11.00    Types: Cigarettes    Last attempt to quit: 02/18/2017    Years since quitting: 1.4  . Smokeless tobacco: Never Used  Substance Use Topics  . Alcohol use:  Yes    Comment: occasionally  . Drug use: No     Allergies   Patient has no known allergies.   Review of Systems Review of Systems  Constitutional: Positive for fever.  HENT: Positive for ear pain. Negative for sore throat.   Respiratory: Positive for cough, shortness of breath and wheezing. Negative for hemoptysis.   Gastrointestinal: Positive for nausea. Negative for abdominal pain and vomiting.  Genitourinary: Negative for decreased urine volume.  All other systems reviewed and are negative.    Physical Exam Updated Vital Signs BP 104/71 (BP Location: Left Arm)   Pulse (!) 125   Temp 99.9 F (37.7 C) (Oral)   Resp (!) 24   Ht 1.651 m (5\' 5" )   Wt (!) 137.9 kg   LMP 07/01/2018 (Exact Date)   SpO2 93%   BMI 50.59 kg/m   Physical Exam CONSTITUTIONAL: Well developed/well nourished, ill-appearing HEAD: Normocephalic/atraumatic EYES: EOMI/PERRL ENMT: Mucous membranes moist left TM clear and intact, uvula midline without exudates NECK: supple no meningeal signs SPINE/BACK:entire spine nontender CV: S1/S2 noted, no murmurs/rubs/gallops noted, tachycardic LUNGS: Tachypnea, wheezing bilaterally ABDOMEN: soft, nontender, no rebound or guarding, bowel sounds noted throughout abdomen GU:no cva tenderness NEURO: Pt is awake/alert/appropriate, moves all extremitiesx4.  No facial droop.   EXTREMITIES: pulses normal/equal, full ROM SKIN: warm, color normal PSYCH: no abnormalities of mood noted, alert and oriented to situation   ED Treatments / Results  Labs (all labs ordered are listed, but only abnormal results are displayed) Labs Reviewed  BASIC METABOLIC PANEL - Abnormal; Notable for the following components:      Result Value   Sodium 134 (*)    Glucose, Bld 135 (*)    All other components within normal limits  CBC WITH DIFFERENTIAL/PLATELET - Abnormal; Notable for the following components:   WBC 15.4 (*)    Neutro Abs 12.9 (*)    All other components within  normal limits  INFLUENZA PANEL BY PCR (TYPE A & B)  I-STAT CG4 LACTIC ACID, ED  I-STAT BETA HCG BLOOD, ED ([facility], [facility], [facility] ONLY)    EKG None  Radiology Dg Chest Port 1 View  Result Date: 07/24/2018 CLINICAL DATA:  Shortness of breath, cough and congestion. History of asthma/COPD. EXAM: PORTABLE CHEST 1 VIEW COMPARISON:  Chest radiograph June 10, 2018 and priors. FINDINGS: The cardiac silhouette is mildly enlarged and unchanged. Increased bronchitic changes without pleural effusion or focal consolidation. No pneumothorax. Large body habitus. Osseous structures are non suspicious. IMPRESSION: 1. Increased bronchitic changes. 2. Mild cardiomegaly. Electronically Signed   By: Elon Alas M.D.   On: 07/24/2018 02:03    Procedures Procedures    Medications Ordered in ED Medications  albuterol (PROVENTIL) (  2.5 MG/3ML) 0.083% nebulizer solution 5 mg (5 mg Nebulization Given 07/24/18 0204)  ipratropium (ATROVENT) nebulizer solution 0.5 mg (0.5 mg Nebulization Given 07/24/18 0204)  predniSONE (DELTASONE) tablet 60 mg (60 mg Oral Given 07/24/18 0145)  ketorolac (TORADOL) 30 MG/ML injection 30 mg (30 mg Intravenous Given 07/24/18 0145)  sodium chloride 0.9 % bolus 1,000 mL (0 mLs Intravenous Stopped 07/24/18 0209)  albuterol (PROVENTIL,VENTOLIN) solution continuous neb (10 mg/hr Nebulization Given 07/24/18 0315)  oseltamivir (TAMIFLU) capsule 75 mg (75 mg Oral Given 07/24/18 0242)     Initial Impression / Assessment and Plan / ED Course  I have reviewed the triage vital signs and the nursing notes.  Pertinent labs & imaging results that were available during my care of the patient were reviewed by me and considered in my medical decision making (see chart for details).     1:24 AM Patient with history of poorly controlled asthma presents with shortness of breath.  Strong suspicion for influenza at this time.  Labs and imaging and nebulizer treatments been ordered 5:33 AM Patient  given multiple nebulized treatments and now feels improved.  She is awake alert in no acute distress.  Her work of breathing is improved.  She still has residual wheeze on lung exam She would like to be discharged.  She ambulated with no acute distress.  Blood pressure is above 100.  Heart rate is in the low 100s Advised her to use albuterol every 4 hours the next 2 to 3 days.  Also start prednisone.  Her flu test was negative, therefore will defer Tamiflu We discussed strict return precautions. Final Clinical Impressions(s) / ED Diagnoses   Final diagnoses:  Moderate persistent asthma with exacerbation  Viral illness    ED Discharge Orders         Ordered    predniSONE (DELTASONE) 50 MG tablet     07/24/18 0522    albuterol (PROVENTIL) (2.5 MG/3ML) 0.083% nebulizer solution  Every 6 hours PRN     07/24/18 Starr Lake, MD 07/24/18 [phone]

## 2018-07-24 NOTE — ED Triage Notes (Signed)
Pt arrives via POV from home c/o SOB, cough, congestion and left ear pain. Pt reports taking Dayquil earlier today and symptoms began Friday. Pt reports receiving Flu Shot this season. Symptoms unrelieved by inhaler or breathing treatments at home. Pt present tachypnic and tachycardic.

## 2018-07-24 NOTE — ED Notes (Signed)
Ambulated Pt around [place] with Pulse Oximeter. Pt maintained 91% O2 Sats. When returned to room and sitting on bed. Pts O2 Sat = 96%.

## 2018-07-24 NOTE — ED Notes (Signed)
Pt states breathing treatment helped and it "is not as difficult to breath anymore." Pt remains tachycardic and tachypnic, but no longer diaphoretic.

## 2018-07-24 NOTE — ED Notes (Signed)
ED Provider at bedside. 

## 2018-07-27 ENCOUNTER — Emergency Department (HOSPITAL_COMMUNITY)
Admission: EM | Admit: 2018-07-27 | Discharge: 2018-07-27 | Disposition: A | Discharge status: Home / Self Care | Payer: Self-pay | Attending: Emergency Medicine | Admitting: Emergency Medicine

## 2018-07-27 ENCOUNTER — Encounter (HOSPITAL_COMMUNITY): Payer: Self-pay

## 2018-07-27 ENCOUNTER — Other Ambulatory Visit: Payer: Self-pay

## 2018-07-27 DIAGNOSIS — Z87891 Personal history of nicotine dependence: Secondary | ICD-10-CM | POA: Insufficient documentation

## 2018-07-27 DIAGNOSIS — J4541 Moderate persistent asthma with (acute) exacerbation: Secondary | ICD-10-CM | POA: Insufficient documentation

## 2018-07-27 MED ORDER — ALBUTEROL SULFATE (2.5 MG/3ML) 0.083% IN NEBU
5.0000 mg | INHALATION_SOLUTION | Freq: Once | RESPIRATORY_TRACT | Status: AC
  Administered 2018-07-27: 5 mg via RESPIRATORY_TRACT
  Filled 2018-07-27: qty 6

## 2018-07-27 MED ORDER — ALBUTEROL (5 MG/ML) CONTINUOUS INHALATION SOLN
10.0000 mg/h | INHALATION_SOLUTION | Freq: Once | RESPIRATORY_TRACT | Status: AC
  Administered 2018-07-27: 10 mg/h via RESPIRATORY_TRACT
  Filled 2018-07-27: qty 20

## 2018-07-27 NOTE — ED Provider Notes (Signed)
[facility] EMERGENCY DEPARTMENT Provider Note   CSN: [id number] Arrival date & time: 07/27/18  0115  Time seen 1:32 AM   History   Chief Complaint Chief Complaint  Patient presents with  . Shortness of Breath    HPI Linda Ellis is a 42 y.o. female.  HPI patient has a history of reactive airway disease and has had at least 60 ED visits this year because she has no primary care doctor to manage her asthma.  She was last seen on December 22 for the same.  She was discharged home with 50 mg of prednisone to take once a day for 4 days.  She states that evening she started feeling more congested and started coughing up some green mucus and states sometimes there is some streaks of blood on it and she shows me a picture on her phone.  She states she had fever in the ED but has not had a fever since she was discharged.  Patient states she started getting more short of breath about 5 PM this evening.  Between 5 PM and 9 PM she used 3 albuterol pledgets of 2.5 mg without relief.  Her last nebulizer was at 9 PM.  She denies sore throat, rhinorrhea, nausea, vomiting, or diarrhea.  She states she quit smoking about a year ago and there is no secondhand smoke in her house.  PCP Patient, No Pcp Per   Past Medical History:  Diagnosis Date  . Asthma   . COPD (chronic obstructive pulmonary disease) ([place])   . Multiple gastric ulcers     Patient Active Problem List   Diagnosis Date Noted  . Hypoxia   . Gastroesophageal reflux disease   . Morbid obesity with BMI of 50.0-59.9, adult ([place]) 05/03/2018  . Acute respiratory failure with hypoxemia ([place]) 05/03/2018  . CAP (community acquired pneumonia) 06/24/2017  . Asthma, chronic, unspecified asthma severity, with acute exacerbation 06/24/2017  . Acute asthma exacerbation 06/24/2017    Past Surgical History:  Procedure Laterality Date  . CHOLECYSTECTOMY    . DIAGNOSTIC LAPAROSCOPY WITH REMOVAL OF ECTOPIC PREGNANCY Right 01/24/2017   Procedure:  REMOVAL OF ECTOPIC PREGNANCY;  Surgeon: Florian Buff, MD;  Location: [facility];  Service: Gynecology;  Laterality: Right;  . LAPAROSCOPIC UNILATERAL SALPINGECTOMY Right 01/24/2017   Procedure: LAPAROSCOPIC UNILATERAL SALPINGECTOMY;  Surgeon: Florian Buff, MD;  Location: [facility];  Service: Gynecology;  Laterality: Right;     OB History    Gravida  5   Para  4   Term  4   Preterm      AB      Living  4     SAB      TAB      Ectopic      Multiple      Live Births               Home Medications    Prior to Admission medications   Medication Sig Start Date End Date Taking? Authorizing Provider  albuterol (PROVENTIL HFA;VENTOLIN HFA) 108 (90 Base) MCG/ACT inhaler Inhale 1-2 puffs into the lungs every 6 (six) hours as needed for wheezing or shortness of breath. 03/30/18   Varney Biles, MD  albuterol (PROVENTIL) (2.5 MG/3ML) 0.083% nebulizer solution Take 3 mLs (2.5 mg total) by nebulization every 4 (four) hours as needed for wheezing or shortness of breath. 85/63/14   Delora Fuel, MD  albuterol (PROVENTIL) (2.5 MG/3ML) 0.083% nebulizer solution Take 3 mLs (2.5 mg total)  by nebulization every 6 (six) hours as needed for wheezing or shortness of breath. 07/24/18   Ripley Fraise, MD  predniSONE (DELTASONE) 50 MG tablet One tablet po daily 07/24/18   Ripley Fraise, MD  ranitidine (ZANTAC) 150 MG tablet Take 1 tablet (150 mg total) by mouth at bedtime. 05/04/18   Barton Dubois, MD  ALBUTEROL IN Inhale 2 puffs into the lungs as needed. For shortness of breath   10/24/11  [provider]    Family History Family History  Problem Relation Age of Onset  . Diabetes Maternal Grandfather   . Heart attack Maternal Grandfather     Social History Social History   Tobacco Use  . Smoking status: Former Smoker    Packs/day: 0.50    Years: 22.00    Pack years: 11.00    Types: Cigarettes    Last attempt to quit: 02/18/2017    Years since quitting: 1.4  . Smokeless  tobacco: Never Used  Substance Use Topics  . Alcohol use: Yes    Comment: occasionally  . Drug use: No  unemployed Lives with boyfriend and depends on him for her upkeep Quit smoking 1 year ago   Allergies   Patient has no known allergies.   Review of Systems Review of Systems  All other systems reviewed and are negative.    Physical Exam Updated Vital Signs BP 122/85 (BP Location: Left Arm)   Pulse 96   Temp 97.8 F (36.6 C) (Oral)   Resp (!) 28   Ht 5\' 5"  (1.651 m)   Wt (!) 137.9 kg   LMP 07/01/2018 (Exact Date)   SpO2 95%   BMI 50.59 kg/m   Vital signs normal    Physical Exam Vitals signs and nursing note reviewed.  Constitutional:      General: She is not in acute distress.    Appearance: Normal appearance. She is well-developed. She is obese. She is not ill-appearing or toxic-appearing.     Comments: Patient developing moon face ease  HENT:     Head: Normocephalic and atraumatic.     Right Ear: External ear normal.     Left Ear: External ear normal.     Nose: Nose normal. No mucosal edema or rhinorrhea.     Mouth/Throat:     Dentition: No dental abscesses.     Pharynx: No uvula swelling.  Eyes:     Conjunctiva/sclera: Conjunctivae normal.     Pupils: Pupils are equal, round, and reactive to light.  Neck:     Musculoskeletal: Full passive range of motion without pain, normal range of motion and neck supple.  Cardiovascular:     Rate and Rhythm: Normal rate and regular rhythm.     Heart sounds: Normal heart sounds. No murmur. No friction rub. No gallop.   Pulmonary:     Effort: Tachypnea, accessory muscle usage, prolonged expiration and respiratory distress present.     Breath sounds: Decreased breath sounds and wheezing present. No rhonchi or rales.  Chest:     Chest wall: No tenderness or crepitus.  Abdominal:     General: Bowel sounds are normal. There is no distension.     Palpations: Abdomen is soft.     Tenderness: There is no abdominal  tenderness. There is no guarding or rebound.  Musculoskeletal: Normal range of motion.        General: No tenderness.     Comments: Moves all extremities well.   Skin:    General: Skin is warm  and dry.     Coloration: Skin is not pale.     Findings: No erythema or rash.  Neurological:     Mental Status: She is alert and oriented to person, place, and time.     Cranial Nerves: No cranial nerve deficit.  Psychiatric:        Mood and Affect: Mood is not anxious.        Speech: Speech normal.        Behavior: Behavior normal.      ED Treatments / Results  Labs (all labs ordered are listed, but only abnormal results are displayed) Labs Reviewed - No data to display  EKG EKG Interpretation  Date/Time:  Wednesday July 27 2018 01:35:55 EST Ventricular Rate:  86 PR Interval:    QRS Duration: 110 QT Interval:  388 QTC Calculation: 465 R Axis:   79 Text Interpretation:  Sinus rhythm RSR' in V1 or V2, right VCD or RVH Borderline T abnormalities, anterior leads Baseline wander in lead(s) I II aVR aVF V1 No significant change since last tracing 30 Mar 2018 Confirmed by Rolland Porter [phone]) on 07/27/2018 2:17:44 AM   Radiology No results found.  Procedures Procedures (including critical care time)  Medications Ordered in ED Medications  albuterol (PROVENTIL,VENTOLIN) solution continuous neb (10 mg/hr Nebulization Given 07/27/18 0207)  albuterol (PROVENTIL) (2.5 MG/3ML) 0.083% nebulizer solution 5 mg (5 mg Nebulization Given 07/27/18 0544)     Initial Impression / Assessment and Plan / ED Course  I have reviewed the triage vital signs and the nursing notes.  Pertinent labs & imaging results that were available during my care of the patient were reviewed by me and considered in my medical decision making (see chart for details).   I ordered a continuous nebulizer for the patient and her bronchospasm.  Recheck at 2:55 AM patient is still getting her continuous nebulizer.   When I listen to her she has much improved air movement.  She still has some end expiratory wheezing.  I will recheck her again when her treatment has finished.  Recheck 4:15 AM patient is still on her first continuous nebulizer.  She states she feels like she is wheezing more.  When I examine her she is having more wheezing than last time I saw her.  She still has good air movement.  She states respiratory therapist came and adjusted the flow rate on her nebulizer so she feels like it is running faster now.  Recheck at 5:28 AM she has almost finished her continuous nebulizer.  She has improved air movement and has rare wheezing now.  We discussed getting a single nebulizer after this and hopefully should be able to be discharged.  I did not give her any extra steroids because she is already on a 4-day dose of 50 mg that was started on the 22nd.  Recheck at 6:10 AM after the single nebulizer patient's lungs are now clear, she states she is breathing much easier.  She feels ready to be discharged.  We discussed her using her nebulizer at home, the plastic pledgets only have 2.5 mg of albuterol, she was advised to use 2 at a time.  She has 1 more pill of the steroids left.  She states she has been prescribed Symbicort and Advair in the past however she cannot afford to buy them.  Final Clinical Impressions(s) / ED Diagnoses   Final diagnoses:  Moderate persistent asthma with exacerbation    ED Discharge Orders  None     Plan discharge  Rolland Porter, MD, Barbette Or, MD 07/27/18 [phone]

## 2018-07-27 NOTE — ED Notes (Signed)
Pt ambulatory to room from [place]- respirations not labored- oxygen 95% on room air.

## 2018-07-27 NOTE — ED Triage Notes (Signed)
Pt was seen 3 days ago for the same- Rx prednisone and albuterol nebs. Pt reports productive cough (green today and red yesterday) per pt report. Pt continues feeling bad, coughing, and SOB.

## 2018-07-27 NOTE — Discharge Instructions (Addendum)
Finish your steroids, continue using your nebulizer for shortness of breath and wheezing.  Please try to get a primary care doctor to help you manage your asthma.  Recheck as needed.

## 2018-07-27 NOTE — ED Notes (Signed)
RT at bedside for neb tx

## 2018-07-30 ENCOUNTER — Other Ambulatory Visit: Payer: Self-pay

## 2018-07-30 ENCOUNTER — Encounter (HOSPITAL_COMMUNITY): Payer: Self-pay

## 2018-07-30 ENCOUNTER — Observation Stay (HOSPITAL_COMMUNITY)
Admission: EM | Admit: 2018-07-30 | Discharge: 2018-07-31 | Disposition: A | Discharge status: Home / Self Care | Payer: Self-pay | Attending: Internal Medicine | Admitting: Internal Medicine

## 2018-07-30 ENCOUNTER — Emergency Department (HOSPITAL_COMMUNITY): Payer: Self-pay

## 2018-07-30 DIAGNOSIS — J4550 Severe persistent asthma, uncomplicated: Secondary | ICD-10-CM | POA: Diagnosis present

## 2018-07-30 DIAGNOSIS — J45901 Unspecified asthma with (acute) exacerbation: Secondary | ICD-10-CM | POA: Insufficient documentation

## 2018-07-30 DIAGNOSIS — Z79899 Other long term (current) drug therapy: Secondary | ICD-10-CM | POA: Insufficient documentation

## 2018-07-30 DIAGNOSIS — K219 Gastro-esophageal reflux disease without esophagitis: Secondary | ICD-10-CM | POA: Diagnosis present

## 2018-07-30 DIAGNOSIS — E876 Hypokalemia: Secondary | ICD-10-CM | POA: Diagnosis present

## 2018-07-30 DIAGNOSIS — Z87891 Personal history of nicotine dependence: Secondary | ICD-10-CM | POA: Insufficient documentation

## 2018-07-30 DIAGNOSIS — J209 Acute bronchitis, unspecified: Principal | ICD-10-CM | POA: Diagnosis present

## 2018-07-30 DIAGNOSIS — Z6841 Body Mass Index (BMI) 40.0 and over, adult: Secondary | ICD-10-CM

## 2018-07-30 DIAGNOSIS — E669 Obesity, unspecified: Secondary | ICD-10-CM | POA: Insufficient documentation

## 2018-07-30 DIAGNOSIS — J4551 Severe persistent asthma with (acute) exacerbation: Secondary | ICD-10-CM | POA: Diagnosis present

## 2018-07-30 DIAGNOSIS — Z7952 Long term (current) use of systemic steroids: Secondary | ICD-10-CM | POA: Diagnosis present

## 2018-07-30 DIAGNOSIS — J45909 Unspecified asthma, uncomplicated: Secondary | ICD-10-CM | POA: Insufficient documentation

## 2018-07-30 DIAGNOSIS — J4541 Moderate persistent asthma with (acute) exacerbation: Secondary | ICD-10-CM

## 2018-07-30 DIAGNOSIS — K210 Gastro-esophageal reflux disease with esophagitis: Secondary | ICD-10-CM

## 2018-07-30 DIAGNOSIS — J40 Bronchitis, not specified as acute or chronic: Secondary | ICD-10-CM

## 2018-07-30 LAB — CBC
HCT: 35.8 % — ABNORMAL LOW (ref 36.0–46.0)
HEMOGLOBIN: 11.2 g/dL — AB (ref 12.0–15.0)
MCH: 29.5 pg (ref 26.0–34.0)
MCHC: 31.3 g/dL (ref 30.0–36.0)
MCV: 94.2 fL (ref 80.0–100.0)
NRBC: 0 % (ref 0.0–0.2)
PLATELETS: 365 10*3/uL (ref 150–400)
RBC: 3.8 MIL/uL — AB (ref 3.87–5.11)
RDW: 13.2 % (ref 11.5–15.5)
WBC: 8.8 10*3/uL (ref 4.0–10.5)

## 2018-07-30 LAB — BASIC METABOLIC PANEL
ANION GAP: 6 (ref 5–15)
BUN: 9 mg/dL (ref 6–20)
CHLORIDE: 107 mmol/L (ref 98–111)
CO2: 26 mmol/L (ref 22–32)
Calcium: 7.8 mg/dL — ABNORMAL LOW (ref 8.9–10.3)
Creatinine, Ser: 0.71 mg/dL (ref 0.44–1.00)
Glucose, Bld: 100 mg/dL — ABNORMAL HIGH (ref 70–99)
POTASSIUM: 3 mmol/L — AB (ref 3.5–5.1)
SODIUM: 139 mmol/L (ref 135–145)

## 2018-07-30 MED ORDER — BUDESONIDE 0.25 MG/2ML IN SUSP
0.2500 mg | Freq: Two times a day (BID) | RESPIRATORY_TRACT | Status: DC
  Administered 2018-07-30: 0.25 mg via RESPIRATORY_TRACT
  Filled 2018-07-30: qty 2

## 2018-07-30 MED ORDER — AZITHROMYCIN 250 MG PO TABS
250.0000 mg | ORAL_TABLET | Freq: Every day | ORAL | Status: DC
  Administered 2018-07-31: 250 mg via ORAL
  Filled 2018-07-30: qty 1

## 2018-07-30 MED ORDER — ONDANSETRON HCL 4 MG PO TABS: 4.0000 mg | ORAL_TABLET | Freq: Four times a day (QID) | ORAL | Status: DC | PRN

## 2018-07-30 MED ORDER — IPRATROPIUM-ALBUTEROL 0.5-2.5 (3) MG/3ML IN SOLN
3.0000 mL | Freq: Four times a day (QID) | RESPIRATORY_TRACT | Status: DC
  Administered 2018-07-30 – 2018-07-31 (×4): 3 mL via RESPIRATORY_TRACT
  Filled 2018-07-30 (×4): qty 3

## 2018-07-30 MED ORDER — IPRATROPIUM-ALBUTEROL 0.5-2.5 (3) MG/3ML IN SOLN
3.0000 mL | Freq: Once | RESPIRATORY_TRACT | Status: AC
  Administered 2018-07-30: 3 mL via RESPIRATORY_TRACT
  Filled 2018-07-30: qty 3

## 2018-07-30 MED ORDER — GUAIFENESIN ER 600 MG PO TB12
600.0000 mg | ORAL_TABLET | Freq: Two times a day (BID) | ORAL | Status: DC
  Administered 2018-07-30 – 2018-07-31 (×3): 600 mg via ORAL
  Filled 2018-07-30 (×3): qty 1

## 2018-07-30 MED ORDER — POTASSIUM CHLORIDE CRYS ER 20 MEQ PO TBCR
40.0000 meq | EXTENDED_RELEASE_TABLET | Freq: Once | ORAL | Status: AC
  Administered 2018-07-30: 40 meq via ORAL
  Filled 2018-07-30: qty 2

## 2018-07-30 MED ORDER — PANTOPRAZOLE SODIUM 40 MG PO TBEC
40.0000 mg | DELAYED_RELEASE_TABLET | Freq: Every day | ORAL | Status: DC
  Administered 2018-07-30 – 2018-07-31 (×2): 40 mg via ORAL
  Filled 2018-07-30 (×2): qty 1

## 2018-07-30 MED ORDER — ALBUTEROL SULFATE (2.5 MG/3ML) 0.083% IN NEBU
2.5000 mg | INHALATION_SOLUTION | RESPIRATORY_TRACT | Status: DC | PRN
  Administered 2018-07-30 – 2018-07-31 (×3): 2.5 mg via RESPIRATORY_TRACT
  Filled 2018-07-30 (×3): qty 3

## 2018-07-30 MED ORDER — PREDNISONE 50 MG PO TABS
60.0000 mg | ORAL_TABLET | Freq: Once | ORAL | Status: AC
  Administered 2018-07-30: 60 mg via ORAL
  Filled 2018-07-30: qty 1

## 2018-07-30 MED ORDER — FAMOTIDINE 20 MG PO TABS
20.0000 mg | ORAL_TABLET | Freq: Every day | ORAL | Status: DC
  Administered 2018-07-30: 20 mg via ORAL
  Filled 2018-07-30: qty 1

## 2018-07-30 MED ORDER — KETOROLAC TROMETHAMINE 30 MG/ML IJ SOLN
30.0000 mg | Freq: Once | INTRAMUSCULAR | Status: AC
  Administered 2018-07-30: 30 mg via INTRAMUSCULAR
  Filled 2018-07-30: qty 1

## 2018-07-30 MED ORDER — KETOROLAC TROMETHAMINE 30 MG/ML IJ SOLN
30.0000 mg | Freq: Four times a day (QID) | INTRAMUSCULAR | Status: DC | PRN
  Administered 2018-07-30 – 2018-07-31 (×2): 30 mg via INTRAMUSCULAR
  Filled 2018-07-30 (×2): qty 1

## 2018-07-30 MED ORDER — METHYLPREDNISOLONE SODIUM SUCC 125 MG IJ SOLR
60.0000 mg | Freq: Four times a day (QID) | INTRAMUSCULAR | Status: DC
  Administered 2018-07-30 – 2018-07-31 (×5): 60 mg via INTRAVENOUS
  Filled 2018-07-30 (×5): qty 2

## 2018-07-30 MED ORDER — ACETAMINOPHEN 650 MG RE SUPP: 650.0000 mg | Freq: Four times a day (QID) | RECTAL | Status: DC | PRN

## 2018-07-30 MED ORDER — ONDANSETRON HCL 4 MG/2ML IJ SOLN: 4.0000 mg | Freq: Four times a day (QID) | INTRAMUSCULAR | Status: DC | PRN

## 2018-07-30 MED ORDER — BENZONATATE 100 MG PO CAPS
200.0000 mg | ORAL_CAPSULE | Freq: Three times a day (TID) | ORAL | Status: DC
  Administered 2018-07-30 – 2018-07-31 (×3): 200 mg via ORAL
  Filled 2018-07-30 (×3): qty 2

## 2018-07-30 MED ORDER — AZITHROMYCIN 250 MG PO TABS
500.0000 mg | ORAL_TABLET | Freq: Every day | ORAL | Status: AC
  Administered 2018-07-30: 500 mg via ORAL
  Filled 2018-07-30: qty 2

## 2018-07-30 MED ORDER — ENOXAPARIN SODIUM 40 MG/0.4ML ~~LOC~~ SOLN
40.0000 mg | SUBCUTANEOUS | Status: DC
  Administered 2018-07-30: 40 mg via SUBCUTANEOUS
  Filled 2018-07-30 (×2): qty 0.4

## 2018-07-30 MED ORDER — ACETAMINOPHEN 325 MG PO TABS: 650.0000 mg | ORAL_TABLET | Freq: Four times a day (QID) | ORAL | Status: DC | PRN

## 2018-07-30 NOTE — H&P (Signed)
History and Physical    Linda Ellis GTX:[id number] DOB: November 30, 1975 DOA: 07/30/2018  PCP: Patient, No Pcp Per  Patient coming from: home  I have personally briefly reviewed patient's old medical records in [place]  Chief Complaint: Shortness of breath  HPI: Linda Ellis is a 42 y.o. female with medical history significant of asthma, GERD, morbid obesity.  Patient has been complaining of shortness of breath, wheezing, productive cough for the last week.  This is her third visit to the emergency room in the past week.  She initially developed fevers for approximately 48 hours with associated cough productive of brownish sputum, nasal congestion, sore throat, cough and wheezing.  She used her nebulizer at home with limited benefit.  She had come to the ER twice, received steroids, bronchodilators with improvement of her breathing and was discharged home.  Unfortunately her symptoms have persisted.  She comes back with worsening shortness of breath and wheezing as well as productive cough.  ED Course: Patient received bronchodilator treatment, prednisone with some improvement of her symptoms.  Chest x-ray did not show any evidence of pneumonia.  She recently had influenza panel that was negative.  She is been referred for observation.  Review of Systems: As per HPI otherwise 10 point review of systems negative.    Past Medical History:  Diagnosis Date  . Asthma   . COPD (chronic obstructive pulmonary disease) ([place])   . Multiple gastric ulcers     Past Surgical History:  Procedure Laterality Date  . CHOLECYSTECTOMY    . DIAGNOSTIC LAPAROSCOPY WITH REMOVAL OF ECTOPIC PREGNANCY Right 01/24/2017   Procedure: REMOVAL OF ECTOPIC PREGNANCY;  Surgeon: Florian Buff, MD;  Location: [facility];  Service: Gynecology;  Laterality: Right;  . LAPAROSCOPIC UNILATERAL SALPINGECTOMY Right 01/24/2017   Procedure: LAPAROSCOPIC UNILATERAL SALPINGECTOMY;  Surgeon: Florian Buff, MD;  Location: [facility];   Service: Gynecology;  Laterality: Right;    Social History:  reports that she quit smoking about 17 months ago. Her smoking use included cigarettes. She has a 11.00 pack-year smoking history. She has never used smokeless tobacco. She reports current alcohol use. She reports that she does not use drugs.  No Known Allergies  Family History  Problem Relation Age of Onset  . Diabetes Maternal Grandfather   . Heart attack Maternal Grandfather      Prior to Admission medications   Medication Sig Start Date End Date Taking? Authorizing Provider  albuterol (PROVENTIL HFA;VENTOLIN HFA) 108 (90 Base) MCG/ACT inhaler Inhale 1-2 puffs into the lungs every 6 (six) hours as needed for wheezing or shortness of breath. 03/30/18  Yes Nanavati, Ankit, MD  albuterol (PROVENTIL) (2.5 MG/3ML) 0.083% nebulizer solution Take 3 mLs (2.5 mg total) by nebulization every 6 (six) hours as needed for wheezing or shortness of breath. 07/24/18  Yes Ripley Fraise, MD  ranitidine (ZANTAC) 150 MG tablet Take 1 tablet (150 mg total) by mouth at bedtime. 05/04/18  Yes Barton Dubois, MD  ALBUTEROL IN Inhale 2 puffs into the lungs as needed. For shortness of breath   10/24/11  [provider]    Physical Exam: Vitals:   07/30/18 1200 07/30/18 1230 07/30/18 1300 07/30/18 1330  BP: 114/75 130/73 108/78 125/86  Pulse: 78 68 81 80  Resp: 17 17 (!) 21 (!) 30  Temp:      TempSrc:      SpO2: 94% 96% 95% 95%  Weight:      Height:  Constitutional: NAD, calm, comfortable Eyes: PERRL, lids and conjunctivae normal ENMT: Mucous membranes are moist. Posterior pharynx clear of any exudate or lesions.Normal dentition.  Neck: normal, supple, no masses, no thyromegaly Respiratory: clear to auscultation bilaterally, no wheezing, no crackles. Normal respiratory effort. No accessory muscle use.  Cardiovascular: Regular rate and rhythm, no murmurs / rubs / gallops. No extremity edema. 2+ pedal pulses. No carotid  bruits.  Abdomen: no tenderness, no masses palpated. No hepatosplenomegaly. Bowel sounds positive.  Musculoskeletal: no clubbing / cyanosis. No joint deformity upper and lower extremities. Good ROM, no contractures. Normal muscle tone.  Skin: no rashes, lesions, ulcers. No induration Neurologic: CN 2-12 grossly intact. Sensation intact, DTR normal. Strength 5/5 in all 4.  Psychiatric: Normal judgment and insight. Alert and oriented x 3. Normal mood.    Labs on Admission: I have personally reviewed following labs and imaging studies  CBC: Recent Labs  Lab 07/24/18 0136 07/30/18 1000  WBC 15.4* 8.8  NEUTROABS 12.9*  --   HGB 13.5 11.2*  HCT 41.3 35.8*  MCV 92.0 94.2  PLT 337 829   Basic Metabolic Panel: Recent Labs  Lab 07/24/18 0136 07/30/18 1000  NA 134* 139  K 3.8 3.0*  CL 103 107  CO2 23 26  GLUCOSE 135* 100*  BUN 9 9  CREATININE 0.67 0.71  CALCIUM 9.1 7.8*   GFR: Estimated Creatinine Clearance: 131.9 mL/min (by C-G formula based on SCr of 0.71 mg/dL). Liver Function Tests: No results for input(s): AST, ALT, ALKPHOS, BILITOT, PROT, ALBUMIN in the last 168 hours. No results for input(s): LIPASE, AMYLASE in the last 168 hours. No results for input(s): AMMONIA in the last 168 hours. Coagulation Profile: No results for input(s): INR, PROTIME in the last 168 hours. Cardiac Enzymes: No results for input(s): CKTOTAL, CKMB, CKMBINDEX, TROPONINI in the last 168 hours. BNP (last 3 results) No results for input(s): PROBNP in the last 8760 hours. HbA1C: No results for input(s): HGBA1C in the last 72 hours. CBG: No results for input(s): GLUCAP in the last 168 hours. Lipid Profile: No results for input(s): CHOL, HDL, LDLCALC, TRIG, CHOLHDL, LDLDIRECT in the last 72 hours. Thyroid Function Tests: No results for input(s): TSH, T4TOTAL, FREET4, T3FREE, THYROIDAB in the last 72 hours. Anemia Panel: No results for input(s): VITAMINB12, FOLATE, FERRITIN, TIBC, IRON, RETICCTPCT  in the last 72 hours. Urine analysis:    Component Value Date/Time   COLORURINE YELLOW 01/06/2018 1230   APPEARANCEUR HAZY (A) 01/06/2018 1230   LABSPEC 1.026 01/06/2018 1230   PHURINE 5.0 01/06/2018 1230   GLUCOSEU NEGATIVE 01/06/2018 1230   HGBUR SMALL (A) 01/06/2018 1230   BILIRUBINUR NEGATIVE 01/06/2018 1230   KETONESUR NEGATIVE 01/06/2018 1230   PROTEINUR NEGATIVE 01/06/2018 1230   UROBILINOGEN 1.0 06/23/2010 1918   NITRITE NEGATIVE 01/06/2018 1230   LEUKOCYTESUR SMALL (A) 01/06/2018 1230    Radiological Exams on Admission: Dg Chest 2 View  Result Date: 07/30/2018 CLINICAL DATA:  Patient with persistent cough. EXAM: CHEST - 2 VIEW COMPARISON:  Chest radiograph 07/24/2018 FINDINGS: Monitoring leads overlie the patient. Stable cardiomegaly. No consolidative pulmonary opacities. No pleural effusion or pneumothorax. Thoracic spine degenerative changes. IMPRESSION: No acute cardiopulmonary process. Electronically Signed   By: Lovey Newcomer M.D.   On: 07/30/2018 09:01    EKG: Independently reviewed.  Sinus rhythm without acute changes  Assessment/Plan Active Problems:   Morbid obesity with BMI of 50.0-59.9, adult (HCC)   Gastroesophageal reflux disease   Asthma exacerbation   Hypokalemia  Acute bronchitis     1. Acute asthma exacerbation.  Likely precipitated by acute bronchitis.  Started on IV steroids, bronchodilators.  We will also prescribe inhaled steroids.  Will provide a course of antibiotics and she has had a persistent productive cough.  Continue mucolytics. 2. Gastroesophageal reflux disease.  Continue H2 blockers and add PPI.  Advised on lifestyle modification. 3. Hypokalemia.  Likely related to bronchodilators.  Replace 4. Morbid obesity.  Counseled on importance of diet and exercise.  DVT prophylaxis: Lovenox Code Status: Full code Family Communication: No family present Disposition Plan: Discharge home in a.m. if respiratory status is improved Consults  called:   Admission status: Observation, MedSurg  Kathie Dike MD [facility] Pager [phone]  If 7PM-7AM, please contact night-coverage [email or web address] Password [email or web address]  07/30/2018, 2:18 PM

## 2018-07-30 NOTE — Plan of Care (Signed)

## 2018-07-30 NOTE — ED Provider Notes (Signed)
[facility] EMERGENCY DEPARTMENT Provider Note   CSN: [id number] Arrival date & time: 07/30/18  1950     History   Chief Complaint Chief Complaint  Patient presents with  . Shortness of Breath    HPI Linda Ellis is a 42 y.o. female.  Patient presenting with a complaint of shortness of breath.  She does have a history of asthma.  Patient's been seen frequently started December 22 for the same complaint.  Patient did have a negative chest x-ray on the 22nd.  Was seen again again on Christmas Day no chest x-ray at that time.  Patient has been treating herself with albuterol inhaler and has been on courses of prednisone but has been off recently patient feeling worse since stopping prednisone.  Patient feels as if she is having wheezing throughout and just cannot get this improved.  Associated with cough and that the cough is a productive.  Patient does not use oxygen at home.  Past medical history significant for asthma as well as COPD.     Past Medical History:  Diagnosis Date  . Asthma   . COPD (chronic obstructive pulmonary disease) ([place])   . Multiple gastric ulcers     Patient Active Problem List   Diagnosis Date Noted  . Hypoxia   . Gastroesophageal reflux disease   . Morbid obesity with BMI of 50.0-59.9, adult ([place]) 05/03/2018  . Acute respiratory failure with hypoxemia ([place]) 05/03/2018  . CAP (community acquired pneumonia) 06/24/2017  . Asthma, chronic, unspecified asthma severity, with acute exacerbation 06/24/2017  . Acute asthma exacerbation 06/24/2017    Past Surgical History:  Procedure Laterality Date  . CHOLECYSTECTOMY    . DIAGNOSTIC LAPAROSCOPY WITH REMOVAL OF ECTOPIC PREGNANCY Right 01/24/2017   Procedure: REMOVAL OF ECTOPIC PREGNANCY;  Surgeon: Florian Buff, MD;  Location: [facility];  Service: Gynecology;  Laterality: Right;  . LAPAROSCOPIC UNILATERAL SALPINGECTOMY Right 01/24/2017   Procedure: LAPAROSCOPIC UNILATERAL SALPINGECTOMY;  Surgeon: Florian Buff, MD;  Location: [facility];  Service: Gynecology;  Laterality: Right;     OB History    Gravida  5   Para  4   Term  4   Preterm      AB      Living  4     SAB      TAB      Ectopic      Multiple      Live Births               Home Medications    Prior to Admission medications   Medication Sig Start Date End Date Taking? Authorizing Provider  albuterol (PROVENTIL HFA;VENTOLIN HFA) 108 (90 Base) MCG/ACT inhaler Inhale 1-2 puffs into the lungs every 6 (six) hours as needed for wheezing or shortness of breath. 03/30/18  Yes Nanavati, Ankit, MD  albuterol (PROVENTIL) (2.5 MG/3ML) 0.083% nebulizer solution Take 3 mLs (2.5 mg total) by nebulization every 6 (six) hours as needed for wheezing or shortness of breath. 07/24/18  Yes Ripley Fraise, MD  ranitidine (ZANTAC) 150 MG tablet Take 1 tablet (150 mg total) by mouth at bedtime. 05/04/18  Yes Barton Dubois, MD  ALBUTEROL IN Inhale 2 puffs into the lungs as needed. For shortness of breath   10/24/11  [provider]    Family History Family History  Problem Relation Age of Onset  . Diabetes Maternal Grandfather   . Heart attack Maternal Grandfather     Social History Social History  Tobacco Use  . Smoking status: Former Smoker    Packs/day: 0.50    Years: 22.00    Pack years: 11.00    Types: Cigarettes    Last attempt to quit: 02/18/2017    Years since quitting: 1.4  . Smokeless tobacco: Never Used  Substance Use Topics  . Alcohol use: Yes    Comment: occasionally  . Drug use: No     Allergies   Patient has no known allergies.   Review of Systems Review of Systems  Constitutional: Negative for fever.  HENT: Negative for congestion and sore throat.   Eyes: Negative for redness.  Respiratory: Positive for cough, shortness of breath and wheezing.   Cardiovascular: Negative for chest pain.  Gastrointestinal: Negative for abdominal pain.  Genitourinary: Negative for dysuria.    Musculoskeletal: Negative for myalgias.  Skin: Negative for rash.  Neurological: Negative for syncope and headaches.  Hematological: Does not bruise/bleed easily.  Psychiatric/Behavioral: Negative for confusion.     Physical Exam Updated Vital Signs BP 114/75   Pulse 78   Temp 99.3 F (37.4 C) (Oral)   Resp 17   Ht 1.651 m (5\' 5" )   Wt (!) 137.9 kg   LMP 07/27/2018   SpO2 94%   BMI 50.59 kg/m   Physical Exam Vitals signs and nursing note reviewed.  Constitutional:      Appearance: Normal appearance. She is ill-appearing. She is not toxic-appearing.  HENT:     Mouth/Throat:     Mouth: Mucous membranes are moist.  Eyes:     Extraocular Movements: Extraocular movements intact.     Conjunctiva/sclera: Conjunctivae normal.     Pupils: Pupils are equal, round, and reactive to light.  Neck:     Musculoskeletal: Normal range of motion and neck supple.  Cardiovascular:     Rate and Rhythm: Normal rate and regular rhythm.  Pulmonary:     Effort: Pulmonary effort is normal. No respiratory distress.     Breath sounds: Wheezing present.  Abdominal:     General: Abdomen is flat. Bowel sounds are normal.  Musculoskeletal: Normal range of motion.  Skin:    General: Skin is warm.  Neurological:     General: No focal deficit present.     Mental Status: She is alert and oriented to person, place, and time.      ED Treatments / Results  Labs (all labs ordered are listed, but only abnormal results are displayed) Labs Reviewed  CBC - Abnormal; Notable for the following components:      Result Value   RBC 3.80 (*)    Hemoglobin 11.2 (*)    HCT 35.8 (*)    All other components within normal limits  BASIC METABOLIC PANEL - Abnormal; Notable for the following components:   Potassium 3.0 (*)    Glucose, Bld 100 (*)    Calcium 7.8 (*)    All other components within normal limits    EKG EKG Interpretation  Date/Time:  Saturday July 30 2018 08:18:50 EST Ventricular  Rate:  85 PR Interval:    QRS Duration: 108 QT Interval:  366 QTC Calculation: 436 R Axis:   84 Text Interpretation:  Sinus rhythm Low voltage, precordial leads RSR' in V1 or V2, right VCD or RVH Borderline T abnormalities, anterior leads Confirmed by Fredia Sorrow [id number]) on 07/30/2018 8:21:42 AM   Radiology Dg Chest 2 View  Result Date: 07/30/2018 CLINICAL DATA:  Patient with persistent cough. EXAM: CHEST - 2 VIEW COMPARISON:  Chest radiograph 07/24/2018 FINDINGS: Monitoring leads overlie the patient. Stable cardiomegaly. No consolidative pulmonary opacities. No pleural effusion or pneumothorax. Thoracic spine degenerative changes. IMPRESSION: No acute cardiopulmonary process. Electronically Signed   By: Lovey Newcomer M.D.   On: 07/30/2018 09:01    Procedures Procedures (including critical care time)  Medications Ordered in ED Medications  ipratropium-albuterol (DUONEB) 0.5-2.5 (3) MG/3ML nebulizer solution 3 mL (3 mLs Nebulization Given 07/30/18 0908)  predniSONE (DELTASONE) tablet 60 mg (60 mg Oral Given 07/30/18 0903)  ipratropium-albuterol (DUONEB) 0.5-2.5 (3) MG/3ML nebulizer solution 3 mL (3 mLs Nebulization Given 07/30/18 1003)  ketorolac (TORADOL) 30 MG/ML injection 30 mg (30 mg Intramuscular Given 07/30/18 0942)  ipratropium-albuterol (DUONEB) 0.5-2.5 (3) MG/3ML nebulizer solution 3 mL (3 mLs Nebulization Given 07/30/18 1128)     Initial Impression / Assessment and Plan / ED Course  I have reviewed the triage vital signs and the nursing notes.  Pertinent labs & imaging results that were available during my care of the patient were reviewed by me and considered in my medical decision making (see chart for details).    Patient with significant bilateral wheezing.  Patient ended up receiving 3 nebulizer treatments with some improvement but still had bilateral wheezing.  Patient also received 60 mg of prednisone p.o. when she first got here.  Based on the persistent  wheezing patient will require admission probably continued steroids.  Hopefully will clear by tomorrow.  Chest x-ray negative for pneumonia.  Labs without significant abnormalities other than the potassium was 3.0.  But patient did not have any lab work done until she already received 2 nebulizer treatments I suspect that is the reason for the lower potassium but this will need to be followed.  Discussed with hospitalist they will admit.   Final Clinical Impressions(s) / ED Diagnoses   Final diagnoses:  Bronchitis  Moderate persistent asthma with exacerbation    ED Discharge Orders    None       Fredia Sorrow, MD 07/30/18 [phone]

## 2018-07-30 NOTE — ED Triage Notes (Addendum)
Pt reports that her SOB is getting worse. Pt last seen 07/27/18 for same symptoms. Pt reports that she is getting worse everyday .Cough is productive. Reports she cannot stop coughing

## 2018-07-31 LAB — BASIC METABOLIC PANEL
Anion gap: 10 (ref 5–15)
BUN: 10 mg/dL (ref 6–20)
CALCIUM: 8.3 mg/dL — AB (ref 8.9–10.3)
CO2: 22 mmol/L (ref 22–32)
Chloride: 105 mmol/L (ref 98–111)
Creatinine, Ser: 0.76 mg/dL (ref 0.44–1.00)
GFR calc Af Amer: >60 mL/min (ref >60)
GFR calc non Af Amer: >60 mL/min (ref >60)
GLUCOSE: 214 mg/dL — AB (ref 70–99)
Potassium: 3.6 mmol/L (ref 3.5–5.1)
Sodium: 137 mmol/L (ref 135–145)

## 2018-07-31 LAB — MAGNESIUM: Magnesium: 2.2 mg/dL (ref 1.7–2.4)

## 2018-07-31 MED ORDER — FLUTICASONE PROPIONATE 50 MCG/ACT NA SUSP
2.0000 | Freq: Every day | NASAL | Status: DC
  Administered 2018-07-31: 2 via NASAL
  Filled 2018-07-31: qty 16

## 2018-07-31 MED ORDER — BUDESONIDE 0.5 MG/2ML IN SUSP
0.5000 mg | Freq: Two times a day (BID) | RESPIRATORY_TRACT | Status: DC
  Filled 2018-07-31: qty 2

## 2018-07-31 MED ORDER — LORATADINE 10 MG PO TABS
10.0000 mg | ORAL_TABLET | Freq: Every day | ORAL | Status: DC
  Administered 2018-07-31: 10 mg via ORAL
  Filled 2018-07-31: qty 1

## 2018-07-31 MED ORDER — POTASSIUM CHLORIDE CRYS ER 20 MEQ PO TBCR
40.0000 meq | EXTENDED_RELEASE_TABLET | Freq: Once | ORAL | Status: AC
  Administered 2018-07-31: 40 meq via ORAL
  Filled 2018-07-31: qty 2

## 2018-07-31 MED ORDER — BECLOMETHASONE DIPROP HFA 80 MCG/ACT IN AERB: 2.0000 | INHALATION_SPRAY | Freq: Two times a day (BID) | RESPIRATORY_TRACT | 1 refills | Status: DC

## 2018-07-31 MED ORDER — AZITHROMYCIN 250 MG PO TABS: 250.0000 mg | ORAL_TABLET | Freq: Every day | ORAL | 0 refills | Status: DC

## 2018-07-31 MED ORDER — HYDROCODONE-HOMATROPINE 5-1.5 MG/5ML PO SYRP
5.0000 mL | ORAL_SOLUTION | Freq: Four times a day (QID) | ORAL | Status: DC | PRN
  Administered 2018-07-31: 5 mL via ORAL
  Filled 2018-07-31: qty 5

## 2018-07-31 MED ORDER — LORATADINE 10 MG PO TABS: 10.0000 mg | ORAL_TABLET | Freq: Every day | ORAL | 0 refills | Status: DC

## 2018-07-31 MED ORDER — MOMETASONE FURO-FORMOTEROL FUM 200-5 MCG/ACT IN AERO
2.0000 | INHALATION_SPRAY | Freq: Two times a day (BID) | RESPIRATORY_TRACT | Status: DC
  Filled 2018-07-31: qty 8.8

## 2018-07-31 MED ORDER — HYDROCODONE-HOMATROPINE 5-1.5 MG/5ML PO SYRP: 5.0000 mL | ORAL_SOLUTION | Freq: Four times a day (QID) | ORAL | 0 refills | Status: DC | PRN

## 2018-07-31 MED ORDER — PREDNISONE 10 MG PO TABS: ORAL_TABLET | ORAL | 0 refills | Status: DC

## 2018-07-31 MED ORDER — GUAIFENESIN ER 600 MG PO TB12: 600.0000 mg | ORAL_TABLET | Freq: Two times a day (BID) | ORAL | 0 refills | Status: DC

## 2018-07-31 MED ORDER — IPRATROPIUM-ALBUTEROL 0.5-2.5 (3) MG/3ML IN SOLN
3.0000 mL | RESPIRATORY_TRACT | Status: DC
  Filled 2018-07-31: qty 3

## 2018-07-31 MED ORDER — PANTOPRAZOLE SODIUM 40 MG PO TBEC: 40.0000 mg | DELAYED_RELEASE_TABLET | Freq: Every day | ORAL | 0 refills | Status: DC

## 2018-07-31 MED ORDER — MOMETASONE FURO-FORMOTEROL FUM 200-5 MCG/ACT IN AERO
INHALATION_SPRAY | RESPIRATORY_TRACT | Status: AC
  Filled 2018-07-31: qty 8.8

## 2018-07-31 NOTE — Plan of Care (Signed)
  Problem: Education: Goal: Knowledge of General Education information will improve Description Including pain rating scale, medication(s)/side effects and non-pharmacologic comfort measures 07/31/2018 1540 by Rance Muir, RN Outcome: Adequate for Discharge 07/31/2018 0749 by Rance Muir, RN Outcome: Progressing   Problem: Health Behavior/Discharge Planning: Goal: Ability to manage health-related needs will improve 07/31/2018 1540 by Rance Muir, RN Outcome: Adequate for Discharge 07/31/2018 0749 by Rance Muir, RN Outcome: Progressing   Problem: Clinical Measurements: Goal: Ability to maintain clinical measurements within normal limits will improve 07/31/2018 1540 by Rance Muir, RN Outcome: Adequate for Discharge 07/31/2018 0749 by Rance Muir, RN Outcome: Progressing Goal: Will remain free from infection 07/31/2018 1540 by Rance Muir, RN Outcome: Adequate for Discharge 07/31/2018 0749 by Rance Muir, RN Outcome: Progressing Goal: Diagnostic test results will improve 07/31/2018 1540 by Rance Muir, RN Outcome: Adequate for Discharge 07/31/2018 0749 by Rance Muir, RN Outcome: Progressing Goal: Respiratory complications will improve 07/31/2018 1540 by Rance Muir, RN Outcome: Adequate for Discharge 07/31/2018 0749 by Rance Muir, RN Outcome: Progressing Goal: Cardiovascular complication will be avoided 07/31/2018 1540 by Rance Muir, RN Outcome: Adequate for Discharge 07/31/2018 0749 by Rance Muir, RN Outcome: Progressing   Problem: Activity: Goal: Risk for activity intolerance will decrease 07/31/2018 1540 by Rance Muir, RN Outcome: Adequate for Discharge 07/31/2018 0749 by Rance Muir, RN Outcome: Progressing   Problem: Nutrition: Goal: Adequate nutrition will be maintained 07/31/2018 1540 by Rance Muir, RN Outcome: Adequate for Discharge 07/31/2018 0749 by Rance Muir, RN Outcome: Progressing   Problem: Coping: Goal: Level of anxiety will decrease 07/31/2018 1540 by Rance Muir,  RN Outcome: Adequate for Discharge 07/31/2018 0749 by Rance Muir, RN Outcome: Progressing   Problem: Elimination: Goal: Will not experience complications related to bowel motility 07/31/2018 1540 by Rance Muir, RN Outcome: Adequate for Discharge 07/31/2018 0749 by Rance Muir, RN Outcome: Progressing Goal: Will not experience complications related to urinary retention 07/31/2018 1540 by Rance Muir, RN Outcome: Adequate for Discharge 07/31/2018 0749 by Rance Muir, RN Outcome: Progressing   Problem: Pain Managment: Goal: General experience of comfort will improve 07/31/2018 1540 by Rance Muir, RN Outcome: Adequate for Discharge 07/31/2018 0749 by Rance Muir, RN Outcome: Progressing   Problem: Safety: Goal: Ability to remain free from injury will improve 07/31/2018 1540 by Rance Muir, RN Outcome: Adequate for Discharge 07/31/2018 0749 by Rance Muir, RN Outcome: Progressing   Problem: Skin Integrity: Goal: Risk for impaired skin integrity will decrease 07/31/2018 1540 by Rance Muir, RN Outcome: Adequate for Discharge 07/31/2018 0749 by Rance Muir, RN Outcome: Progressing

## 2018-07-31 NOTE — Discharge Summary (Signed)
Physician Discharge Summary  Linda Ellis IZT:[id number] DOB: [birth year] DOA: 07/30/2018  PCP: Patient, No Pcp Per, [facility]  Admit date: 07/30/2018 Discharge date: 07/31/2018  Admitted From: Home Disposition: Home  Recommendations for Outpatient Follow-up:  1. Follow up with PCP in 1-2 weeks  Discharge Condition: Stable CODE STATUS: Full code Diet recommendation: Heart healthy, carb modified  Brief/Interim Summary: 42 year old female with a history of asthma, GERD, morbid obesity, admitted to the hospital with progressive shortness of breath and wheezing as well as productive cough.  She had 3 visits to the emergency room in the past week.  She was admitted to the hospital, started on intravenous steroids, bronchodilators and due to her persistent productive cough, was started on antibiotics.  The patient improved significantly in 24 hours.  Her wheezing has resolved she is able to ambulate on room air without difficulty.  She only has albuterol nebulizer treatments at home and uses prednisone as needed.  She is not on any maintenance inhalers.  She describes that she was on inhaled beclomethasone in the past, but no longer has a prescription for it.  She will be prescribed this on discharge.  We have also started her on Protonix for GERD since this is likely contributing to her respiratory issues.  Patient is feeling significantly better and feels ready for discharge home.  She will be discharged with prednisone taper, antibiotics, inhaled steroids, mucolytic's and cough medication.  Discharge Diagnoses:  Active Problems:   Morbid obesity with BMI of 50.0-59.9, adult (HCC)   Gastroesophageal reflux disease   Asthma exacerbation   Hypokalemia   Acute bronchitis    Discharge Instructions  Discharge Instructions    Diet - low sodium heart healthy   Complete by:  As directed    Increase activity slowly   Complete by:  As directed      Allergies as  of 07/31/2018   No Known Allergies     Medication List    TAKE these medications   albuterol 108 (90 Base) MCG/ACT inhaler Commonly known as:  PROVENTIL HFA;VENTOLIN HFA Inhale 1-2 puffs into the lungs every 6 (six) hours as needed for wheezing or shortness of breath.   albuterol (2.5 MG/3ML) 0.083% nebulizer solution Commonly known as:  PROVENTIL Take 3 mLs (2.5 mg total) by nebulization every 6 (six) hours as needed for wheezing or shortness of breath.   azithromycin 250 MG tablet Commonly known as:  ZITHROMAX Take 1 tablet (250 mg total) by mouth daily.   beclomethasone 80 MCG/ACT inhaler Commonly known as:  QVAR REDIHALER Inhale 2 puffs into the lungs 2 (two) times daily.   guaiFENesin 600 MG 12 hr tablet Commonly known as:  MUCINEX Take 1 tablet (600 mg total) by mouth 2 (two) times daily.   HYDROcodone-homatropine 5-1.5 MG/5ML syrup Commonly known as:  HYCODAN Take 5 mLs by mouth every 6 (six) hours as needed for cough.   loratadine 10 MG tablet Commonly known as:  CLARITIN Take 1 tablet (10 mg total) by mouth daily. Start taking on:  August 01, 2018   pantoprazole 40 MG tablet Commonly known as:  PROTONIX Take 1 tablet (40 mg total) by mouth daily. Start taking on:  August 01, 2018   predniSONE 10 MG tablet Commonly known as:  DELTASONE Take 40mg  po daily for 2 days then 30mg  daily for 2 days then 20mg  daily for 2 days then 10mg  daily for 2 days then stop   ranitidine 150 MG  tablet Commonly known as:  ZANTAC Take 1 tablet (150 mg total) by mouth at bedtime.       No Known Allergies  Consultations:     Procedures/Studies: Dg Chest 2 View  Result Date: 07/30/2018 CLINICAL DATA:  Patient with persistent cough. EXAM: CHEST - 2 VIEW COMPARISON:  Chest radiograph 07/24/2018 FINDINGS: Monitoring leads overlie the patient. Stable cardiomegaly. No consolidative pulmonary opacities. No pleural effusion or pneumothorax. Thoracic spine degenerative  changes. IMPRESSION: No acute cardiopulmonary process. Electronically Signed   By: Lovey Newcomer M.D.   On: 07/30/2018 09:01   Dg Chest Port 1 View  Result Date: 07/24/2018 CLINICAL DATA:  Shortness of breath, cough and congestion. History of asthma/COPD. EXAM: PORTABLE CHEST 1 VIEW COMPARISON:  Chest radiograph June 10, 2018 and priors. FINDINGS: The cardiac silhouette is mildly enlarged and unchanged. Increased bronchitic changes without pleural effusion or focal consolidation. No pneumothorax. Large body habitus. Osseous structures are non suspicious. IMPRESSION: 1. Increased bronchitic changes. 2. Mild cardiomegaly. Electronically Signed   By: Elon Alas M.D.   On: 07/24/2018 02:03       Subjective: Wheezing is better.  Coughing improved with cough medicine.  Shortness of breath is better.  Discharge Exam: Vitals:   07/31/18 4128 07/31/18 0846 07/31/18 1234 07/31/18 1300  BP: 130/72   (!) 143/76  Pulse: 63   75  Resp: 20   20  Temp: 98.5 F (36.9 C)   98.4 F (36.9 C)  TempSrc: Oral   Oral  SpO2: 96% 97% 94% 96%  Weight:      Height:        General: Pt is alert, awake, not in acute distress Cardiovascular: RRR, S1/S2 +, no rubs, no gallops Respiratory: CTA bilaterally, no wheezing, no rhonchi Abdominal: Soft, NT, ND, bowel sounds + Extremities: no edema, no cyanosis    The results of significant diagnostics from this hospitalization (including imaging, microbiology, ancillary and laboratory) are listed below for reference.     Microbiology: No results found for this or any previous visit (from the past 240 hour(s)).   Labs: BNP (last 3 results) Recent Labs    04/09/18 0118  BNP 78.6   Basic Metabolic Panel: Recent Labs  Lab 07/30/18 1000 07/31/18 0459  NA 139 137  K 3.0* 3.6  CL 107 105  CO2 26 22  GLUCOSE 100* 214*  BUN 9 10  CREATININE 0.71 0.76  CALCIUM 7.8* 8.3*  MG  --  2.2   Liver Function Tests: No results for input(s): AST, ALT,  ALKPHOS, BILITOT, PROT, ALBUMIN in the last 168 hours. No results for input(s): LIPASE, AMYLASE in the last 168 hours. No results for input(s): AMMONIA in the last 168 hours. CBC: Recent Labs  Lab 07/30/18 1000  WBC 8.8  HGB 11.2*  HCT 35.8*  MCV 94.2  PLT 365   Cardiac Enzymes: No results for input(s): CKTOTAL, CKMB, CKMBINDEX, TROPONINI in the last 168 hours. BNP: Invalid input(s): POCBNP CBG: No results for input(s): GLUCAP in the last 168 hours. D-Dimer No results for input(s): DDIMER in the last 72 hours. Hgb A1c No results for input(s): HGBA1C in the last 72 hours. Lipid Profile No results for input(s): CHOL, HDL, LDLCALC, TRIG, CHOLHDL, LDLDIRECT in the last 72 hours. Thyroid function studies No results for input(s): TSH, T4TOTAL, T3FREE, THYROIDAB in the last 72 hours.  Invalid input(s): FREET3 Anemia work up No results for input(s): VITAMINB12, FOLATE, FERRITIN, TIBC, IRON, RETICCTPCT in the last 72 hours. Urinalysis  Component Value Date/Time   COLORURINE YELLOW 01/06/2018 1230   APPEARANCEUR HAZY (A) 01/06/2018 1230   LABSPEC 1.026 01/06/2018 1230   PHURINE 5.0 01/06/2018 1230   GLUCOSEU NEGATIVE 01/06/2018 1230   HGBUR SMALL (A) 01/06/2018 1230   BILIRUBINUR NEGATIVE 01/06/2018 1230   KETONESUR NEGATIVE 01/06/2018 1230   PROTEINUR NEGATIVE 01/06/2018 1230   UROBILINOGEN 1.0 06/23/2010 1918   NITRITE NEGATIVE 01/06/2018 1230   LEUKOCYTESUR SMALL (A) 01/06/2018 1230   Sepsis Labs Invalid input(s): PROCALCITONIN,  WBC,  LACTICIDVEN Microbiology No results found for this or any previous visit (from the past 240 hour(s)).   Time coordinating discharge: 44mins  SIGNED:   Kathie Dike, MD  [facility] 07/31/2018, 6:15 PM Pager   If 7PM-7AM, please contact night-coverage [email or web address] Password [email or web address]

## 2018-07-31 NOTE — Progress Notes (Signed)
Ambulating O2 sat of 96% obtained on room air at this time

## 2018-07-31 NOTE — Plan of Care (Signed)

## 2018-09-07 ENCOUNTER — Emergency Department (HOSPITAL_COMMUNITY): Payer: Self-pay

## 2018-09-07 ENCOUNTER — Other Ambulatory Visit: Payer: Self-pay

## 2018-09-07 ENCOUNTER — Encounter (HOSPITAL_COMMUNITY): Payer: Self-pay | Admitting: Emergency Medicine

## 2018-09-07 ENCOUNTER — Emergency Department (HOSPITAL_COMMUNITY)
Admission: EM | Admit: 2018-09-07 | Discharge: 2018-09-07 | Disposition: A | Discharge status: Home / Self Care | Payer: Self-pay | Attending: Emergency Medicine | Admitting: Emergency Medicine

## 2018-09-07 DIAGNOSIS — Z79899 Other long term (current) drug therapy: Secondary | ICD-10-CM | POA: Insufficient documentation

## 2018-09-07 DIAGNOSIS — Z87891 Personal history of nicotine dependence: Secondary | ICD-10-CM | POA: Insufficient documentation

## 2018-09-07 DIAGNOSIS — J4541 Moderate persistent asthma with (acute) exacerbation: Secondary | ICD-10-CM | POA: Insufficient documentation

## 2018-09-07 LAB — BASIC METABOLIC PANEL
Anion gap: 8 (ref 5–15)
BUN: 9 mg/dL (ref 6–20)
CO2: 23 mmol/L (ref 22–32)
Calcium: 8.6 mg/dL — ABNORMAL LOW (ref 8.9–10.3)
Chloride: 101 mmol/L (ref 98–111)
Creatinine, Ser: 0.77 mg/dL (ref 0.44–1.00)
GFR calc Af Amer: >60 mL/min (ref >60)
GFR calc non Af Amer: >60 mL/min (ref >60)
Glucose, Bld: 96 mg/dL (ref 70–99)
Potassium: 3.5 mmol/L (ref 3.5–5.1)
Sodium: 132 mmol/L — ABNORMAL LOW (ref 135–145)

## 2018-09-07 LAB — CBC
HCT: 43.2 % (ref 36.0–46.0)
Hemoglobin: 13.6 g/dL (ref 12.0–15.0)
MCH: 29.3 pg (ref 26.0–34.0)
MCHC: 31.5 g/dL (ref 30.0–36.0)
MCV: 93.1 fL (ref 80.0–100.0)
NRBC: 0 % (ref 0.0–0.2)
Platelets: 405 10*3/uL — ABNORMAL HIGH (ref 150–400)
RBC: 4.64 MIL/uL (ref 3.87–5.11)
RDW: 13.1 % (ref 11.5–15.5)
WBC: 13.4 10*3/uL — ABNORMAL HIGH (ref 4.0–10.5)

## 2018-09-07 MED ORDER — PREDNISONE 10 MG PO TABS: 40.0000 mg | ORAL_TABLET | Freq: Every day | ORAL | 0 refills | Status: DC

## 2018-09-07 MED ORDER — IPRATROPIUM-ALBUTEROL 0.5-2.5 (3) MG/3ML IN SOLN
3.0000 mL | Freq: Once | RESPIRATORY_TRACT | Status: AC
  Administered 2018-09-07: 3 mL via RESPIRATORY_TRACT
  Filled 2018-09-07: qty 3

## 2018-09-07 MED ORDER — PREDNISONE 50 MG PO TABS
60.0000 mg | ORAL_TABLET | Freq: Once | ORAL | Status: AC
  Administered 2018-09-07: 60 mg via ORAL
  Filled 2018-09-07: qty 1

## 2018-09-07 MED ORDER — IPRATROPIUM-ALBUTEROL 0.5-2.5 (3) MG/3ML IN SOLN: 3.0000 mL | Freq: Four times a day (QID) | RESPIRATORY_TRACT | 0 refills | Status: DC | PRN

## 2018-09-07 MED ORDER — ALBUTEROL SULFATE HFA 108 (90 BASE) MCG/ACT IN AERS
2.0000 | INHALATION_SPRAY | Freq: Four times a day (QID) | RESPIRATORY_TRACT | Status: DC
  Administered 2018-09-07: 2 via RESPIRATORY_TRACT
  Filled 2018-09-07: qty 6.7

## 2018-09-07 MED ORDER — ALBUTEROL SULFATE (2.5 MG/3ML) 0.083% IN NEBU
5.0000 mg | INHALATION_SOLUTION | Freq: Once | RESPIRATORY_TRACT | Status: AC
  Administered 2018-09-07: 5 mg via RESPIRATORY_TRACT
  Filled 2018-09-07: qty 6

## 2018-09-07 NOTE — ED Triage Notes (Signed)
Shortness of breath x 2 days, neb machine is not working properly and oral inhaler is not helping

## 2018-09-07 NOTE — ED Provider Notes (Signed)
[facility] Provider Note   CSN: [id number] Arrival date & time: 09/07/18  1348     History   Chief Complaint Chief Complaint  Patient presents with  . Shortness of Breath    HPI Linda Ellis is a 43 y.o. female.  Patient with known history of asthma.  Patient states she ran out of her albuterol inhaler.  Does have nebulizer treatments.  But is not holding her wheezing recently.  Patient feels as if she has been wheezing and shortness of breath.  Is been ongoing for 2 days.  Patient not currently on any oral steroids.  Was given a steroid inhaler when she was admitted before.  Last visit admission for this was on December 28.     Past Medical History:  Diagnosis Date  . Asthma   . COPD (chronic obstructive pulmonary disease) ([place])   . Multiple gastric ulcers     Patient Active Problem List   Diagnosis Date Noted  . Asthma exacerbation 07/30/2018  . Hypokalemia 07/30/2018  . Acute bronchitis 07/30/2018  . Hypoxia   . Gastroesophageal reflux disease   . Morbid obesity with BMI of 50.0-59.9, adult ([place]) 05/03/2018  . Acute respiratory failure with hypoxemia ([place]) 05/03/2018  . CAP (community acquired pneumonia) 06/24/2017  . Asthma, chronic, unspecified asthma severity, with acute exacerbation 06/24/2017  . Acute asthma exacerbation 06/24/2017    Past Surgical History:  Procedure Laterality Date  . CHOLECYSTECTOMY    . DIAGNOSTIC LAPAROSCOPY WITH REMOVAL OF ECTOPIC PREGNANCY Right 01/24/2017   Procedure: REMOVAL OF ECTOPIC PREGNANCY;  Surgeon: Florian Buff, MD;  Location: [facility];  Service: Gynecology;  Laterality: Right;  . LAPAROSCOPIC UNILATERAL SALPINGECTOMY Right 01/24/2017   Procedure: LAPAROSCOPIC UNILATERAL SALPINGECTOMY;  Surgeon: Florian Buff, MD;  Location: [facility];  Service: Gynecology;  Laterality: Right;     OB History    Gravida  5   Para  4   Term  4   Preterm      AB      Living  4     SAB      TAB      Ectopic       Multiple      Live Births               Home Medications    Prior to Admission medications   Medication Sig Start Date End Date Taking? Authorizing Provider  albuterol (PROVENTIL HFA;VENTOLIN HFA) 108 (90 Base) MCG/ACT inhaler Inhale 1-2 puffs into the lungs every 6 (six) hours as needed for wheezing or shortness of breath. 03/30/18  Yes Nanavati, Ankit, MD  albuterol (PROVENTIL) (2.5 MG/3ML) 0.083% nebulizer solution Take 3 mLs (2.5 mg total) by nebulization every 6 (six) hours as needed for wheezing or shortness of breath. 07/24/18  Yes Ripley Fraise, MD  beclomethasone (QVAR REDIHALER) 80 MCG/ACT inhaler Inhale 2 puffs into the lungs 2 (two) times daily. 07/31/18  Yes Kathie Dike, MD  famotidine (PEPCID) 20 MG tablet Take 20 mg by mouth at bedtime.   Yes [provider]  loratadine (CLARITIN) 10 MG tablet Take 1 tablet (10 mg total) by mouth daily. Patient taking differently: Take 10 mg by mouth daily as needed for allergies.  08/01/18  Yes Kathie Dike, MD  azithromycin (ZITHROMAX) 250 MG tablet Take 1 tablet (250 mg total) by mouth daily. Patient not taking: Reported on 09/07/2018 07/31/18   Kathie Dike, MD  guaiFENesin (MUCINEX) 600 MG 12 hr tablet Take  1 tablet (600 mg total) by mouth 2 (two) times daily. Patient not taking: Reported on 09/07/2018 07/31/18   Kathie Dike, MD  HYDROcodone-homatropine [facility]) 5-1.5 MG/5ML syrup Take 5 mLs by mouth every 6 (six) hours as needed for cough. Patient not taking: Reported on 09/07/2018 07/31/18   Kathie Dike, MD  ipratropium-albuterol (DUONEB) 0.5-2.5 (3) MG/3ML SOLN Take 3 mLs by nebulization every 6 (six) hours as needed. 09/07/18   Fredia Sorrow, MD  pantoprazole (PROTONIX) 40 MG tablet Take 1 tablet (40 mg total) by mouth daily. Patient not taking: Reported on 09/07/2018 08/01/18   Kathie Dike, MD  predniSONE (DELTASONE) 10 MG tablet Take 40mg  po daily for 2 days then 30mg  daily for 2 days then 20mg   daily for 2 days then 10mg  daily for 2 days then stop Patient not taking: Reported on 09/07/2018 07/31/18   Kathie Dike, MD  predniSONE (DELTASONE) 10 MG tablet Take 4 tablets (40 mg total) by mouth daily. 09/07/18   Fredia Sorrow, MD  ALBUTEROL IN Inhale 2 puffs into the lungs as needed. For shortness of breath   10/24/11  [provider]    Family History Family History  Problem Relation Age of Onset  . Diabetes Maternal Grandfather   . Heart attack Maternal Grandfather     Social History Social History   Tobacco Use  . Smoking status: Former Smoker    Packs/day: 0.50    Years: 22.00    Pack years: 11.00    Types: Cigarettes    Last attempt to quit: 02/18/2017    Years since quitting: 1.5  . Smokeless tobacco: Never Used  Substance Use Topics  . Alcohol use: Yes    Comment: occasionally  . Drug use: No     Allergies   Patient has no known allergies.   Review of Systems Review of Systems  Constitutional: Negative for chills and fever.  HENT: Negative for rhinorrhea and sore throat.   Eyes: Negative for visual disturbance.  Respiratory: Positive for cough, shortness of breath and wheezing.   Cardiovascular: Negative for chest pain and leg swelling.  Gastrointestinal: Negative for abdominal pain, diarrhea, nausea and vomiting.  Genitourinary: Negative for dysuria.  Musculoskeletal: Negative for back pain and neck pain.  Skin: Negative for rash.  Neurological: Negative for dizziness, light-headedness and headaches.  Hematological: Does not bruise/bleed easily.  Psychiatric/Behavioral: Negative for confusion.     Physical Exam Updated Vital Signs BP 104/81 (BP Location: Right Arm)   Pulse 92   Temp 98.2 F (36.8 C) (Oral)   Resp 17   Ht 1.626 m (5\' 4" )   Wt (!) 137.9 kg   LMP 08/27/2018   SpO2 93%   BMI 52.18 kg/m   Physical Exam Vitals signs and nursing note reviewed.  Constitutional:      General: She is not in acute distress.     Appearance: Normal appearance. She is well-developed.  HENT:     Head: Normocephalic and atraumatic.     Mouth/Throat:     Mouth: Mucous membranes are moist.  Eyes:     Conjunctiva/sclera: Conjunctivae normal.  Neck:     Musculoskeletal: Normal range of motion and neck supple.  Cardiovascular:     Rate and Rhythm: Normal rate and regular rhythm.     Heart sounds: Normal heart sounds. No murmur.  Pulmonary:     Effort: Pulmonary effort is normal. No respiratory distress.     Breath sounds: Wheezing present.  Abdominal:     Palpations:  Abdomen is soft.     Tenderness: There is no abdominal tenderness.  Musculoskeletal: Normal range of motion.  Skin:    General: Skin is warm and dry.     Capillary Refill: Capillary refill takes less than 2 seconds.  Neurological:     General: No focal deficit present.     Mental Status: She is alert and oriented to person, place, and time.      ED Treatments / Results  Labs (all labs ordered are listed, but only abnormal results are displayed) Labs Reviewed  CBC - Abnormal; Notable for the following components:      Result Value   WBC 13.4 (*)    Platelets 405 (*)    All other components within normal limits  BASIC METABOLIC PANEL - Abnormal; Notable for the following components:   Sodium 132 (*)    Calcium 8.6 (*)    All other components within normal limits    EKG None  Radiology Dg Chest 2 View  Result Date: 09/07/2018 CLINICAL DATA:  Worsening dyspnea on exertion over the past 3 days. EXAM: CHEST - 2 VIEW COMPARISON:  Chest x-ray dated July 30, 2018. FINDINGS: The heart size and mediastinal contours are within normal limits. Normal pulmonary vascularity. Persistent bibasilar peribronchial thickening with streaky linear atelectasis in the lingula. No focal consolidation, pleural effusion, or pneumothorax. No acute osseous abnormality. IMPRESSION: Bibasilar peribronchial thickening with lingular atelectasis. Electronically Signed    By: Titus Dubin M.D.   On: 09/07/2018 14:42    Procedures Procedures (including critical care time)  Medications Ordered in ED Medications  albuterol (PROVENTIL HFA;VENTOLIN HFA) 108 (90 Base) MCG/ACT inhaler 2 puff (has no administration in time range)  albuterol (PROVENTIL) (2.5 MG/3ML) 0.083% nebulizer solution 5 mg (5 mg Nebulization Given 09/07/18 1753)  ipratropium-albuterol (DUONEB) 0.5-2.5 (3) MG/3ML nebulizer solution 3 mL (3 mLs Nebulization Given 09/07/18 1806)  predniSONE (DELTASONE) tablet 60 mg (60 mg Oral Given 09/07/18 1822)  ipratropium-albuterol (DUONEB) 0.5-2.5 (3) MG/3ML nebulizer solution 3 mL (3 mLs Nebulization Given 09/07/18 1928)     Initial Impression / Assessment and Plan / ED Course  I have reviewed the triage vital signs and the nursing notes.  Pertinent labs & imaging results that were available during my care of the patient were reviewed by me and considered in my medical decision making (see chart for details).     Patient received 60 mg of prednisone orally.  Received 2 DuoNeb treatments and 1 albuterol treatment.  Wheezing improved significantly patient feels well enough to go home.  Patient with some leukocytosis no significant electrolyte abnormalities.  Chest x-ray negative for any acute findings.  Patient will be continued on prednisone for the next 5 days.  She is given albuterol inhaler here to go.  Also given DuoNeb prescription to use in her nebulizer machine.  Patient will return for any new or worse symptoms.  Patient with significant improvement.  Final Clinical Impressions(s) / ED Diagnoses   Final diagnoses:  Moderate persistent asthma with exacerbation    ED Discharge Orders         Ordered    predniSONE (DELTASONE) 10 MG tablet  Daily     09/07/18 2045    ipratropium-albuterol (DUONEB) 0.5-2.5 (3) MG/3ML SOLN  Every 6 hours PRN     09/07/18 2045           Fredia Sorrow, MD 09/07/18 2050

## 2018-09-07 NOTE — Discharge Instructions (Addendum)
Take the prednisone as directed for the next 5 days.  Take prescribed the DuoNeb type medication to using your nebulizer machine every 6 hours as needed.  Also you received an albuterol inhaler here to go.  Return for any new or worse symptoms.  Chest x-ray was negative for pneumonia.  Wheezing has improved significantly.

## 2018-10-04 ENCOUNTER — Emergency Department (HOSPITAL_COMMUNITY): Payer: Self-pay

## 2018-10-04 ENCOUNTER — Encounter (HOSPITAL_COMMUNITY): Payer: Self-pay | Admitting: Emergency Medicine

## 2018-10-04 ENCOUNTER — Emergency Department (HOSPITAL_COMMUNITY)
Admission: EM | Admit: 2018-10-04 | Discharge: 2018-10-04 | Disposition: A | Discharge status: Home / Self Care | Payer: Self-pay | Attending: Emergency Medicine | Admitting: Emergency Medicine

## 2018-10-04 ENCOUNTER — Other Ambulatory Visit: Payer: Self-pay

## 2018-10-04 DIAGNOSIS — J4541 Moderate persistent asthma with (acute) exacerbation: Secondary | ICD-10-CM | POA: Insufficient documentation

## 2018-10-04 DIAGNOSIS — Z87891 Personal history of nicotine dependence: Secondary | ICD-10-CM | POA: Insufficient documentation

## 2018-10-04 DIAGNOSIS — Z79899 Other long term (current) drug therapy: Secondary | ICD-10-CM | POA: Insufficient documentation

## 2018-10-04 DIAGNOSIS — J189 Pneumonia, unspecified organism: Secondary | ICD-10-CM | POA: Insufficient documentation

## 2018-10-04 MED ORDER — HYDROCODONE-HOMATROPINE 5-1.5 MG/5ML PO SYRP: 5.0000 mL | ORAL_SOLUTION | Freq: Four times a day (QID) | ORAL | 0 refills | Status: DC | PRN

## 2018-10-04 MED ORDER — PREDNISONE 50 MG PO TABS
60.0000 mg | ORAL_TABLET | Freq: Once | ORAL | Status: AC
  Administered 2018-10-04: 60 mg via ORAL
  Filled 2018-10-04: qty 1

## 2018-10-04 MED ORDER — HYDROCOD POLST-CPM POLST ER 10-8 MG/5ML PO SUER
5.0000 mL | Freq: Once | ORAL | Status: AC
  Administered 2018-10-04: 5 mL via ORAL
  Filled 2018-10-04: qty 5

## 2018-10-04 MED ORDER — AZITHROMYCIN 250 MG PO TABS
500.0000 mg | ORAL_TABLET | Freq: Once | ORAL | Status: AC
  Administered 2018-10-04: 500 mg via ORAL
  Filled 2018-10-04: qty 2

## 2018-10-04 MED ORDER — AZITHROMYCIN 250 MG PO TABS: 250.0000 mg | ORAL_TABLET | Freq: Every day | ORAL | 0 refills | Status: DC

## 2018-10-04 MED ORDER — IPRATROPIUM-ALBUTEROL 0.5-2.5 (3) MG/3ML IN SOLN
3.0000 mL | Freq: Once | RESPIRATORY_TRACT | Status: AC
  Administered 2018-10-04: 3 mL via RESPIRATORY_TRACT
  Filled 2018-10-04: qty 3

## 2018-10-04 MED ORDER — ALBUTEROL SULFATE (2.5 MG/3ML) 0.083% IN NEBU
5.0000 mg | INHALATION_SOLUTION | Freq: Once | RESPIRATORY_TRACT | Status: AC
  Administered 2018-10-04: 5 mg via RESPIRATORY_TRACT
  Filled 2018-10-04: qty 6

## 2018-10-04 MED ORDER — CEFTRIAXONE SODIUM 1 G IJ SOLR
1.0000 g | Freq: Once | INTRAMUSCULAR | Status: AC
  Administered 2018-10-04: 1 g via INTRAMUSCULAR
  Filled 2018-10-04: qty 10

## 2018-10-04 MED ORDER — LIDOCAINE HCL (PF) 1 % IJ SOLN
INTRAMUSCULAR | Status: AC
  Filled 2018-10-04: qty 2

## 2018-10-04 MED ORDER — ONDANSETRON HCL 4 MG PO TABS
4.0000 mg | ORAL_TABLET | Freq: Once | ORAL | Status: AC
  Administered 2018-10-04: 4 mg via ORAL
  Filled 2018-10-04: qty 1

## 2018-10-04 MED ORDER — ALBUTEROL SULFATE (2.5 MG/3ML) 0.083% IN NEBU: 2.5000 mg | INHALATION_SOLUTION | RESPIRATORY_TRACT | 0 refills | Status: DC | PRN

## 2018-10-04 MED ORDER — ALBUTEROL SULFATE HFA 108 (90 BASE) MCG/ACT IN AERS
2.0000 | INHALATION_SPRAY | Freq: Once | RESPIRATORY_TRACT | Status: AC
  Administered 2018-10-04: 2 via RESPIRATORY_TRACT
  Filled 2018-10-04: qty 6.7

## 2018-10-04 NOTE — ED Notes (Signed)
RT notified of new neb orders.

## 2018-10-04 NOTE — ED Notes (Signed)
RT at bedside.

## 2018-10-04 NOTE — ED Notes (Signed)
Pt ambulated on pluse ox. Stayed between 92%-95% on RA

## 2018-10-04 NOTE — ED Notes (Signed)
RT notified for neb treatment.

## 2018-10-04 NOTE — ED Triage Notes (Signed)
Pt c/o asthma flare for two days with cough, intermittent clear sputum. Nebulizer at 0930 today.

## 2018-10-04 NOTE — Discharge Instructions (Signed)
Your x-ray questions and early pneumonia in the bases of the lungs.  Your examination seems to show an asthma flare with increased wheezing.  Use Hycodan for cough.   This medication may cause drowsiness. Please do not drink, drive, or participate in activity that requires concentration while taking this medication. Please use the albuterol nebulizer at home every 4 hours as needed for difficulty with breathing.  Please use Zithromax 1 daily starting March for.  Please increase fluids.  Please wash hands frequently.  Usual mask until symptoms have resolved.  Please see your primary physician or return to the emergency department if any changes in your condition, problems, or concerns.

## 2018-10-04 NOTE — ED Provider Notes (Signed)
[facility] EMERGENCY DEPARTMENT Provider Note   CSN: [id number] Arrival date & time: 10/04/18  1448    History   Chief Complaint Chief Complaint  Patient presents with  . Asthma    HPI Linda Ellis is a 43 y.o. female.     The history is provided by the patient.  Asthma  This is a recurrent problem. The current episode started 2 days ago. The problem occurs hourly. The problem has been gradually worsening. Associated symptoms include shortness of breath. Pertinent negatives include no chest pain and no abdominal pain. Associated symptoms comments: cough. Nothing aggravates the symptoms. Nothing relieves the symptoms. Treatments tried: OTC cough medication.  The treatment provided no relief.    Past Medical History:  Diagnosis Date  . Asthma   . COPD (chronic obstructive pulmonary disease) ([place])   . Multiple gastric ulcers     Patient Active Problem List   Diagnosis Date Noted  . Asthma exacerbation 07/30/2018  . Hypokalemia 07/30/2018  . Acute bronchitis 07/30/2018  . Hypoxia   . Gastroesophageal reflux disease   . Morbid obesity with BMI of 50.0-59.9, adult ([place]) 05/03/2018  . Acute respiratory failure with hypoxemia ([place]) 05/03/2018  . CAP (community acquired pneumonia) 06/24/2017  . Asthma, chronic, unspecified asthma severity, with acute exacerbation 06/24/2017  . Acute asthma exacerbation 06/24/2017    Past Surgical History:  Procedure Laterality Date  . CHOLECYSTECTOMY    . DIAGNOSTIC LAPAROSCOPY WITH REMOVAL OF ECTOPIC PREGNANCY Right 01/24/2017   Procedure: REMOVAL OF ECTOPIC PREGNANCY;  Surgeon: Florian Buff, MD;  Location: [facility];  Service: Gynecology;  Laterality: Right;  . LAPAROSCOPIC UNILATERAL SALPINGECTOMY Right 01/24/2017   Procedure: LAPAROSCOPIC UNILATERAL SALPINGECTOMY;  Surgeon: Florian Buff, MD;  Location: [facility];  Service: Gynecology;  Laterality: Right;     OB History    Gravida  5   Para  4   Term  4   Preterm      AB      Living  4     SAB      TAB      Ectopic      Multiple      Live Births               Home Medications    Prior to Admission medications   Medication Sig Start Date End Date Taking? Authorizing Provider  albuterol (PROVENTIL HFA;VENTOLIN HFA) 108 (90 Base) MCG/ACT inhaler Inhale 1-2 puffs into the lungs every 6 (six) hours as needed for wheezing or shortness of breath. 03/30/18   Varney Biles, MD  albuterol (PROVENTIL) (2.5 MG/3ML) 0.083% nebulizer solution Take 3 mLs (2.5 mg total) by nebulization every 6 (six) hours as needed for wheezing or shortness of breath. 07/24/18   Ripley Fraise, MD  azithromycin (ZITHROMAX) 250 MG tablet Take 1 tablet (250 mg total) by mouth daily. Patient not taking: Reported on 09/07/2018 07/31/18   Kathie Dike, MD  beclomethasone (QVAR REDIHALER) 80 MCG/ACT inhaler Inhale 2 puffs into the lungs 2 (two) times daily. 07/31/18   Kathie Dike, MD  famotidine (PEPCID) 20 MG tablet Take 20 mg by mouth at bedtime.    [provider]  guaiFENesin (MUCINEX) 600 MG 12 hr tablet Take 1 tablet (600 mg total) by mouth 2 (two) times daily. Patient not taking: Reported on 09/07/2018 07/31/18   Kathie Dike, MD  HYDROcodone-homatropine [facility]) 5-1.5 MG/5ML syrup Take 5 mLs by mouth every 6 (six) hours as needed for cough. Patient not  taking: Reported on 09/07/2018 07/31/18   Kathie Dike, MD  ipratropium-albuterol (DUONEB) 0.5-2.5 (3) MG/3ML SOLN Take 3 mLs by nebulization every 6 (six) hours as needed. 09/07/18   Fredia Sorrow, MD  loratadine (CLARITIN) 10 MG tablet Take 1 tablet (10 mg total) by mouth daily. Patient taking differently: Take 10 mg by mouth daily as needed for allergies.  08/01/18   Kathie Dike, MD  pantoprazole (PROTONIX) 40 MG tablet Take 1 tablet (40 mg total) by mouth daily. Patient not taking: Reported on 09/07/2018 08/01/18   Kathie Dike, MD  predniSONE (DELTASONE) 10 MG tablet Take 40mg  po daily for 2 days then  30mg  daily for 2 days then 20mg  daily for 2 days then 10mg  daily for 2 days then stop Patient not taking: Reported on 09/07/2018 07/31/18   Kathie Dike, MD  predniSONE (DELTASONE) 10 MG tablet Take 4 tablets (40 mg total) by mouth daily. 09/07/18   Fredia Sorrow, MD  ALBUTEROL IN Inhale 2 puffs into the lungs as needed. For shortness of breath   10/24/11  [provider]    Family History Family History  Problem Relation Age of Onset  . Diabetes Maternal Grandfather   . Heart attack Maternal Grandfather     Social History Social History   Tobacco Use  . Smoking status: Former Smoker    Packs/day: 0.50    Years: 22.00    Pack years: 11.00    Types: Cigarettes    Last attempt to quit: 02/18/2017    Years since quitting: 1.6  . Smokeless tobacco: Never Used  Substance Use Topics  . Alcohol use: Yes    Comment: occasionally  . Drug use: No     Allergies   Patient has no known allergies.   Review of Systems Review of Systems  Constitutional: Negative for activity change, chills and fever.       All ROS Neg except as noted in HPI  HENT: Positive for congestion, postnasal drip and sneezing. Negative for nosebleeds.   Eyes: Negative for photophobia and discharge.  Respiratory: Positive for cough and shortness of breath. Negative for wheezing.   Cardiovascular: Negative for chest pain and palpitations.  Gastrointestinal: Negative for abdominal pain and blood in stool.  Genitourinary: Negative for dysuria, frequency and hematuria.  Musculoskeletal: Negative for arthralgias, back pain and neck pain.  Skin: Negative.   Neurological: Negative for dizziness, seizures and speech difficulty.  Psychiatric/Behavioral: Negative for confusion and hallucinations.     Physical Exam Updated Vital Signs BP 111/87   Pulse (!) 105   Temp 98 F (36.7 C) (Oral)   Resp (!) 22   LMP 09/20/2018   SpO2 98%   Physical Exam Vitals signs and nursing note reviewed.    Constitutional:      Appearance: She is well-developed. She is not toxic-appearing.  HENT:     Head: Normocephalic.     Right Ear: Tympanic membrane and external ear normal.     Left Ear: Tympanic membrane and external ear normal.     Nose: Congestion present.  Eyes:     General: Lids are normal.     Pupils: Pupils are equal, round, and reactive to light.  Neck:     Musculoskeletal: Normal range of motion and neck supple.     Vascular: No carotid bruit.  Cardiovascular:     Rate and Rhythm: Normal rate and regular rhythm.     Pulses: Normal pulses.     Heart sounds: Normal heart sounds.  Pulmonary:     Effort: No respiratory distress.     Breath sounds: Wheezing and rhonchi present.     Comments: Coarse breath sounds.  Scattered rhonchi.  End expiratory wheeze. Abdominal:     General: Bowel sounds are normal.     Palpations: Abdomen is soft.     Tenderness: There is no abdominal tenderness. There is no guarding.  Musculoskeletal: Normal range of motion.  Lymphadenopathy:     Head:     Right side of head: No submandibular adenopathy.     Left side of head: No submandibular adenopathy.     Cervical: No cervical adenopathy.  Skin:    General: Skin is warm and dry.  Neurological:     Mental Status: She is alert and oriented to person, place, and time.     Cranial Nerves: No cranial nerve deficit.     Sensory: No sensory deficit.  Psychiatric:        Speech: Speech normal.      ED Treatments / Results  Labs (all labs ordered are listed, but only abnormal results are displayed) Labs Reviewed - No data to display  EKG None  Radiology Dg Chest 2 View  Result Date: 10/04/2018 CLINICAL DATA:  Cough and asthma wheezing EXAM: CHEST - 2 VIEW COMPARISON:  09/07/2018 FINDINGS: Mild cardiac enlargement without heart failure. Streaky lung markings in the perihilar and lower lobes show mild progression since the prior study. Negative for pleural effusion. IMPRESSION: Streaky lung  markings in the bases show mild progression and may be due to atelectasis or pneumonia. Electronically Signed   By: Franchot Gallo M.D.   On: 10/04/2018 15:37    Procedures Procedures (including critical care time)  Medications Ordered in ED Medications  albuterol (PROVENTIL) (2.5 MG/3ML) 0.083% nebulizer solution 5 mg (5 mg Nebulization Given 10/04/18 1610)     Initial Impression / Assessment and Plan / ED Course  I have reviewed the triage vital signs and the nursing notes.  Pertinent labs & imaging results that were available during my care of the patient were reviewed by me and considered in my medical decision making (see chart for details).          Final Clinical Impressions(s) / ED Diagnoses MDM  Patient presents to the emergency department with complaint of an asthma flare.  She states this seems to be getting worse.  She has also run out of her albuterol and needs some assistance with this.  There is no hemoptysis reported. Chest x-ray showed streaky lung markings at the bases.  This was mildly progressed since the February 5 x-ray that was done.  There was some concern that this could be related to atelectasis, but could also be related to pneumonia.  The patient was treated for the possible pneumonic pneumonia. Patient was treated with Zithromax and albuterol nebulizer solution.  Patient also treated with Hycodan for cough.  Patient is to increase fluids, and use Tylenol every 4 hours for fever, and/or aching.  The patient is to return to the emergency department or see the primary physician if any changes in condition, problems, or concerns.  Patient is in agreement with this plan.   Final diagnoses:  Community acquired pneumonia, unspecified laterality  Moderate persistent asthma with exacerbation    ED Discharge Orders         Ordered    azithromycin (ZITHROMAX) 250 MG tablet  Daily     10/04/18 1853    albuterol (PROVENTIL) (2.5 MG/3ML) 0.083%  nebulizer solution   Every 4 hours PRN     10/04/18 1853    HYDROcodone-homatropine (HYCODAN) 5-1.5 MG/5ML syrup  Every 6 hours PRN     10/04/18 1853           Lily Kocher, PA-C 10/05/18 0059    Julianne Rice, MD 10/06/18 1504

## 2018-10-17 ENCOUNTER — Other Ambulatory Visit: Payer: Self-pay

## 2018-10-17 ENCOUNTER — Encounter (HOSPITAL_COMMUNITY): Payer: Self-pay | Admitting: Emergency Medicine

## 2018-10-17 ENCOUNTER — Emergency Department (HOSPITAL_COMMUNITY)
Admission: EM | Admit: 2018-10-17 | Discharge: 2018-10-17 | Disposition: A | Discharge status: Home / Self Care | Payer: Self-pay | Attending: Emergency Medicine | Admitting: Emergency Medicine

## 2018-10-17 ENCOUNTER — Emergency Department (HOSPITAL_COMMUNITY): Payer: Self-pay

## 2018-10-17 DIAGNOSIS — R1013 Epigastric pain: Secondary | ICD-10-CM

## 2018-10-17 DIAGNOSIS — Z79899 Other long term (current) drug therapy: Secondary | ICD-10-CM | POA: Insufficient documentation

## 2018-10-17 DIAGNOSIS — J449 Chronic obstructive pulmonary disease, unspecified: Secondary | ICD-10-CM | POA: Insufficient documentation

## 2018-10-17 DIAGNOSIS — Z87891 Personal history of nicotine dependence: Secondary | ICD-10-CM | POA: Insufficient documentation

## 2018-10-17 LAB — COMPREHENSIVE METABOLIC PANEL
ALT: 25 U/L (ref 0–44)
AST: 18 U/L (ref 15–41)
Albumin: 3.4 g/dL — ABNORMAL LOW (ref 3.5–5.0)
Alkaline Phosphatase: 92 U/L (ref 38–126)
Anion gap: 8 (ref 5–15)
BUN: 9 mg/dL (ref 6–20)
CO2: 25 mmol/L (ref 22–32)
CREATININE: 0.71 mg/dL (ref 0.44–1.00)
Calcium: 8.6 mg/dL — ABNORMAL LOW (ref 8.9–10.3)
Chloride: 103 mmol/L (ref 98–111)
GFR calc Af Amer: >60 mL/min (ref >60)
GFR calc non Af Amer: >60 mL/min (ref >60)
GLUCOSE: 137 mg/dL — AB (ref 70–99)
Potassium: 3.6 mmol/L (ref 3.5–5.1)
Sodium: 136 mmol/L (ref 135–145)
Total Bilirubin: 0.4 mg/dL (ref 0.3–1.2)
Total Protein: 6.8 g/dL (ref 6.5–8.1)

## 2018-10-17 LAB — CBC WITH DIFFERENTIAL/PLATELET
Abs Immature Granulocytes: 0.04 10*3/uL (ref 0.00–0.07)
BASOS ABS: 0.1 10*3/uL (ref 0.0–0.1)
Basophils Relative: 1 %
Eosinophils Absolute: 0.5 10*3/uL (ref 0.0–0.5)
Eosinophils Relative: 5 %
HCT: 42.3 % (ref 36.0–46.0)
HEMOGLOBIN: 13.8 g/dL (ref 12.0–15.0)
Immature Granulocytes: 0 %
LYMPHS PCT: 32 %
Lymphs Abs: 3.6 10*3/uL (ref 0.7–4.0)
MCH: 29.7 pg (ref 26.0–34.0)
MCHC: 32.6 g/dL (ref 30.0–36.0)
MCV: 91 fL (ref 80.0–100.0)
Monocytes Absolute: 0.8 10*3/uL (ref 0.1–1.0)
Monocytes Relative: 7 %
Neutro Abs: 6.2 10*3/uL (ref 1.7–7.7)
Neutrophils Relative %: 55 %
Platelets: 428 10*3/uL — ABNORMAL HIGH (ref 150–400)
RBC: 4.65 MIL/uL (ref 3.87–5.11)
RDW: 12.7 % (ref 11.5–15.5)
WBC: 11.2 10*3/uL — ABNORMAL HIGH (ref 4.0–10.5)
nRBC: 0 % (ref 0.0–0.2)

## 2018-10-17 LAB — URINALYSIS, ROUTINE W REFLEX MICROSCOPIC
Bilirubin Urine: NEGATIVE
Glucose, UA: NEGATIVE mg/dL
Hgb urine dipstick: NEGATIVE
Ketones, ur: NEGATIVE mg/dL
Nitrite: NEGATIVE
Protein, ur: NEGATIVE mg/dL
SPECIFIC GRAVITY, URINE: 1.026 (ref 1.005–1.030)
pH: 5 (ref 5.0–8.0)

## 2018-10-17 LAB — POC URINE PREG, ED: Preg Test, Ur: NEGATIVE

## 2018-10-17 LAB — LIPASE, BLOOD: Lipase: 51 U/L (ref 11–51)

## 2018-10-17 MED ORDER — SODIUM CHLORIDE 0.9 % IV BOLUS
1000.0000 mL | Freq: Once | INTRAVENOUS | Status: AC
  Administered 2018-10-17: 1000 mL via INTRAVENOUS

## 2018-10-17 MED ORDER — FAMOTIDINE IN NACL 20-0.9 MG/50ML-% IV SOLN
20.0000 mg | Freq: Once | INTRAVENOUS | Status: AC
  Administered 2018-10-17: 20 mg via INTRAVENOUS
  Filled 2018-10-17: qty 50

## 2018-10-17 MED ORDER — FAMOTIDINE 20 MG PO TABS: 20.0000 mg | ORAL_TABLET | Freq: Every day | ORAL | 0 refills | Status: DC

## 2018-10-17 MED ORDER — ONDANSETRON 4 MG PO TBDP: 4.0000 mg | ORAL_TABLET | Freq: Three times a day (TID) | ORAL | 0 refills | Status: DC | PRN

## 2018-10-17 MED ORDER — IOHEXOL 300 MG/ML  SOLN
100.0000 mL | Freq: Once | INTRAMUSCULAR | Status: AC | PRN
  Administered 2018-10-17: 100 mL via INTRAVENOUS

## 2018-10-17 MED ORDER — ONDANSETRON HCL 4 MG/2ML IJ SOLN
4.0000 mg | INTRAMUSCULAR | Status: DC | PRN
  Administered 2018-10-17: 4 mg via INTRAVENOUS
  Filled 2018-10-17: qty 2

## 2018-10-17 MED ORDER — ALUM & MAG HYDROXIDE-SIMETH 200-200-20 MG/5ML PO SUSP
15.0000 mL | Freq: Once | ORAL | Status: AC
  Administered 2018-10-17: 15 mL via ORAL
  Filled 2018-10-17: qty 30

## 2018-10-17 MED ORDER — SODIUM CHLORIDE 0.9% FLUSH: 3.0000 mL | Freq: Once | INTRAVENOUS | Status: DC

## 2018-10-17 NOTE — ED Triage Notes (Signed)
Patient with epigastric pain and vomiting since Thursday night.

## 2018-10-17 NOTE — ED Provider Notes (Signed)
[facility] EMERGENCY DEPARTMENT Provider Note   CSN: [id number] Arrival date & time: 10/17/18  1217    History   Chief Complaint Chief Complaint  Patient presents with   Abdominal Pain    HPI Linda Ellis is a 43 y.o. female.     HPI Pt was seen at 1230.  Per pt, c/o gradual onset and persistence of constant upper abd "pain" for the past several years, constant for the past 4 days.  Has been associated with multiple intermittent episodes of N/V and "some" diarrhea.  Describes the abd pain as "sharp."  States she has had this pain "even before they took my gallbladder out." Denies fevers, no back pain, no rash, no CP/SOB, no black or blood in stools or emesis.       Past Medical History:  Diagnosis Date   Asthma    COPD (chronic obstructive pulmonary disease) ([place])    Multiple gastric ulcers     Patient Active Problem List   Diagnosis Date Noted   Asthma exacerbation 07/30/2018   Hypokalemia 07/30/2018   Acute bronchitis 07/30/2018   Hypoxia    Gastroesophageal reflux disease    Morbid obesity with BMI of 50.0-59.9, adult ([place]) 05/03/2018   Acute respiratory failure with hypoxemia ([place]) 05/03/2018   CAP (community acquired pneumonia) 06/24/2017   Asthma, chronic, unspecified asthma severity, with acute exacerbation 06/24/2017   Acute asthma exacerbation 06/24/2017    Past Surgical History:  Procedure Laterality Date   CHOLECYSTECTOMY     DIAGNOSTIC LAPAROSCOPY WITH REMOVAL OF ECTOPIC PREGNANCY Right 01/24/2017   Procedure: REMOVAL OF ECTOPIC PREGNANCY;  Surgeon: Florian Buff, MD;  Location: [facility];  Service: Gynecology;  Laterality: Right;   LAPAROSCOPIC UNILATERAL SALPINGECTOMY Right 01/24/2017   Procedure: LAPAROSCOPIC UNILATERAL SALPINGECTOMY;  Surgeon: Florian Buff, MD;  Location: [facility];  Service: Gynecology;  Laterality: Right;     OB History    Gravida  5   Para  4   Term  4   Preterm      AB      Living  4     SAB      TAB      Ectopic      Multiple      Live Births               Home Medications    Prior to Admission medications   Medication Sig Start Date End Date Taking? Authorizing Provider  albuterol (PROVENTIL) (2.5 MG/3ML) 0.083% nebulizer solution Take 3 mLs (2.5 mg total) by nebulization every 4 (four) hours as needed for wheezing or shortness of breath. 10/04/18   Lily Kocher, PA-C  azithromycin (ZITHROMAX) 250 MG tablet Take 1 tablet (250 mg total) by mouth daily. One po daily until finished. 10/04/18   Lily Kocher, PA-C  beclomethasone (QVAR REDIHALER) 80 MCG/ACT inhaler Inhale 2 puffs into the lungs 2 (two) times daily. 07/31/18   Kathie Dike, MD  famotidine (PEPCID) 20 MG tablet Take 20 mg by mouth at bedtime.    [provider]  guaiFENesin (MUCINEX) 600 MG 12 hr tablet Take 1 tablet (600 mg total) by mouth 2 (two) times daily. Patient not taking: Reported on 09/07/2018 07/31/18   Kathie Dike, MD  HYDROcodone-homatropine [facility]) 5-1.5 MG/5ML syrup Take 5 mLs by mouth every 6 (six) hours as needed. 10/04/18   Lily Kocher, PA-C  ipratropium-albuterol (DUONEB) 0.5-2.5 (3) MG/3ML SOLN Take 3 mLs by nebulization every 6 (six) hours as needed. 09/07/18  Fredia Sorrow, MD  loratadine (CLARITIN) 10 MG tablet Take 1 tablet (10 mg total) by mouth daily. Patient taking differently: Take 10 mg by mouth daily as needed for allergies.  08/01/18   Kathie Dike, MD  pantoprazole (PROTONIX) 40 MG tablet Take 1 tablet (40 mg total) by mouth daily. Patient not taking: Reported on 09/07/2018 08/01/18   Kathie Dike, MD  predniSONE (DELTASONE) 10 MG tablet Take 40mg  po daily for 2 days then 30mg  daily for 2 days then 20mg  daily for 2 days then 10mg  daily for 2 days then stop Patient not taking: Reported on 09/07/2018 07/31/18   Kathie Dike, MD  predniSONE (DELTASONE) 10 MG tablet Take 4 tablets (40 mg total) by mouth daily. 09/07/18   Fredia Sorrow, MD  ALBUTEROL IN Inhale 2  puffs into the lungs as needed. For shortness of breath   10/24/11  [provider]    Family History Family History  Problem Relation Age of Onset   Diabetes Maternal Grandfather    Heart attack Maternal Grandfather     Social History Social History   Tobacco Use   Smoking status: Former Smoker    Packs/day: 0.50    Years: 22.00    Pack years: 11.00    Types: Cigarettes    Last attempt to quit: 02/18/2017    Years since quitting: 1.6   Smokeless tobacco: Never Used  Substance Use Topics   Alcohol use: Yes    Comment: occasionally   Drug use: No     Allergies   Patient has no known allergies.   Review of Systems Review of Systems ROS: Statement: All systems negative except as marked or noted in the HPI; Constitutional: Negative for fever and chills. ; ; Eyes: Negative for eye pain, redness and discharge. ; ; ENMT: Negative for ear pain, hoarseness, nasal congestion, sinus pressure and sore throat. ; ; Cardiovascular: Negative for chest pain, palpitations, diaphoresis, dyspnea and peripheral edema. ; ; Respiratory: Negative for cough, wheezing and stridor. ; ; Gastrointestinal: +N/V/D, abd pain. Negative for blood in stool, hematemesis, jaundice and rectal bleeding. . ; ; Genitourinary: Negative for dysuria, flank pain and hematuria. ; ; Musculoskeletal: Negative for back pain and neck pain. Negative for swelling and trauma.; ; Skin: Negative for pruritus, rash, abrasions, blisters, bruising and skin lesion.; ; Neuro: Negative for headache, lightheadedness and neck stiffness. Negative for weakness, altered level of consciousness, altered mental status, extremity weakness, paresthesias, involuntary movement, seizure and syncope.       Physical Exam Updated Vital Signs BP 121/89 (BP Location: Right Arm)    Pulse (!) 101    Temp 98 F (36.7 C) (Oral)    Resp 17    Ht 5\' 5"  (1.651 m)    Wt (!) 137.9 kg    LMP 09/20/2018    SpO2 97%    BMI 50.59 kg/m   Physical  Exam 1235: Physical examination:  Nursing notes reviewed; Vital signs and O2 SAT reviewed;  Constitutional: Well developed, Well nourished, Well hydrated, In no acute distress; Head:  Normocephalic, atraumatic; Eyes: EOMI, PERRL, No scleral icterus; ENMT: Mouth and pharynx normal, Mucous membranes moist; Neck: Supple, Full range of motion, No lymphadenopathy; Cardiovascular: Regular rate and rhythm, No gallop; Respiratory: Breath sounds clear & equal bilaterally, No wheezes.  Speaking full sentences with ease, Normal respiratory effort/excursion; Chest: Nontender, Movement normal; Abdomen: Soft, +mid-epigastric tenderness to palp. No rebound or guarding. Nondistended, Normal bowel sounds; Genitourinary: No CVA tenderness; Extremities: Peripheral pulses normal,  No tenderness, No edema, No calf edema or asymmetry.; Neuro: AA&Ox3, Major CN grossly intact.  Speech clear. No gross focal motor or sensory deficits in extremities.; Skin: Color normal, Warm, Dry.    ED Treatments / Results  Labs (all labs ordered are listed, but only abnormal results are displayed)   EKG None  Radiology   Procedures Procedures (including critical care time)  Medications Ordered in ED Medications  sodium chloride flush (NS) 0.9 % injection 3 mL (has no administration in time range)  famotidine (PEPCID) IVPB 20 mg premix (has no administration in time range)  ondansetron (ZOFRAN) injection 4 mg (has no administration in time range)  sodium chloride 0.9 % bolus 1,000 mL (has no administration in time range)     Initial Impression / Assessment and Plan / ED Course  I have reviewed the triage vital signs and the nursing notes.  Pertinent labs & imaging results that were available during my care of the patient were reviewed by me and considered in my medical decision making (see chart for details).     MDM Reviewed: previous chart, nursing note and vitals Reviewed previous: labs Interpretation: labs, x-ray  and CT scan    Results for orders placed or performed during the hospital encounter of 10/17/18  Lipase, blood  Result Value Ref Range   Lipase 51 11 - 51 U/L  Comprehensive metabolic panel  Result Value Ref Range   Sodium 136 135 - 145 mmol/L   Potassium 3.6 3.5 - 5.1 mmol/L   Chloride 103 98 - 111 mmol/L   CO2 25 22 - 32 mmol/L   Glucose, Bld 137 (H) 70 - 99 mg/dL   BUN 9 6 - 20 mg/dL   Creatinine, Ser 0.71 0.44 - 1.00 mg/dL   Calcium 8.6 (L) 8.9 - 10.3 mg/dL   Total Protein 6.8 6.5 - 8.1 g/dL   Albumin 3.4 (L) 3.5 - 5.0 g/dL   AST 18 15 - 41 U/L   ALT 25 0 - 44 U/L   Alkaline Phosphatase 92 38 - 126 U/L   Total Bilirubin 0.4 0.3 - 1.2 mg/dL   GFR calc non Af Amer >60 >60 mL/min   GFR calc Af Amer >60 >60 mL/min   Anion gap 8 5 - 15  Urinalysis, Routine w reflex microscopic  Result Value Ref Range   Color, Urine AMBER (A) YELLOW   APPearance TURBID (A) CLEAR   Specific Gravity, Urine 1.026 1.005 - 1.030   pH 5.0 5.0 - 8.0   Glucose, UA NEGATIVE NEGATIVE mg/dL   Hgb urine dipstick NEGATIVE NEGATIVE   Bilirubin Urine NEGATIVE NEGATIVE   Ketones, ur NEGATIVE NEGATIVE mg/dL   Protein, ur NEGATIVE NEGATIVE mg/dL   Nitrite NEGATIVE NEGATIVE   Leukocytes,Ua SMALL (A) NEGATIVE   WBC, UA 0-5 0 - 5 WBC/hpf   Bacteria, UA FEW (A) NONE SEEN   Squamous Epithelial / LPF 6-10 0 - 5   Amorphous Crystal PRESENT   CBC with Differential  Result Value Ref Range   WBC 11.2 (H) 4.0 - 10.5 K/uL   RBC 4.65 3.87 - 5.11 MIL/uL   Hemoglobin 13.8 12.0 - 15.0 g/dL   HCT 42.3 36.0 - 46.0 %   MCV 91.0 80.0 - 100.0 fL   MCH 29.7 26.0 - 34.0 pg   MCHC 32.6 30.0 - 36.0 g/dL   RDW 12.7 11.5 - 15.5 %   Platelets 428 (H) 150 - 400 K/uL   nRBC 0.0 0.0 - 0.2 %  Neutrophils Relative % 55 %   Neutro Abs 6.2 1.7 - 7.7 K/uL   Lymphocytes Relative 32 %   Lymphs Abs 3.6 0.7 - 4.0 K/uL   Monocytes Relative 7 %   Monocytes Absolute 0.8 0.1 - 1.0 K/uL   Eosinophils Relative 5 %   Eosinophils  Absolute 0.5 0.0 - 0.5 K/uL   Basophils Relative 1 %   Basophils Absolute 0.1 0.0 - 0.1 K/uL   Immature Granulocytes 0 %   Abs Immature Granulocytes 0.04 0.00 - 0.07 K/uL  POC urine preg, ED (not at [facility])  Result Value Ref Range   Preg Test, Ur NEGATIVE NEGATIVE   Dg Chest 2 View Result Date: 10/17/2018 CLINICAL DATA:  Abdominal pain nausea and vomiting EXAM: CHEST - 2 VIEW COMPARISON:  10/04/2018 FINDINGS: The heart size and mediastinal contours are within normal limits. Both lungs are clear. The visualized skeletal structures are unremarkable. IMPRESSION: No active cardiopulmonary disease. Electronically Signed   By: Franchot Gallo M.D.   On: 10/17/2018 14:03    Ct Abdomen Pelvis W Contrast Result Date: 10/17/2018 CLINICAL DATA:  Epigastric pain with nausea and vomiting. EXAM: CT ABDOMEN AND PELVIS WITH CONTRAST TECHNIQUE: Multidetector CT imaging of the abdomen and pelvis was performed using the standard protocol following bolus administration of intravenous contrast. CONTRAST:  135mL OMNIPAQUE IOHEXOL 300 MG/ML  SOLN COMPARISON:  Abdominal x-rays dated January 06, 2018. FINDINGS: Lower chest: No acute abnormality. Linear scarring in the right middle lobe. 3.4 cm pericardial cyst at the right cardiophrenic angle. Hepatobiliary: Subcentimeter low-density lesion in the right hepatic lobe is too small to characterize. No other focal liver abnormality. Prior cholecystectomy. No biliary dilatation. Pancreas: Unremarkable. No pancreatic ductal dilatation or surrounding inflammatory changes. Spleen: Normal in size without focal abnormality. Adrenals/Urinary Tract: 2.0 cm right adrenal adenoma. The left adrenal gland is unremarkable. No focal renal lesion. Separate left upper and lower pole renal collecting systems with single left ureter. No renal or ureteral calculi. No hydronephrosis. The bladder is decompressed. Stomach/Bowel: Stomach is within normal limits. Appendix appears normal. No evidence of bowel  wall thickening, distention, or inflammatory changes. Mild sigmoid diverticulosis. Vascular/Lymphatic: No significant vascular findings are present. No enlarged abdominal or pelvic lymph nodes. Reproductive: 3.4 cm low-density lesion in the left ovary. The uterus and right ovary are unremarkable. Other: No abdominal wall hernia or abnormality. No abdominopelvic ascites. No pneumoperitoneum. Musculoskeletal: No acute or significant osseous findings. IMPRESSION: 1.  No acute intra-abdominal process. 2. Benign-appearing 3.4 cm left ovarian cyst. No further follow-up is required. This recommendation follows ACR consensus guidelines: White Paper of the ACR Incidental Findings Committee II on Adnexal Findings. J Am Coll Radiol 2013:10:675-681. 3. 2.0 cm right adrenal adenoma. 4. 3.4 cm pericardial cyst at the right cardiophrenic angle. Electronically Signed   By: Titus Dubin M.D.   On: 10/17/2018 14:21    1515:  Pt has tol PO well while in the ED without N/V.  No stooling while in the ED.  Abd benign, resps easy, VSS. Feels better and wants to go home now. Tx symptomatically at this time, f/u GI MD. Dx and testing d/w pt.  Questions answered.  Verb understanding, agreeable to d/c home with outpt f/u.    Final Clinical Impressions(s) / ED Diagnoses   Final diagnoses:  None    ED Discharge Orders    None       Francine Graven, DO 10/21/18 0830

## 2018-10-17 NOTE — Clinical Social Work Note (Signed)
Met with patient who has no insurance, no income, no PCP.  States she has been staying with friend since 2010, and has no other supports.  Noyt eligible for food stamps.  Is "thinking about" applying for disability because unable to work due to obesity and asthma.  States her inhaler costs over $200.00 per month, and she is not always able to afford it-open to referral to [facility] with hope that they can help with getting inhaler, as well as being able to connect with Dr.  Jerlyn Ly and got patient an appointment.

## 2018-10-17 NOTE — Discharge Instructions (Addendum)
Your CT scan showed incidental finding(s):  "1) Benign-appearing 3.4 cm left ovarian cyst. No further follow-up is required. This recommendation follows ACR consensus guidelines: White Paper of the ACR Incidental Findings Committee II on Adnexal Findings. J Am Coll Radiol 2013:10:675-681.; 2) 2.0 cm right adrenal adenoma." Your regular medical doctor can follow up these findings.  Eat a bland diet, avoiding greasy, fatty, fried foods, as well as spicy and acidic foods or beverages.  Avoid eating within 2 to 3 hours before going to bed or laying down.  Also avoid teas, colas, coffee, chocolate, pepermint and spearment.  Take the prescriptions as directed.  May also take over the counter maalox/mylanta, as directed on packaging, as needed for discomfort.  Call your regular medical doctor today to schedule a follow up appointment this week.  Call the GI doctor today to schedule a follow up appointment within the next week. Return to the Emergency Department immediately if worsening.

## 2018-10-21 DIAGNOSIS — Z139 Encounter for screening, unspecified: Secondary | ICD-10-CM

## 2018-10-21 LAB — GLUCOSE, POCT (MANUAL RESULT ENTRY): POC GLUCOSE: 113 mg/dL — AB (ref 70–99)

## 2018-10-21 NOTE — Congregational Nurse Program (Signed)
Dept: [phone]   Congregational Nurse Program Note  Date of Encounter: 10/21/2018  Past Medical History: Past Medical History:  Diagnosis Date  . Asthma   . COPD (chronic obstructive pulmonary disease) ([place])   . Multiple gastric ulcers     Encounter Details: CNP Questionnaire - 10/21/18 1242      Questionnaire   Patient Status  Not Applicable    Race  White or Caucasian    Location Patient Served At  [facility]  Not Applicable    Uninsured  Uninsured (NEW 1x/quarter)    Food  Yes, have food insecurities;Within past 12 months, worried food would run out with no money to buy more;Within past 12 months, food ran out with no money to buy more    Housing/Utilities  Yes, have permanent housing    Transportation  No transportation needs    Interpersonal Safety  Yes, feel physically and emotionally safe where you currently live    Medication  No medication insecurities    Medical Provider  No    Referrals  Area Agency;Medication Assistance;Primary Care Provider/Clinic;[facility]    ED Visit Averted  Not Applicable    Life-Saving Intervention Made  Not Applicable      Client in to [facility] to enroll in [place] today with C. Broadnax.. Enrollment completed. Client was recently seen at [facility] on 10/17/18 for epigastric pain. She was also treated on 10/04/18 for CAP (community acquired pneumonia). Client is uninsured. She states she is living with friends and that her housing is stable at present. ER visit for abdominal pain was given prescriptions for Famotidine and Ondansetron. Client states she did not fill Ondansetron due to cost. She states she is taking the other.  She reports she only has epigastric pain after eating. She denies nausea at present but does reports she had some nausea and vomiting yesterday. She does report loose stools yesterday but none today. ER recommended to follow up with GI and to come to [facility] for  referral to primary care.  Past Medical History: Asthma GERD Heart palpations.  Past surgical History Right fallopian tube removal due to tubal pregnancy 2018? cholecystectomy 2000  Medications as reported by client Albuterol nebulizer solution 0.083% QVAR inhaler 80MCG (almost out)  Allergies  Pantoprazole : swelling  Denies any epigastric pain at present. Denies constipation but has diarrhea yesterday. Denies any signs of blood in her stools. No diarrhea today.  RN has encouraged client to maintain good hydration. She denies any shortenss of breath at present but did use her duo neb prior to coming to [facility]. She reports history of seasonal allergies and with Spring she is having increased issues with her asthma. She denies chest pains. She does report right arm pain that she relates to an old injury where she fractured her right elbow and she was not able to have it treated properly per client.   Reminded client today of guidelines during [place] pandemic especially with her history of Asthma. Discussed social distancing, staying home as much as possible except for groceries, medications and medical appointments. Discussed proper handwashing .   Client reports needs for extra food. Local food pantry list given [employer] in [place] and their pantry hours. [employer] in 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  Referral also made for Primary Care and client prefers [facility] , appointment secured for 11/15/18 at 1015am.  Discussed low fat  diet including no greasy fatty foods.same as guidelines given to client from [place].  Will plan to follow up with client after her first visit with [place] RN

## 2018-10-26 ENCOUNTER — Telehealth: Payer: Self-pay | Admitting: Physician Assistant

## 2018-10-27 ENCOUNTER — Other Ambulatory Visit: Payer: Self-pay

## 2018-10-27 ENCOUNTER — Encounter (HOSPITAL_COMMUNITY): Payer: Self-pay

## 2018-10-27 ENCOUNTER — Emergency Department (HOSPITAL_COMMUNITY)
Admission: EM | Admit: 2018-10-27 | Discharge: 2018-10-27 | Disposition: A | Discharge status: Home / Self Care | Payer: Self-pay | Attending: Emergency Medicine | Admitting: Emergency Medicine

## 2018-10-27 DIAGNOSIS — J4541 Moderate persistent asthma with (acute) exacerbation: Secondary | ICD-10-CM | POA: Insufficient documentation

## 2018-10-27 DIAGNOSIS — Z87891 Personal history of nicotine dependence: Secondary | ICD-10-CM | POA: Insufficient documentation

## 2018-10-27 DIAGNOSIS — Z79899 Other long term (current) drug therapy: Secondary | ICD-10-CM | POA: Insufficient documentation

## 2018-10-27 DIAGNOSIS — J449 Chronic obstructive pulmonary disease, unspecified: Secondary | ICD-10-CM | POA: Insufficient documentation

## 2018-10-27 MED ORDER — IPRATROPIUM-ALBUTEROL 0.5-2.5 (3) MG/3ML IN SOLN
3.0000 mL | Freq: Once | RESPIRATORY_TRACT | Status: AC
  Administered 2018-10-27: 3 mL via RESPIRATORY_TRACT
  Filled 2018-10-27: qty 3

## 2018-10-27 MED ORDER — ALBUTEROL SULFATE HFA 108 (90 BASE) MCG/ACT IN AERS
2.0000 | INHALATION_SPRAY | Freq: Four times a day (QID) | RESPIRATORY_TRACT | Status: DC
  Administered 2018-10-27: 2 via RESPIRATORY_TRACT
  Filled 2018-10-27: qty 6.7

## 2018-10-27 MED ORDER — ALBUTEROL SULFATE (2.5 MG/3ML) 0.083% IN NEBU: 2.5000 mg | INHALATION_SOLUTION | Freq: Four times a day (QID) | RESPIRATORY_TRACT | 12 refills | Status: DC | PRN

## 2018-10-27 MED ORDER — ALBUTEROL SULFATE (2.5 MG/3ML) 0.083% IN NEBU: 5.0000 mg | INHALATION_SOLUTION | Freq: Once | RESPIRATORY_TRACT | Status: DC

## 2018-10-27 NOTE — ED Triage Notes (Signed)
Pt states she woke up from a nap around 1100 am and started having wheezing and SOB. Pt states her boyfriend was cleaning with bleach and it triggered the wheezing. Pt states she is out of her nebs and inhaler at home.

## 2018-10-27 NOTE — Discharge Instructions (Addendum)
Use the albuterol inhaler or either nebulizer every 6 hours for at least the next 7 days.  Return for any new or worse symptoms.  Follow-up with your regular doctor.

## 2018-10-27 NOTE — ED Provider Notes (Signed)
[facility] EMERGENCY DEPARTMENT Provider Note   CSN: [id number] Arrival date & time: 10/27/18  1444    History   Chief Complaint Chief Complaint  Patient presents with  . Asthma    HPI Linda Ellis is a 43 y.o. female.     Patient with known history of asthma.  Ran out of her inhaler and has been out of her nebulizer solution now days.  But ran out of the inhaler just about 4 days ago.  Patient with difficulty wheezing and an exacerbation today after her husband was cleaning the house with bleach.  Irritate her lungs.  Patient without fever any upper respiratory symptoms.  No one sick in the household.     Past Medical History:  Diagnosis Date  . Asthma   . COPD (chronic obstructive pulmonary disease) ([place])   . Multiple gastric ulcers     Patient Active Problem List   Diagnosis Date Noted  . Asthma exacerbation 07/30/2018  . Hypokalemia 07/30/2018  . Acute bronchitis 07/30/2018  . Hypoxia   . Gastroesophageal reflux disease   . Morbid obesity with BMI of 50.0-59.9, adult ([place]) 05/03/2018  . Acute respiratory failure with hypoxemia ([place]) 05/03/2018  . CAP (community acquired pneumonia) 06/24/2017  . Asthma, chronic, unspecified asthma severity, with acute exacerbation 06/24/2017  . Acute asthma exacerbation 06/24/2017    Past Surgical History:  Procedure Laterality Date  . CHOLECYSTECTOMY    . DIAGNOSTIC LAPAROSCOPY WITH REMOVAL OF ECTOPIC PREGNANCY Right 01/24/2017   Procedure: REMOVAL OF ECTOPIC PREGNANCY;  Surgeon: Florian Buff, MD;  Location: [facility];  Service: Gynecology;  Laterality: Right;  . LAPAROSCOPIC UNILATERAL SALPINGECTOMY Right 01/24/2017   Procedure: LAPAROSCOPIC UNILATERAL SALPINGECTOMY;  Surgeon: Florian Buff, MD;  Location: [facility];  Service: Gynecology;  Laterality: Right;     OB History    Gravida  5   Para  4   Term  4   Preterm      AB      Living  4     SAB      TAB      Ectopic      Multiple      Live Births               Home Medications    Prior to Admission medications   Medication Sig Start Date End Date Taking? Authorizing Provider  acetaminophen (TYLENOL) 500 MG tablet Take 1,000 mg by mouth as needed.    [provider]  albuterol (PROVENTIL) (2.5 MG/3ML) 0.083% nebulizer solution Take 3 mLs (2.5 mg total) by nebulization every 4 (four) hours as needed for wheezing or shortness of breath. 10/04/18   Lily Kocher, PA-C  albuterol (PROVENTIL) (2.5 MG/3ML) 0.083% nebulizer solution Take 3 mLs (2.5 mg total) by nebulization every 6 (six) hours as needed for wheezing or shortness of breath. 10/27/18   Fredia Sorrow, MD  beclomethasone (QVAR REDIHALER) 80 MCG/ACT inhaler Inhale 2 puffs into the lungs 2 (two) times daily. 07/31/18   Kathie Dike, MD  famotidine (PEPCID) 20 MG tablet Take 1 tablet (20 mg total) by mouth daily. 10/17/18   Francine Graven, DO  ondansetron (ZOFRAN ODT) 4 MG disintegrating tablet Take 1 tablet (4 mg total) by mouth every 8 (eight) hours as needed for nausea or vomiting. 10/17/18   Francine Graven, DO  ALBUTEROL IN Inhale 2 puffs into the lungs as needed. For shortness of breath   10/24/11  [provider]    [facility]  History Family History  Problem Relation Age of Onset  . Diabetes Maternal Grandfather   . Heart attack Maternal Grandfather     Social History Social History   Tobacco Use  . Smoking status: Former Smoker    Packs/day: 0.50    Years: 22.00    Pack years: 11.00    Types: Cigarettes    Last attempt to quit: 02/18/2017    Years since quitting: 1.6  . Smokeless tobacco: Never Used  Substance Use Topics  . Alcohol use: Yes    Comment: occasionally  . Drug use: No     Allergies   Pantoprazole sodium   Review of Systems Review of Systems  Constitutional: Negative for chills and fever.  HENT: Negative for congestion, rhinorrhea and sore throat.   Eyes: Negative for visual disturbance.  Respiratory: Positive for  shortness of breath and wheezing. Negative for cough.   Cardiovascular: Negative for chest pain and leg swelling.  Gastrointestinal: Negative for abdominal pain, diarrhea, nausea and vomiting.  Genitourinary: Negative for dysuria.  Musculoskeletal: Negative for back pain and neck pain.  Skin: Negative for rash.  Neurological: Negative for dizziness, light-headedness and headaches.  Hematological: Does not bruise/bleed easily.  Psychiatric/Behavioral: Negative for confusion.     Physical Exam Updated Vital Signs BP 110/67 (BP Location: Right Arm)   Pulse (!) 101   Temp 98 F (36.7 C) (Oral)   Resp 18   Ht 1.651 m (5\' 5" )   Wt (!) 137.9 kg   LMP 10/19/2018   SpO2 96%   BMI 50.59 kg/m   Physical Exam Vitals signs and nursing note reviewed.  Constitutional:      General: She is not in acute distress.    Appearance: Normal appearance. She is well-developed.  HENT:     Head: Normocephalic and atraumatic.     Mouth/Throat:     Mouth: Mucous membranes are moist.  Eyes:     Conjunctiva/sclera: Conjunctivae normal.  Neck:     Musculoskeletal: Normal range of motion and neck supple.  Cardiovascular:     Rate and Rhythm: Normal rate and regular rhythm.     Heart sounds: No murmur.  Pulmonary:     Effort: Pulmonary effort is normal. No respiratory distress.     Breath sounds: Wheezing present.  Abdominal:     Palpations: Abdomen is soft.     Tenderness: There is no abdominal tenderness.  Musculoskeletal: Normal range of motion.  Skin:    General: Skin is warm and dry.  Neurological:     General: No focal deficit present.     Mental Status: She is alert and oriented to person, place, and time.      ED Treatments / Results  Labs (all labs ordered are listed, but only abnormal results are displayed) Labs Reviewed - No data to display  EKG None  Radiology No results found.  Procedures Procedures (including critical care time)  Medications Ordered in ED  Medications  albuterol (PROVENTIL HFA;VENTOLIN HFA) 108 (90 Base) MCG/ACT inhaler 2 puff (has no administration in time range)  ipratropium-albuterol (DUONEB) 0.5-2.5 (3) MG/3ML nebulizer solution 3 mL (3 mLs Nebulization Given 10/27/18 1636)     Initial Impression / Assessment and Plan / ED Course  I have reviewed the triage vital signs and the nursing notes.  Pertinent labs & imaging results that were available during my care of the patient were reviewed by me and considered in my medical decision making (see chart for details).  Patient with significant bilateral wheezing.  No retractions.  Patient received 1 albuterol Atrovent nebulizer and all wheezing resolved.  We will give her an albuterol inhaler here to go.  And have written a prescription for her nebulizer albuterol to use at home.  Will recommend patient take either the nebulizer treatment or use the albuterol inhaler every 6 hours for the next week and beyond that as needed.  Final Clinical Impressions(s) / ED Diagnoses   Final diagnoses:  Moderate persistent asthma with exacerbation    ED Discharge Orders         Ordered    albuterol (PROVENTIL) (2.5 MG/3ML) 0.083% nebulizer solution  Every 6 hours PRN     10/27/18 1817           Fredia Sorrow, MD 10/27/18 1820

## 2018-10-28 ENCOUNTER — Encounter (HOSPITAL_COMMUNITY): Payer: Self-pay | Admitting: Emergency Medicine

## 2018-10-28 ENCOUNTER — Emergency Department (HOSPITAL_COMMUNITY)
Admission: EM | Admit: 2018-10-28 | Discharge: 2018-10-28 | Disposition: A | Discharge status: Home / Self Care | Payer: Self-pay | Attending: Emergency Medicine | Admitting: Emergency Medicine

## 2018-10-28 ENCOUNTER — Other Ambulatory Visit: Payer: Self-pay

## 2018-10-28 DIAGNOSIS — Z79899 Other long term (current) drug therapy: Secondary | ICD-10-CM | POA: Insufficient documentation

## 2018-10-28 DIAGNOSIS — J4541 Moderate persistent asthma with (acute) exacerbation: Secondary | ICD-10-CM | POA: Insufficient documentation

## 2018-10-28 DIAGNOSIS — Z87891 Personal history of nicotine dependence: Secondary | ICD-10-CM | POA: Insufficient documentation

## 2018-10-28 MED ORDER — ALBUTEROL (5 MG/ML) CONTINUOUS INHALATION SOLN
10.0000 mg | INHALATION_SOLUTION | Freq: Once | RESPIRATORY_TRACT | Status: AC
  Administered 2018-10-28: 10 mg via RESPIRATORY_TRACT
  Filled 2018-10-28: qty 20

## 2018-10-28 MED ORDER — DEXAMETHASONE SODIUM PHOSPHATE 10 MG/ML IJ SOLN
10.0000 mg | Freq: Once | INTRAMUSCULAR | Status: AC
  Administered 2018-10-28: 10 mg via INTRAMUSCULAR
  Filled 2018-10-28: qty 1

## 2018-10-28 MED ORDER — ALBUTEROL SULFATE HFA 108 (90 BASE) MCG/ACT IN AERS: 3.0000 | INHALATION_SPRAY | Freq: Once | RESPIRATORY_TRACT | Status: DC

## 2018-10-28 MED ORDER — ALBUTEROL SULFATE HFA 108 (90 BASE) MCG/ACT IN AERS
3.0000 | INHALATION_SPRAY | Freq: Once | RESPIRATORY_TRACT | Status: AC
  Administered 2018-10-28: 3 via RESPIRATORY_TRACT

## 2018-10-28 MED ORDER — DEXAMETHASONE 4 MG PO TABS: 4.0000 mg | ORAL_TABLET | Freq: Two times a day (BID) | ORAL | 0 refills | Status: DC

## 2018-10-28 NOTE — ED Provider Notes (Signed)
[facility] EMERGENCY DEPARTMENT Provider Note   CSN: [id number] Arrival date & time: 10/28/18  1148    History   Chief Complaint Chief Complaint  Patient presents with  . Shortness of Breath    HPI Linda Ellis is a 43 y.o. female.     The history is provided by the patient.  Shortness of Breath  Severity:  Moderate Onset quality:  Sudden Duration:  1 day Timing:  Intermittent Progression:  Worsening Chronicity:  Recurrent Context: fumes   Context comment:  Bleech fumes Relieved by:  Nothing Worsened by:  Nothing Ineffective treatments:  Inhaler Associated symptoms: cough and wheezing   Associated symptoms: no abdominal pain, no chest pain and no neck pain     Past Medical History:  Diagnosis Date  . Asthma   . COPD (chronic obstructive pulmonary disease) ([place])   . Multiple gastric ulcers     Patient Active Problem List   Diagnosis Date Noted  . Asthma exacerbation 07/30/2018  . Hypokalemia 07/30/2018  . Acute bronchitis 07/30/2018  . Hypoxia   . Gastroesophageal reflux disease   . Morbid obesity with BMI of 50.0-59.9, adult ([place]) 05/03/2018  . Acute respiratory failure with hypoxemia ([place]) 05/03/2018  . CAP (community acquired pneumonia) 06/24/2017  . Asthma, chronic, unspecified asthma severity, with acute exacerbation 06/24/2017  . Acute asthma exacerbation 06/24/2017    Past Surgical History:  Procedure Laterality Date  . CHOLECYSTECTOMY    . DIAGNOSTIC LAPAROSCOPY WITH REMOVAL OF ECTOPIC PREGNANCY Right 01/24/2017   Procedure: REMOVAL OF ECTOPIC PREGNANCY;  Surgeon: Florian Buff, MD;  Location: [facility];  Service: Gynecology;  Laterality: Right;  . LAPAROSCOPIC UNILATERAL SALPINGECTOMY Right 01/24/2017   Procedure: LAPAROSCOPIC UNILATERAL SALPINGECTOMY;  Surgeon: Florian Buff, MD;  Location: [facility];  Service: Gynecology;  Laterality: Right;     OB History    Gravida  5   Para  4   Term  4   Preterm      AB      Living  4     SAB       TAB      Ectopic      Multiple      Live Births               Home Medications    Prior to Admission medications   Medication Sig Start Date End Date Taking? Authorizing Provider  acetaminophen (TYLENOL) 500 MG tablet Take 1,000 mg by mouth as needed.    [provider]  albuterol (PROVENTIL) (2.5 MG/3ML) 0.083% nebulizer solution Take 3 mLs (2.5 mg total) by nebulization every 4 (four) hours as needed for wheezing or shortness of breath. 10/04/18   Lily Kocher, PA-C  albuterol (PROVENTIL) (2.5 MG/3ML) 0.083% nebulizer solution Take 3 mLs (2.5 mg total) by nebulization every 6 (six) hours as needed for wheezing or shortness of breath. 10/27/18   Fredia Sorrow, MD  beclomethasone (QVAR REDIHALER) 80 MCG/ACT inhaler Inhale 2 puffs into the lungs 2 (two) times daily. 07/31/18   Kathie Dike, MD  famotidine (PEPCID) 20 MG tablet Take 1 tablet (20 mg total) by mouth daily. 10/17/18   Francine Graven, DO  ondansetron (ZOFRAN ODT) 4 MG disintegrating tablet Take 1 tablet (4 mg total) by mouth every 8 (eight) hours as needed for nausea or vomiting. 10/17/18   Francine Graven, DO  ALBUTEROL IN Inhale 2 puffs into the lungs as needed. For shortness of breath   10/24/11  [provider]  Family History Family History  Problem Relation Age of Onset  . Diabetes Maternal Grandfather   . Heart attack Maternal Grandfather     Social History Social History   Tobacco Use  . Smoking status: Former Smoker    Packs/day: 0.50    Years: 22.00    Pack years: 11.00    Types: Cigarettes    Last attempt to quit: 02/18/2017    Years since quitting: 1.6  . Smokeless tobacco: Never Used  Substance Use Topics  . Alcohol use: Yes    Comment: occasionally  . Drug use: No     Allergies   Pantoprazole sodium   Review of Systems Review of Systems  Constitutional: Negative for activity change.       All ROS Neg except as noted in HPI  HENT: Negative for  nosebleeds.   Eyes: Negative for photophobia and discharge.  Respiratory: Positive for cough, shortness of breath and wheezing.   Cardiovascular: Negative for chest pain and palpitations.  Gastrointestinal: Negative for abdominal pain and blood in stool.  Genitourinary: Negative for dysuria, frequency and hematuria.  Musculoskeletal: Negative for arthralgias, back pain and neck pain.  Skin: Negative.   Neurological: Negative for dizziness, seizures and speech difficulty.  Psychiatric/Behavioral: Negative for confusion and hallucinations.     Physical Exam Updated Vital Signs BP (!) 134/99 (BP Location: Right Arm)   Pulse (!) 106   Temp 98.1 F (36.7 C) (Oral)   Resp 20   Ht 5\' 5"  (1.651 m)   Wt (!) 137.9 kg   LMP 10/19/2018   SpO2 96%   BMI 50.59 kg/m   Physical Exam Vitals signs and nursing note reviewed.  Constitutional:      Appearance: She is well-developed. She is not toxic-appearing.  HENT:     Head: Normocephalic.     Right Ear: Tympanic membrane and external ear normal.     Left Ear: Tympanic membrane and external ear normal.  Eyes:     General: Lids are normal.     Pupils: Pupils are equal, round, and reactive to light.  Neck:     Musculoskeletal: Normal range of motion and neck supple.     Vascular: No carotid bruit.  Cardiovascular:     Rate and Rhythm: Normal rate and regular rhythm.     Pulses: Normal pulses.     Heart sounds: Normal heart sounds.  Pulmonary:     Effort: No respiratory distress.     Breath sounds: Wheezing present.  Abdominal:     General: Bowel sounds are normal.     Palpations: Abdomen is soft.     Tenderness: There is no abdominal tenderness. There is no guarding.  Musculoskeletal: Normal range of motion.  Lymphadenopathy:     Head:     Right side of head: No submandibular adenopathy.     Left side of head: No submandibular adenopathy.     Cervical: No cervical adenopathy.  Skin:    General: Skin is warm and dry.   Neurological:     Mental Status: She is alert and oriented to person, place, and time.     Cranial Nerves: No cranial nerve deficit.     Sensory: No sensory deficit.  Psychiatric:        Speech: Speech normal.      ED Treatments / Results  Labs (all labs ordered are listed, but only abnormal results are displayed) Labs Reviewed - No data to display  EKG None  Radiology No results  found.  Procedures Procedures (including critical care time)  Medications Ordered in ED Medications - No data to display   Initial Impression / Assessment and Plan / ED Course  I have reviewed the triage vital signs and the nursing notes.  Pertinent labs & imaging results that were available during my care of the patient were reviewed by me and considered in my medical decision making (see chart for details).          Final Clinical Impressions(s) / ED Diagnoses MDM  Heart rate is elevated, otherwise the vital signs are within normal limits.  Pulse oximetry is 96% on room air.  Patient speaks in complete sentences, but has very soft inspiratory and some expiratory wheeze.  Patient treated with 3 puffs of albuterol inhaler.  Recheck, patient continues to have wheezing.  Patient treated with a second 3 puffs of albuterol inhaler.  The patient states that she still feels tight.  Patient relayed to nursing staff that she wanted to go home, because she could take an inhaler at home.  We discussed the case with respiratory therapy, and they have agreed to give the patient a nebulizer treatment since at this time she is low risk of COVID-19.  Recheck.  Patient states she feels a lot better.  Will check the pulse oximetry with ambulation.  No desaturation with ambulation on room air.  Patient will be discharged home with albuterol inhaler and steroids.  Patient is given strict instructions to return immediately if any changes in her condition, problems, or concerns.   Final diagnoses:   Moderate persistent asthma with exacerbation    ED Discharge Orders         Ordered    dexamethasone (DECADRON) 4 MG tablet  2 times daily with meals     10/28/18 1735           Lily Kocher, PA-C 10/30/18 9480    Sherwood Gambler, MD 10/30/18 1540

## 2018-10-28 NOTE — ED Triage Notes (Signed)
Pt states that she was seen yesterday and she short of breath again they did not put her on a steroid yesterday

## 2018-10-28 NOTE — ED Notes (Signed)
Ambulated around ED.   o2 was 97%.  HR was 106.  Did not c/o any sob, dizziness or cp.

## 2018-10-28 NOTE — Discharge Instructions (Addendum)
Your examination suggest an exacerbation of your asthma.  Please use your albuterol 2 puffs every 4 hours.  Please use Decadron 2 times daily with food.  Please increase fluids.  Please see your primary physician or return to the emergency department immediately if any difficulty with breathing, excessive coughing, problems, or concerns.

## 2018-10-28 NOTE — ED Notes (Signed)
Pt conts to wheeze and chest is tight. Pt reports she can go home and do a neb tx as opposed to MDI which is not helping. Spoke with wendy and she can do a neb on pt . Provider notified

## 2018-11-15 ENCOUNTER — Encounter: Payer: Self-pay | Admitting: Physician Assistant

## 2018-11-15 ENCOUNTER — Ambulatory Visit: Payer: Self-pay | Admitting: Physician Assistant

## 2018-11-15 ENCOUNTER — Other Ambulatory Visit: Payer: Self-pay

## 2018-11-15 VITALS — BP 94/60 | HR 109 | Temp 97.5°F | Resp 17

## 2018-11-15 DIAGNOSIS — J4541 Moderate persistent asthma with (acute) exacerbation: Secondary | ICD-10-CM

## 2018-11-15 DIAGNOSIS — Z7689 Persons encountering health services in other specified circumstances: Secondary | ICD-10-CM

## 2018-11-15 DIAGNOSIS — K219 Gastro-esophageal reflux disease without esophagitis: Secondary | ICD-10-CM

## 2018-11-15 MED ORDER — MOMETASONE FURO-FORMOTEROL FUM 100-5 MCG/ACT IN AERO: 2.0000 | INHALATION_SPRAY | Freq: Two times a day (BID) | RESPIRATORY_TRACT | 1 refills | Status: DC

## 2018-11-15 MED ORDER — METHYLPREDNISOLONE ACETATE 80 MG/ML IJ SUSP
80.0000 mg | Freq: Once | INTRAMUSCULAR | Status: AC
  Administered 2018-11-15: 80 mg via INTRAMUSCULAR

## 2018-11-15 MED ORDER — ALBUTEROL SULFATE (2.5 MG/3ML) 0.083% IN NEBU: 2.5000 mg | INHALATION_SOLUTION | Freq: Four times a day (QID) | RESPIRATORY_TRACT | 1 refills | Status: DC | PRN

## 2018-11-15 MED ORDER — ALBUTEROL SULFATE HFA 108 (90 BASE) MCG/ACT IN AERS: 1.0000 | INHALATION_SPRAY | Freq: Four times a day (QID) | RESPIRATORY_TRACT | 1 refills | Status: DC | PRN

## 2018-11-15 MED ORDER — IPRATROPIUM-ALBUTEROL 0.5-2.5 (3) MG/3ML IN SOLN: 3.0000 mL | RESPIRATORY_TRACT | 3 refills | Status: DC | PRN

## 2018-11-15 MED ORDER — LORATADINE 10 MG PO TABS: 10.0000 mg | ORAL_TABLET | Freq: Every day | ORAL | 1 refills | Status: DC

## 2018-11-15 MED ORDER — IPRATROPIUM BROMIDE 0.02 % IN SOLN: 0.5000 mg | Freq: Four times a day (QID) | RESPIRATORY_TRACT | 1 refills | Status: DC | PRN

## 2018-11-15 NOTE — Progress Notes (Signed)
BP 94/60   Pulse (!) 109   Temp (!) 97.5 F (36.4 C)   Resp 17   LMP 10/19/2018   SpO2 94%    Subjective:    Patient ID: Linda Ellis, female    DOB: March 06, 1976, 43 y.o.   MRN: [id number]  HPI: Linda Ellis is a 43 y.o. female presenting on 11/15/2018 for New Patient (Initial Visit)   HPI   Chief Complaint  Patient presents with  . New Patient (Initial Visit)     Pt with long history of asthma complicated by smoking (she quit in 2018) and morbid obesity.    Pt was seen in ER 4 times last month- 3 for breathing issues and 1 x for abd pain.  CAP on 10/04/18.    Pt ran Out of her qvar 3 weeks ago.  She had neb teatment about 7:30 this morning.  She says when she has a flare that plain albuterol nebs don't work for her too well.  She says the duonebs are much better.  She has refills at the pharmacy but she can't afford them.    Pt has had No fevers.   She has taken no anti-pyretics today.    Pt was on ranitidine prior to it being recalled.  She says that worked really well for her.  She says the pepcid is working well too (just not as good as the ranitidine did).  She says she is having no abdominal pain now.    Relevant past medical, surgical, family and social history reviewed and updated as indicated. Interim medical history since our last visit reviewed. Allergies and medications reviewed and updated.   Current Outpatient Medications:  .  acetaminophen (TYLENOL) 500 MG tablet, Take 1,000 mg by mouth as needed., Disp: , Rfl:  .  albuterol (PROVENTIL HFA;VENTOLIN HFA) 108 (90 Base) MCG/ACT inhaler, Inhale 1-2 puffs into the lungs every 6 (six) hours as needed for wheezing or shortness of breath., Disp: , Rfl:  .  albuterol (PROVENTIL) (2.5 MG/3ML) 0.083% nebulizer solution, Take 3 mLs (2.5 mg total) by nebulization every 6 (six) hours as needed for wheezing or shortness of breath., Disp: 75 mL, Rfl: 12 .  famotidine (PEPCID) 20 MG tablet, Take 1 tablet (20 mg total) by mouth  daily., Disp: 20 tablet, Rfl: 0 .  loratadine (CLARITIN) 10 MG tablet, Take 10 mg by mouth daily., Disp: , Rfl:  .  beclomethasone (QVAR REDIHALER) 80 MCG/ACT inhaler, Inhale 2 puffs into the lungs 2 (two) times daily. (Patient not taking: Reported on 11/15/2018), Disp: 1 Inhaler, Rfl: 1    Review of Systems  Per HPI unless specifically indicated above     Objective:    BP 94/60   Pulse (!) 109   Temp (!) 97.5 F (36.4 C)   Resp 17   LMP 10/19/2018   SpO2 94%   Wt Readings from Last 3 Encounters:  10/28/18 (!) 304 lb (137.9 kg)  10/27/18 (!) 304 lb (137.9 kg)  10/21/18 (!) 304 lb (137.9 kg)    Physical Exam Vitals signs and nursing note reviewed.  Constitutional:      Appearance: She is obese.  HENT:     Head: Normocephalic and atraumatic.  Eyes:     General: Lids are normal.     Extraocular Movements: Extraocular movements intact.     Conjunctiva/sclera: Conjunctivae normal.     Pupils: Pupils are equal, round, and reactive to light.  Neck:     Musculoskeletal:  Neck supple. No neck rigidity.  Cardiovascular:     Rate and Rhythm: Regular rhythm. Tachycardia present.     Heart sounds: Normal heart sounds.     Comments: mildy fast Pulmonary:     Effort: No respiratory distress.     Breath sounds: Wheezing present. No rhonchi.     Comments: Exp wheezes bilaterally.  No respiratory distress.  RR 20 Abdominal:     General: Bowel sounds are normal.     Palpations: Abdomen is soft.     Tenderness: There is no abdominal tenderness. There is no guarding or rebound.  Musculoskeletal:     Right lower leg: No edema.     Left lower leg: No edema.  Skin:    General: Skin is warm and dry.  Neurological:     General: No focal deficit present.     Mental Status: She is alert and oriented to person, place, and time.     Gait: Gait normal.  Psychiatric:        Mood and Affect: Mood normal.        Behavior: Behavior normal.         Assessment & Plan:    Encounter  Diagnoses  Name Primary?  . Encounter to establish care Yes  . Moderate persistent asthma with acute exacerbation   . Gastroesophageal reflux disease, esophagitis presence not specified   . Morbid obesity ([place])      -Pt is given 3 vials of duoneb to take home and use now (no nebulizer treatments in office at this time due to CV19) -depomedrol 80mg  given in office today -No labs will be ordered at this time.  Recent labs reviewed.   -rx sent to [facility] to be delivered 7-10 days -rx sent to [facility] to get duoneb to last her until meds arrive from mail order  She is given coupon. -discussed with pt strategy to keep her out of ER is to keep her from running out of her meds.  She is in agreement with this plan.  She is happy to have a plan to not go to the ER today -She has a smartphone -pt will be seen for follow up in 2 months.  She will contact office sooner if she has any problems.

## 2018-11-16 ENCOUNTER — Other Ambulatory Visit: Payer: Self-pay | Admitting: Physician Assistant

## 2018-11-16 ENCOUNTER — Telehealth: Payer: Self-pay | Admitting: Student

## 2018-11-16 MED ORDER — PREDNISONE 20 MG PO TABS: 20.0000 mg | ORAL_TABLET | Freq: Two times a day (BID) | ORAL | 0 refills | Status: DC

## 2018-11-16 NOTE — Telephone Encounter (Signed)
-----   Message from [name], [place] sent at 11/16/2018  1:17 PM EDT ----- Rx sent ----- Message ----- From: Jinny Blossom, LPN Sent: 01/24/4694  10:53 AM EDT To: [name], PA-C  Pt called stating shot of depomedrol that was given to her at yesterday's (11-15-18) office visit is not helping. Pt states she is using neb treatments which help at the moment but when she gets up even to walk to the bathroom pt "can't breath". Pt wants to know if she can get rx of prednisone sent to [facility]. Pt call back number is [phone]

## 2018-12-05 ENCOUNTER — Other Ambulatory Visit: Payer: Self-pay | Admitting: Physician Assistant

## 2018-12-05 ENCOUNTER — Encounter: Payer: Self-pay | Admitting: Physician Assistant

## 2018-12-05 ENCOUNTER — Ambulatory Visit: Payer: Self-pay | Admitting: Physician Assistant

## 2018-12-05 DIAGNOSIS — J449 Chronic obstructive pulmonary disease, unspecified: Secondary | ICD-10-CM

## 2018-12-05 DIAGNOSIS — J4541 Moderate persistent asthma with (acute) exacerbation: Secondary | ICD-10-CM

## 2018-12-05 MED ORDER — PREDNISONE 20 MG PO TABS: 20.0000 mg | ORAL_TABLET | Freq: Two times a day (BID) | ORAL | 0 refills | Status: DC

## 2018-12-05 NOTE — Progress Notes (Signed)
There were no vitals taken for this visit.   Subjective:    Patient ID: Linda Ellis, female    DOB: [birth year], 43 y.o.   MRN: [id number]  HPI: Linda Ellis is a 43 y.o. female presenting on 12/05/2018 for No chief complaint on file.   HPI   This is a telemedicine appointment through [employer] due to coronavirus pandemic.  I connected with  Linda Ellis on 12/05/18 by a video enabled telemedicine application and verified that I am speaking with the correct person using two identifiers.   I discussed the limitations of evaluation and management by telemedicine. The patient expressed understanding and agreed to proceed.  Pt called office wanting refill of prednisone.  She was given depo-medrol and rx prednisone mid April for asthma/COPD flare.  Pt was started on dulera for maintenance and has albuterol mdi and nebulizers.   Pt reports shortness of breath that started yesterday.  She states No fever.  She says the Nebulizer helped some.  She says she Coughed all night.    Her R ear itches some.  She has been using straight ipratropium in her nebulizer (it was ordered separately due to availability through medication assistance but pt was told to mix with albuterol which she has not been doing)  No sick contacts.   Her partner lives with her and works at [employer] but is showing no signs of illness.  Relevant past medical, surgical, family and social history reviewed and updated as indicated. Interim medical history since our last visit reviewed. Allergies and medications reviewed and updated.    Current Outpatient Medications:  .  albuterol (PROVENTIL HFA;VENTOLIN HFA) 108 (90 Base) MCG/ACT inhaler, Inhale 1-2 puffs into the lungs every 6 (six) hours as needed for wheezing or shortness of breath., Disp: 3 Inhaler, Rfl: 1 .  famotidine (PEPCID) 20 MG tablet, Take 1 tablet (20 mg total) by mouth daily., Disp: 20 tablet, Rfl: 0 .  ipratropium (ATROVENT) 0.02 % nebulizer solution, Take 2.5  mLs (0.5 mg total) by nebulization every 6 (six) hours as needed for wheezing or shortness of breath., Disp: 75 mL, Rfl: 1 .  loratadine (CLARITIN) 10 MG tablet, Take 1 tablet (10 mg total) by mouth daily., Disp: 90 tablet, Rfl: 1 .  mometasone-formoterol (DULERA) 100-5 MCG/ACT AERO, Inhale 2 puffs into the lungs 2 (two) times daily., Disp: 3 Inhaler, Rfl: 1 .  albuterol (PROVENTIL) (2.5 MG/3ML) 0.083% nebulizer solution, Take 3 mLs (2.5 mg total) by nebulization every 6 (six) hours as needed for wheezing or shortness of breath. (Patient not taking: Reported on 12/05/2018), Disp: 90 vial, Rfl: 1   Review of Systems  Per HPI unless specifically indicated above     Objective:    There were no vitals taken for this visit.  Wt Readings from Last 3 Encounters:  10/28/18 (!) 304 lb (137.9 kg)  10/27/18 (!) 304 lb (137.9 kg)  10/21/18 (!) 304 lb (137.9 kg)    Physical Exam Constitutional:      General: She is not in acute distress.    Appearance: She is obese. She is not ill-appearing.  HENT:     Head: Normocephalic and atraumatic.  Pulmonary:     Effort: Pulmonary effort is normal. No tachypnea or respiratory distress.     Comments: Pt is talking without noticeable dyspnea Neurological:     Mental Status: She is alert and oriented to person, place, and time.  Psychiatric:        Attention  and Perception: Attention normal.        Mood and Affect: Mood normal.        Speech: Speech normal.        Behavior: Behavior is cooperative.         Assessment & Plan:   Encounter Diagnoses  Name Primary?  . Moderate persistent asthma with acute exacerbation Yes  . Chronic obstructive pulmonary disease, unspecified COPD type ([place])   . Morbid obesity ([place])     -rx prednisone sent.  Discussed with pt that If she flares again so soon, she will need refer to pulmonary specialist -pt counseled to mix her neb meds to use albuterol and ipratropium together  -pt to follow up in June as  scheduled.  She is to contact office sooner prn

## 2019-01-17 ENCOUNTER — Encounter: Payer: Self-pay | Admitting: Physician Assistant

## 2019-01-17 ENCOUNTER — Other Ambulatory Visit: Payer: Self-pay | Admitting: Physician Assistant

## 2019-01-17 ENCOUNTER — Encounter (HOSPITAL_COMMUNITY): Payer: Self-pay | Admitting: Emergency Medicine

## 2019-01-17 ENCOUNTER — Emergency Department (HOSPITAL_COMMUNITY): Payer: Self-pay

## 2019-01-17 ENCOUNTER — Other Ambulatory Visit: Payer: Self-pay

## 2019-01-17 ENCOUNTER — Telehealth: Payer: Self-pay | Admitting: *Deleted

## 2019-01-17 ENCOUNTER — Emergency Department (HOSPITAL_COMMUNITY)
Admission: EM | Admit: 2019-01-17 | Discharge: 2019-01-17 | Disposition: A | Discharge status: Home / Self Care | Payer: Self-pay | Attending: Emergency Medicine | Admitting: Emergency Medicine

## 2019-01-17 ENCOUNTER — Ambulatory Visit: Payer: Self-pay | Admitting: Physician Assistant

## 2019-01-17 DIAGNOSIS — Z20822 Contact with and (suspected) exposure to covid-19: Secondary | ICD-10-CM

## 2019-01-17 DIAGNOSIS — Z6841 Body Mass Index (BMI) 40.0 and over, adult: Secondary | ICD-10-CM

## 2019-01-17 DIAGNOSIS — Z79899 Other long term (current) drug therapy: Secondary | ICD-10-CM | POA: Insufficient documentation

## 2019-01-17 DIAGNOSIS — R059 Cough, unspecified: Secondary | ICD-10-CM | POA: Insufficient documentation

## 2019-01-17 DIAGNOSIS — J45909 Unspecified asthma, uncomplicated: Secondary | ICD-10-CM

## 2019-01-17 DIAGNOSIS — Z87891 Personal history of nicotine dependence: Secondary | ICD-10-CM | POA: Insufficient documentation

## 2019-01-17 DIAGNOSIS — R0602 Shortness of breath: Secondary | ICD-10-CM | POA: Insufficient documentation

## 2019-01-17 DIAGNOSIS — J4541 Moderate persistent asthma with (acute) exacerbation: Secondary | ICD-10-CM | POA: Insufficient documentation

## 2019-01-17 DIAGNOSIS — R05 Cough: Secondary | ICD-10-CM

## 2019-01-17 DIAGNOSIS — J449 Chronic obstructive pulmonary disease, unspecified: Secondary | ICD-10-CM | POA: Insufficient documentation

## 2019-01-17 MED ORDER — ALBUTEROL SULFATE HFA 108 (90 BASE) MCG/ACT IN AERS
8.0000 | INHALATION_SPRAY | Freq: Once | RESPIRATORY_TRACT | Status: AC
  Administered 2019-01-17: 8 via RESPIRATORY_TRACT
  Filled 2019-01-17: qty 6.7

## 2019-01-17 MED ORDER — IPRATROPIUM BROMIDE 0.02 % IN SOLN: 0.5000 mg | Freq: Four times a day (QID) | RESPIRATORY_TRACT | 0 refills | Status: DC | PRN

## 2019-01-17 MED ORDER — PREDNISONE 50 MG PO TABS: ORAL_TABLET | ORAL | 0 refills | Status: DC

## 2019-01-17 MED ORDER — PREDNISONE 50 MG PO TABS
60.0000 mg | ORAL_TABLET | Freq: Once | ORAL | Status: AC
  Administered 2019-01-17: 60 mg via ORAL
  Filled 2019-01-17: qty 1

## 2019-01-17 MED ORDER — ALBUTEROL SULFATE (2.5 MG/3ML) 0.083% IN NEBU: 2.5000 mg | INHALATION_SOLUTION | Freq: Four times a day (QID) | RESPIRATORY_TRACT | 0 refills | Status: DC | PRN

## 2019-01-17 NOTE — Progress Notes (Signed)
LMP 12/26/2018    Subjective:    Patient ID: Linda Ellis, female    DOB: [birth year], 43 y.o.   MRN: [id number]  HPI: Linda Ellis is a 43 y.o. female presenting on 01/17/2019 for No chief complaint on file.   HPI..  This is a telemedicine appointment through [employer] due to coronavirus pandemic  I connected with  Linda Ellis on 01/17/19 by a video enabled telemedicine application and verified that I am speaking with the correct person using two identifiers.   I discussed the limitations of evaluation and management by telemedicine. The patient expressed understanding and agreed to proceed.   Pt is at home.  Provider is at office   She went to ER last with cough and SOB.    She thinks it was due to cleaning her bathroom earlier in the day.  Pt is not working.    Pt says her partner/significant other is still working at [employer].  Pt goes out to store at least weekly and sometimes will wear a mask.   Pt was given prednisone in ER.   She had normal CXR but was not tested for CV19    Pt c/o GERD.    She got duoneb for neb from [facility]     Relevant past medical, surgical, family and social history reviewed and updated as indicated. Interim medical history since our last visit reviewed. Allergies and medications reviewed and updated.   Current Outpatient Medications:  .  albuterol (PROVENTIL HFA;VENTOLIN HFA) 108 (90 Base) MCG/ACT inhaler, Inhale 1-2 puffs into the lungs every 6 (six) hours as needed for wheezing or shortness of breath., Disp: 3 Inhaler, Rfl: 1 .  albuterol (PROVENTIL) (2.5 MG/3ML) 0.083% nebulizer solution, Take 3 mLs (2.5 mg total) by nebulization every 6 (six) hours as needed for wheezing or shortness of breath., Disp: 30 vial, Rfl: 0 .  famotidine (PEPCID) 20 MG tablet, Take 1 tablet (20 mg total) by mouth daily., Disp: 20 tablet, Rfl: 0 .  ipratropium (ATROVENT) 0.02 % nebulizer solution, Take 2.5 mLs (0.5 mg total) by nebulization every 6 (six) hours  as needed for wheezing or shortness of breath., Disp: 30 mL, Rfl: 0 .  loratadine (CLARITIN) 10 MG tablet, Take 1 tablet (10 mg total) by mouth daily., Disp: 90 tablet, Rfl: 1 .  mometasone-formoterol (DULERA) 100-5 MCG/ACT AERO, Inhale 2 puffs into the lungs 2 (two) times daily., Disp: 3 Inhaler, Rfl: 1 .  predniSONE (DELTASONE) 50 MG tablet, 1 tablet PO daily (Patient not taking: Reported on 01/17/2019), Disp: 5 tablet, Rfl: 0    Review of Systems  Per HPI unless specifically indicated above     Objective:    LMP 12/26/2018   Wt Readings from Last 3 Encounters:  01/17/19 (!) 304 lb (137.9 kg)  10/28/18 (!) 304 lb (137.9 kg)  10/27/18 (!) 304 lb (137.9 kg)    Physical Exam Constitutional:      General: She is not in acute distress.    Appearance: She is obese.  HENT:     Head: Normocephalic and atraumatic.  Pulmonary:     Effort: No respiratory distress.  Neurological:     Mental Status: She is alert and oriented to person, place, and time.  Psychiatric:        Attention and Perception: Attention normal.        Mood and Affect: Mood normal.        Speech: Speech normal.  Behavior: Behavior normal. Behavior is cooperative.         Assessment & Plan:    Encounter Diagnoses  Name Primary?  . Cough   . SOB (shortness of breath)   . Asthma, unspecified asthma severity, unspecified whether complicated, unspecified whether persistent   . Morbid obesity with BMI of 50.0-59.9, adult ([place]) Yes     -Refer to pulmonologist -will mail application for [facility] to pt -Pt will refill nebulizer meds by calling [facility] -will Refer for CV19 test in light of cough and sob -will put on Dental list -Follow up 2 months.  Pt to contact office sooner prn

## 2019-01-17 NOTE — ED Triage Notes (Signed)
Pt c/o asthma exacerbation since cleaning shower this evening.

## 2019-01-17 NOTE — Telephone Encounter (Signed)
Pt scheduled for covid testing for today at [facility] site.   Requested by [name], PA-C at [facility] [place].  Pt with cough, SOB.  Practices # [phone] [phone]  Pts CB# [phone]

## 2019-01-17 NOTE — Discharge Instructions (Signed)
Take the steroids as prescribed.  Use your nebulizer every 4 hours for the next 24 hours and then every 4 hours as needed.  Follow-up with your doctor as well as the pulmonologist regarding your frequent asthma exacerbations.  Return to the ED if you develop new or worsening symptoms.

## 2019-01-17 NOTE — ED Provider Notes (Signed)
[facility] EMERGENCY DEPARTMENT Provider Note   CSN: [id number] Arrival date & time: 01/17/19  0126     History   Chief Complaint Chief Complaint  Patient presents with  . Shortness of Breath    HPI Linda Ellis is a 43 y.o. female.     Patient with history of asthma and COPD presenting with difficulty breathing with cough and congestion.  States her asthma is acting up since she cleaned her bathroom about 12 hours ago with chemicals. States she was breathing fine before this. Her breathing became acutely worse and she used her nebulizer about 3 times at home as well as her inhaler several times.  Normally she does not use her nebulizer albuterol on a regular basis.  States compliance with her Dulera.  She states her cough is nonproductive.  No fever.  Has some chest tightness.  No leg pain or leg swelling.  She quit smoking 2 years ago.  She was admitted about 6 months ago for an asthma exacerbation.  Denies any birth control use or recent travel.  The history is provided by the patient.  Shortness of Breath Associated symptoms: cough   Associated symptoms: no abdominal pain, no chest pain, no fever, no headaches and no vomiting     Past Medical History:  Diagnosis Date  . Asthma 2008  . COPD (chronic obstructive pulmonary disease) ([place])   . GERD (gastroesophageal reflux disease)   . Multiple gastric ulcers     Patient Active Problem List   Diagnosis Date Noted  . Asthma exacerbation 07/30/2018  . Hypokalemia 07/30/2018  . Acute bronchitis 07/30/2018  . Hypoxia   . Gastroesophageal reflux disease   . Morbid obesity with BMI of 50.0-59.9, adult ([place]) 05/03/2018  . Acute respiratory failure with hypoxemia ([place]) 05/03/2018  . CAP (community acquired pneumonia) 06/24/2017  . Asthma, chronic, unspecified asthma severity, with acute exacerbation 06/24/2017  . Acute asthma exacerbation 06/24/2017    Past Surgical History:  Procedure Laterality Date  . CHOLECYSTECTOMY     . DIAGNOSTIC LAPAROSCOPY WITH REMOVAL OF ECTOPIC PREGNANCY Right 01/24/2017   Procedure: REMOVAL OF ECTOPIC PREGNANCY;  Surgeon: Florian Buff, MD;  Location: [facility];  Service: Gynecology;  Laterality: Right;  . LAPAROSCOPIC UNILATERAL SALPINGECTOMY Right 01/24/2017   Procedure: LAPAROSCOPIC UNILATERAL SALPINGECTOMY;  Surgeon: Florian Buff, MD;  Location: [facility];  Service: Gynecology;  Laterality: Right;     OB History    Gravida  5   Para  4   Term  4   Preterm      AB      Living  4     SAB      TAB      Ectopic      Multiple      Live Births               Home Medications    Prior to Admission medications   Medication Sig Start Date End Date Taking? Authorizing Provider  albuterol (PROVENTIL HFA;VENTOLIN HFA) 108 (90 Base) MCG/ACT inhaler Inhale 1-2 puffs into the lungs every 6 (six) hours as needed for wheezing or shortness of breath. 11/15/18   [name], PA-C  albuterol (PROVENTIL) (2.5 MG/3ML) 0.083% nebulizer solution Take 3 mLs (2.5 mg total) by nebulization every 6 (six) hours as needed for wheezing or shortness of breath. Patient not taking: Reported on 12/05/2018 11/15/18   [name], PA-C  famotidine (PEPCID) 20 MG tablet Take 1 tablet (20 mg total)  by mouth daily. 10/17/18   Francine Graven, DO  ipratropium (ATROVENT) 0.02 % nebulizer solution Take 2.5 mLs (0.5 mg total) by nebulization every 6 (six) hours as needed for wheezing or shortness of breath. 11/15/18   [name], PA-C  loratadine (CLARITIN) 10 MG tablet Take 1 tablet (10 mg total) by mouth daily. 11/15/18   [name], PA-C  mometasone-formoterol (DULERA) 100-5 MCG/ACT AERO Inhale 2 puffs into the lungs 2 (two) times daily. 11/15/18   [name], PA-C  predniSONE (DELTASONE) 20 MG tablet Take 1 tablet (20 mg total) by mouth 2 (two) times daily with a meal. 12/05/18   [name], PA-C  ALBUTEROL IN Inhale 2 puffs into the lungs as needed. For shortness of breath    10/24/11  [provider]    Family History Family History  Problem Relation Age of Onset  . Hypertension Father   . Diabetes Maternal Grandfather   . Heart attack Maternal Grandfather   . Stroke Maternal Grandfather     Social History Social History   Tobacco Use  . Smoking status: Former Smoker    Packs/day: 0.50    Years: 31.00    Pack years: 15.50    Types: Cigarettes    Quit date: 02/18/2017    Years since quitting: 1.9  . Smokeless tobacco: Never Used  Substance Use Topics  . Alcohol use: Not Currently    Comment: occasionally  . Drug use: No     Allergies   Pantoprazole sodium   Review of Systems Review of Systems  Constitutional: Negative for activity change, appetite change and fever.  HENT: Positive for congestion and rhinorrhea.   Respiratory: Positive for cough, chest tightness and shortness of breath.   Cardiovascular: Negative for chest pain.  Gastrointestinal: Negative for abdominal pain, nausea and vomiting.  Genitourinary: Negative for dysuria and hematuria.  Musculoskeletal: Negative for arthralgias and myalgias.  Skin: Negative for wound.  Neurological: Negative for dizziness, weakness and headaches.    all other systems are negative except as noted in the HPI and PMH.    Physical Exam Updated Vital Signs BP 137/87   Pulse (!) 114   Temp 98.3 F (36.8 C) (Oral)   Resp (!) 22   Ht 5\' 4"  (1.626 m)   Wt (!) 137.9 kg   LMP 12/26/2018   SpO2 97%   BMI 52.18 kg/m   Physical Exam Vitals signs and nursing note reviewed.  Constitutional:      General: She is in acute distress.     Appearance: She is well-developed. She is obese.     Comments: Dyspneic with conversation, tachypneic  HENT:     Head: Normocephalic and atraumatic.     Mouth/Throat:     Pharynx: No oropharyngeal exudate.  Eyes:     Conjunctiva/sclera: Conjunctivae normal.     Pupils: Pupils are equal, round, and reactive to light.  Neck:     Musculoskeletal:  Normal range of motion and neck supple.     Comments: No meningismus. Cardiovascular:     Rate and Rhythm: Normal rate and regular rhythm.     Heart sounds: Normal heart sounds. No murmur.  Pulmonary:     Effort: Respiratory distress present.     Breath sounds: Wheezing present.     Comments: Diminished breath sounds bilaterally, speaking short phrases, scattered expiratory wheezing Abdominal:     Palpations: Abdomen is soft.     Tenderness: There is no abdominal tenderness. There is no guarding or rebound.  Musculoskeletal: Normal range of motion.        General: No tenderness.  Skin:    General: Skin is warm.  Neurological:     Mental Status: She is alert and oriented to person, place, and time.     Cranial Nerves: No cranial nerve deficit.     Motor: No abnormal muscle tone.     Coordination: Coordination normal.     Comments: No ataxia on finger to nose bilaterally. No pronator drift. 5/5 strength throughout. CN 2-12 intact.Equal grip strength. Sensation intact.   Psychiatric:        Behavior: Behavior normal.      ED Treatments / Results  Labs (all labs ordered are listed, but only abnormal results are displayed) Labs Reviewed - No data to display  EKG    Radiology Dg Chest Portable 1 View  Result Date: 01/17/2019 CLINICAL DATA:  Shortness of breath EXAM: PORTABLE CHEST 1 VIEW COMPARISON:  10/17/2018 FINDINGS: Heart and mediastinal contours are within normal limits. No focal opacities or effusions. No acute bony abnormality. IMPRESSION: No active disease. Electronically Signed   By: Rolm Baptise M.D.   On: 01/17/2019 02:08    Procedures Procedures (including critical care time)  Medications Ordered in ED Medications  albuterol (VENTOLIN HFA) 108 (90 Base) MCG/ACT inhaler 8 puff (has no administration in time range)  predniSONE (DELTASONE) tablet 60 mg (has no administration in time range)     Initial Impression / Assessment and Plan / ED Course  I have  reviewed the triage vital signs and the nursing notes.  Pertinent labs & imaging results that were available during my care of the patient were reviewed by me and considered in my medical decision making (see chart for details).       Asthma exacerbation with coughing and wheezing since cleaning the bathroom about 12 hours ago.  Patient given bronchodilators and p.o. steroids.  Avoiding nebulizer during coronavirus pandemic.  She is afebrile.  She is tachycardic but not hypoxic.  After albuterol, patient feels improved.  She is able to ambulate without desaturation.  Her lungs are clear on recheck with faint scattered wheezing.  Denies any chest pain. Patient states she feels back to baseline.  She appears stable for discharge with prednisone taper as well as refill of her bronchodilators.  Advised to use her nebulizer every 4 hours for the next 4 hours and then every 4 hours as needed. Return precautions discussed.   Final Clinical Impressions(s) / ED Diagnoses   Final diagnoses:  Moderate persistent asthma with acute exacerbation    ED Discharge Orders    None       Dyllen Menning, Annie Main, MD 01/17/19 [phone]

## 2019-01-17 NOTE — ED Notes (Signed)
Pt ambulated around nursing station oxygen level was between 92-97% and heart rate in 120s. Pt states she felt fine ambulating.

## 2019-01-18 ENCOUNTER — Other Ambulatory Visit: Payer: Self-pay | Admitting: Physician Assistant

## 2019-01-18 MED ORDER — IPRATROPIUM-ALBUTEROL 0.5-2.5 (3) MG/3ML IN SOLN: 3.0000 mL | Freq: Four times a day (QID) | RESPIRATORY_TRACT | 1 refills | Status: DC | PRN

## 2019-01-20 LAB — NOVEL CORONAVIRUS, NAA: SARS-CoV-2, NAA: NOT DETECTED

## 2019-02-08 ENCOUNTER — Emergency Department (HOSPITAL_COMMUNITY)
Admission: EM | Admit: 2019-02-08 | Discharge: 2019-02-08 | Disposition: A | Discharge status: Home / Self Care | Payer: Self-pay | Attending: Emergency Medicine | Admitting: Emergency Medicine

## 2019-02-08 ENCOUNTER — Emergency Department (HOSPITAL_COMMUNITY): Payer: Self-pay

## 2019-02-08 ENCOUNTER — Encounter (HOSPITAL_COMMUNITY): Payer: Self-pay | Admitting: Emergency Medicine

## 2019-02-08 ENCOUNTER — Other Ambulatory Visit: Payer: Self-pay

## 2019-02-08 DIAGNOSIS — Z87891 Personal history of nicotine dependence: Secondary | ICD-10-CM | POA: Insufficient documentation

## 2019-02-08 DIAGNOSIS — J4541 Moderate persistent asthma with (acute) exacerbation: Secondary | ICD-10-CM | POA: Insufficient documentation

## 2019-02-08 DIAGNOSIS — J449 Chronic obstructive pulmonary disease, unspecified: Secondary | ICD-10-CM | POA: Insufficient documentation

## 2019-02-08 DIAGNOSIS — Z79899 Other long term (current) drug therapy: Secondary | ICD-10-CM | POA: Insufficient documentation

## 2019-02-08 MED ORDER — PREDNISONE 50 MG PO TABS
60.0000 mg | ORAL_TABLET | Freq: Once | ORAL | Status: AC
  Administered 2019-02-08: 60 mg via ORAL
  Filled 2019-02-08: qty 1

## 2019-02-08 MED ORDER — IPRATROPIUM-ALBUTEROL 0.5-2.5 (3) MG/3ML IN SOLN
3.0000 mL | Freq: Once | RESPIRATORY_TRACT | Status: AC
  Administered 2019-02-08: 3 mL via RESPIRATORY_TRACT
  Filled 2019-02-08: qty 3

## 2019-02-08 MED ORDER — PREDNISONE 20 MG PO TABS: 60.0000 mg | ORAL_TABLET | Freq: Every day | ORAL | 0 refills | Status: DC

## 2019-02-08 NOTE — ED Triage Notes (Signed)
Pt c/o asthma exacerbation after cooking on a charcoal grill on Sunday and inhaling smoke, reports has worsened since Sunday and reports no relief from home nebs and inhaler

## 2019-02-08 NOTE — ED Provider Notes (Signed)
[facility] EMERGENCY DEPARTMENT Provider Note   CSN: [id number] Arrival date & time: 02/08/19  0140    History   Chief Complaint Chief Complaint  Patient presents with   Shortness of Breath    HPI Linda Ellis is a 43 y.o. female.   The history is provided by the patient.  She has history of asthma, COPD, GERD, morbid obesity and comes in because of difficulty breathing for the last 2 days.  She states that 3 days ago, she had been cooking on a smoky grill 3 days ago and wonders if she had too much smoke exposure.  She has been using her home nebulizer which is giving her slight relief, but it is only lasting a couple of hours.  There has been a cough productive of small amount of clear sputum.  She denies fever, chills, sweats.  She denies nausea or vomiting.  She is a former smoker.  Past Medical History:  Diagnosis Date   Asthma 2008   COPD (chronic obstructive pulmonary disease) (HCC)    GERD (gastroesophageal reflux disease)    Multiple gastric ulcers     Patient Active Problem List   Diagnosis Date Noted   Cough 01/17/2019   SOB (shortness of breath) 01/17/2019   Asthma 01/17/2019   Asthma exacerbation 07/30/2018   Hypokalemia 07/30/2018   Acute bronchitis 07/30/2018   Hypoxia    Gastroesophageal reflux disease    Morbid obesity with BMI of 50.0-59.9, adult ([place]) 05/03/2018   Acute respiratory failure with hypoxemia ([place]) 05/03/2018   CAP (community acquired pneumonia) 06/24/2017   Asthma, chronic, unspecified asthma severity, with acute exacerbation 06/24/2017   Acute asthma exacerbation 06/24/2017    Past Surgical History:  Procedure Laterality Date   CHOLECYSTECTOMY     DIAGNOSTIC LAPAROSCOPY WITH REMOVAL OF ECTOPIC PREGNANCY Right 01/24/2017   Procedure: REMOVAL OF ECTOPIC PREGNANCY;  Surgeon: Florian Buff, MD;  Location: [facility];  Service: Gynecology;  Laterality: Right;   LAPAROSCOPIC UNILATERAL SALPINGECTOMY Right 01/24/2017   Procedure: LAPAROSCOPIC UNILATERAL SALPINGECTOMY;  Surgeon: Florian Buff, MD;  Location: [facility];  Service: Gynecology;  Laterality: Right;     OB History    Gravida  5   Para  4   Term  4   Preterm      AB      Living  4     SAB      TAB      Ectopic      Multiple      Live Births               Home Medications    Prior to Admission medications   Medication Sig Start Date End Date Taking? Authorizing Provider  albuterol (PROVENTIL HFA;VENTOLIN HFA) 108 (90 Base) MCG/ACT inhaler Inhale 1-2 puffs into the lungs every 6 (six) hours as needed for wheezing or shortness of breath. 11/15/18   [name], PA-C  albuterol (PROVENTIL) (2.5 MG/3ML) 0.083% nebulizer solution Take 3 mLs (2.5 mg total) by nebulization every 6 (six) hours as needed for wheezing or shortness of breath. 01/17/19   Rancour, Annie Main, MD  famotidine (PEPCID) 20 MG tablet Take 1 tablet (20 mg total) by mouth daily. 10/17/18   Francine Graven, DO  ipratropium (ATROVENT) 0.02 % nebulizer solution Take 2.5 mLs (0.5 mg total) by nebulization every 6 (six) hours as needed for wheezing or shortness of breath. 01/17/19   Rancour, Annie Main, MD  ipratropium-albuterol (DUONEB) 0.5-2.5 (3) MG/3ML SOLN Take 3  mLs by nebulization every 6 (six) hours as needed. 01/18/19   [name], PA-C  loratadine (CLARITIN) 10 MG tablet Take 1 tablet (10 mg total) by mouth daily. 11/15/18   [name], PA-C  mometasone-formoterol (DULERA) 100-5 MCG/ACT AERO Inhale 2 puffs into the lungs 2 (two) times daily. 11/15/18   [name], PA-C  predniSONE (DELTASONE) 50 MG tablet 1 tablet PO daily Patient not taking: Reported on 01/17/2019 01/17/19   Ezequiel Essex, MD  ALBUTEROL IN Inhale 2 puffs into the lungs as needed. For shortness of breath   10/24/11  [provider]    Family History Family History  Problem Relation Age of Onset   Hypertension Father    Diabetes Maternal Grandfather    Heart  attack Maternal Grandfather    Stroke Maternal Grandfather     Social History Social History   Tobacco Use   Smoking status: Former Smoker    Packs/day: 0.50    Years: 31.00    Pack years: 15.50    Types: Cigarettes    Quit date: 02/18/2017    Years since quitting: 1.9   Smokeless tobacco: Never Used  Substance Use Topics   Alcohol use: Not Currently    Comment: occasionally   Drug use: No     Allergies   Pantoprazole sodium   Review of Systems Review of Systems  All other systems reviewed and are negative.    Physical Exam Updated Vital Signs BP 122/79 (BP Location: Left Wrist)    Pulse (!) 105    Temp 98.3 F (36.8 C) (Oral)    Resp (!) 26    Ht 5\' 4"  (1.626 m)    Wt (!) 137.9 kg    SpO2 95%    BMI 52.18 kg/m   Physical Exam Vitals signs and nursing note reviewed.    Morbidly obese 43 year old female, resting comfortably and in no acute distress. Vital signs are significant for elevated heart rate and respiratory rate. Oxygen saturation is 95%, which is normal. Head is normocephalic and atraumatic. PERRLA, EOMI. Oropharynx is clear. Neck is nontender and supple without adenopathy or JVD. Back is nontender and there is no CVA tenderness. Lungs have faint expiratory wheezes diffusely.  There are no rales or rhonchi. Chest is nontender. Heart has regular rate and rhythm without murmur. Abdomen is soft, flat, nontender without masses or hepatosplenomegaly and peristalsis is normoactive. Extremities have no cyanosis or edema, full range of motion is present. Skin is warm and dry without rash. Neurologic: Mental status is normal, cranial nerves are intact, there are no motor or sensory deficits.  ED Treatments / Results   Radiology Dg Chest Port 1 View  Result Date: 02/08/2019 CLINICAL DATA:  Cough and shortness of breath. EXAM: PORTABLE CHEST 1 VIEW COMPARISON:  Radiograph 01/17/2019 FINDINGS: The cardiomediastinal contours are normal. Chronic bronchial  thickening. Pulmonary vasculature is normal. No consolidation, pleural effusion, or pneumothorax. No acute osseous abnormalities are seen. IMPRESSION: Chronic bronchial thickening likely related to asthma. No acute abnormality. Electronically Signed   By: Keith Rake M.D.   On: 02/08/2019 03:08    Procedures Procedures   Medications Ordered in ED Medications  ipratropium-albuterol (DUONEB) 0.5-2.5 (3) MG/3ML nebulizer solution 3 mL (3 mLs Nebulization Given 02/08/19 0302)  predniSONE (DELTASONE) tablet 60 mg (60 mg Oral Given 02/08/19 0401)  ipratropium-albuterol (DUONEB) 0.5-2.5 (3) MG/3ML nebulizer solution 3 mL (3 mLs Nebulization Given 02/08/19 0437)     Initial Impression / Assessment and Plan / ED  Course  I have reviewed the triage vital signs and the nursing notes.  Pertinent imaging results that were available during my care of the patient were reviewed by me and considered in my medical decision making (see chart for details).  Exacerbation of asthma.  Old records are reviewed, and she has fairly frequent ED visits for asthma.  Will check chest x-ray and she is given an dose of oral prednisone and given a nebulizer treatment with albuterol and ipratropium.  Chest x-ray shows no acute process, changes secondary to chronic asthma.  She had incomplete relief of symptoms with albuterol with ipratropium, so was given a second nebulizer treatment at which point she felt like she was back to her baseline.  On exam, there was minimal wheezing noted in the left upper lobe.  She was felt to be safe for discharge.  She is given a prescription for a 5-day course of prednisone, referred back to her PCP for follow-up.  Final Clinical Impressions(s) / ED Diagnoses   Final diagnoses:  Moderate persistent asthma with exacerbation    ED Discharge Orders         Ordered    predniSONE (DELTASONE) 20 MG tablet  Daily     02/08/19 7116           Delora Fuel, MD 57/90/38 [id number]

## 2019-02-13 ENCOUNTER — Encounter: Payer: Self-pay | Admitting: Pulmonary Disease

## 2019-02-13 ENCOUNTER — Ambulatory Visit (INDEPENDENT_AMBULATORY_CARE_PROVIDER_SITE_OTHER): Payer: Self-pay | Admitting: Pulmonary Disease

## 2019-02-13 ENCOUNTER — Other Ambulatory Visit: Payer: Self-pay

## 2019-02-13 VITALS — BP 118/68 | HR 101 | Temp 97.9°F | Ht 64.0 in | Wt 328.4 lb

## 2019-02-13 DIAGNOSIS — J45909 Unspecified asthma, uncomplicated: Secondary | ICD-10-CM

## 2019-02-13 LAB — CBC WITH DIFFERENTIAL/PLATELET
Basophils Absolute: 0 10*3/uL (ref 0.0–0.1)
Basophils Relative: 0.2 % (ref 0.0–3.0)
Eosinophils Absolute: 0.1 10*3/uL (ref 0.0–0.7)
Eosinophils Relative: 0.8 % (ref 0.0–5.0)
HCT: 40.4 % (ref 36.0–46.0)
Hemoglobin: 13.6 g/dL (ref 12.0–15.0)
Lymphocytes Relative: 38.3 % (ref 12.0–46.0)
Lymphs Abs: 5.9 10*3/uL — ABNORMAL HIGH (ref 0.7–4.0)
MCHC: 33.5 g/dL (ref 30.0–36.0)
MCV: 92.2 fl (ref 78.0–100.0)
Monocytes Absolute: 0.9 10*3/uL (ref 0.1–1.0)
Monocytes Relative: 6 % (ref 3.0–12.0)
Neutro Abs: 8.4 10*3/uL — ABNORMAL HIGH (ref 1.4–7.7)
Neutrophils Relative %: 54.7 % (ref 43.0–77.0)
Platelets: 419 10*3/uL — ABNORMAL HIGH (ref 150.0–400.0)
RBC: 4.38 Mil/uL (ref 3.87–5.11)
RDW: 14 % (ref 11.5–15.5)
WBC: 15.3 10*3/uL — ABNORMAL HIGH (ref 4.0–10.5)

## 2019-02-13 MED ORDER — ADVAIR HFA 230-21 MCG/ACT IN AERO: 2.0000 | INHALATION_SPRAY | Freq: Two times a day (BID) | RESPIRATORY_TRACT | 12 refills | Status: DC

## 2019-02-13 MED ORDER — IPRATROPIUM-ALBUTEROL 0.5-2.5 (3) MG/3ML IN SOLN: 3.0000 mL | Freq: Four times a day (QID) | RESPIRATORY_TRACT | 1 refills | Status: DC | PRN

## 2019-02-13 MED ORDER — ALBUTEROL SULFATE HFA 108 (90 BASE) MCG/ACT IN AERS: 1.0000 | INHALATION_SPRAY | Freq: Four times a day (QID) | RESPIRATORY_TRACT | 6 refills | Status: DC | PRN

## 2019-02-13 MED ORDER — METHYLPREDNISOLONE ACETATE 80 MG/ML IJ SUSP
120.0000 mg | Freq: Once | INTRAMUSCULAR | Status: AC
  Administered 2019-02-13: 120 mg via INTRAMUSCULAR

## 2019-02-13 NOTE — Progress Notes (Signed)
Subjective:   PATIENT ID: Linda Ellis GENDER: female DOB: 10/24/1975, MRN: [id number]   HPI  Chief Complaint  Patient presents with  . Consult    COPD, Asthma, Shortness of Breath- referred by Glynn Octave NP    Reason for Visit: New consult for asthma/COPD  [name] is a 43 year old female with no hx of childhood asthma who presents with long-term history of shortness of breath and wheezing.  Since moving to [place] in 2002, she reports developing symptoms of shortness of breath and wheezing. She does not have a regular doctor and usually presents to urgent care/ED for management. Her primary symptom is shortness of breath which is associated with wheezing and worsening nonproductive cough. She has a dry cough at baseline. Symptoms are triggered by exertion, heat and smoke. Though she does actively smoke cigarettes and denies any respiratory issues with this. She previously smoked 1 ppd but cut down in 2018, only smoking when she is stressed. She is unable to perform regular activities such as vacuuming around the house. Steroids help but continues to have symptoms while on her medications. She has a rescue inhaler and home nebulizer at home that helps with symptoms but only lasts for 15 minutes.  Her ED visits have been more frequent in the last year. She was previously admitted for one night for asthma exacerbation in December 2019 and since the start of 2020 reports 6-7 times  She was previously on [facility] for 3 months however stopped taking due to oral rashes/rashes last month. Has tried Breo (6 months), Qvar (2 years well-controlled, recently on for 2 months and stopped due to ineffectiveness) and Symbicort (1 month discontinued due to cost).    2019 Jan Feb March April May June July Aug Sept Oct Nov Dec   X X XX XX  XX X XX XXX  XX XXX  2020 Jan Feb March April May June July Aug Sept Oct Nov Dec   X X XXXX   X X        Social History: Social smoker 31  pack-years, started at age 39. Quit in 2018  Environmental exposures:  Worked in [employer] in 2010, left job due to respiratory symptoms  I have personally reviewed patient's past medical/family/social history, allergies, current medications.  Past Medical History:  Diagnosis Date  . Asthma 2008  . COPD (chronic obstructive pulmonary disease) ([place])   . GERD (gastroesophageal reflux disease)   . Multiple gastric ulcers   . Pneumonia      Family History  Problem Relation Age of Onset  . Hypertension Father   . Diabetes Maternal Grandfather   . Heart attack Maternal Grandfather   . Stroke Maternal Grandfather   . Hypertension Maternal Grandfather      Social History   Occupational History  . Not on file  Tobacco Use  . Smoking status: Current Some Day Smoker    Packs/day: 1.00    Years: 31.00    Pack years: 31.00    Types: Cigarettes    Start date: 89  . Smokeless tobacco: Never Used  . Tobacco comment: are currently randomly smoking with stress 02/13/19  Substance and Sexual Activity  . Alcohol use: Not Currently    Comment: occasionally  . Drug use: No  . Sexual activity: Not Currently    Birth control/protection: None    Allergies  Allergen Reactions  . Pantoprazole Sodium Swelling     Outpatient Medications Prior to Visit  Medication  Sig Dispense Refill  . acetaminophen (TYLENOL) 500 MG tablet Take 500-1,000 mg by mouth every 6 (six) hours as needed.    Marland Kitchen albuterol (PROVENTIL) (2.5 MG/3ML) 0.083% nebulizer solution Take 3 mLs (2.5 mg total) by nebulization every 6 (six) hours as needed for wheezing or shortness of breath. 30 vial 0  . famotidine (PEPCID) 20 MG tablet Take 1 tablet (20 mg total) by mouth daily. 20 tablet 0  . ipratropium (ATROVENT) 0.02 % nebulizer solution Take 2.5 mLs (0.5 mg total) by nebulization every 6 (six) hours as needed for wheezing or shortness of breath. 30 mL 0  . mometasone-formoterol (DULERA) 100-5 MCG/ACT AERO Inhale 2  puffs into the lungs 2 (two) times daily. 3 Inhaler 1  . predniSONE (DELTASONE) 20 MG tablet Take 3 tablets (60 mg total) by mouth daily. 15 tablet 0  . albuterol (PROVENTIL HFA;VENTOLIN HFA) 108 (90 Base) MCG/ACT inhaler Inhale 1-2 puffs into the lungs every 6 (six) hours as needed for wheezing or shortness of breath. 3 Inhaler 1  . loratadine (CLARITIN) 10 MG tablet Take 1 tablet (10 mg total) by mouth daily. (Patient not taking: Reported on 02/13/2019) 90 tablet 1  . ipratropium-albuterol (DUONEB) 0.5-2.5 (3) MG/3ML SOLN Take 3 mLs by nebulization every 6 (six) hours as needed. (Patient not taking: Reported on 02/13/2019) 360 mL 1   No facility-administered medications prior to visit.     Review of Systems  Constitutional: Negative for chills, diaphoresis, fever, malaise/fatigue and weight loss.  HENT: Negative for congestion, ear pain and sore throat.   Respiratory: Positive for cough, shortness of breath and wheezing. Negative for hemoptysis and sputum production.   Cardiovascular: Negative for chest pain, palpitations and leg swelling.  Gastrointestinal: Negative for abdominal pain, heartburn and nausea.  Genitourinary: Negative for frequency.  Musculoskeletal: Negative for joint pain and myalgias.  Skin: Negative for itching and rash.  Neurological: Negative for dizziness, weakness and headaches.  Endo/Heme/Allergies: Does not bruise/bleed easily.  Psychiatric/Behavioral: Negative for depression. The patient is not nervous/anxious.      Objective:   Vitals:   02/13/19 0929 02/13/19 0931  BP:  118/68  Pulse:  (!) 101  Temp: 97.9 F (36.6 C)   TempSrc: Oral   SpO2:  98%  Weight: (!) 328 lb 6.4 oz (149 kg)   Height: 5\' 4"  (1.626 m)    SpO2: 98 % O2 Device: None (Room air)  Physical Exam: General: BMI 56.37, well-appearing, no acute distress HENT: [place], AT Eyes: EOMI, no scleral icterus Respiratory: Expiratory wheezing, no rhonchi Cardiovascular: RRR, -M/R/G, no JVD GI:  BS+, soft, nontender Extremities: Non-pitting edema,-tenderness Neuro: AAO x4, CNII-XII grossly intact Skin: Intact, no rashes or bruising Psych: Normal mood, normal affect  Data Reviewed:  Imaging: CXR 02/08/19 - Chronic bronchial thickening CT A/P 10/17/18 - RML scarring  PFT: None on record  Labs: SARS-Covid-2 01/17/2019- not detected 10/17/2018 WBC 11.2 with N 55% (6.2) L 32% (3.6) M 7% (0.8) and E 5% (0.5)  Imaging, labs and tests noted above have been reviewed independently by me.    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 However has frequented [facility] ED nearly monthly for management of presumed asthma.   For your asthma/COPD: In the office:      --Intramuscular steroid injection given. (Methylprednisolone 120 mg)  Ordered home medications:       --START Advair HFA 2 puffs twice  a day. Take EVERYDAY      --START Albuterol 2 puffs every 4 hours as needed. This is your RESCUE inhaler. You can also take before activity as needed      --START Duonebs nebulizer as needed. This is your RESCUE nebulizer.  Ordered labs: CBC with diff, IgE, pulmonary function tests  Ordered tests: Pulmonary function test will be scheduled before your next visit with me  Follow up in 1 month  Health Maintenance Influenza 05/2018  Orders Placed This Encounter  Procedures  . CBC w/Diff    Standing Status:   Future    Number of Occurrences:   1    Standing Expiration Date:   02/13/2020  . IgE    Standing Status:   Future    Number of Occurrences:   1    Standing Expiration Date:   02/13/2020  . Pulmonary function test    Standing Status:   Future    Standing Expiration Date:   02/13/2020    Order Specific Question:   Where should this test be performed?    Answer:   [facility]    Order Specific Question:   Full PFT: includes the following: basic spirometry,  spirometry pre & post bronchodilator, diffusion capacity (DLCO), lung volumes    Answer:   FULL PFT Without spirometry post bronchodilator   Meds ordered this encounter  Medications  . methylPREDNISolone acetate (DEPO-MEDROL) injection 120 mg  . DISCONTD: fluticasone-salmeterol (ADVAIR HFA) 230-21 MCG/ACT inhaler    Sig: Inhale 2 puffs into the lungs 2 (two) times daily.    Dispense:  1 Inhaler    Refill:  12  . DISCONTD: ipratropium-albuterol (DUONEB) 0.5-2.5 (3) MG/3ML SOLN    Sig: Take 3 mLs by nebulization every 6 (six) hours as needed.    Dispense:  360 mL    Refill:  1  . DISCONTD: albuterol (VENTOLIN HFA) 108 (90 Base) MCG/ACT inhaler    Sig: Inhale 1-2 puffs into the lungs every 6 (six) hours as needed for wheezing or shortness of breath.    Dispense:  8 g    Refill:  6  . ipratropium-albuterol (DUONEB) 0.5-2.5 (3) MG/3ML SOLN    Sig: Take 3 mLs by nebulization every 6 (six) hours as needed.    Dispense:  360 mL    Refill:  1  . fluticasone-salmeterol (ADVAIR HFA) 230-21 MCG/ACT inhaler    Sig: Inhale 2 puffs into the lungs 2 (two) times daily.    Dispense:  1 Inhaler    Refill:  12  . albuterol (VENTOLIN HFA) 108 (90 Base) MCG/ACT inhaler    Sig: Inhale 1-2 puffs into the lungs every 6 (six) hours as needed for wheezing or shortness of breath.    Dispense:  8 g    Refill:  6    Return in about 1 month (around 03/16/2019).  [place], MD [facility] 02/13/2019 2:51 PM  Office Number [phone]

## 2019-02-13 NOTE — Patient Instructions (Addendum)
For your asthma/COPD: In the office:      --Intramuscular steroid injection given. (Methylprednisolone 120 mg)  Ordered home medications:       --START Advair HFA 2 puffs twice a day. Take EVERYDAY      --START Albuterol 2 puffs every 4 hours as needed. This is your RESCUE inhaler. You can also take before activity as needed      --START Duonebs nebulizer as needed. This is your RESCUE nebulizer.  Ordered labs: CBC with diff, IgE, pulmonary function tests  Ordered tests: Pulmonary function test will be scheduled before your next visit with me  Follow up in 1 month

## 2019-02-14 ENCOUNTER — Encounter: Payer: Self-pay | Admitting: Pulmonary Disease

## 2019-02-14 LAB — IGE: IgE (Immunoglobulin E), Serum: 680 kU/L — ABNORMAL HIGH (ref <=114)

## 2019-02-20 ENCOUNTER — Telehealth: Payer: Self-pay | Admitting: Pulmonary Disease

## 2019-02-20 MED ORDER — PREDNISONE 10 MG PO TABS: ORAL_TABLET | ORAL | 0 refills | Status: DC

## 2019-02-20 MED ORDER — BUDESONIDE-FORMOTEROL FUMARATE 160-4.5 MCG/ACT IN AERO: 2.0000 | INHALATION_SPRAY | Freq: Two times a day (BID) | RESPIRATORY_TRACT | 0 refills | Status: DC

## 2019-02-20 NOTE — Telephone Encounter (Signed)
Reviewed [facility] formulary and only covers Proventil, Asmanex Twistihaler, and Dulera.    She should be eligible for both Advair and Symbicort patient assistance due to not having insurance.  In the event that she is approved for Medicaid, Symbicort and Advair Diskus is preferred.  Advair HFA is non-preferred.  Do you have a preference of Advair HFA, Advair Diskus, or Symbicort?  May be advantageous to do Advair Diskus or Symbicort in the event she is approved for Medicaid.  Will follow up with patient when in office Friday.  Mariella Saa, PharmD, [place], [place] [facility] Pharmacist [phone]  02/20/2019 2:22 PM

## 2019-02-20 NOTE — Telephone Encounter (Signed)
Unsure why patient still has not been able to receive Advair HFA.  We may need to contact her pharmacy to follow-up on this as she needs to be on a maintenance inhaler.   Recurrent doses of prednisone are not the best long-term option for the patient as she is a young female current every day smoker.  Can we further investigate why she has not been able to receive her Advair?  IgE lab test performed on 02/13/2019 is elevated.Please offer: Prednisone 10mg  tablet  >>>4 tabs for 3 days, then 3 tabs for 3 days, 2 tabs for 3 days, then 1 tab for 3 days, then stop >>>take with food  >>>take in the morning   Place order.We will route to Dr. Loanne Drilling as Juluis Rainier.  We need to further investigate why the patient has been unable to obtain a maintenance inhaler.  She needs this for long-term management of her suspected asthma.  Patient also needs to keep follow-up in 1 month with Dr. Loanne Drilling.  Wyn Quaker FNP

## 2019-02-20 NOTE — Telephone Encounter (Signed)
[place] noted. Routing to JE and Lauren as Juluis Rainier.   Aaron Edelman

## 2019-02-20 NOTE — Telephone Encounter (Signed)
Triage, I know there have been a lot of messages regarding this patient.  I want to ensure the patient has received the prednisone.  Also, has patient been offered Symbicort 160 samples and can the patient present to our office to receive them?  Also need to make sure patient has scheduled follow-up with Dr. Loanne Drilling or an APP in 1 month.  Routing to [place] for follow-up to ensure patient is scheduled with Loanne Drilling.  Wyn Quaker, FNP

## 2019-02-20 NOTE — Telephone Encounter (Signed)
Called and spoke to [place], confirmed the sig is as mentioned below.

## 2019-02-20 NOTE — Telephone Encounter (Signed)
Called [facility] and when speaking to pharmacist in regards to pt still not receiving her Advair, I was told that Advair is not one medication that they carry and pt would have to get this med from local pharmacy.  Called and spoke with pt in regards to this and stated this info to her. Pt stated that she does not have any insurance and if not able to get med from [facility], she would not be able to afford the cost.  I have printed the patient assistance application for the Advair for pt to fill out to see if she might be able to receive assistance through [place] for her Advair.  Aaron Edelman, any other recs you might have for pt in regards to this?

## 2019-02-20 NOTE — Telephone Encounter (Signed)
Called [facility] to inquire about the delay with pts Advair. The pharmacy is currently closed for lunch, will need to call back after 1300.   Called pt and informed her we are calling [facility] about her Advair. Informed her of the recs per Aaron Edelman. Rx sent to preferred pharmacy. Pt does not have a current OV with JE as she is needing PFTs and has not been contacted yet to get the PFT and OV scheduled. Advised pt that she will receive a call in the next week or two to get these appts scheduled.   Will hold in triage to try [facility] again after 1pm.

## 2019-02-20 NOTE — Telephone Encounter (Signed)
Patient called requesting oral prednisone due to continued shortness of breath and dry cough.  Has not received the advair from [place] order and she confirms she did call them today regarding the order but had to leave a message and has not received a call back.    Patient is still smoking, using albuterol HFA and nebs three times a day. Fears she may 'end up back in the hospital' and this prompted her to call today.  No new symptoms: no fever, chills, aches or any other symptoms that seems any different than when she was in the office.  Was previously on oral prednisone and last dose was 02/13/19.  Also had injection of Depo in office during last visit here 02/13/19.  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 However has frequented [facility] ED nearly monthly for management of presumed asthma.   For your asthma/COPD: In the office:      --Intramuscular steroid injection given. (Methylprednisolone 120 mg)  Ordered home medications:       --START Advair HFA 2 puffs twice a day. Take EVERYDAY      --START Albuterol 2 puffs every 4 hours as needed. This is your RESCUE inhaler. You can also take before activity as needed      --START Duonebs nebulizer as needed. This is your RESCUE nebulizer.  Ordered labs: CBC with diff, IgE, pulmonary function tests  Ordered tests: Pulmonary function test will be scheduled before your next visit with me  Follow up in 1 month  Was appointment offered to patient (explain)?  Not at this time   Reason for call: Continued shortness of breath  Will route to Wyn Quaker, NP for review as app of the day.  Aaron Edelman please advise.  Thank you

## 2019-02-20 NOTE — Telephone Encounter (Signed)
I would offer patient the ability to pick up samples of Symbicort 160 for her to be started on.    I will coordinate with in office pharmacist either Delsa Sale or Amber to follow-up with the patient regarding assistance with cost.  Routing to them now.  Delsa Sale and [employer] please see last office visit note by Dr. Loanne Drilling where patient has been tried on [facility], Qvar as well as Symbicort 160.  Patient should still proceed forward with patient assistance forms for Advair.  And work closely with our pharmacist to obtain a maintenance inhaler and coverage despite her not having insurance.  Patient should also work to apply for Medicaid.  Wyn Quaker FNP

## 2019-02-20 NOTE — Telephone Encounter (Signed)
Based off that as well as the patient's history I would recommend Symbicort.  It looks like the patient was previously managed on this and this was stopped due to cost.  I would definitely prefer Symbicort 160 versus Advair Diskus.  Wyn Quaker, FNP

## 2019-02-20 NOTE — Telephone Encounter (Signed)
Linda Ellis from [place] needs clarification on Prednisone [phone]

## 2019-02-20 NOTE — Telephone Encounter (Signed)
Have already spoken with patient and verified that she has picked up the prednisone.  Have also discussed starting Symbicort 160 - patient will come to pick up samples and patient assistance forms tomorrow morning.    Patient is aware that pharmacy will follow up with her regarding her medications and will return the patient assistance forms once she has completed her portion.  Sharl Ma is aware that patient needs to be scheduled for PFT and follow up.  Patient stated that she thought that she would not qualify for Medicaid because she does not have any children under the age of 77.  She will look into this again though.  Samples documented per protocol and placed in bag with 2 patient identifiers and patient assistance application for the Symbicort.  Nothing further needed by triage at this time; will sign and route to Aaron Edelman NP to make him aware.

## 2019-02-22 ENCOUNTER — Telehealth: Payer: Self-pay | Admitting: Pharmacist

## 2019-02-22 NOTE — Telephone Encounter (Signed)
Please check-in on status of PFT scheduling and follow-up. Linda Ellis is aware of need per documentation on 02/20/19.

## 2019-02-22 NOTE — Progress Notes (Signed)
Spoke to patient to coordinate patient assistance program application. Faxed completed application to [employer] for Symbicort. Will follow up with [facility] within 1 week to check on status and follow up with patient, she did confirm having current inhaler supply with Symbicort samples and access to rescue inhaler (albuterol) through [facility]. Advised patient to contact clinic if any questions. Patient verbalized understanding.

## 2019-02-23 NOTE — Telephone Encounter (Signed)
Message sent to [facility], will get patient scheduled as soon as possible.

## 2019-02-27 ENCOUNTER — Other Ambulatory Visit: Payer: Self-pay | Admitting: Pulmonary Disease

## 2019-03-01 ENCOUNTER — Ambulatory Visit: Payer: Self-pay | Admitting: Physician Assistant

## 2019-03-01 ENCOUNTER — Encounter: Payer: Self-pay | Admitting: Physician Assistant

## 2019-03-01 ENCOUNTER — Encounter: Payer: Self-pay | Admitting: Pharmacist

## 2019-03-01 DIAGNOSIS — R059 Cough, unspecified: Secondary | ICD-10-CM

## 2019-03-01 DIAGNOSIS — R0602 Shortness of breath: Secondary | ICD-10-CM

## 2019-03-01 DIAGNOSIS — R05 Cough: Secondary | ICD-10-CM

## 2019-03-01 DIAGNOSIS — J45909 Unspecified asthma, uncomplicated: Secondary | ICD-10-CM

## 2019-03-01 DIAGNOSIS — Z20822 Contact with and (suspected) exposure to covid-19: Secondary | ICD-10-CM

## 2019-03-01 NOTE — Progress Notes (Signed)
There were no vitals taken for this visit.   Subjective:    Patient ID: Linda Ellis, female    DOB: October 18, 1975, 43 y.o.   MRN: [id number]  HPI: Linda Ellis is a 43 y.o. female presenting on 03/01/2019 for No chief complaint on file.   HPI   Chief Complaint  Patient presents with  . Cough    for about 2 days. pt states she is coughing up brown/ tanish color mucous. pt states she is taking dayquil gels and theraflu which were helping but did not help last night.     No fevers.  She is taking dayquil.  Sick x 4 day. Coughing x 2 day She says it started in her head and then went to her chest.  She does feel somewhat sob.   She doesn't work.   She got sick after being around a sick friend (who was not tested for covid)    Pt got shot steroids/depo-medrol by dr Loanne Drilling on 7/13.  Pt says had prednisone after that but she cant remember the dates.   She is scheduled for covid testing tomorrow prior to PFTs next week.    Relevant past medical, surgical, family and social history reviewed and updated as indicated. Interim medical history since our last visit reviewed. Allergies and medications reviewed and updated.   Current Outpatient Medications:  .  acetaminophen (TYLENOL) 500 MG tablet, Take 500-1,000 mg by mouth every 6 (six) hours as needed., Disp: , Rfl:  .  albuterol (PROVENTIL) (2.5 MG/3ML) 0.083% nebulizer solution, Take 3 mLs (2.5 mg total) by nebulization every 6 (six) hours as needed for wheezing or shortness of breath., Disp: 30 vial, Rfl: 0 .  albuterol (VENTOLIN HFA) 108 (90 Base) MCG/ACT inhaler, Inhale 1-2 puffs into the lungs every 6 (six) hours as needed for wheezing or shortness of breath., Disp: 8 g, Rfl: 6 .  budesonide-formoterol (SYMBICORT) 160-4.5 MCG/ACT inhaler, Inhale 2 puffs into the lungs 2 (two) times daily., Disp: 2 Inhaler, Rfl: 0 .  famotidine (PEPCID) 20 MG tablet, Take 1 tablet (20 mg total) by mouth daily. (Patient taking differently: Take  20 mg by mouth daily as needed. ), Disp: 20 tablet, Rfl: 0 .  ipratropium-albuterol (DUONEB) 0.5-2.5 (3) MG/3ML SOLN, Take 3 mLs by nebulization every 6 (six) hours as needed., Disp: 360 mL, Rfl: 1 .  loratadine (CLARITIN) 10 MG tablet, Take 1 tablet (10 mg total) by mouth daily. (Patient taking differently: Take 10 mg by mouth daily as needed. ), Disp: 90 tablet, Rfl: 1 .  fluticasone-salmeterol (ADVAIR HFA) 230-21 MCG/ACT inhaler, Inhale 2 puffs into the lungs 2 (two) times daily. (Patient not taking: Reported on 03/01/2019), Disp: 1 Inhaler, Rfl: 12 .  ipratropium (ATROVENT) 0.02 % nebulizer solution, Take 2.5 mLs (0.5 mg total) by nebulization every 6 (six) hours as needed for wheezing or shortness of breath. (Patient not taking: Reported on 03/01/2019), Disp: 30 mL, Rfl: 0 .  mometasone-formoterol (DULERA) 100-5 MCG/ACT AERO, Inhale 2 puffs into the lungs 2 (two) times daily. (Patient not taking: Reported on 03/01/2019), Disp: 3 Inhaler, Rfl: 1    Review of Systems  Per HPI unless specifically indicated above     Objective:    There were no vitals taken for this visit.  Wt Readings from Last 3 Encounters:  02/13/19 (!) 328 lb 6.4 oz (149 kg)  02/08/19 (!) 303 lb 15.9 oz (137.9 kg)  01/17/19 (!) 304 lb (137.9 kg)    Physical Exam  Constitutional:      General: She is not in acute distress.    Appearance: She is obese. She is not toxic-appearing.  HENT:     Head: Normocephalic and atraumatic.     Nose: Rhinorrhea present.  Pulmonary:     Effort: No respiratory distress.     Comments: Pt is able to speak in complete sentences without SOB.  Neurological:     Mental Status: She is alert and oriented to person, place, and time.  Psychiatric:        Attention and Perception: Attention normal.        Speech: Speech normal.        Behavior: Behavior is cooperative.          Assessment & Plan:   Encounter Diagnoses  Name Primary?  . Cough Yes  . SOB (shortness of breath)   .  Suspected 2019 Novel Coronavirus Infection   . Asthma, unspecified asthma severity, unspecified whether complicated, unspecified whether persistent        -Pt has inhalers and nebulizers to use -recommend she get covid testing today- order placed -recommended she notify pulmonology that she is sick as they will likely want to delay PFTs due to sickness regardless of covid result -counseled pt on self-isolation until she gets her test result and to contact office of go to ER for problems breathing

## 2019-03-01 NOTE — Progress Notes (Signed)
Noted. Thank you.   Linda Ellis

## 2019-03-01 NOTE — Progress Notes (Signed)
Patient was approved by manufacturer [facility] [phone]) for a temporary (53-month) supply of Symbicort due to pending Medicaid denial letter. Once patient submits Medicaid denial letter, she will be eligible for 1-year supply of Symbicort. Contacted patient and notified her. She states she will apply for Medicaid today and reach back out to pulmonary clinic if/once she receives denial letter from [facility]. For now, patient will be receiving shipment of 51-month supply of Symbicort from [facility]. Advised patient to contact clinic if any questions and she verbalized understanding.

## 2019-03-02 ENCOUNTER — Emergency Department (HOSPITAL_COMMUNITY)
Admission: EM | Admit: 2019-03-02 | Discharge: 2019-03-02 | Disposition: A | Discharge status: Home / Self Care | Payer: Self-pay | Attending: Emergency Medicine | Admitting: Emergency Medicine

## 2019-03-02 ENCOUNTER — Telehealth: Payer: Self-pay | Admitting: Pulmonary Disease

## 2019-03-02 ENCOUNTER — Other Ambulatory Visit: Payer: Self-pay

## 2019-03-02 ENCOUNTER — Encounter (HOSPITAL_COMMUNITY): Payer: Self-pay | Admitting: Emergency Medicine

## 2019-03-02 ENCOUNTER — Other Ambulatory Visit (HOSPITAL_COMMUNITY)
Admission: RE | Admit: 2019-03-02 | Discharge: 2019-03-02 | Disposition: A | Discharge status: Home / Self Care | Payer: HRSA Program | Source: Ambulatory Visit | Attending: Pulmonary Disease | Admitting: Pulmonary Disease

## 2019-03-02 DIAGNOSIS — Z20828 Contact with and (suspected) exposure to other viral communicable diseases: Secondary | ICD-10-CM | POA: Diagnosis present

## 2019-03-02 DIAGNOSIS — J4551 Severe persistent asthma with (acute) exacerbation: Secondary | ICD-10-CM

## 2019-03-02 DIAGNOSIS — R0602 Shortness of breath: Secondary | ICD-10-CM | POA: Insufficient documentation

## 2019-03-02 DIAGNOSIS — Z7952 Long term (current) use of systemic steroids: Secondary | ICD-10-CM

## 2019-03-02 DIAGNOSIS — Z5321 Procedure and treatment not carried out due to patient leaving prior to being seen by health care provider: Secondary | ICD-10-CM | POA: Insufficient documentation

## 2019-03-02 LAB — SARS CORONAVIRUS 2 (TAT 6-24 HRS): SARS Coronavirus 2: NEGATIVE

## 2019-03-02 MED ORDER — PREDNISONE 10 MG PO TABS: ORAL_TABLET | ORAL | 0 refills | Status: DC

## 2019-03-02 NOTE — Telephone Encounter (Signed)
Attempted to call pt but unable to reach. Left message for pt to return call. 

## 2019-03-02 NOTE — Telephone Encounter (Signed)
Called and spoke with pt. Pt stated she has been coughing x5 days. Pt said the cough first began while she was still on prednisone. Pt finished prednisone 2 days ago. Pt's breathing progressively became worse once she was off of the prednisone.  Due to the cough, pt has not been able to sleep at night as the cough is keeping her up at night. Pt is coughing up brown phlegm. Pt also has complaints of wheezing, tightness in chest, and increased SOB.  Pt said just today 7/30 she has already had to use her rescue inhaler at least 8 times and has also used her nebulizer twice today. Pt is using her symbicort inhaler as prescribed.  Pt said that she went to the  ER this morning 7/30 but due to the long wait and unable to breathe with the mask on, pt left the ER before being seen.  Pt denies any complaints of fever.  Prior to calling our office, pt said she called PCP and pt said that they pretty much brushed her off and told her to go be tested for COVID.  Pt is already scheduled to have COVID test done today at 11am due to having PFT scheduled for 8/4 but pt said she is unsure if she will be able to do the PFT.  Dr. Loanne Drilling, please advise on this for pt what you recommend. Thanks!

## 2019-03-02 NOTE — Telephone Encounter (Signed)
Sig: Take 6 tablets x three days (60 mg), followed by 5 tablets x three days (50mg ), then 4 tablets x three day(40mg ), then 3 tablets (30mg ) x three days, then 2 tablets (20mg ) x three days, then 1 tablet (10mg ) x three days, then STOP  The quantity should be 63 tabs instead of 60 tabs.  Called pt's pharmacy and spoke with [facility] pharmacist letting her know that the quantity for pt's pred Rx should be 63 instead of 60. Larene Beach verbalized understanding and said she would fix it on the Rx. Nothing further needed.

## 2019-03-02 NOTE — ED Triage Notes (Signed)
Pt c/o SOB x 5 days, reports she has used home nebs and inhalers with no relief, pt was recommended for COVID test yesterday but has not been to be tested yet

## 2019-03-02 NOTE — Telephone Encounter (Signed)
I called patient to re-verify patient. She reports head congestion --> moved to chest. Reports coughing and wheezing x 5days. Denies fevers, chills. Recent sick contact who tested negative for COVID-19  Plan Steroid taper ordered for patient's roommate to pick up Increase Symbicort 2 puff TID for 2 days Keep follow-up with me next week Will discuss biologic eligibility at next visit  No further action from hospital staff

## 2019-03-02 NOTE — Telephone Encounter (Signed)
Patient is returning phone call. Patient phone number is [phone]. 

## 2019-03-02 NOTE — ED Notes (Signed)
Pt called for room with no response.  °

## 2019-03-07 ENCOUNTER — Ambulatory Visit: Payer: Self-pay | Admitting: Pulmonary Disease

## 2019-03-17 ENCOUNTER — Other Ambulatory Visit: Payer: Self-pay | Admitting: Pulmonary Disease

## 2019-03-27 ENCOUNTER — Encounter: Payer: Self-pay | Admitting: Physician Assistant

## 2019-03-27 ENCOUNTER — Ambulatory Visit: Payer: Self-pay | Admitting: Physician Assistant

## 2019-03-27 DIAGNOSIS — Z13220 Encounter for screening for lipoid disorders: Secondary | ICD-10-CM

## 2019-03-27 DIAGNOSIS — Z131 Encounter for screening for diabetes mellitus: Secondary | ICD-10-CM

## 2019-03-27 DIAGNOSIS — J45909 Unspecified asthma, uncomplicated: Secondary | ICD-10-CM

## 2019-03-27 MED ORDER — PANTOPRAZOLE SODIUM 40 MG PO TBEC: 40.0000 mg | DELAYED_RELEASE_TABLET | Freq: Every day | ORAL | 3 refills | Status: DC | PRN

## 2019-03-27 NOTE — Progress Notes (Signed)
There were no vitals taken for this visit.   Subjective:    Patient ID: Linda Ellis, female    DOB: 03-Mar-1976, 43 y.o.   MRN: [id number]  HPI: Linda Ellis is a 43 y.o. female presenting on 03/27/2019 for Asthma   HPI   This is a telemedicine appointment through [facility] due to coronavirus pandemic  I connected with  Linda Ellis on 03/27/19 by a video enabled telemedicine application and verified that I am speaking with the correct person using two identifiers.   I discussed the limitations of evaluation and management by telemedicine. The patient expressed understanding and agreed to proceed.   Pt is at home.  Provider is at office.     Pt says dulera  Was discontinued by pulmonologist and she was put on symbicort  Pt states her breathing is good  Pt req rx for protonix.  She Says it works better than pepcid.   Pt has protonix listed as allergy.  She says she thought that was what made her feet swell up.  But her stomach was bothering her so she started taking them again and she had no problems with it  Pt is not working  She is still not smoking.     Last PAP - 2 year ago in ER-  Before that would be [place] more than 2 year ago.   Discussed with pt that ER doesn't do routine screening tests.      Relevant past medical, surgical, family and social history reviewed and updated as indicated. Interim medical history since our last visit reviewed. Allergies and medications reviewed and updated.   Current Outpatient Medications:  .  acetaminophen (TYLENOL) 500 MG tablet, Take 500-1,000 mg by mouth every 6 (six) hours as needed., Disp: , Rfl:  .  albuterol (VENTOLIN HFA) 108 (90 Base) MCG/ACT inhaler, Inhale 1-2 puffs into the lungs every 6 (six) hours as needed for wheezing or shortness of breath., Disp: 8 g, Rfl: 6 .  budesonide-formoterol (SYMBICORT) 160-4.5 MCG/ACT inhaler, Inhale 2 puffs into the lungs 2 (two) times daily., Disp: 2 Inhaler, Rfl: 0 .  famotidine  (PEPCID) 20 MG tablet, Take 1 tablet (20 mg total) by mouth daily. (Patient taking differently: Take 20 mg by mouth daily as needed. ), Disp: 20 tablet, Rfl: 0 .  ipratropium-albuterol (DUONEB) 0.5-2.5 (3) MG/3ML SOLN, Take 3 mLs by nebulization every 6 (six) hours as needed., Disp: 360 mL, Rfl: 1 .  loratadine (CLARITIN) 10 MG tablet, Take 1 tablet (10 mg total) by mouth daily. (Patient taking differently: Take 10 mg by mouth daily as needed. ), Disp: 90 tablet, Rfl: 1    Review of Systems  Per HPI unless specifically indicated above     Objective:    There were no vitals taken for this visit.  Wt Readings from Last 3 Encounters:  03/02/19 (!) 328 lb (148.8 kg)  02/13/19 (!) 328 lb 6.4 oz (149 kg)  02/08/19 (!) 303 lb 15.9 oz (137.9 kg)    Physical Exam Constitutional:      General: She is not in acute distress.    Appearance: She is obese. She is not ill-appearing.  HENT:     Head: Normocephalic and atraumatic.  Pulmonary:     Effort: Pulmonary effort is normal. No respiratory distress.  Neurological:     Mental Status: She is alert and oriented to person, place, and time.  Psychiatric:        Attention and Perception: Attention  normal.        Speech: Speech normal.        Behavior: Behavior is cooperative.         Assessment & Plan:   Encounter Diagnoses  Name Primary?  Marland Kitchen Asthma, unspecified asthma severity, unspecified whether complicated, unspecified whether persistent Yes  . Morbid obesity with BMI of 50.0-59.9, adult ([place])   . Screening cholesterol level   . Screening for diabetes mellitus       -Will check Lipids, a1c.  Pt will be called with results -pt to continue with pulmonologist per their recommendations -rx protonix sent per pt request -pt to follow up in 2 months to update PAP.  Pt to contact office sooner prn

## 2019-04-13 ENCOUNTER — Telehealth: Payer: Self-pay | Admitting: Pulmonary Disease

## 2019-04-13 NOTE — Telephone Encounter (Signed)
Called the patient back and she said she was able to figure it out. The denial related to her obtaining assistance to cover the cost of the Symbicort. She was told a denial letter from her insurance was required for patient assistance. Patient stated she filled out the related paperwork and was told that once she had the letter available to bring it to our office.  Advised the patient to make a copy of the letter for herself and drop off letter at front door in envelope with Dr. Cordelia Pen name on it to make sure it is received.  Patient voiced understanding. Nothing further needed at this time.

## 2019-04-14 ENCOUNTER — Telehealth: Payer: Self-pay | Admitting: Pulmonary Disease

## 2019-04-14 NOTE — Telephone Encounter (Signed)
Noted. Form obtained. Form is a approval form for medicaid. She reports they were trying get her some assistance with her medication and they needed additional documentation. She reports the form need to be faxed. I asked if she has filled out a paper assistance application. She states whoever she spoke with told her we have everything we need and there was nothing for her to sign. I made her aware in order to enroll her in patient assistance she will need to sign the application. She voices she will come sign it today. Application placed upfront. Will update note upon patient coming in to sign form.

## 2019-04-17 NOTE — Telephone Encounter (Signed)
Patient assistance application as well as insurance form faxed to [place] and [place]. Will send to scan to be uploaded in chart. Nothing further needed at this time.

## 2019-04-17 NOTE — Telephone Encounter (Signed)
Forms are still up front have not been signed. Will follow up.

## 2019-04-21 ENCOUNTER — Telehealth: Payer: Self-pay | Admitting: Pulmonary Disease

## 2019-04-21 NOTE — Telephone Encounter (Signed)
Called spoke with patient. She states she will see if it goes away or get worse. I told her to seek emergency care if she gets worse or call our office back for an appointment. With lab work  Patient voiced understanding Nothing further needed

## 2019-04-21 NOTE — Telephone Encounter (Signed)
Primary Pulmonologist: JE Last office visit and with whom: 02/13/19 JE What do we see them for (pulmonary problems): asthma Last OV assessment/plan:   "Plan Steroid taper ordered for patient's roommate to pick up Increase Symbicort 2 puff TID for 2 days Keep follow-up with me next week Will discuss biologic eligibility at next visit"  Was appointment offered to patient (explain)?  Patient wants to know if she needs to come in for this   Reason for call:   Called spoke with patient. She states she has been on symbicort for 2 months. She states she was on prednisone a while back and noticed she had bruising. She called her PCP and they told her it was from the prednisone.   Patient googled side effects on symbicort and saw that easily bruised was one. She called because patient had noticed a bruise on her thigh last night before bed the size of a 50 cent piece. It was itching and when she woke up it was 3x bigger. She is not sure if it is from symbicort or her itching or why she is bruising.      TP please advise.

## 2019-04-21 NOTE — Telephone Encounter (Signed)
Typically bruising from Symbicort should not be severe , only mild . If she is having excessively bruising will need to be checked and most likely have labs with [facility] .  Please contact office for sooner follow up if symptoms do not improve or worsen or seek emergency care

## 2019-04-25 ENCOUNTER — Ambulatory Visit (INDEPENDENT_AMBULATORY_CARE_PROVIDER_SITE_OTHER): Payer: Self-pay | Admitting: Adult Health

## 2019-04-25 ENCOUNTER — Encounter: Payer: Self-pay | Admitting: Adult Health

## 2019-04-25 ENCOUNTER — Other Ambulatory Visit: Payer: Self-pay | Admitting: *Deleted

## 2019-04-25 ENCOUNTER — Other Ambulatory Visit: Payer: Self-pay

## 2019-04-25 ENCOUNTER — Telehealth: Payer: Self-pay | Admitting: Pulmonary Disease

## 2019-04-25 DIAGNOSIS — J45909 Unspecified asthma, uncomplicated: Secondary | ICD-10-CM

## 2019-04-25 DIAGNOSIS — Z20822 Contact with and (suspected) exposure to covid-19: Secondary | ICD-10-CM

## 2019-04-25 DIAGNOSIS — R0602 Shortness of breath: Secondary | ICD-10-CM

## 2019-04-25 MED ORDER — AZITHROMYCIN 250 MG PO TABS: ORAL_TABLET | ORAL | 0 refills | Status: DC

## 2019-04-25 MED ORDER — AZITHROMYCIN 250 MG PO TABS: ORAL_TABLET | ORAL | 0 refills | Status: AC

## 2019-04-25 MED ORDER — PREDNISONE 10 MG PO TABS: ORAL_TABLET | ORAL | 0 refills | Status: DC

## 2019-04-25 MED ORDER — PROMETHAZINE-CODEINE 6.25-10 MG/5ML PO SYRP: 5.0000 mL | ORAL_SOLUTION | Freq: Four times a day (QID) | ORAL | 0 refills | Status: DC | PRN

## 2019-04-25 NOTE — Progress Notes (Signed)
Virtual Visit via Telephone Note  I connected with Linda Ellis on 04/25/19 at 11:00 AM EDT by telephone and verified that I am speaking with the correct person using two identifiers.  Location: Patient: Home  Provider: Office    I discussed the limitations, risks, security and privacy concerns of performing an evaluation and management service by telephone and the availability of in person appointments. I also discussed with the patient that there may be a patient responsible charge related to this service. The patient expressed understanding and agreed to proceed.   History of Present Illness: 43 year old female former smoker (quit August 2020) followed for asthma Today's tele-visit as an acute office visit.  Patient complains of 2 days of cough, fever, nasal congestion sore throat and productive cough with clear mucus.  She has increased wheezing and chest tightness.  She denies any hemoptysis, chest pain orthopnea PND leg swelling.  No recent travel or antibiotic use.  She is on Symbicort Twice daily  . Recently increased to Three times a day  Due to increased cough . Has been using DayQuil . Recently exposed to others with similar symptoms.  Cough is keepin g her up at night .  Gets meds through patient assistance .  Appetite is lower.  No loss of smell or taste.    Observations/Objective: Talks in full sentences with no audible wheezing.    Assessment and Plan: Asthma Flare with URI  Will given phenergan with codeine As needed  Cough , advised on sedating effects (PMP reviewed )   Plan  Patient Instructions  Z-Pak to have on hold if symptoms worsen with discolored mucus Mucinex DM twice daily as needed for cough and congestion Prednisone taper over the next week Claritin 10 mg daily as needed for drainage Fluids and rest Flonase nasal spray 2 puffs daily x1 week and then as needed Continue on Symbicort 2 puffs twice daily. May use DuoNeb nebulizer every 6 hours as needed  for cough and wheezing COVID-19 testing as discussed today Follow-up with Dr. Loanne Drilling in 4 to 6 weeks and as needed Please contact office for sooner follow up if symptoms do not improve or worsen or seek emergency care ]      Follow Up Instructions: Follow up with Dr. Loanne Drilling in 4-6 weeks and As needed     I discussed the assessment and treatment plan with the patient. The patient was provided an opportunity to ask questions and all were answered. The patient agreed with the plan and demonstrated an understanding of the instructions.   The patient was advised to call back or seek an in-person evaluation if the symptoms worsen or if the condition fails to improve as anticipated.  I provided 22 minutes of non-face-to-face time during this encounter.   Rexene Edison, NP

## 2019-04-25 NOTE — Telephone Encounter (Signed)
I sent in another Rx for the patient with the diagnosis code added. Nothing further is needed.  DX: [id number]

## 2019-04-25 NOTE — Patient Instructions (Addendum)
Z-Pak to have on hold if symptoms worsen with discolored mucus Mucinex DM twice daily as needed for cough and congestion Prednisone taper over the next week Claritin 10 mg daily as needed for drainage Fluids and rest Phenergan with codeine cough syrup 1 teaspoon every 6 hours as needed for cough.  May make you sleepy. Flonase nasal spray 2 puffs daily x1 week and then as needed Continue on Symbicort 2 puffs twice daily. May use DuoNeb nebulizer every 6 hours as needed for cough and wheezing COVID-19 testing as discussed today Self isolate and quarantine as discussed Follow-up with Dr. Loanne Drilling in 4 to 6 weeks and as needed Please contact office for sooner follow up if symptoms do not improve or worsen or seek emergency care ]       Person Under Monitoring Name: Linda Ellis  Location: [address] [place] [address]   Infection Prevention Recommendations for Individuals Confirmed to have, or Being Evaluated for, 2019 Novel Coronavirus (COVID-19) Infection Who Receive Care at Home  Individuals who are confirmed to have, or are being evaluated for, COVID-19 should follow the prevention steps below until a healthcare provider or local or state health department says they can return to normal activities.  Stay home except to get medical care You should restrict activities outside your home, except for getting medical care. Do not go to work, school, or public areas, and do not use public transportation or taxis.  Call ahead before visiting your doctor Before your medical appointment, call the healthcare provider and tell them that you have, or are being evaluated for, COVID-19 infection. This will help the healthcare provider's office take steps to keep other people from getting infected. Ask your healthcare provider to call the local or state health department.  Monitor your symptoms Seek prompt medical attention if your illness is worsening (e.g., difficulty  breathing). Before going to your medical appointment, call the healthcare provider and tell them that you have, or are being evaluated for, COVID-19 infection. Ask your healthcare provider to call the local or state health department.  Wear a facemask You should wear a facemask that covers your nose and mouth when you are in the same room with other people and when you visit a healthcare provider. People who live with or visit you should also wear a facemask while they are in the same room with you.  Separate yourself from other people in your home As much as possible, you should stay in a different room from other people in your home. Also, you should use a separate bathroom, if available.  Avoid sharing household items You should not share dishes, drinking glasses, cups, eating utensils, towels, bedding, or other items with other people in your home. After using these items, you should wash them thoroughly with soap and water.  Cover your coughs and sneezes Cover your mouth and nose with a tissue when you cough or sneeze, or you can cough or sneeze into your sleeve. Throw used tissues in a lined trash can, and immediately wash your hands with soap and water for at least 20 seconds or use an alcohol-based hand rub.  Wash your [facility] your hands often and thoroughly with soap and water for at least 20 seconds. You can use an alcohol-based hand sanitizer if soap and water are not available and if your hands are not visibly dirty. Avoid touching your eyes, nose, and mouth with unwashed hands.   Prevention Steps for Caregivers  and Household Members of Individuals Confirmed to have, or Being Evaluated for, COVID-19 Infection Being Cared for in the Home  If you live with, or provide care at home for, a person confirmed to have, or being evaluated for, COVID-19 infection please follow these guidelines to prevent infection:  Follow healthcare provider's instructions Make sure that you  understand and can help the patient follow any healthcare provider instructions for all care.  Provide for the patient's basic needs You should help the patient with basic needs in the home and provide support for getting groceries, prescriptions, and other personal needs.  Monitor the patient's symptoms If they are getting sicker, call his or her medical provider and tell them that the patient has, or is being evaluated for, COVID-19 infection. This will help the healthcare provider's office take steps to keep other people from getting infected. Ask the healthcare provider to call the local or state health department.  Limit the number of people who have contact with the patient  If possible, have only one caregiver for the patient.  Other household members should stay in another home or place of residence. If this is not possible, they should stay  in another room, or be separated from the patient as much as possible. Use a separate bathroom, if available.  Restrict visitors who do not have an essential need to be in the home.  Keep older adults, very young children, and other sick people away from the patient Keep older adults, very young children, and those who have compromised immune systems or chronic health conditions away from the patient. This includes people with chronic heart, lung, or kidney conditions, diabetes, and cancer.  Ensure good ventilation Make sure that shared spaces in the home have good air flow, such as from an air conditioner or an opened window, weather permitting.  Wash your hands often  Wash your hands often and thoroughly with soap and water for at least 20 seconds. You can use an alcohol based hand sanitizer if soap and water are not available and if your hands are not visibly dirty.  Avoid touching your eyes, nose, and mouth with unwashed hands.  Use disposable paper towels to dry your hands. If not available, use dedicated cloth towels and replace  them when they become wet.  Wear a facemask and gloves  Wear a disposable facemask at all times in the room and gloves when you touch or have contact with the patient's blood, body fluids, and/or secretions or excretions, such as sweat, saliva, sputum, nasal mucus, vomit, urine, or feces.  Ensure the mask fits over your nose and mouth tightly, and do not touch it during use.  Throw out disposable facemasks and gloves after using them. Do not reuse.  Wash your hands immediately after removing your facemask and gloves.  If your personal clothing becomes contaminated, carefully remove clothing and launder. Wash your hands after handling contaminated clothing.  Place all used disposable facemasks, gloves, and other waste in a lined container before disposing them with other household waste.  Remove gloves and wash your hands immediately after handling these items.  Do not share dishes, glasses, or other household items with the patient  Avoid sharing household items. You should not share dishes, drinking glasses, cups, eating utensils, towels, bedding, or other items with a patient who is confirmed to have, or being evaluated for, COVID-19 infection.  After the person uses these items, you should wash them thoroughly with soap and water.  [employer]  thoroughly  Immediately remove and wash clothes or bedding that have blood, body fluids, and/or secretions or excretions, such as sweat, saliva, sputum, nasal mucus, vomit, urine, or feces, on them.  Wear gloves when handling laundry from the patient.  Read and follow directions on labels of laundry or clothing items and detergent. In general, wash and dry with the warmest temperatures recommended on the label.  Clean all areas the individual has used often  Clean all touchable surfaces, such as counters, tabletops, doorknobs, bathroom fixtures, toilets, phones, keyboards, tablets, and bedside tables, every day. Also, clean any surfaces that  may have blood, body fluids, and/or secretions or excretions on them.  Wear gloves when cleaning surfaces the patient has come in contact with.  Use a diluted bleach solution (e.g., dilute bleach with 1 part bleach and 10 parts water) or a household disinfectant with a label that says EPA-registered for coronaviruses. To make a bleach solution at home, add 1 tablespoon of bleach to 1 quart (4 cups) of water. For a larger supply, add  cup of bleach to 1 gallon (16 cups) of water.  Read labels of cleaning products and follow recommendations provided on product labels. Labels contain instructions for safe and effective use of the cleaning product including precautions you should take when applying the product, such as wearing gloves or eye protection and making sure you have good ventilation during use of the product.  Remove gloves and wash hands immediately after cleaning.  Monitor yourself for signs and symptoms of illness Caregivers and household members are considered close contacts, should monitor their health, and will be asked to limit movement outside of the home to the extent possible. Follow the monitoring steps for close contacts listed on the symptom monitoring form.   ? If you have additional questions, contact your local health department or call the epidemiologist on call at [phone] (available 24/7). ? This guidance is subject to change. For the most up-to-date guidance from [facility], please refer to their website: [email or web address]

## 2019-04-25 NOTE — Addendum Note (Signed)
Addended by: Parke Poisson E on: 04/25/2019 11:50 AM   Modules accepted: Orders

## 2019-04-27 LAB — NOVEL CORONAVIRUS, NAA: SARS-CoV-2, NAA: NOT DETECTED

## 2019-05-05 ENCOUNTER — Other Ambulatory Visit: Payer: Self-pay | Admitting: Physician Assistant

## 2019-05-09 ENCOUNTER — Ambulatory Visit: Payer: Self-pay | Admitting: Physician Assistant

## 2019-05-09 ENCOUNTER — Other Ambulatory Visit: Payer: Self-pay | Admitting: Physician Assistant

## 2019-05-09 MED ORDER — IPRATROPIUM-ALBUTEROL 0.5-2.5 (3) MG/3ML IN SOLN: 3.0000 mL | Freq: Four times a day (QID) | RESPIRATORY_TRACT | 1 refills | Status: DC | PRN

## 2019-05-09 MED ORDER — LORATADINE 10 MG PO TABS: 10.0000 mg | ORAL_TABLET | Freq: Every day | ORAL | 0 refills | Status: DC | PRN

## 2019-05-11 ENCOUNTER — Other Ambulatory Visit: Payer: Self-pay | Admitting: *Deleted

## 2019-05-11 DIAGNOSIS — Z20822 Contact with and (suspected) exposure to covid-19: Secondary | ICD-10-CM

## 2019-05-12 LAB — NOVEL CORONAVIRUS, NAA: SARS-CoV-2, NAA: NOT DETECTED

## 2019-05-17 ENCOUNTER — Ambulatory Visit: Payer: Self-pay | Admitting: Physician Assistant

## 2019-05-23 ENCOUNTER — Ambulatory Visit: Payer: Self-pay | Admitting: Pulmonary Disease

## 2019-05-23 NOTE — Progress Notes (Deleted)
Subjective:   PATIENT ID: Linda Ellis GENDER: female DOB: [birth year], MRN: [id number]   HPI  No chief complaint on file.   Reason for Visit: Follow-up   [name] is a 43 year old female with severe persistent allergic asthma who presents for follow-up.  Since moving to [place] in 2002, she reports developing symptoms of shortness of breath and wheezing. She does not have a regular doctor and usually presents to urgent care/ED for management. Her primary symptom is shortness of breath which is associated with wheezing and worsening nonproductive cough. She has a dry cough at baseline. Symptoms are triggered by exertion, heat and smoke. Though she does actively smoke cigarettes and denies any respiratory issues with this. She previously smoked 1 ppd but cut down in 2018, only smoking when she is stressed. She is unable to perform regular activities such as vacuuming around the house. Steroids help but continues to have symptoms while on her medications. She has a rescue inhaler and home nebulizer at home that helps with symptoms but only lasts for 15 minutes. Her ED visits have been more frequent in the last year. She was previously admitted for one night for asthma exacerbation in December 2019 and since the start of 2020 reports 6-7 times  She was previously on [facility] for 3 months however stopped taking due to oral rashes/rashes last month. Has tried Breo (6 months), Qvar (2 years well-controlled, recently on for 2 months and stopped due to ineffectiveness) and Symbicort (1 month discontinued due to cost).   Interval History: She was started on    2019 Jan Feb March April May June July Aug Sept Oct Nov Dec   X X XX XX  XX X XX XXX  XX XXX  2020 Jan Feb March April May June July Aug Sept Oct Nov Dec   X X XXXX   X X  X      Social History: Social smoker 31 pack-years, started at age 63. Quit in 2018  Environmental exposures:  Worked in [employer] in 2010, left  job due to respiratory symptoms  I have personally reviewed patient's past medical/family/social history, allergies, current medications.  Past Medical History:  Diagnosis Date  . Asthma 2008  . COPD (chronic obstructive pulmonary disease) ([place])   . GERD (gastroesophageal reflux disease)   . Multiple gastric ulcers   . Pneumonia      Family History  Problem Relation Age of Onset  . Hypertension Father   . Diabetes Maternal Grandfather   . Heart attack Maternal Grandfather   . Stroke Maternal Grandfather   . Hypertension Maternal Grandfather      Social History   Occupational History  . Not on file  Tobacco Use  . Smoking status: Former Smoker    Packs/day: 1.00    Years: 31.00    Pack years: 31.00    Types: Cigarettes    Start date: 1989    Quit date: 02/24/2019    Years since quitting: 0.2  . Smokeless tobacco: Never Used  . Tobacco comment: are currently randomly smoking with stress 02/13/19  Substance and Sexual Activity  . Alcohol use: Not Currently    Comment: occasionally  . Drug use: No  . Sexual activity: Not Currently    Birth control/protection: None    No Known Allergies   Outpatient Medications Prior to Visit  Medication Sig Dispense Refill  . acetaminophen (TYLENOL) 500 MG tablet Take 500-1,000 mg by mouth every  6 (six) hours as needed.    Marland Kitchen albuterol (VENTOLIN HFA) 108 (90 Base) MCG/ACT inhaler Inhale 1-2 puffs into the lungs every 6 (six) hours as needed for wheezing or shortness of breath. 8 g 6  . budesonide-formoterol (SYMBICORT) 160-4.5 MCG/ACT inhaler Inhale 2 puffs into the lungs 2 (two) times daily.    . famotidine (PEPCID) 20 MG tablet Take 1 tablet (20 mg total) by mouth daily. (Patient taking differently: Take 20 mg by mouth daily as needed. ) 20 tablet 0  . ipratropium-albuterol (DUONEB) 0.5-2.5 (3) MG/3ML SOLN Take 3 mLs by nebulization every 6 (six) hours as needed. 360 mL 1  . loratadine (CLARITIN) 10 MG tablet Take 1 tablet (10 mg  total) by mouth daily as needed. 90 tablet 0  . pantoprazole (PROTONIX) 40 MG tablet Take 1 tablet (40 mg total) by mouth daily as needed. 30 tablet 3  . predniSONE (DELTASONE) 10 MG tablet 4 tabs for 2 days, then 3 tabs for 2 days, 2 tabs for 2 days, then 1 tab for 2 days, then stop 20 tablet 0  . promethazine-codeine (PHENERGAN WITH CODEINE) 6.25-10 MG/5ML syrup Take 5 mLs by mouth every 6 (six) hours as needed for cough. 120 mL 0   No facility-administered medications prior to visit.     Review of Systems  Constitutional: Negative for chills, diaphoresis, fever, malaise/fatigue and weight loss.  HENT: Negative for congestion, ear pain and sore throat.   Respiratory: Positive for cough, shortness of breath and wheezing. Negative for hemoptysis and sputum production.   Cardiovascular: Negative for chest pain, palpitations and leg swelling.  Gastrointestinal: Negative for abdominal pain, heartburn and nausea.  Genitourinary: Negative for frequency.  Musculoskeletal: Negative for joint pain and myalgias.  Skin: Negative for itching and rash.  Neurological: Negative for dizziness, weakness and headaches.  Endo/Heme/Allergies: Does not bruise/bleed easily.  Psychiatric/Behavioral: Negative for depression. The patient is not nervous/anxious.      Objective:   There were no vitals filed for this visit.    Physical Exam: General: BMI 56.37, well-appearing, no acute distress HENT: [place], AT Eyes: EOMI, no scleral icterus Respiratory: Expiratory wheezing, no rhonchi Cardiovascular: RRR, -M/R/G, no JVD GI: BS+, soft, nontender Extremities: Non-pitting edema,-tenderness Neuro: AAO x4, CNII-XII grossly intact Skin: Intact, no rashes or bruising Psych: Normal mood, normal affect  Data Reviewed:  Imaging: CXR 02/08/19 - Chronic bronchial thickening CT A/P 10/17/18 - RML scarring  PFT: None on record  Labs: SARS-Covid-2 01/17/2019- not detected 10/17/2018 WBC 11.2 with N 55% (6.2) L 32%  (3.6) M 7% (0.8) and E 5% (0.5) IgE 02/13/19 - 680    Imaging, labs and test noted above have been reviewed independently by me.    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 However has frequented [facility] ED nearly monthly for management of presumed asthma.   For your asthma/COPD: In the office:      --Intramuscular steroid injection given. (Methylprednisolone 120 mg)  Ordered home medications:       --START Advair HFA 2 puffs twice a day. Take EVERYDAY      --START Albuterol 2 puffs every 4 hours as needed. This is your RESCUE inhaler. You can also take before activity as needed      --START Duonebs nebulizer as needed. This is your RESCUE nebulizer.  Ordered labs: CBC with diff, IgE, pulmonary function tests  Ordered tests: Pulmonary  function test will be scheduled before your next visit with me  Follow up in 1 month  Health Maintenance Influenza 05/2018  No orders of the defined types were placed in this encounter.  No orders of the defined types were placed in this encounter.   No follow-ups on file.  [place], MD [facility] 05/23/2019 8:49 AM  Office Number [phone]

## 2019-06-01 ENCOUNTER — Telehealth: Payer: Self-pay

## 2019-06-01 MED ORDER — BUDESONIDE-FORMOTEROL FUMARATE 160-4.5 MCG/ACT IN AERO: 2.0000 | INHALATION_SPRAY | Freq: Two times a day (BID) | RESPIRATORY_TRACT | 3 refills | Status: DC

## 2019-06-01 NOTE — Telephone Encounter (Signed)
Received a fax from [facility] requesting a prescription for Symbicort to be faxed to [phone]. I printed Rx and had Aaron Edelman sign and faxed to the number provided. Nothing further is needed.

## 2019-06-02 ENCOUNTER — Telehealth: Payer: Self-pay | Admitting: Pulmonary Disease

## 2019-06-02 MED ORDER — BUDESONIDE-FORMOTEROL FUMARATE 160-4.5 MCG/ACT IN AERO: 2.0000 | INHALATION_SPRAY | Freq: Two times a day (BID) | RESPIRATORY_TRACT | 0 refills | Status: DC

## 2019-06-02 NOTE — Telephone Encounter (Signed)
Spoke with pt, advised her that I left a sample of Symbicort at the front for her. She states she is waiting on her shipment from the patient assistance program. Nothing further is needed.

## 2019-06-09 IMAGING — DX DG CHEST 2V
2 series · 2 of 2 positions shown · non-contrast
Comparison: 10/12/2017.

CLINICAL DATA: Wheezing after inhalational injury.  Asthma.

EXAM:
CHEST - 2 VIEW

[chest pa]
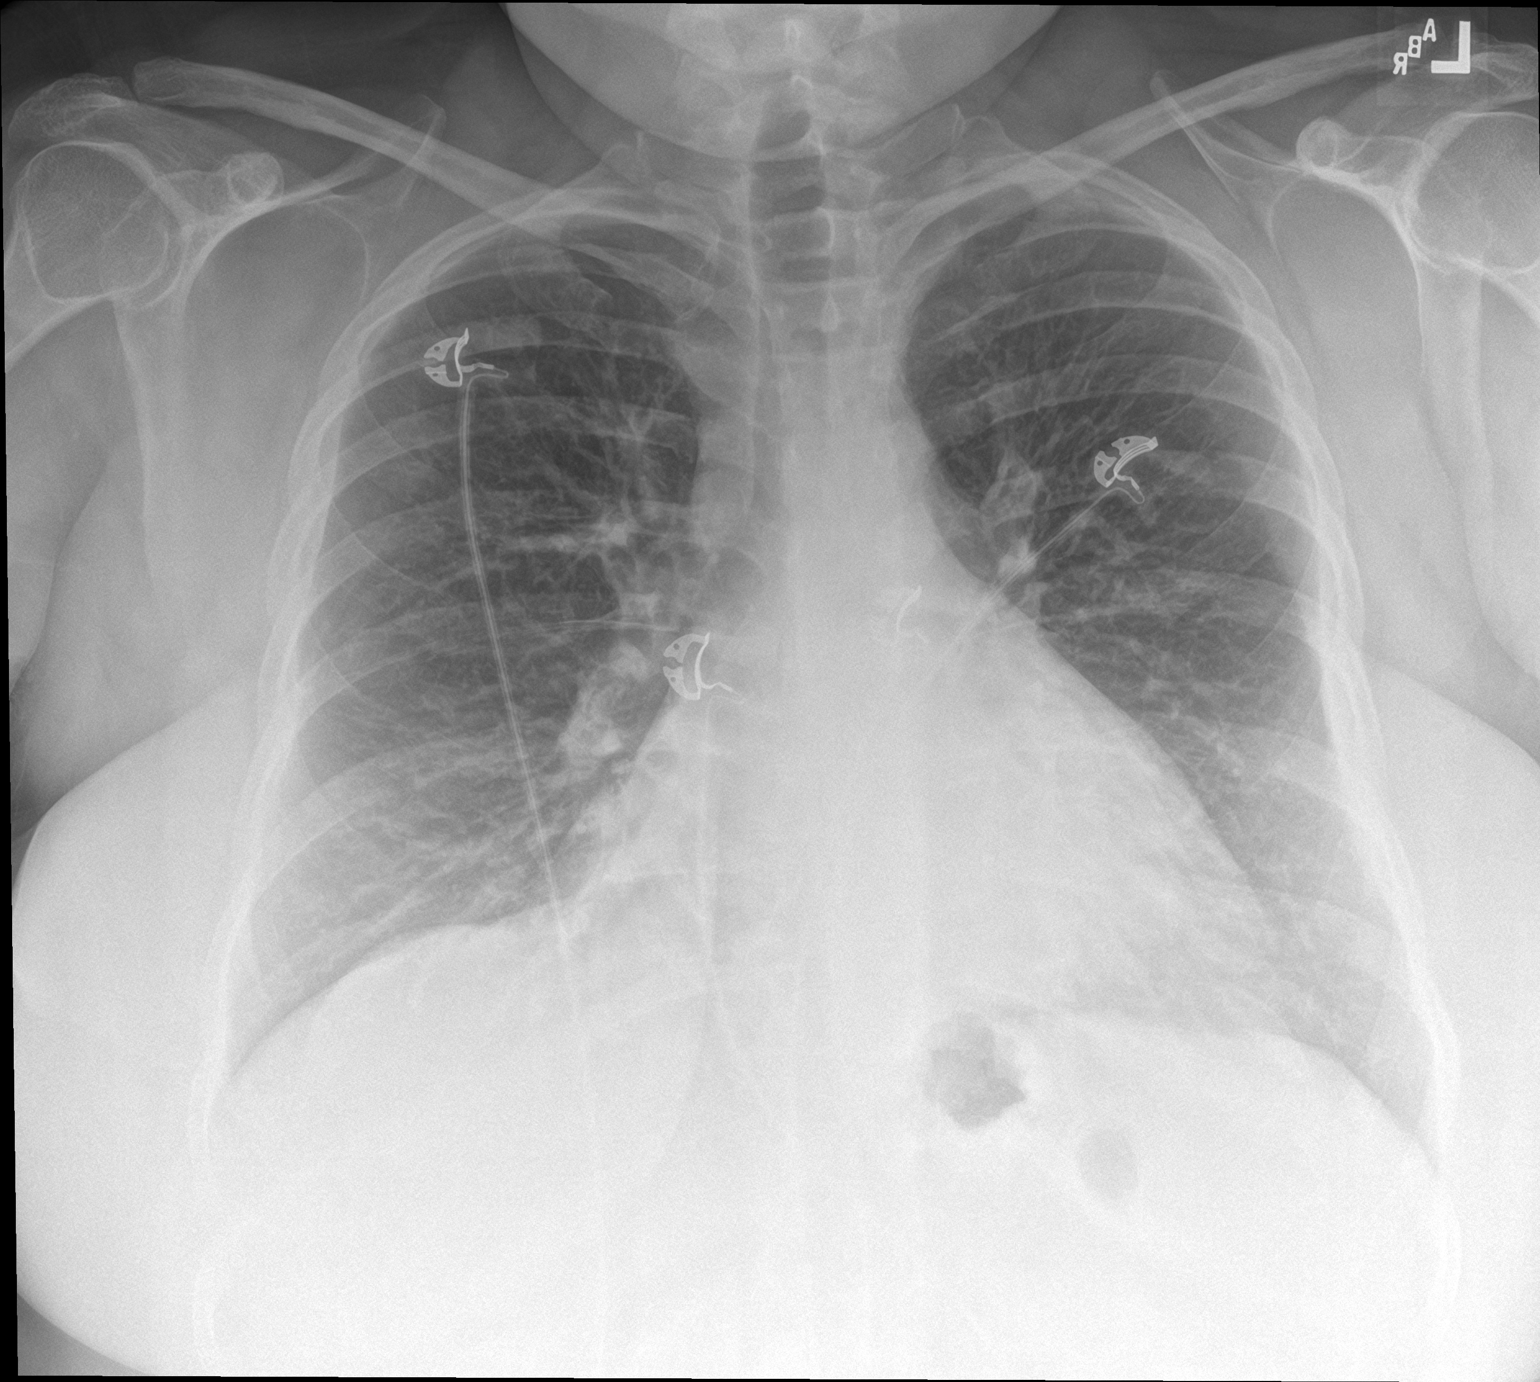

[chest lat]
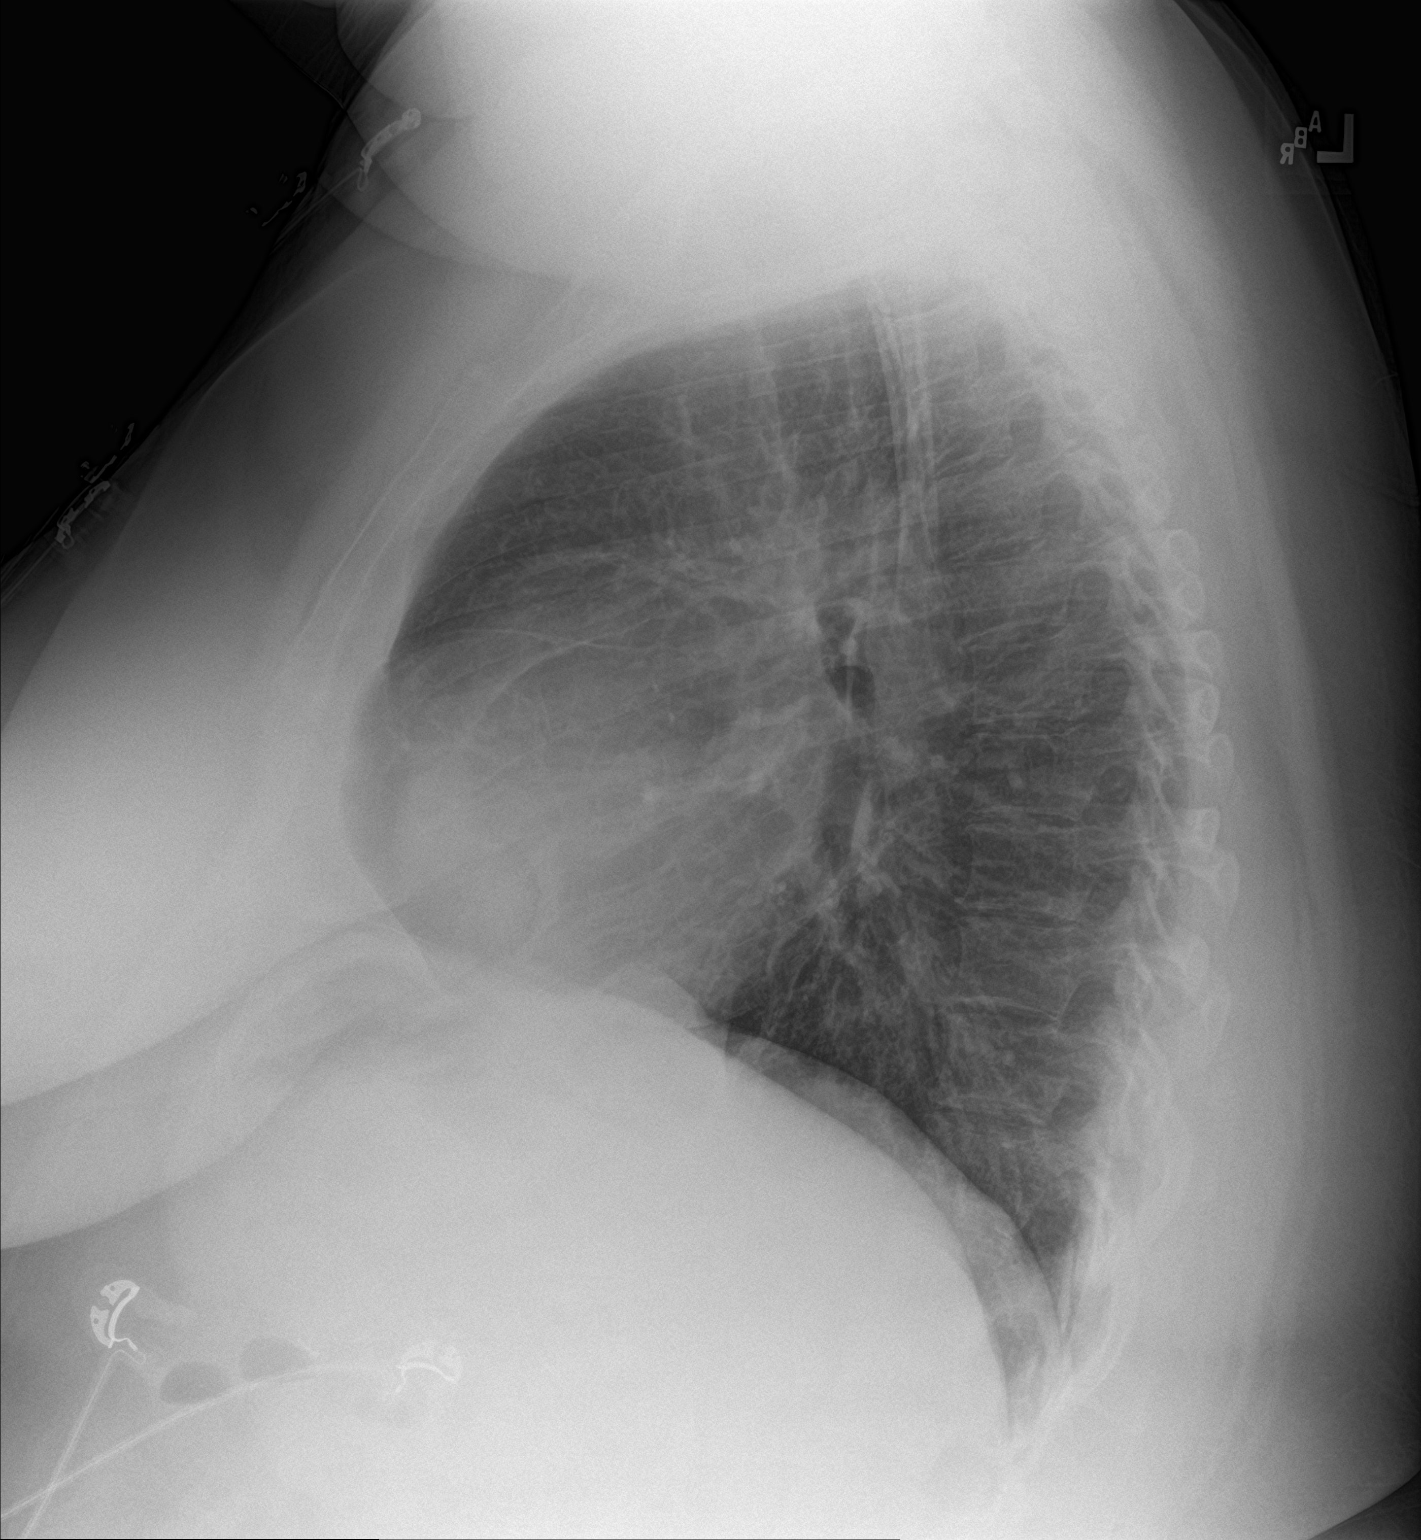

[2 of 2 positions shown; findings below may reference images not displayed]

FINDINGS: Trachea is midline. Heart size stable. Lungs are clear. No pleural
fluid.
IMPRESSION: No acute findings.

## 2019-07-03 ENCOUNTER — Encounter: Payer: Self-pay | Admitting: Pulmonary Disease

## 2019-07-03 ENCOUNTER — Telehealth: Payer: Self-pay | Admitting: Pulmonary Disease

## 2019-07-03 ENCOUNTER — Ambulatory Visit (INDEPENDENT_AMBULATORY_CARE_PROVIDER_SITE_OTHER): Payer: Medicaid Other | Admitting: Pulmonary Disease

## 2019-07-03 ENCOUNTER — Other Ambulatory Visit: Payer: Self-pay

## 2019-07-03 ENCOUNTER — Encounter: Payer: Self-pay | Admitting: *Deleted

## 2019-07-03 DIAGNOSIS — J4551 Severe persistent asthma with (acute) exacerbation: Secondary | ICD-10-CM

## 2019-07-03 DIAGNOSIS — Z7952 Long term (current) use of systemic steroids: Secondary | ICD-10-CM

## 2019-07-03 MED ORDER — HYDROCODONE-HOMATROPINE 5-1.5 MG/5ML PO SYRP: 5.0000 mL | ORAL_SOLUTION | Freq: Four times a day (QID) | ORAL | 0 refills | Status: DC | PRN

## 2019-07-03 MED ORDER — PREDNISONE 10 MG PO TABS: ORAL_TABLET | ORAL | 0 refills | Status: DC

## 2019-07-03 MED ORDER — LEVOFLOXACIN 500 MG PO TABS: 500.0000 mg | ORAL_TABLET | Freq: Every day | ORAL | 0 refills | Status: DC

## 2019-07-03 NOTE — Progress Notes (Signed)
Virtual Visit via Telephone Note  I connected with Linda Ellis on 07/03/19 at  1:30 PM EST by telephone and verified that I am speaking with the correct person using two identifiers.  Location: Patient: Home Provider: [facility]   I discussed the limitations, risks, security and privacy concerns of performing an evaluation and management service by telephone and the availability of in person appointments. I also discussed with the patient that there may be a patient responsible charge related to this service. The patient expressed understanding and agreed to proceed.   History of Present Illness: Linda Ellis is a 43 year old female former smoker with no history of childhood asthma who presents on televisit for severe persistent asthma with steroid dependence.  She was last seen in clinic on 02/13/19. Her visit noted frequent asthma exacerbations requiring steroid treatment nearly every month of 2019 and 2020 despite being compliant on steroid inhalers. After starting Symbicort and taking consistently she feels her daily symptoms of cough, wheezing and shortness of breath have improved but will be triggered with illness and allergens.  She has Symbicort 160-4.5 mcg two puffs twice a day. Since starting Symbicort she rarely uses unless her rescue inhalers or nebulizers unless she is sick.  Three days ago, she reports starting sneezing which then progressed to a productive cough and wheezing associated fever up to 101 x 2 days with improvement after Tylenol. Symptoms occur during the day and night. Rescue inhaler improves symptoms but does not last long.  Steroids Received in 2019 and 2020 2019 Jan Feb March April May June July Aug Sept Oct Nov Dec   X X XX XX  XX X XX XXX  XX XXX  2020 Jan Feb March April May June July Aug Sept Oct Nov Dec   X X XXXX   X XXX  X   X   Observations/Objective: Not in respiratory distress however coarse breath sounds over the phone with  intermittent rhonchus cough and expiratory wheezing. Congested and hoarse sounding Able to speak full sentences  Labs: CBC    Component Value Date/Time   WBC 15.3 (H) 02/13/2019 1036   RBC 4.38 02/13/2019 1036   HGB 13.6 02/13/2019 1036   HCT 40.4 02/13/2019 1036   PLT 419.0 (H) 02/13/2019 1036   MCV 92.2 02/13/2019 1036   MCH 29.7 10/17/2018 1234   MCHC 33.5 02/13/2019 1036   RDW 14.0 02/13/2019 1036   LYMPHSABS 5.9 (H) 02/13/2019 1036   MONOABS 0.9 02/13/2019 1036   EOSABS 0.1 02/13/2019 1036   BASOSABS 0.0 02/13/2019 1036   Absolute eosinophils 10/17/18 - 500 IgE 02/13/19 - 680  Assessment and Plan:  Asthma exacerbation Severe persistent allergic asthma with steroid dependence Clinical diagnosis of asthma that is likely allergic phenotype with elevated IgE and eosinophilia. She is steroid dependent and would benefit from biologic add-on. Discussed biologic agents including Xolair (anti-IgE), Nucala (anti-IL-5), Fasenra (anti-IL-5 receptor alpha) and Dupixent (anti-IL-4 receptor subunit alpha). I believe patient would benefit from biologic agent given uncontrolled symptoms, multiple exacerbations and eosinophilia. We discussed the risks and benefits of these type of medications including anaphylaxis. Patient expressed understanding and would like to pursue treatment if eligible.  START prednisone taper START levaquin 500 mg x 5 days START hycodan for cough syrup START enrollment for Dupixent Will provide work note  Follow Up Instructions: Follow-up in 1-2 weeks   I discussed the assessment and treatment plan with the patient. The patient was provided an opportunity to ask  questions and all were answered. The patient agreed with the plan and demonstrated an understanding of the instructions.   The patient was advised to call back or seek an in-person evaluation if the symptoms worsen or if the condition fails to improve as anticipated.  I provided 25 minutes of  non-face-to-face time during this encounter.   Zalma Channing Rodman Pickle, MD

## 2019-07-03 NOTE — Telephone Encounter (Signed)
Spoke with patient. She stated that she started sneezing a lot on Friday 11/27. Then on Saturday, she developed a cough with tan-greenish phlegm. On Sunday, she developed a fever. She took Tylenol and has not noticed the fever since then. She is still coughing and sneezing. She denied being around anyone with COVID that she knows of. She has been using her DuoNeb solution has prescribed with no relief.   Patient has been scheduled for a televisit today at 130 with Dr. Loanne Drilling.   Nothing further needed at time of call.

## 2019-07-04 NOTE — Patient Instructions (Addendum)
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

## 2019-07-05 ENCOUNTER — Other Ambulatory Visit: Payer: Self-pay

## 2019-07-05 DIAGNOSIS — Z20822 Contact with and (suspected) exposure to covid-19: Secondary | ICD-10-CM

## 2019-07-06 ENCOUNTER — Telehealth: Payer: Self-pay | Admitting: Pulmonary Disease

## 2019-07-06 NOTE — Progress Notes (Signed)
Work note was created after pt's visit and sent to her mychart account. Nothing further needed.

## 2019-07-06 NOTE — Telephone Encounter (Signed)
This should not cause insomnia but can cause drowsiness. From reported patient encounters, this medication usually assists with sleep due to the ingredients in the cough syrup.  Please order tessalon perles if patient would still like an alternative medication. Dosage 100 mg three times daily as needed x 10 days.  Rodman Pickle, M.D. [facility] 07/06/2019 4:58 PM

## 2019-07-06 NOTE — Telephone Encounter (Signed)
Spoke with patient. Was able to reschedule her MyChart visit. She also had a question if the cough syrup Dr. Loanne Drilling gave her had a side effect of insomnia. She states she has been taking it and has only gotten 6 hours of sleep. She feels drowsy but can't fall asleep and now is exhausted.  Dr. Loanne Drilling please advise

## 2019-07-07 LAB — NOVEL CORONAVIRUS, NAA: SARS-CoV-2, NAA: NOT DETECTED

## 2019-07-07 MED ORDER — BENZONATATE 100 MG PO CAPS: 100.0000 mg | ORAL_CAPSULE | Freq: Three times a day (TID) | ORAL | 0 refills | Status: DC | PRN

## 2019-07-07 NOTE — Telephone Encounter (Signed)
ATC patient unable to reach LM to call back office (x1)  

## 2019-07-07 NOTE — Telephone Encounter (Signed)
Called and spoke with pt letting her know the info stated by JE. Stated to pt that that Linda Ellis also said we could send Rx to pharmacy for her of tessalon to use as an alternative med and pt said she would like that. Verified pt's preferred pharmacy and sent Rx in. Nothing further needed.

## 2019-07-07 NOTE — Telephone Encounter (Signed)
Patient is returning phone call. Patient phone number is [phone]. 

## 2019-07-10 ENCOUNTER — Telehealth: Payer: Self-pay | Admitting: Pulmonary Disease

## 2019-07-10 ENCOUNTER — Telehealth (INDEPENDENT_AMBULATORY_CARE_PROVIDER_SITE_OTHER): Payer: Medicaid Other | Admitting: Pulmonary Disease

## 2019-07-10 DIAGNOSIS — J4551 Severe persistent asthma with (acute) exacerbation: Secondary | ICD-10-CM

## 2019-07-10 DIAGNOSIS — Z7952 Long term (current) use of systemic steroids: Secondary | ICD-10-CM

## 2019-07-10 MED ORDER — PREDNISONE 10 MG PO TABS: ORAL_TABLET | ORAL | 0 refills | Status: DC

## 2019-07-10 NOTE — Patient Instructions (Signed)
Severe persistent allergic asthma with steroid dependence Will start increased and prolonged prednisone taper. Take as prescribed Continue hycodan for cough syrup Continue Symbicort 160-4.5 mcg TWO puffs TWICE daily Arranging enrollment for Dupixent Will send work note. Ok to return to work by 07/17/19 if symptoms are improved

## 2019-07-10 NOTE — Telephone Encounter (Signed)
There is no previous notes on Dupixent enrollment,  enrollment form in biologics pool, or biologics book. Patient does not currently have [place] here or any ordered. Has enrollment been started?

## 2019-07-10 NOTE — Telephone Encounter (Signed)
Patient is supposed to be on Dupixent, checking on status of medication

## 2019-07-10 NOTE — Progress Notes (Signed)
Virtual Visit via Video Note  I connected with Linda Ellis on 07/10/19 at 11:00 AM EST by a video enabled telemedicine application and verified that I am speaking with the correct person using two identifiers.  Location: Patient: Home Provider: [facility]   I discussed the limitations of evaluation and management by telemedicine and the availability of in person appointments. The patient expressed understanding and agreed to proceed.   I discussed the assessment and treatment plan with the patient. The patient was provided an opportunity to ask questions and all were answered. The patient agreed with the plan and demonstrated an understanding of the instructions.   The patient was advised to call back or seek an in-person evaluation if the symptoms worsen or if the condition fails to improve as anticipated.  I provided 26 minutes of non-face-to-face time during this encounter.   Jream Broyles Rodman Pickle, MD    Subjective:   PATIENT ID: Linda Ellis GENDER: female DOB: 1976-02-04, MRN: [id number]   HPI  Chief Complaint  Patient presents with  . Follow-up    Reason for Visit: New consult for asthma/COPD  [name] is a 43 year old female former smoker with no history of childhood asthma who presents on televisit for severe persistent asthma with steroid dependence.  She has had frequent asthma exacerbations requiring steroid treatment nearly every month of 2019 and 2020 despite being compliant on steroid inhalers.   She was improving with steroid taper however began having thick productive cough with brown sputum. Denies hemoptysis. Also having wheezing and shortness of breath. Associated fatigue. Denies subjective fevers. She is still compliant with her Symbicort. Has had to increase her albuterol nebulizer 6 times which helps but she has recurrent symptoms after one to two hours. She has to minimize activity to not exacerbate her symptoms. When she is on prednisone,  she does have have some mild bruising which is typical for her.  Steroids Received in 2019 and 2020 2019 Jan Feb March April May June July Aug Sept Oct Nov Dec   X X XX XX  XX X XX XXX  XX XXX  2020 Jan Feb March April May June July Aug Sept Oct Nov Dec   X X XXXX   X XXX  X   XX    Social History: Social smoker 31 pack-years, started at age 22. Quit in 2018  Environmental exposures:  Worked in [employer] in 2010, left job due to respiratory symptoms  I have personally reviewed patient's past medical/family/social history/allergies/current medications.  Past Medical History:  Diagnosis Date  . Asthma 2008  . COPD (chronic obstructive pulmonary disease) ([place])   . GERD (gastroesophageal reflux disease)   . Multiple gastric ulcers   . Pneumonia      Family History  Problem Relation Age of Onset  . Hypertension Father   . Diabetes Maternal Grandfather   . Heart attack Maternal Grandfather   . Stroke Maternal Grandfather   . Hypertension Maternal Grandfather      Social History   Occupational History  . Not on file  Tobacco Use  . Smoking status: Former Smoker    Packs/day: 1.00    Years: 31.00    Pack years: 31.00    Types: Cigarettes    Start date: 1989    Quit date: 02/24/2019    Years since quitting: 0.3  . Smokeless tobacco: Never Used  . Tobacco comment: are currently randomly smoking with stress 02/13/19  Substance and  Sexual Activity  . Alcohol use: Not Currently    Comment: occasionally  . Drug use: No  . Sexual activity: Not Currently    Birth control/protection: None    No Known Allergies   Outpatient Medications Prior to Visit  Medication Sig Dispense Refill  . acetaminophen (TYLENOL) 500 MG tablet Take 500-1,000 mg by mouth every 6 (six) hours as needed.    Marland Kitchen albuterol (VENTOLIN HFA) 108 (90 Base) MCG/ACT inhaler Inhale 1-2 puffs into the lungs every 6 (six) hours as needed for wheezing or shortness of breath. 8 g 6  . benzonatate  (TESSALON) 100 MG capsule Take 1 capsule (100 mg total) by mouth 3 (three) times daily as needed for cough. 30 capsule 0  . budesonide-formoterol (SYMBICORT) 160-4.5 MCG/ACT inhaler Inhale 2 puffs into the lungs 2 (two) times daily. 1 Inhaler 0  . HYDROcodone-homatropine (HYCODAN) 5-1.5 MG/5ML syrup Take 5 mLs by mouth every 6 (six) hours as needed for cough. 240 mL 0  . ipratropium-albuterol (DUONEB) 0.5-2.5 (3) MG/3ML SOLN Take 3 mLs by nebulization every 6 (six) hours as needed. 360 mL 1  . levofloxacin (LEVAQUIN) 500 MG tablet Take 1 tablet (500 mg total) by mouth daily. 5 tablet 0  . loratadine (CLARITIN) 10 MG tablet Take 1 tablet (10 mg total) by mouth daily as needed. (Patient not taking: Reported on 07/03/2019) 90 tablet 0  . pantoprazole (PROTONIX) 40 MG tablet Take 1 tablet (40 mg total) by mouth daily as needed. 30 tablet 3  . predniSONE (DELTASONE) 10 MG tablet Take 4x3days, 3x3days, 2x3days, then stop 27 tablet 0  . Pseudoeph-Doxylamine-DM-APAP (NYQUIL PO) Take 2 capsules by mouth daily as needed.     No facility-administered medications prior to visit.     Review of Systems  Constitutional: Negative for chills, diaphoresis, fever, malaise/fatigue and weight loss.  HENT: Negative for congestion, ear pain and sore throat.   Respiratory: Positive for cough, shortness of breath and wheezing. Negative for hemoptysis and sputum production.   Cardiovascular: Negative for chest pain, palpitations and leg swelling.  Gastrointestinal: Negative for abdominal pain, heartburn and nausea.  Genitourinary: Negative for frequency.  Musculoskeletal: Negative for joint pain and myalgias.  Skin: Negative for itching and rash.  Neurological: Negative for dizziness, weakness and headaches.  Endo/Heme/Allergies: Does not bruise/bleed easily.  Psychiatric/Behavioral: Negative for depression. The patient is not nervous/anxious.      Objective:   There were no vitals filed for this visit.     Physical Exam: General: Well-appearing, no acute distress HENT: [place], AT Eyes: EOMI, no scleral icterus Respiratory: No tachypnea or respiratory distress. Coarse cough Neuro: AAO x4, CNII-XII grossly intact Psych: Normal mood, normal affect  Data Reviewed:  Imaging: CXR 02/08/19 - Chronic bronchial thickening CT A/P 10/17/18 - RML scarring  PFT: None on record  Labs: SARS-Covid-2 01/17/2019- not detected 10/17/2018 WBC 11.2 with N 55% (6.2) L 32% (3.6) M 7% (0.8) and E 5% (0.5)  Absolute eosinophils 10/17/18 - 500 IgE 02/13/19 - 680  Imaging, labs and test noted above have been reviewed independently by me.    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 No longer smoking since 2018 but did relapse last month after her father passed.   Severe persistent allergic asthma with steroid dependence Biologic enrollment pending. If she remains symptomatic on prednisone taper, will plan to  increase ICS/LABA inhaler to Advair 250 or 500  Will start increased and prolonged prednisone taper Continue hycodan for cough syrup Continue Symbicort 160-4.5 mcg TWO puffs TWICE daily Arranging enrollment for Dupixent Will send work note. Ok to return to work by 07/17/19 if symptoms are improved  Health Maintenance Immunization History  Administered Date(s) Administered  . Influenza,inj,Quad PF,6+ Mos 05/04/2018   No orders of the defined types were placed in this encounter.  Meds ordered this encounter  Medications  . predniSONE (DELTASONE) 10 MG tablet    Sig: Take 6 tablets x three days (60 mg), followed by 5 tablets x three days (50mg ), then 4 tablets x three day(40mg ), then 3 tablets (30mg ) x three days, then 2 tablets (20mg ) x three days, then 1 tablet (10mg ) x three days, then STOP    Dispense:  60  tablet    Refill:  0    Return in about 2 months (around 09/10/2019).  Bell Arthur, MD [facility] 07/10/2019 8:59 AM  Office Number [phone]

## 2019-07-11 ENCOUNTER — Encounter: Payer: Self-pay | Admitting: *Deleted

## 2019-07-11 ENCOUNTER — Telehealth: Payer: Medicaid Other | Admitting: Pulmonary Disease

## 2019-07-12 NOTE — Telephone Encounter (Signed)
Patient will be here Thursday 07/13/19 to sign paperwork.

## 2019-07-17 NOTE — Telephone Encounter (Signed)
Pt did not come by the office on 07/13/2019 to sign forms.  LMTCB x1 for pt.

## 2019-07-20 NOTE — Telephone Encounter (Signed)
LMTCB x2 for pt 

## 2019-07-25 NOTE — Telephone Encounter (Signed)
LMTCB x3 for pt. We have attempted to contact pt several times with no success or call back from pt. Per office protocol, message will be closed.   

## 2019-08-01 ENCOUNTER — Telehealth: Payer: Self-pay | Admitting: Pulmonary Disease

## 2019-08-01 NOTE — Telephone Encounter (Signed)
Enrollment forms for Dupixent were brought to me by Malena Peer. Pt dropped them off at the front door. Forms and insurance cards have been faxed to [facility] [facility]. Will await summary of benefits.

## 2019-08-02 NOTE — Telephone Encounter (Signed)
Received fax today from [facility] [place] [facility] to confirm enrollment received.

## 2019-08-09 ENCOUNTER — Other Ambulatory Visit: Payer: Self-pay | Admitting: Physician Assistant

## 2019-08-09 NOTE — Telephone Encounter (Signed)
Received fax confirmation that the fax was successfully sent at 1149.

## 2019-08-09 NOTE — Telephone Encounter (Signed)
Received a fax today from [facility] [facility]. Due to the pt having Medicaid, [employer] [facility] could not do a benefits verification. PA form for Dupixent has been sent to [place]. Will await PA decision.

## 2019-08-16 NOTE — Telephone Encounter (Signed)
Requested OV notes and labs from last 12 months  faxed to [place], as requested at [phone]. Will await PA decision.

## 2019-08-16 NOTE — Telephone Encounter (Signed)
[employer] spec Rudi Rummage is calling needs Clinic notes and labs and the exaherbations in last 12 months  [phone]  FaX # [phone]

## 2019-08-18 NOTE — Telephone Encounter (Signed)
Received message fax was not received by  [facility].  Confirmed fax number.  OV notes, ED notes, and labs faxed to [phone]. Will await decision.

## 2019-08-21 NOTE — Telephone Encounter (Signed)
Fax received from [employer] requesting additional labs, OV notes, and Environmental consultant. Form completed and requested info faxed to [phone].

## 2019-08-23 ENCOUNTER — Other Ambulatory Visit (HOSPITAL_COMMUNITY)
Admission: RE | Admit: 2019-08-23 | Discharge: 2019-08-23 | Disposition: A | Discharge status: Home / Self Care | Payer: Medicaid Other | Source: Ambulatory Visit | Attending: Physician Assistant | Admitting: Physician Assistant

## 2019-08-23 ENCOUNTER — Encounter: Payer: Self-pay | Admitting: Physician Assistant

## 2019-08-23 ENCOUNTER — Other Ambulatory Visit: Payer: Self-pay

## 2019-08-23 ENCOUNTER — Ambulatory Visit: Payer: Medicaid Other | Admitting: Physician Assistant

## 2019-08-23 DIAGNOSIS — Z131 Encounter for screening for diabetes mellitus: Secondary | ICD-10-CM

## 2019-08-23 DIAGNOSIS — Z13220 Encounter for screening for lipoid disorders: Secondary | ICD-10-CM | POA: Insufficient documentation

## 2019-08-23 DIAGNOSIS — R109 Unspecified abdominal pain: Secondary | ICD-10-CM | POA: Insufficient documentation

## 2019-08-23 DIAGNOSIS — E669 Obesity, unspecified: Secondary | ICD-10-CM

## 2019-08-23 DIAGNOSIS — J45909 Unspecified asthma, uncomplicated: Secondary | ICD-10-CM

## 2019-08-23 LAB — COMPREHENSIVE METABOLIC PANEL
ALT: 28 U/L (ref 0–44)
AST: 20 U/L (ref 15–41)
Albumin: 3.6 g/dL (ref 3.5–5.0)
Alkaline Phosphatase: 83 U/L (ref 38–126)
Anion gap: 11 (ref 5–15)
BUN: 8 mg/dL (ref 6–20)
CO2: 26 mmol/L (ref 22–32)
Calcium: 9 mg/dL (ref 8.9–10.3)
Chloride: 102 mmol/L (ref 98–111)
Creatinine, Ser: 0.73 mg/dL (ref 0.44–1.00)
GFR calc Af Amer: >60 mL/min (ref >60)
GFR calc non Af Amer: >60 mL/min (ref >60)
Glucose, Bld: 93 mg/dL (ref 70–99)
Potassium: 3.8 mmol/L (ref 3.5–5.1)
Sodium: 139 mmol/L (ref 135–145)
Total Bilirubin: 0.4 mg/dL (ref 0.3–1.2)
Total Protein: 6.9 g/dL (ref 6.5–8.1)

## 2019-08-23 LAB — URINALYSIS, ROUTINE W REFLEX MICROSCOPIC
Bacteria, UA: NONE SEEN
Bilirubin Urine: NEGATIVE
Glucose, UA: NEGATIVE mg/dL
Ketones, ur: NEGATIVE mg/dL
Nitrite: NEGATIVE
Protein, ur: NEGATIVE mg/dL
Specific Gravity, Urine: 1.019 (ref 1.005–1.030)
pH: 5 (ref 5.0–8.0)

## 2019-08-23 LAB — HEMOGLOBIN A1C
Hgb A1c MFr Bld: 5.8 % — ABNORMAL HIGH (ref 4.8–5.6)
Mean Plasma Glucose: 119.76 mg/dL

## 2019-08-23 MED ORDER — CEPHALEXIN 500 MG PO CAPS: 500.0000 mg | ORAL_CAPSULE | Freq: Four times a day (QID) | ORAL | 0 refills | Status: AC

## 2019-08-23 NOTE — Telephone Encounter (Signed)
Spoke with Threasa Beards, Dupixent Rep. Patient information is currently being processed and reviewed.

## 2019-08-23 NOTE — Telephone Encounter (Signed)
Received fax from [facility] requesting OV notes and labs again.  All requested information faxed to requested number.

## 2019-08-23 NOTE — Progress Notes (Signed)
There were no vitals taken for this visit.   Subjective:    Patient ID: Linda Ellis, female    DOB: 1976/07/11, 44 y.o.   MRN: [id number]  HPI: Linda Ellis is a 44 y.o. female presenting on 08/23/2019 for No chief complaint on file.   HPI   This is a telemedicine appointment through [employer] due to coronavirus pandemic.  I connected with  Linda Ellis on 08/23/19 by a video enabled telemedicine application and verified that I am speaking with the correct person using two identifiers.   I discussed the limitations of evaluation and management by telemedicine. The patient expressed understanding and agreed to proceed.  Pt is at home.  Provider is working from home office.    Pt reports that she Started cramping on saturday while on menses.  Menses finished and she is still having cramps and menses came back.     She still has cramps and menses finished Monday night.   She Feels like L kidney pain/pain in back.    Pt is sexually active and doesn't use any birth control.  She says her fiance told her he got fixed.    Pt says she had her R tube removed several years ago due to an ectopic pregnancy.      Last month her period was on time and normal.  She usually gets cramps on second day of here cycle but this time was different.    No burning or pain with urination and no frequency but  She says her urine is really dark .  She hasn't noticed any odor.  She says she's drinking lots of fluids so doesn't understand why it's so dark.    Pt cancelled her appointment in october and she never got labs drawn ordered in august.    She says she forgot.  Pt sees pulmonology for her asthma.      Relevant past medical, surgical, family and social history reviewed and updated as indicated. Interim medical history since our last visit reviewed. Allergies and medications reviewed and updated.   Current Outpatient Medications:  .  acetaminophen (TYLENOL) 500 MG tablet, Take 500-1,000 mg by mouth  every 6 (six) hours as needed., Disp: , Rfl:  .  albuterol (VENTOLIN HFA) 108 (90 Base) MCG/ACT inhaler, Inhale 1-2 puffs into the lungs every 6 (six) hours as needed for wheezing or shortness of breath., Disp: 8 g, Rfl: 6 .  budesonide-formoterol (SYMBICORT) 160-4.5 MCG/ACT inhaler, Inhale 2 puffs into the lungs 2 (two) times daily., Disp: 1 Inhaler, Rfl: 0 .  ipratropium-albuterol (DUONEB) 0.5-2.5 (3) MG/3ML SOLN, Take 3 mLs by nebulization every 6 (six) hours as needed., Disp: 360 mL, Rfl: 1 .  loratadine (CLARITIN) 10 MG tablet, Take 1 tablet (10 mg total) by mouth daily as needed. (Patient not taking: Reported on 07/03/2019), Disp: 90 tablet, Rfl: 0 .  pantoprazole (PROTONIX) 40 MG tablet, Take 1 tablet (40 mg total) by mouth daily as needed. (Patient not taking: Reported on 08/23/2019), Disp: 30 tablet, Rfl: 3    Review of Systems  Per HPI unless specifically indicated above     Objective:    There were no vitals taken for this visit.  Wt Readings from Last 3 Encounters:  03/02/19 (!) 328 lb (148.8 kg)  02/13/19 (!) 328 lb 6.4 oz (149 kg)  02/08/19 (!) 303 lb 15.9 oz (137.9 kg)    Physical Exam Constitutional:      General: She is not in  acute distress.    Appearance: Normal appearance. She is obese. She is not ill-appearing.  HENT:     Head: Normocephalic and atraumatic.  Pulmonary:     Effort: Pulmonary effort is normal. No respiratory distress.  Neurological:     Mental Status: She is alert and oriented to person, place, and time.  Psychiatric:        Attention and Perception: Attention normal.        Speech: Speech normal.        Behavior: Behavior is cooperative.          Assessment & Plan:    Encounter Diagnoses  Name Primary?  . Flank pain Yes  . Screening for diabetes mellitus   . Screening cholesterol level   . Obesity, unspecified classification, unspecified obesity type, unspecified whether serious comorbidity present   . Asthma, unspecified asthma  severity, unspecified whether complicated, unspecified whether persistent      -Discussed with pt likely cystitis.  Will check UA and start keflex.  Pt to notify office for new symptoms or if fails to resolve -pt to get bloodwork/labs today when she gives urine sample.  She will be called with results -pt to continue with pulmonologist per their recomendations -encouraged pt to Go to gyn to update pap (she has FP medicaid) -pt to follow up in 3 months. She is to contact office sooner prn

## 2019-08-28 NOTE — Telephone Encounter (Signed)
We still have not recived a detmerination on this pt's PA. Environmental education officer at [phone]. Spoke with Wells Guiles, she states that they did not receive any of the documents that were faxed. All information has been refaxed to [employer] at [phone]. Will await PA decision.

## 2019-08-31 NOTE — Telephone Encounter (Signed)
Received a fax that [employer] is still needing clinical documentation for this PA. These documents have been faxed on several occassions. They have been faxed once again today at [phone].

## 2019-09-04 ENCOUNTER — Encounter: Payer: Self-pay | Admitting: Adult Health

## 2019-09-04 ENCOUNTER — Telehealth (INDEPENDENT_AMBULATORY_CARE_PROVIDER_SITE_OTHER): Payer: Medicaid Other | Admitting: Adult Health

## 2019-09-04 ENCOUNTER — Telehealth: Payer: Self-pay | Admitting: Pulmonary Disease

## 2019-09-04 DIAGNOSIS — J4551 Severe persistent asthma with (acute) exacerbation: Secondary | ICD-10-CM

## 2019-09-04 DIAGNOSIS — Z7952 Long term (current) use of systemic steroids: Secondary | ICD-10-CM

## 2019-09-04 MED ORDER — PREDNISONE 10 MG PO TABS: ORAL_TABLET | ORAL | 0 refills | Status: DC

## 2019-09-04 NOTE — Telephone Encounter (Signed)
Patient called states she is starting to feel worse and wants prednisone called in for her. Offered her a mychart visit since hasn't been seen recently.   Mychart made with TP at 1130 nothing further needed at this time

## 2019-09-04 NOTE — Patient Instructions (Addendum)
Mucinex DM twice daily as needed for cough and congestion Prednisone taper over the next week Claritin 10 mg daily as needed for drainage Fluids and rest Continue on Symbicort 2 puffs twice daily. Rinse after use .  Albuterol inhaler or Duoneb As needed  For wheezing .  Will check Dupixent process.  Follow up with Dr. Loanne Drilling in 4-6 weeks and As needed   Please contact office for sooner follow up if symptoms do not improve or worsen or seek emergency care

## 2019-09-04 NOTE — Progress Notes (Signed)
Virtual Visit via Video Note  I connected with Linda Ellis on 09/04/19 at 11:30 AM EST by a video enabled telemedicine application and verified that I am speaking with the correct person using two identifiers.  Location: Patient: Home Provider: Office    I discussed the limitations of evaluation and management by telemedicine and the availability of in person appointments. The patient expressed understanding and agreed to proceed.  History of Present Illness: 44 year old female former smoker quit in 2020 followed for severe persistent asthma  Today's televisit is an acute office visit.  Patient complains over the last 2 to 3 days that she has had increased wheezing and dry cough.  Feels that her asthma is getting ready to flare.  She want to call and says she did get something before her asthma got out of control.  She remains on her Symbicort twice daily.  Says that she is taking this without any missed doses.  Says that she is had to use her albuterol more often than usual.  She denies any fever, loss of taste or smell, known sick contacts, chest pain, orthopnea or edema.  No discolored mucus. Patient does not have any insurance.  She gets Symbicort through patient assistance program.  Patient has been set up for [employer] but this has not been approved as of yet.   Patient is prone to frequent flares requiring steroids.  Over the last 9 years weight is up over 120 pounds.  She is currently at 328 pounds  Patient Active Problem List   Diagnosis Date Noted  . Severe persistent asthma dependent on systemic steroids with acute exacerbation 07/30/2018  . Gastroesophageal reflux disease   . Morbid obesity with BMI of 50.0-59.9, adult ([place]) 05/03/2018    Current Outpatient Medications on File Prior to Visit  Medication Sig Dispense Refill  . acetaminophen (TYLENOL) 500 MG tablet Take 500-1,000 mg by mouth every 6 (six) hours as needed.    Marland Kitchen albuterol (VENTOLIN HFA) 108 (90 Base) MCG/ACT  inhaler Inhale 1-2 puffs into the lungs every 6 (six) hours as needed for wheezing or shortness of breath. 8 g 6  . budesonide-formoterol (SYMBICORT) 160-4.5 MCG/ACT inhaler Inhale 2 puffs into the lungs 2 (two) times daily. 1 Inhaler 0  . ipratropium-albuterol (DUONEB) 0.5-2.5 (3) MG/3ML SOLN Take 3 mLs by nebulization every 6 (six) hours as needed. 360 mL 1  . loratadine (CLARITIN) 10 MG tablet Take 1 tablet (10 mg total) by mouth daily as needed. 90 tablet 0  . pantoprazole (PROTONIX) 40 MG tablet Take 1 tablet (40 mg total) by mouth daily as needed. (Patient not taking: Reported on 09/04/2019) 30 tablet 3  . [DISCONTINUED] ALBUTEROL IN Inhale 2 puffs into the lungs as needed. For shortness of breath      No current facility-administered medications on file prior to visit.      Observations/Objective: Appears in no acute distress.  Speaks in full sentences.  No apparent increased work of breathing on video  Assessment and Plan: Severe persistent asthma-recurrent exacerbations Patient is receiving frequent steroid exacerbations.  She is currently controlled on Symbicort and gets this through patient assistance.  May need to expand her to triple coverage we need to try to get [facility]  or Trelegy through patient assistance..  Also needs PFTs on return.-none on record  Patient education on steroids.  Dupixent has been ordered and approval is pending  Plan  Patient Instructions  Mucinex DM twice daily as needed for cough and congestion Prednisone  taper over the next week Claritin 10 mg daily as needed for drainage Fluids and rest Continue on Symbicort 2 puffs twice daily. Rinse after use .  Albuterol inhaler or Duoneb As needed  For wheezing .  Will check Dupixent process.  Follow up with Dr. Loanne Drilling in 4-6 weeks and As needed   Please contact office for sooner follow up if symptoms do not improve or worsen or seek emergency care       Follow Up Instructions: Follow-up in 4 to 6 weeks  and as needed Please contact office for sooner follow up if symptoms do not improve or worsen or seek emergency care     I discussed the assessment and treatment plan with the patient. The patient was provided an opportunity to ask questions and all were answered. The patient agreed with the plan and demonstrated an understanding of the instructions.   The patient was advised to call back or seek an in-person evaluation if the symptoms worsen or if the condition fails to improve as anticipated.  I provided 24  minutes of non-face-to-face time during this encounter.   Rexene Edison, NP

## 2019-09-05 NOTE — Telephone Encounter (Signed)
Received a fax from [facility] needing clinical documentation. Requested documentation faxed again to [phone]. Will wait decision.

## 2019-09-06 NOTE — Telephone Encounter (Signed)
Received call back from [place], [place].  Patient has been approved for Dupixent maintenance dose 08/09/19 through 2021. Janett Billow stated she is processing Patient as approved. A new Dupixent PA will be needed for loading dose through [place] [employer]. Called [place] [facility], spoke with [place].  Leonides Sake stated [place] PA on file is approved for 12 units, loading and maintenance doses.  Leonides Sake stated [employer] is not showing as approved [employer], and that may be the issue.   Ref# [id number] for [place] [facility] call. Psychologist, sport and exercise to update on PA approval.   Spoke with  Apolonio Schneiders, Dupixent team at [employer].  Updated Apolonio Schneiders on approval for loading and maintenance dose. Apolonio Schneiders stated they would follow up and contact [facility] to schedule shipment.

## 2019-09-06 NOTE — Telephone Encounter (Signed)
Received call from [facility], Janett Billow.  Janett Billow stated they have not received any information via fax on Patient. Patient information has been faxed numerous times, for several days,  to requested fax number. PA form was completed and faxed to [place] by [place], 08/09/19. Janett Billow stated she would contact [place] [facility] for PA update and call office back To update on status.

## 2019-09-12 ENCOUNTER — Ambulatory Visit: Payer: Medicaid Other | Admitting: Physician Assistant

## 2019-09-12 ENCOUNTER — Encounter: Payer: Self-pay | Admitting: Physician Assistant

## 2019-09-12 DIAGNOSIS — D3501 Benign neoplasm of right adrenal gland: Secondary | ICD-10-CM

## 2019-09-12 DIAGNOSIS — N83202 Unspecified ovarian cyst, left side: Secondary | ICD-10-CM

## 2019-09-12 DIAGNOSIS — R1030 Lower abdominal pain, unspecified: Secondary | ICD-10-CM

## 2019-09-12 DIAGNOSIS — Q248 Other specified congenital malformations of heart: Secondary | ICD-10-CM

## 2019-09-12 MED ORDER — PROMETHAZINE HCL 25 MG PO TABS: 25.0000 mg | ORAL_TABLET | Freq: Three times a day (TID) | ORAL | 0 refills | Status: DC | PRN

## 2019-09-12 NOTE — Progress Notes (Signed)
There were no vitals taken for this visit.   Subjective:    Patient ID: Linda Ellis, female    DOB: [birth year], 44 y.o.   MRN: [id number]  HPI: Linda Ellis is a 44 y.o. female presenting on 09/12/2019 for No chief complaint on file.   HPI   This is a telemedicine appointment through [employer] due to coronavirus pandemic.  I connected with  Linda Ellis on 09/12/19 by a video enabled telemedicine application and verified that I am speaking with the correct person using two identifiers.   I discussed the limitations of evaluation and management by telemedicine. The patient expressed understanding and agreed to proceed.  Pt is at home.  Provider is at office.      Pt appointment today is for return of abdominal cramping.   Pt abdominal surgical history remarkable of cholecystectomy and unilateral salpingectomy due to ectopic pregnancy.     Pt was seen for flank pain on 08/23/19- UA and cbc were obtained.  Pt was given rx keflex for uti and she got better and then after abx finished she started cramiping again.  She says her menses came on 3 week early.     She Only had 1 episode of emeisis and that was several days ago but she is having lots of nausea.   She did feel she had the chills one night 3 or 4 nights ago but she didn't check her temperature and she has not felt the chills since that time. She is eating.   She had 1 episode of diarrhea several days ago and is now moving her bowels normally.  No constipation.  No blood PR.   Pt has L ovarian cyst as seen on CT done 10/17/18.  Also a pericardial cyst and adrenal adenomal  This does not feel like the pain she had when she was seen in ER on 10/2018 which was upper abd and today feels like bad menstrual cramping in the lower abd.  It started on the right side and now is on the left side.   This pain started around 09/04/19.    (started after she finished keflex given for cystitis)  Labs from 08/23/19 reviewed  Her CAFA is  UTD    Relevant past medical, surgical, family and social history reviewed and updated as indicated. Interim medical history since our last visit reviewed. Allergies and medications reviewed and updated.    Current Outpatient Medications:  .  acetaminophen (TYLENOL) 500 MG tablet, Take 500-1,000 mg by mouth every 6 (six) hours as needed., Disp: , Rfl:  .  albuterol (VENTOLIN HFA) 108 (90 Base) MCG/ACT inhaler, Inhale 1-2 puffs into the lungs every 6 (six) hours as needed for wheezing or shortness of breath., Disp: 8 g, Rfl: 6 .  budesonide-formoterol (SYMBICORT) 160-4.5 MCG/ACT inhaler, Inhale 2 puffs into the lungs 2 (two) times daily., Disp: 1 Inhaler, Rfl: 0 .  ipratropium-albuterol (DUONEB) 0.5-2.5 (3) MG/3ML SOLN, Take 3 mLs by nebulization every 6 (six) hours as needed., Disp: 360 mL, Rfl: 1 .  loratadine (CLARITIN) 10 MG tablet, Take 1 tablet (10 mg total) by mouth daily as needed., Disp: 90 tablet, Rfl: 0 .  pantoprazole (PROTONIX) 20 MG tablet, Take 20 mg by mouth daily., Disp: , Rfl:  .  pantoprazole (PROTONIX) 40 MG tablet, Take 1 tablet (40 mg total) by mouth daily as needed. (Patient not taking: Reported on 09/04/2019), Disp: 30 tablet, Rfl: 3   Review of Systems  Per HPI  unless specifically indicated above     Objective:    There were no vitals taken for this visit.  Wt Readings from Last 3 Encounters:  03/02/19 (!) 328 lb (148.8 kg)  02/13/19 (!) 328 lb 6.4 oz (149 kg)  02/08/19 (!) 303 lb 15.9 oz (137.9 kg)    Physical Exam Constitutional:      General: She is not in acute distress.    Appearance: She is obese. She is not toxic-appearing.     Comments: Pt was awakened by call for appointment so she is still sitting in bed with her pajamas and bed head.  HENT:     Head: Normocephalic and atraumatic.  Pulmonary:     Effort: Pulmonary effort is normal. No respiratory distress.  Neurological:     Mental Status: She is alert and oriented to person, place, and time.   Psychiatric:        Attention and Perception: Attention normal.        Mood and Affect: Mood normal.        Speech: Speech normal.        Behavior: Behavior is cooperative.        Thought Content: Thought content normal.           Assessment & Plan:    Encounter Diagnoses  Name Primary?  . Lower abdominal pain Yes  . Cyst of left ovary   . Adenoma of right adrenal gland   . Pericardial cyst   \  rx phenergan for nausea; pt counseled to avoid driving on this rx due to sedation  Due to pt adrenal adenoma and pericardial cyst, will get CT to re-evaluate ovarian cyst and abdominal pain (instead of pelvic US).  Discussed with pt that ovarian cyst is likely culprit of her abdominal pain.  She agrees

## 2019-09-12 NOTE — Telephone Encounter (Signed)
Dealer.  There has been no update on Patient. I was transferred to VM and left detailed message for someone to call [facility] for  Dupixent update. Will follow up.

## 2019-09-13 ENCOUNTER — Other Ambulatory Visit: Payer: Self-pay

## 2019-09-13 ENCOUNTER — Ambulatory Visit (HOSPITAL_COMMUNITY)
Admission: RE | Admit: 2019-09-13 | Discharge: 2019-09-13 | Disposition: A | Discharge status: Home / Self Care | Payer: Self-pay | Source: Ambulatory Visit | Attending: Physician Assistant | Admitting: Physician Assistant

## 2019-09-13 DIAGNOSIS — R1030 Lower abdominal pain, unspecified: Secondary | ICD-10-CM | POA: Insufficient documentation

## 2019-09-13 MED ORDER — IOHEXOL 300 MG/ML  SOLN
100.0000 mL | Freq: Once | INTRAMUSCULAR | Status: AC | PRN
  Administered 2019-09-13: 17:00:00 100 mL via INTRAVENOUS

## 2019-09-13 NOTE — Telephone Encounter (Signed)
Spoke with Juanda Crumble, [place].  Since we have been told Dupixent has been approved, but have not heard anything further from [facility], Juanda Crumble stated Dupixent will look into what, and if anything further is needed. Charles stated Dupixent will contact office with further updates.

## 2019-09-14 NOTE — Telephone Encounter (Signed)
Spoke with Linda Ellis, Dupixent [place].  Patient Dupixent PA is being processed at this time.  Linda Ellis stated we should hear from [place] next week.

## 2019-09-19 ENCOUNTER — Telehealth: Payer: Self-pay | Admitting: Adult Health

## 2019-09-19 NOTE — Telephone Encounter (Signed)
There have been no fax received on Patient. Dealer, spoke with Vivien Rota. Vivien Rota stated last OV note, labs,medication list was needed.  Vivien Rota requested information be faxed to [phone], Attention [place]. Requested information faxed to [place].  Will follow up in other encounter.

## 2019-09-19 NOTE — Telephone Encounter (Signed)
Message was received from [place] stating a fax had been sent to [facility] on Patient 09/14/19. There have been no fax received on Patient. Dealer, spoke with Vivien Rota. Vivien Rota stated last OV note, labs,medication list was needed.  Vivien Rota requested information be faxed to [phone], Attention [place]. Requested information faxed to [place].

## 2019-09-25 ENCOUNTER — Ambulatory Visit (INDEPENDENT_AMBULATORY_CARE_PROVIDER_SITE_OTHER): Payer: Medicaid Other | Admitting: Pulmonary Disease

## 2019-09-25 ENCOUNTER — Encounter: Payer: Self-pay | Admitting: Pulmonary Disease

## 2019-09-25 ENCOUNTER — Telehealth: Payer: Self-pay | Admitting: Pulmonary Disease

## 2019-09-25 DIAGNOSIS — Z87891 Personal history of nicotine dependence: Secondary | ICD-10-CM

## 2019-09-25 DIAGNOSIS — Z7952 Long term (current) use of systemic steroids: Secondary | ICD-10-CM

## 2019-09-25 DIAGNOSIS — Z6841 Body Mass Index (BMI) 40.0 and over, adult: Secondary | ICD-10-CM

## 2019-09-25 DIAGNOSIS — Z79899 Other long term (current) drug therapy: Secondary | ICD-10-CM

## 2019-09-25 DIAGNOSIS — J4551 Severe persistent asthma with (acute) exacerbation: Secondary | ICD-10-CM

## 2019-09-25 MED ORDER — DOXYCYCLINE HYCLATE 100 MG PO TABS: 100.0000 mg | ORAL_TABLET | Freq: Two times a day (BID) | ORAL | 0 refills | Status: DC

## 2019-09-25 MED ORDER — PREDNISONE 10 MG PO TABS: ORAL_TABLET | ORAL | 0 refills | Status: AC

## 2019-09-25 NOTE — Assessment & Plan Note (Signed)
Former smoker Quit July/2020 31-pack-year smoking history  Plan: Schedule pulmonary function test in 2 weeks
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
## 2019-09-25 NOTE — Assessment & Plan Note (Signed)
Currently receives Symbicort 160 through [place] for me  Plan: Patient may be a good candidate for trial of Breztri at next OV, especially since she is already established with [place] for me

## 2019-09-25 NOTE — Patient Instructions (Addendum)
You were seen today by Lauraine Rinne, NP  for:   1. Severe persistent asthma with acute exacerbation  - predniSONE (DELTASONE) 10 MG tablet; Take 4 tablets (40 mg total) by mouth daily with breakfast for 3 days, THEN 3 tablets (30 mg total) daily with breakfast for 3 days, THEN 2 tablets (20 mg total) daily with breakfast for 3 days, THEN 1 tablet (10 mg total) daily with breakfast for 3 days.  Dispense: 30 tablet; Refill: 0 - doxycycline (VIBRA-TABS) 100 MG tablet; Take 1 tablet (100 mg total) by mouth 2 (two) times daily.  Dispense: 14 tablet; Refill: 0  Continue Symbicort 160 >>> 2 puffs in the morning right when you wake up, rinse out your mouth after use, 12 hours later 2 puffs, rinse after use >>> Take this daily, no matter what >>> This is not a rescue inhaler   Complete pulmonary function testing as scheduled in 2 weeks  Our Biologics team will work again to see if they can get you qualified for [place]  2.  Morbid obesity with BMI of 50.0-59.9, adult ([place])  Great job working on reducing your BMI!  Congratulations on losing 29 pounds  Keep up the hard work  3. Former smoker  Engineer, manufacturing job stopping smoking  We will order a pulmonary function test to further evaluate your breathing  5. Medication management  Could consider trial of Breztri inhaler at next office visit   We recommend today:   Meds ordered this encounter  Medications  . predniSONE (DELTASONE) 10 MG tablet    Sig: Take 4 tablets (40 mg total) by mouth daily with breakfast for 3 days, THEN 3 tablets (30 mg total) daily with breakfast for 3 days, THEN 2 tablets (20 mg total) daily with breakfast for 3 days, THEN 1 tablet (10 mg total) daily with breakfast for 3 days.    Dispense:  30 tablet    Refill:  0  . doxycycline (VIBRA-TABS) 100 MG tablet    Sig: Take 1 tablet (100 mg total) by mouth 2 (two) times daily.    Dispense:  14 tablet    Refill:  0    Follow Up:    Return in about 2 weeks (around  10/09/2019), or if symptoms worsen or fail to improve, for Follow up with Dr. Loanne Drilling, Follow up for PFT.   Please do your part to reduce the spread of COVID-19:      Reduce your risk of any infection  and COVID19 by using the similar precautions used for avoiding the common cold or flu:  Marland Kitchen Wash your hands often with soap and warm water for at least 20 seconds.  If soap and water are not readily available, use an alcohol-based hand sanitizer with at least 60% alcohol.  . If coughing or sneezing, cover your mouth and nose by coughing or sneezing into the elbow areas of your shirt or coat, into a tissue or into your sleeve (not your hands). Langley Gauss A MASK when in public  . Avoid shaking hands with others and consider head nods or verbal greetings only. . Avoid touching your eyes, nose, or mouth with unwashed hands.  . Avoid close contact with people who are sick. . Avoid places or events with large numbers of people in one location, like concerts or sporting events. . If you have some symptoms but not all symptoms, continue to monitor at home and seek medical attention if your symptoms worsen. . If you are having  a medical emergency, call 911.   [place] / e-Visit: [email or web address]         [facility]: [facility]: [id number]                   [facility]: [id number]     It is flu season:   >>> Best ways to protect herself from the flu: Receive the yearly flu vaccine, practice good hand hygiene washing with soap and also using hand sanitizer when available, eat a nutritious meals, get adequate rest, hydrate appropriately   Please contact the office if your symptoms worsen or you have concerns that you are not improving.   Thank you for choosing [facility] for your healthcare, and for allowing [place] to partner with  you on your healthcare journey. I am thankful to be able to provide care to you today.   Wyn Quaker FNP-C

## 2019-09-25 NOTE — Telephone Encounter (Signed)
[place] to follow up on this pt's PA. Spoke with [employer]. She states that they did not get the fax that was sent on 09/19/2019. All information will be faxed AGAIN to [phone] per Loretta's request.

## 2019-09-25 NOTE — Assessment & Plan Note (Addendum)
History of recurrent exacerbations requiring prednisone tapers Currently trying to get Dupixent approved for the patient No formal pulmonary function testing on file Morbid obesity Elevated IgE and EOS in the past  Plan: Prednisone taper today Doxycycline today Continue Symbicort 160 Could consider trial of Breztri at next ov Schedule patient for pulmonary function testing in 2 weeks with Covid testing Schedule patient for an office follow-up with Dr. Loanne Drilling in 2 weeks If we continue to have issues with [place] for [place] likely we will need to consider different biologic agent in 2 weeks Emphasized importance and congratulated patient with her recent weight loss

## 2019-09-25 NOTE — Progress Notes (Signed)
Virtual Visit via Telephone Note  I connected with Linda Ellis on 09/25/19 at  4:30 PM EST by telephone and verified that I am speaking with the correct person using two identifiers.  Location: Patient: Home Provider: Office [facility] - [id number], [place], [place], [place] [address]   I discussed the limitations, risks, security and privacy concerns of performing an evaluation and management service by telephone and the availability of in person appointments. I also discussed with the patient that there may be a patient responsible charge related to this service. The patient expressed understanding and agreed to proceed.  Patient consented to consult via telephone: Yes People present and their role in pt care: Pt    History of Present Illness:  44 year old former smoker followed in our office for severe persistent asthma  Past medical history: Morbid obesity, GERD Smoking history: Former smoker.  Quit July/2020.  1 pack/day.  31-pack-year smoking history Maintenance: Symbicort 160 Patient of Dr. Loanne Drilling  Chief complaint: Cough / Wheezing   44 year old female former smoker followed in our office for suspected asthma.  Patient does not have any pulmonary function test on file.  Patient also has a 31-pack-year smoking history.  This is an acute visit today she reports that she has had 1 month of wheezing and increased shortness of breath.  She reports that she had minor symptomatic improvement with a shorter prednisone taper at the beginning of this month.  Patient continues to use Symbicort 160 which she receives from the [facility] manager.  She continues to use her Claritin.  She does have nasal congestion and a productive cough with clear mucus.  Occasional yellow nasal drainage.  Patient has been working diligently on losing weight.  She is currently lost 29 pounds.  See message from Dr. Cordelia Pen last note in 2020:   History of recurrant prednisone  tapers:  Steroids Received in 2019 and 2020 2019 Jan Feb March April May June July Aug Sept Oct Nov Dec   X X XX XX  XX X XX XXX  XX XXX  2020 Jan Feb March April May June July Aug Sept Oct Nov Dec   X X XXXX   X XXX  X   XX   We will discuss this with the patient today.  She continues to have a history of recurrent exacerbations with her breathing requiring prednisone tapers.  Patient reports that she had to cancel her scheduled pulmonary function test previously due to having an exacerbation.  She reports that no one else has called her back to get her scheduled for breathing test.  Our Biologics team is currently working to try to get the patient qualified for [place].  There have been ongoing issues with the [place] to get the [place] approved.  We will have our Biologics team follow-up with [place] today.  Observations/Objective:  02/13/2019-IgE-680  02/13/2019-CBC with differential-Eosinophils relative 0.8, eosinophils absolute 0.1 - ? On prednisone  02/08/2019-chest x-ray-chronic bronchial thickening likely related asthma, no acute abnormality  Social History   Tobacco Use  Smoking Status Former Smoker  . Packs/day: 1.00  . Years: 31.00  . Pack years: 31.00  . Types: Cigarettes  . Start date: 43  . Quit date: 02/24/2019  . Years since quitting: 0.5  Smokeless Tobacco Never Used  Tobacco Comment   are currently randomly smoking with stress 02/13/19   Immunization History  Administered Date(s) Administered  . Influenza,inj,Quad PF,6+ Mos 05/04/2018  Assessment and Plan:  Severe persistent asthma dependent on systemic steroids with acute exacerbation History of recurrent exacerbations requiring prednisone tapers Currently trying to get Dupixent approved for the patient No formal pulmonary function testing on file Morbid obesity Elevated IgE and EOS in the past  Plan: Prednisone taper today Doxycycline today Continue  Symbicort 160 Could consider trial of Breztri at next ov Schedule patient for pulmonary function testing in 2 weeks with Covid testing Schedule patient for an office follow-up with Dr. Loanne Drilling in 2 weeks If we continue to have issues with [place] for [place] likely we will need to consider different biologic agent in 2 weeks Emphasized importance and congratulated patient with her recent weight loss    Former smoker Former smoker Quit July/2020 31-pack-year smoking history  Plan: Schedule pulmonary function test in 2 weeks  Morbid obesity with BMI of 50.0-59.9, adult ([place]) Congratulated patient on her recent weight loss of 29 pounds  Plan: Emphasized importance of recent weight loss Encourage patient to continue to work on reducing BMI Follow a low carbohydrate diet and avoid sugar Working to reduce her BMI by 10% can be the equivalent of adding an inhaler for management of her suspected asthma  Medication management Currently receives Symbicort 160 through [place] for me  Plan: Patient may be a good candidate for trial of Breztri at next OV, especially since she is already established with [place] for me   Follow Up Instructions:  Return in about 2 weeks (around 10/09/2019), or if symptoms worsen or fail to improve, for Follow up with Dr. Loanne Drilling, Follow up for PFT.   I discussed the assessment and treatment plan with the patient. The patient was provided an opportunity to ask questions and all were answered. The patient agreed with the plan and demonstrated an understanding of the instructions.   The patient was advised to call back or seek an in-person evaluation if the symptoms worsen or if the condition fails to improve as anticipated.  I provided 30 minutes of non-face-to-face time during this encounter.   Lauraine Rinne, NP

## 2019-09-25 NOTE — Assessment & Plan Note (Signed)
Congratulated patient on her recent weight loss of 29 pounds  Plan: Emphasized importance of recent weight loss Encourage patient to continue to work on reducing BMI Follow a low carbohydrate diet and avoid sugar Working to reduce her BMI by 10% can be the equivalent of adding an inhaler for management of her suspected asthma

## 2019-09-28 MED ORDER — DUPIXENT 200 MG/1.14ML ~~LOC~~ SOSY: 200.0000 mg | PREFILLED_SYRINGE | SUBCUTANEOUS | 12 refills | Status: DC

## 2019-09-28 MED ORDER — DUPIXENT 200 MG/1.14ML ~~LOC~~ SOSY: 400.0000 mg | PREFILLED_SYRINGE | Freq: Once | SUBCUTANEOUS | 0 refills | Status: DC

## 2019-09-28 NOTE — Telephone Encounter (Signed)
We keep receiving faxing stating that our faxes being sent to [facility] are failing. I have contacted [employer] to follow up on the pt's PA. Pt's PA for Dupixent was approved on 08/09/2019, authorization is good through 02/05/2020. Rx for Dupixent will be sent to [facility] as [facility] has not been easy to work with. Will follow up with pharmacy to set up shipment.

## 2019-09-29 ENCOUNTER — Telehealth: Payer: Self-pay | Admitting: Pulmonary Disease

## 2019-10-02 MED ORDER — DUPIXENT 200 MG/1.14ML ~~LOC~~ SOSY: 200.0000 mg | PREFILLED_SYRINGE | SUBCUTANEOUS | 12 refills | Status: DC

## 2019-10-02 MED ORDER — DUPIXENT 200 MG/1.14ML ~~LOC~~ SOSY: 400.0000 mg | PREFILLED_SYRINGE | Freq: Once | SUBCUTANEOUS | 0 refills | Status: DC

## 2019-10-02 NOTE — Telephone Encounter (Signed)
New prescription has been sent to [facility] with this information.

## 2019-10-03 NOTE — Telephone Encounter (Signed)
Contacted [facility] to set up shipment of [place]. Was advised that the pt's prescription was still being processed by their insurance team. Pt's insurance information has been given to them as they did not have the pt's insurance on file, nor could they get in touch the pt to get her information. Will continue to follow up.

## 2019-10-05 NOTE — Telephone Encounter (Signed)
Attempted to contact [facility] to follow up on pt's [place] shipment. Was on hold for over 15 minutes with no one coming to the line. Will try back.

## 2019-10-06 ENCOUNTER — Other Ambulatory Visit (HOSPITAL_COMMUNITY)
Admission: RE | Admit: 2019-10-06 | Discharge: 2019-10-06 | Disposition: A | Discharge status: Home / Self Care | Payer: Medicaid Other | Source: Ambulatory Visit | Attending: Pulmonary Disease | Admitting: Pulmonary Disease

## 2019-10-06 ENCOUNTER — Other Ambulatory Visit (HOSPITAL_COMMUNITY): Payer: Medicaid Other

## 2019-10-06 ENCOUNTER — Other Ambulatory Visit: Payer: Self-pay

## 2019-10-06 DIAGNOSIS — Z01812 Encounter for preprocedural laboratory examination: Secondary | ICD-10-CM | POA: Insufficient documentation

## 2019-10-06 DIAGNOSIS — Z20822 Contact with and (suspected) exposure to covid-19: Secondary | ICD-10-CM | POA: Diagnosis not present

## 2019-10-07 LAB — SARS CORONAVIRUS 2 (TAT 6-24 HRS): SARS Coronavirus 2: NEGATIVE

## 2019-10-09 ENCOUNTER — Other Ambulatory Visit: Payer: Self-pay

## 2019-10-09 ENCOUNTER — Ambulatory Visit (INDEPENDENT_AMBULATORY_CARE_PROVIDER_SITE_OTHER): Payer: Medicaid Other | Admitting: Pulmonary Disease

## 2019-10-09 ENCOUNTER — Encounter: Payer: Self-pay | Admitting: Pulmonary Disease

## 2019-10-09 ENCOUNTER — Ambulatory Visit (INDEPENDENT_AMBULATORY_CARE_PROVIDER_SITE_OTHER): Payer: Self-pay | Admitting: Pulmonary Disease

## 2019-10-09 VITALS — BP 124/80 | HR 90 | Temp 98.0°F | Ht 65.0 in | Wt 299.0 lb

## 2019-10-09 DIAGNOSIS — Z79899 Other long term (current) drug therapy: Secondary | ICD-10-CM

## 2019-10-09 DIAGNOSIS — Z6841 Body Mass Index (BMI) 40.0 and over, adult: Secondary | ICD-10-CM

## 2019-10-09 DIAGNOSIS — Z87891 Personal history of nicotine dependence: Secondary | ICD-10-CM

## 2019-10-09 DIAGNOSIS — E669 Obesity, unspecified: Secondary | ICD-10-CM | POA: Insufficient documentation

## 2019-10-09 DIAGNOSIS — J45909 Unspecified asthma, uncomplicated: Secondary | ICD-10-CM

## 2019-10-09 DIAGNOSIS — Z7952 Long term (current) use of systemic steroids: Secondary | ICD-10-CM

## 2019-10-09 DIAGNOSIS — J4551 Severe persistent asthma with (acute) exacerbation: Secondary | ICD-10-CM

## 2019-10-09 LAB — PULMONARY FUNCTION TEST
DL/VA % pred: 135 %
DL/VA: 5.96 ml/min/mmHg/L
DLCO cor % pred: 109 %
DLCO cor: 24.64 ml/min/mmHg
DLCO unc % pred: 100 %
DLCO unc: 22.61 ml/min/mmHg
FEF 25-75 Post: 0.99 L/sec
FEF 25-75 Pre: 0.76 L/sec
FEF2575-%Change-Post: 31 %
FEF2575-%Pred-Post: 31 %
FEF2575-%Pred-Pre: 24 %
FEV1-%Change-Post: 6 %
FEV1-%Pred-Post: 50 %
FEV1-%Pred-Pre: 47 %
FEV1-Post: 1.57 L
FEV1-Pre: 1.48 L
FEV1FVC-%Change-Post: 2 %
FEV1FVC-%Pred-Pre: 75 %
FEV6-%Change-Post: 3 %
FEV6-%Pred-Post: 65 %
FEV6-%Pred-Pre: 62 %
FEV6-Post: 2.44 L
FEV6-Pre: 2.35 L
FEV6FVC-%Change-Post: 0 %
FEV6FVC-%Pred-Post: 101 %
FEV6FVC-%Pred-Pre: 100 %
FVC-%Change-Post: 3 %
FVC-%Pred-Post: 64 %
FVC-%Pred-Pre: 62 %
FVC-Post: 2.46 L
FVC-Pre: 2.37 L
Post FEV1/FVC ratio: 64 %
Post FEV6/FVC ratio: 99 %
Pre FEV1/FVC ratio: 62 %
Pre FEV6/FVC Ratio: 99 %
RV % pred: 170 %
RV: 2.82 L
TLC % pred: 97 %
TLC: 5.08 L

## 2019-10-09 MED ORDER — PREDNISONE 10 MG PO TABS: ORAL_TABLET | ORAL | 0 refills | Status: DC

## 2019-10-09 NOTE — Assessment & Plan Note (Signed)
Currently receives Symbicort 160 through [employer], [place] for me Patient has no insurance coverage for cost of [place] Patient reports that [place] out-of-pocket cost would be $6000 a month for her  Plan: Patient will need to complete patient assistance forms for EpiPen Patient will need to contact [place] my way to apply for financial hardship

## 2019-10-09 NOTE — Assessment & Plan Note (Signed)
Plan: Continue to not smoke 

## 2019-10-09 NOTE — Patient Instructions (Addendum)
You were seen today by Lauraine Rinne, NP  for:   It was nice meeting you in person.  We will continue to work to see if we can get you qualified for [place].  I believe that this will hopefully help allow [place] to decrease your steroid usage.  Unfortunately you are still wheezing on exam today.  I will start you on a daily dose of prednisone we will see you back in 2 to 4 weeks.  Continue Symbicort.  Please contact [place] my way as discussed today.  That number is listed below.  If you have any questions or concerns please do not hesitate to give [place] a call.  Stay safe and please do not hesitate to reach out to [place] if you have additional questions or concerns or you are having worsened breathing,  Linda Ellis  1. Severe persistent asthma dependent on systemic steroids with acute exacerbation  - predniSONE (DELTASONE) 10 MG tablet; Take 2 tablets (20 mg total) by mouth daily with breakfast for 7 days, THEN 1 tablet (10 mg total) daily with breakfast for 21 days.  Dispense: 35 tablet; Refill: 0  Continue Symbicort 160 >>> 2 puffs in the morning right when you wake up, rinse out your mouth after use, 12 hours later 2 puffs, rinse after use >>> Take this daily, no matter what >>> This is not a rescue inhaler   Can use DuoNeb nebulized medications as needed for shortness of breath or wheezing  We will start you on a daily dose of prednisone  Please contact Dupixent my way as discussed today.  So we can hopefully get you started on Dupixent.  Continue Claritin  Start nasal saline rinses, samples provided today >>> Use distilled water >>>Warm bottle to lukewarm temperature like a baby bottle >>>Shake well  2. Former smoker  Continue to not smoke  3. Class 3 severe obesity with serious comorbidity and body mass index (BMI) of 45.0 to 49.9 in adult, unspecified obesity type ([place])  Continue to work diligently on working to reduce her BMI.  This will help with management of chronic disease especially  your breathing.  Great work so far on losing 29 pounds!  Were proud of you!  4. Medication management  Please contact Dupixent my way: [phone] >>> Let them know you do not have any insurance coverage to help cover your Dupixent if they need formal records or a printed prescription we are more than happy to coordinate this please just let us know   Please also fill out the patient assistance forms for an EpiPen which we provided for you today  We recommend today:   Meds ordered this encounter  Medications  . predniSONE (DELTASONE) 10 MG tablet    Sig: Take 2 tablets (20 mg total) by mouth daily with breakfast for 7 days, THEN 1 tablet (10 mg total) daily with breakfast for 21 days.    Dispense:  35 tablet    Refill:  0    Follow Up:    Return in about 2 weeks (around 10/23/2019), or if symptoms worsen or fail to improve, for Follow up with Dr. Loanne Drilling.   Please do your part to reduce the spread of COVID-19:      Reduce your risk of any infection  and COVID19 by using the similar precautions used for avoiding the common cold or flu:  Marland Kitchen Wash your hands often with soap and warm water for at least 20 seconds.  If soap and water are  not readily available, use an alcohol-based hand sanitizer with at least 60% alcohol.  . If coughing or sneezing, cover your mouth and nose by coughing or sneezing into the elbow areas of your shirt or coat, into a tissue or into your sleeve (not your hands). Langley Gauss A MASK when in public  . Avoid shaking hands with others and consider head nods or verbal greetings only. . Avoid touching your eyes, nose, or mouth with unwashed hands.  . Avoid close contact with people who are sick. . Avoid places or events with large numbers of people in one location, like concerts or sporting events. . If you have some symptoms but not all symptoms, continue to monitor at home and seek medical attention if your symptoms worsen. . If you are having a medical  emergency, call 911.   [place] / e-Visit: [email or web address]         [facility]: [facility]: [id number]                   [facility]: [id number]     It is flu season:   >>> Best ways to protect herself from the flu: Receive the yearly flu vaccine, practice good hand hygiene washing with soap and also using hand sanitizer when available, eat a nutritious meals, get adequate rest, hydrate appropriately   Please contact the office if your symptoms worsen or you have concerns that you are not improving.   Thank you for choosing [facility] for your healthcare, and for allowing [place] to partner with you on your healthcare journey. I am thankful to be able to provide care to you today.   Wyn Quaker FNP-C

## 2019-10-09 NOTE — Progress Notes (Signed)
PFT done today. 

## 2019-10-09 NOTE — Assessment & Plan Note (Signed)
Patient has recently lost 29 pounds Congratulated patient's on hard work  Plan: Emphasized the importance of continuing to work to reduce BMI

## 2019-10-09 NOTE — Assessment & Plan Note (Signed)
History of recurrent exacerbations requiring prednisone tapers Morbid obesity Elevated IgE and EOS in the past Limited medication coverage from insurance standpoint to help with [place] Patient will need to apply for Dupixent my way financial hardship Pulmonary function test shows chronic obstructive asthma, potential asthma COPD overlap syndrome Inspiratory and expiratory wheezes on exam today   Plan: We will start daily prednisone today Continue Symbicort 160 Need to contact [place] my way as outlined on AVS to apply for financial hardship Fill out patient assistance forms for EpiPen 2 to 4-week follow-up with Dr. Loanne Drilling or myself Emphasized importance and congratulated patient with her recent weight loss

## 2019-10-09 NOTE — Telephone Encounter (Signed)
Spoke with Aaron Edelman in regards to the pt's medication. Pt no longer has Medicaid prescription coverage. Medicaid is only for family planning. Levada Schilling that the pt would need to contact [place] [facility] and express financial hardship and she should qualify for patient assistance. Will continue to follow up.

## 2019-10-09 NOTE — Progress Notes (Signed)
@Patient  ID: Linda Ellis, female    DOB: 1975-11-13, 44 y.o.   MRN: [id number]  Chief Complaint  Patient presents with  . Follow-up    PFTs    Referring provider: [name], PA-C  HPI:  44 year old former smoker followed in our office for severe persistent asthma  Past medical history: Morbid obesity, GERD Smoking history: Former smoker.  Quit July/2020.  1 pack/day.  31-pack-year smoking history Maintenance: Symbicort 160 Patient of Dr. 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  Patient frequently requires steroids.  She has never had pulmonary function testing until today.  Pulmonary function testing results are listed below:  10/09/2019-pulmonary function test-FVC 2.37 (62% predicted), postbronchodilator ratio 64, postbronchodilator FEV1 1.57 (50% predicted), no bronchodilator response, DLCO 22.61 (100% predicted)  Patient just completed a extended course of prednisone.  She continues to be maintained on Symbicort 160.  She reports she has used her DuoNeb's 2-3 times a day.  She has never had formal allergy testing.  Questionaires / Pulmonary Flowsheets:   ACT:  Asthma Control Test ACT Total Score  10/09/2019 10    Tests:   10/17/2018 - CBC with Diff - eos 5, eos absolute 0.5  02/13/2019-IgE-680  02/13/2019-CBC with differential-Eosinophils relative 0.8, eosinophils absolute 0.1 - ? On prednisone  02/08/2019-chest x-ray-chronic bronchial thickening likely related asthma, no acute abnormality  10/09/2019-pulmonary function test-FVC 2.37 (62% predicted), postbronchodilator ratio 64, postbronchodilator FEV1 1.57 (50% predicted), no bronchodilator response, DLCO 22.61 (100% predicted)   FENO:  No results found for: NITRICOXIDE  PFT: PFT Results Latest Ref Rng & Units 10/09/2019  FVC-Pre L 2.37  FVC-Predicted Pre % 62  FVC-Post L 2.46  FVC-Predicted Post % 64  Pre FEV1/FVC % % 62   Post FEV1/FCV % % 64  FEV1-Pre L 1.48  FEV1-Predicted Pre % 47  FEV1-Post L 1.57  DLCO UNC% % 100  DLCO COR %Predicted % 135  TLC L 5.08  TLC % Predicted % 97  RV % Predicted % 170    WALK:  No flowsheet data found.  Imaging: CT Abdomen Pelvis W Contrast  Result Date: 09/13/2019 CLINICAL DATA:  Lower abd pain X 1 month w/nausea, cyst on left ovary, adenoma of rt adrenal gland EXAM: CT ABDOMEN AND PELVIS WITH CONTRAST TECHNIQUE: Multidetector CT imaging of the abdomen and pelvis was performed using the standard protocol following bolus administration of intravenous contrast. CONTRAST:  166mL OMNIPAQUE IOHEXOL 300 MG/ML  SOLN COMPARISON:  CT abdomen pelvis 10/17/2018 FINDINGS: Lower chest: Stable right pericardial cyst measuring 3.2 cm. Lung bases are clear. Hepatobiliary: Stable tiny low-attenuation lesion in the right hepatic lobe is too small to fully characterize but stable from prior and likely represents a small cyst. Status post cholecystectomy. No intra or extrahepatic biliary duct dilation. Pancreas: Unremarkable. No pancreatic ductal dilatation or surrounding inflammatory changes. Spleen: Normal in size without focal abnormality. Adrenals/Urinary Tract: Redemonstrated 2.0 cm right adrenal adenoma. The left adrenal gland is normal in appearance. Stomach/Bowel: Stomach is within normal limits. Appendix appears normal. No evidence of bowel wall thickening, distention, or inflammatory changes. There are sigmoid diverticula without diverticulitis. Vascular/Lymphatic: No significant vascular findings are present. No enlarged abdominal or pelvic lymph nodes. Reproductive: Uterus and bilateral adnexa are unremarkable. The previously seen left ovarian cyst has resolved. Other: No abdominal wall hernia or abnormality. No abdominopelvic ascites. Musculoskeletal: No acute or significant osseous findings. IMPRESSION: 1. No acute  findings in the abdomen or pelvis. 2. Stable right adrenal adenoma. 3.  Sigmoid diverticulosis without diverticulitis. 4. The previously seen left ovarian cyst has resolved. 5. Unchanged right pericardial cyst. Electronically Signed   By: Audie Pinto M.D.   On: 09/13/2019 17:47    Lab Results:  CBC    Component Value Date/Time   WBC 15.3 (H) 02/13/2019 1036   RBC 4.38 02/13/2019 1036   HGB 13.6 02/13/2019 1036   HCT 40.4 02/13/2019 1036   PLT 419.0 (H) 02/13/2019 1036   MCV 92.2 02/13/2019 1036   MCH 29.7 10/17/2018 1234   MCHC 33.5 02/13/2019 1036   RDW 14.0 02/13/2019 1036   LYMPHSABS 5.9 (H) 02/13/2019 1036   MONOABS 0.9 02/13/2019 1036   EOSABS 0.1 02/13/2019 1036   BASOSABS 0.0 02/13/2019 1036    BMET    Component Value Date/Time   NA 139 08/23/2019 1125   K 3.8 08/23/2019 1125   CL 102 08/23/2019 1125   CO2 26 08/23/2019 1125   GLUCOSE 93 08/23/2019 1125   BUN 8 08/23/2019 1125   CREATININE 0.73 08/23/2019 1125   CALCIUM 9.0 08/23/2019 1125   GFRNONAA >60 08/23/2019 1125   GFRAA >60 08/23/2019 1125    BNP    Component Value Date/Time   BNP 23.0 04/09/2018 0118    ProBNP No results found for: PROBNP  Specialty Problems      Pulmonary Problems   Severe persistent asthma dependent on systemic steroids with acute exacerbation    10/17/2018 - CBC with Diff - eos 5, eos absolute 0.5  02/13/2019-IgE-680  02/13/2019-CBC with differential-Eosinophils relative 0.8, eosinophils absolute 0.1 - ? On prednisone  02/08/2019-chest x-ray-chronic bronchial thickening likely related asthma, no acute abnormality  10/09/2019-pulmonary function test-FVC 2.37 (62% predicted), postbronchodilator ratio 64, postbronchodilator FEV1 1.57 (50% predicted), no bronchodilator response, DLCO 22.61 (100% predicted)          No Known Allergies  Immunization History  Administered Date(s) Administered  . Influenza,inj,Quad PF,6+ Mos 05/04/2018    Past Medical History:  Diagnosis Date  . Asthma 2008  . COPD (chronic obstructive pulmonary  disease) ([place])   . GERD (gastroesophageal reflux disease)   . Multiple gastric ulcers   . Pneumonia     Tobacco History: Social History   Tobacco Use  Smoking Status Former Smoker  . Packs/day: 1.00  . Years: 31.00  . Pack years: 31.00  . Types: Cigarettes  . Start date: 69  . Quit date: 02/24/2019  . Years since quitting: 0.6  Smokeless Tobacco Never Used  Tobacco Comment   are currently randomly smoking with stress 02/13/19   Counseling given: Yes Comment: are currently randomly smoking with stress 02/13/19   Continue to not smoke  Outpatient Encounter Medications as of 10/09/2019  Medication Sig  . acetaminophen (TYLENOL) 500 MG tablet Take 500-1,000 mg by mouth every 6 (six) hours as needed.  Marland Kitchen albuterol (VENTOLIN HFA) 108 (90 Base) MCG/ACT inhaler Inhale 1-2 puffs into the lungs every 6 (six) hours as needed for wheezing or shortness of breath.  . budesonide-formoterol (SYMBICORT) 160-4.5 MCG/ACT inhaler Inhale 2 puffs into the lungs 2 (two) times daily.  Marland Kitchen ipratropium-albuterol (DUONEB) 0.5-2.5 (3) MG/3ML SOLN Take 3 mLs by nebulization every 6 (six) hours as needed.  . loratadine (CLARITIN) 10 MG tablet Take 1 tablet (10 mg total) by mouth daily as needed.  . pantoprazole (PROTONIX) 20 MG tablet Take 20 mg by mouth daily.  . pantoprazole (PROTONIX) 40 MG tablet Take 1  tablet (40 mg total) by mouth daily as needed.  . promethazine (PHENERGAN) 25 MG tablet Take 1 tablet (25 mg total) by mouth every 8 (eight) hours as needed for nausea or vomiting.  . [DISCONTINUED] doxycycline (VIBRA-TABS) 100 MG tablet Take 1 tablet (100 mg total) by mouth 2 (two) times daily.  . predniSONE (DELTASONE) 10 MG tablet Take 2 tablets (20 mg total) by mouth daily with breakfast for 7 days, THEN 1 tablet (10 mg total) daily with breakfast for 21 days.  . [DISCONTINUED] ALBUTEROL IN Inhale 2 puffs into the lungs as needed. For shortness of breath   . [DISCONTINUED] dupilumab (DUPIXENT) 200  MG/1.14ML prefilled syringe Inject 200 mg into the skin every 14 (fourteen) days. MAINTENANCE DOSE. SHIP MEDICATION TO MD OFFICE NOT THE PATIENT!  . [DISCONTINUED] predniSONE (DELTASONE) 10 MG tablet Take 2 tablets (20 mg total) by mouth daily with breakfast for 7 days, THEN 1 tablet (10 mg total) daily with breakfast for 21 days.   No facility-administered encounter medications on file as of 10/09/2019.     Review of Systems  Review of Systems  Constitutional: Positive for fatigue. Negative for activity change and fever.  HENT: Negative for sinus pressure, sinus pain and sore throat.   Respiratory: Positive for shortness of breath. Negative for cough and wheezing.   Cardiovascular: Negative for chest pain and palpitations.  Gastrointestinal: Negative for diarrhea, nausea and vomiting.  Musculoskeletal: Negative for arthralgias.  Neurological: Negative for dizziness.  Psychiatric/Behavioral: Negative for sleep disturbance. The patient is not nervous/anxious.      Physical Exam  BP 124/80   Pulse 90   Temp 98 F (36.7 C) (Temporal)   Ht 5\' 5"  (1.651 m)   Wt 299 lb (135.6 kg)   SpO2 95% Comment: on RA  BMI 49.76 kg/m   Wt Readings from Last 5 Encounters:  10/09/19 299 lb (135.6 kg)  03/02/19 (!) 328 lb (148.8 kg)  02/13/19 (!) 328 lb 6.4 oz (149 kg)  02/08/19 (!) 303 lb 15.9 oz (137.9 kg)  01/17/19 (!) 304 lb (137.9 kg)    BMI Readings from Last 5 Encounters:  10/09/19 49.76 kg/m  03/02/19 54.58 kg/m  02/13/19 56.37 kg/m  02/08/19 52.18 kg/m  01/17/19 52.18 kg/m     Physical Exam Vitals and nursing note reviewed.  Constitutional:      General: She is not in acute distress.    Appearance: Normal appearance. She is obese.  HENT:     Head: Normocephalic and atraumatic.     Right Ear: External ear normal. There is no impacted cerumen.     Left Ear: External ear normal. There is no impacted cerumen.     Mouth/Throat:     Mouth: Mucous membranes are moist.      Pharynx: Oropharynx is clear.  Eyes:     Pupils: Pupils are equal, round, and reactive to light.  Cardiovascular:     Rate and Rhythm: Normal rate and regular rhythm.     Pulses: Normal pulses.     Heart sounds: Normal heart sounds. No murmur.  Pulmonary:     Effort: Pulmonary effort is normal. No respiratory distress.     Breath sounds: No decreased air movement. Wheezing (Inspiratory and expiratory wheezes) present. No decreased breath sounds or rales.  Musculoskeletal:     Cervical back: Normal range of motion.  Skin:    General: Skin is warm and dry.     Capillary Refill: Capillary refill takes less than 2 seconds.  Comments: Tattoos on upper extremities bilaterally  Neurological:     General: No focal deficit present.     Mental Status: She is alert and oriented to person, place, and time. Mental status is at baseline.     Gait: Gait normal.  Psychiatric:        Mood and Affect: Mood normal.        Behavior: Behavior normal.        Thought Content: Thought content normal.        Judgment: Judgment normal.       Assessment & Plan:   Severe persistent asthma dependent on systemic steroids with acute exacerbation History of recurrent exacerbations requiring prednisone tapers Morbid obesity Elevated IgE and EOS in the past Limited medication coverage from insurance standpoint to help with Dupixent Patient will need to apply for Dupixent my way financial hardship Pulmonary function test shows chronic obstructive asthma, potential asthma COPD overlap syndrome Inspiratory and expiratory wheezes on exam today   Plan: We will start daily prednisone today Continue Symbicort 160 Need to contact [place] my way as outlined on AVS to apply for financial hardship Fill out patient assistance forms for EpiPen 2 to 4-week follow-up with Dr. Loanne Drilling or myself Emphasized importance and congratulated patient with her recent weight loss    Former smoker Plan: Continue to not  smoke  Medication management Currently receives Symbicort 160 through [place] for me Patient has no insurance coverage for cost of [place] Patient reports that [place] out-of-pocket cost would be $6000 a month for her  Plan: Patient will need to complete patient assistance forms for EpiPen Patient will need to contact [place] my way to apply for financial hardship  Obesity Patient has recently lost 29 pounds Congratulated patient's on hard work  Plan: Emphasized the importance of continuing to work to reduce BMI    Return in about 2 weeks (around 10/23/2019), or if symptoms worsen or fail to improve, for Follow up with Dr. Loanne Drilling.   Lauraine Rinne, NP 10/09/2019   This appointment required 45 minutes of patient care (this includes precharting, chart review, review of results, face-to-face care, etc.).

## 2019-10-11 NOTE — Telephone Encounter (Signed)
Spoke with Aaron Edelman in regards to the pt starting [place]. Aaron Edelman would like the pt to be started on samples. Pt is to be contacting [place] [facility] to get patient assistance as she does not have prescription drug coverage.  Spoke with pt. States that she has not been able to get anyone on the phone at [place] [facility]. I advised Aaron Edelman of this and he would actually like the pt to start on Fasenra instead of [place]. We have samples on hand in office of Fasenra to use for the pt. Due to the pt's financial situation, Aaron Edelman states that she does not have to have an Epipen on hand. We will monitor her more closely due to this, Aaron Edelman will monitor her along with myself. Pt is aware of all of this information. She has been scheduled for her first Fasenra injection on 10/13/2019 at 1000. Aaron Edelman is aware. Will leave message open to follow up on [place] paperwork.

## 2019-10-12 ENCOUNTER — Ambulatory Visit: Payer: Medicaid Other | Admitting: Pulmonary Disease

## 2019-10-12 NOTE — Progress Notes (Signed)
 @  Patient ID: Linda Ellis, female    DOB: 1976-06-27, 44 y.o.   MRN: [id number]  No chief complaint on file.   Referring provider: [name], PA-C  HPI:  44 year old former smoker followed in our office for severe persistent asthma  Past medical history: Morbid obesity, GERD Smoking history: Former smoker.  Quit July/2020.  1 pack/day.  31-pack-year smoking history Maintenance: Symbicort 160 Patient of Dr. Loanne Drilling  10/13/2019  - Visit   44 year old female former smoker presenting to our office today to receive a Fasenra injection.  This will be her first dose.  Patient tolerating injection well.  Patient is interested in being referred to gastroenterology due to persistent acid reflux and stomach pain.  We can coordinate this today.  Patient is also interested in being established with a primary care doctor within the [facility] system.  We can coordinate this today as well.  Pt also needs refills of her proair.   Questionaires / Pulmonary Flowsheets:   ACT:  Asthma Control Test ACT Total Score  10/09/2019 10    Tests:   10/17/2018 - CBC with Diff - eos 5, eos absolute 0.5  02/13/2019-IgE-680  02/13/2019-CBC with differential-Eosinophils relative 0.8, eosinophils absolute 0.1 - ? On prednisone  02/08/2019-chest x-ray-chronic bronchial thickening likely related asthma, no acute abnormality  10/09/2019-pulmonary function test-FVC 2.37 (62% predicted), postbronchodilator ratio 64, postbronchodilator FEV1 1.57 (50% predicted), no bronchodilator response, DLCO 22.61 (100% predicted)     Assessment & Plan:   Severe persistent asthma dependent on systemic steroids with acute exacerbation Patient received first Fasenra injection today Patient applied for patient assistance through [place] with the help of our office today  Plan: Continue Fasenra Continue daily prednisone as outlined Continue Symbicort 160 Keep close follow-up with our office   Gastroesophageal reflux  disease Persistent acid reflux symptoms Currently on over-the-counter PPI per patient Patient requesting referral to gastroenterology  Plan: We will refer to gastroenterology today  Obesity Plan: Referral to primary care provider at patient's request  Medication management Received Fasenra injection today  With the help of our office patient applied for patient assistance through [place] today     Lauraine Rinne, NP 10/13/2019   This appointment required 12 minutes of patient care (this includes precharting, chart review, review of results, face-to-face care, etc.).

## 2019-10-13 ENCOUNTER — Other Ambulatory Visit: Payer: Self-pay

## 2019-10-13 ENCOUNTER — Encounter: Payer: Self-pay | Admitting: Nurse Practitioner

## 2019-10-13 ENCOUNTER — Ambulatory Visit (INDEPENDENT_AMBULATORY_CARE_PROVIDER_SITE_OTHER): Payer: Medicaid Other | Admitting: Pulmonary Disease

## 2019-10-13 ENCOUNTER — Encounter: Payer: Self-pay | Admitting: Pulmonary Disease

## 2019-10-13 ENCOUNTER — Ambulatory Visit (INDEPENDENT_AMBULATORY_CARE_PROVIDER_SITE_OTHER): Payer: Self-pay

## 2019-10-13 DIAGNOSIS — Z79899 Other long term (current) drug therapy: Secondary | ICD-10-CM

## 2019-10-13 DIAGNOSIS — J4551 Severe persistent asthma with (acute) exacerbation: Secondary | ICD-10-CM

## 2019-10-13 DIAGNOSIS — K219 Gastro-esophageal reflux disease without esophagitis: Secondary | ICD-10-CM

## 2019-10-13 DIAGNOSIS — Z7952 Long term (current) use of systemic steroids: Secondary | ICD-10-CM

## 2019-10-13 DIAGNOSIS — Z6841 Body Mass Index (BMI) 40.0 and over, adult: Secondary | ICD-10-CM

## 2019-10-13 MED ORDER — ALBUTEROL SULFATE HFA 108 (90 BASE) MCG/ACT IN AERS: 1.0000 | INHALATION_SPRAY | Freq: Four times a day (QID) | RESPIRATORY_TRACT | 6 refills | Status: DC | PRN

## 2019-10-13 MED ORDER — BENRALIZUMAB 30 MG/ML ~~LOC~~ SOSY
30.0000 mg | PREFILLED_SYRINGE | Freq: Once | SUBCUTANEOUS | Status: AC
  Administered 2019-10-13: 30 mg via SUBCUTANEOUS

## 2019-10-13 NOTE — Assessment & Plan Note (Signed)
Plan: Referral to primary care provider at patient's request

## 2019-10-13 NOTE — Progress Notes (Signed)
Patient presented to the office today for first-time Fasenra injection.  Primary Pulmonologist: Wyn Quaker NP Medication name: Berna Bue Strength: 30mg   Site(s): R Arm  Epi pen/Auvi-Q visible during appointment: No - not required per Wyn Quaker, FNP  Time of injection: [phone]  Patient evaluated every 15-20 minutes per protocol x2 hours.  1st check: 1000 Evaluation: No Reaction  2nd check: 1015  Evaluation: No Reaction  3rd check: 1030  Evaluation: No Reaction  4th check: 1045   Evaluation: No Reaction  5th check: 1100  Evaluation: No Reaction  6th check: 1115  Evaluation: No Reaction  7th check: 1130  Evaluation: No Reaction  8th check: 1145  Evaluation: No Reaction

## 2019-10-13 NOTE — Assessment & Plan Note (Signed)
Patient received first Fasenra injection today Patient applied for patient assistance through [place] with the help of our office today  Plan: Continue Fasenra Continue daily prednisone as outlined Continue Symbicort 160 Keep close follow-up with our office

## 2019-10-13 NOTE — Assessment & Plan Note (Signed)
Received Fasenra injection today  With the help of our office patient applied for patient assistance through [place] today

## 2019-10-13 NOTE — Assessment & Plan Note (Signed)
Persistent acid reflux symptoms Currently on over-the-counter PPI per patient Patient requesting referral to gastroenterology  Plan: We will refer to gastroenterology today

## 2019-10-17 ENCOUNTER — Telehealth: Payer: Self-pay | Admitting: Pulmonary Disease

## 2019-10-17 NOTE — Telephone Encounter (Addendum)
Received a call from [employer], pt has approved for patient assistance through September 2021.  Berna Bue Order: 30mg  #1 prefilled syringe Ordered date: 10/17/2019 Expected date of arrival: 10/19/2019 Ordered by: Desmond Dike, [place]  Specialty Pharmacy: [facility]

## 2019-10-17 NOTE — Telephone Encounter (Signed)
See telephone encounter (08/01/2019)

## 2019-10-18 NOTE — Telephone Encounter (Signed)
Berna Bue Shipment Received:  30mg  #1 prefilled syringe Medication arrival date: 10/18/2019 Lot #: [id number] Exp date: 11/2020 Received by: Desmond Dike, [place]

## 2019-10-24 ENCOUNTER — Ambulatory Visit: Payer: Medicaid Other | Admitting: Nurse Practitioner

## 2019-10-31 ENCOUNTER — Encounter: Payer: Self-pay | Admitting: Pulmonary Disease

## 2019-10-31 ENCOUNTER — Ambulatory Visit: Payer: Medicaid Other | Admitting: Pulmonary Disease

## 2019-10-31 ENCOUNTER — Other Ambulatory Visit: Payer: Self-pay

## 2019-10-31 VITALS — BP 130/80 | HR 88 | Temp 98.0°F | Ht 65.0 in | Wt 307.2 lb

## 2019-10-31 DIAGNOSIS — Z7952 Long term (current) use of systemic steroids: Secondary | ICD-10-CM

## 2019-10-31 DIAGNOSIS — J4551 Severe persistent asthma with (acute) exacerbation: Secondary | ICD-10-CM

## 2019-10-31 MED ORDER — PREDNISONE 10 MG PO TABS: ORAL_TABLET | ORAL | 0 refills | Status: DC

## 2019-10-31 MED ORDER — METHYLPREDNISOLONE ACETATE 80 MG/ML IJ SUSP
120.0000 mg | Freq: Once | INTRAMUSCULAR | Status: AC
  Administered 2019-10-31: 120 mg via INTRAMUSCULAR

## 2019-10-31 NOTE — Progress Notes (Signed)
Subjective:   PATIENT ID: [name] GENDER: female DOB: March 10, 1976, MRN: [id number]   HPI  No chief complaint on file.   Reason for Visit: Follow-up biologic  [name] is a 44 year old female former smoker who presents for follow-up of severe persistent asthma with steroid dependence.   Synopsis: Since moving to [place] in 2002, has had symptoms of dyspnea, cough and wheezing. Triggered by exertion, heat and smoke. She has had frequent asthma exacerbations requiring steroid treatment nearly every month of 2019 and 2020 despite being compliant on steroid inhalers. She is steroid dependent. Has had multiple exacerbations in 2019 through 2020. She was previously on [facility] for 3 months however stopped taking due to oral rashes/rashes last month. Has tried Breo (6 months), Qvar (2 years well-controlled, recently on for 2 months and stopped due to ineffectiveness) and Symbicort (1 month discontinued due to cost).   She recently received her first Fasenra injection on 10/13/19 She is compliant with her Symbicort and Duonebs as needed  She is nearly done with with prednisone taper on 10 mg with <1 week left and does not feel like it is helping at this dose. Unfortunately, she did restart smoking but hasn't in the last week.  She continues to have dyspnea wheezing, wheezing and nocturnal coughing.  ACT: 9  Steroids Received in 2019 and 2020 2019 Jan Feb March April May June July Aug Sept Oct Nov Dec   X X XX XX  XX X XX XXX  XX XXX  2020 Jan Feb March April May June July Aug Sept Oct Nov Dec   X X [place]   XX  2021 Jan Feb March April May June July Aug Sept Oct Nov Dec    X Fasenra            Social History: Social smoker 31 pack-years, started at age 37. Quit in 8072 Her 12 year old daughter recently passed in a car accident in 10/2019  Environmental exposures:  Worked in [employer] in 2010, left job due to respiratory symptoms  I have personally  reviewed patient's past medical/family/social history/allergies/current medications.  Past Medical History:  Diagnosis Date  . Asthma 2008  . COPD (chronic obstructive pulmonary disease) ([place])   . GERD (gastroesophageal reflux disease)   . Multiple gastric ulcers   . Pneumonia      Outpatient Medications Prior to Visit  Medication Sig Dispense Refill  . acetaminophen (TYLENOL) 500 MG tablet Take 500-1,000 mg by mouth every 6 (six) hours as needed.    Marland Kitchen albuterol (VENTOLIN HFA) 108 (90 Base) MCG/ACT inhaler Inhale 1-2 puffs into the lungs every 6 (six) hours as needed for wheezing or shortness of breath. 8 g 6  . budesonide-formoterol (SYMBICORT) 160-4.5 MCG/ACT inhaler Inhale 2 puffs into the lungs 2 (two) times daily. 1 Inhaler 0  . ipratropium-albuterol (DUONEB) 0.5-2.5 (3) MG/3ML SOLN Take 3 mLs by nebulization every 6 (six) hours as needed. 360 mL 1  . loratadine (CLARITIN) 10 MG tablet Take 1 tablet (10 mg total) by mouth daily as needed. 90 tablet 0  . pantoprazole (PROTONIX) 20 MG tablet Take 20 mg by mouth daily.    . pantoprazole (PROTONIX) 40 MG tablet Take 1 tablet (40 mg total) by mouth daily as needed. 30 tablet 3  . predniSONE (DELTASONE) 10 MG tablet Take 2 tablets (20 mg total) by mouth daily with breakfast for 7 days, THEN 1 tablet (10  mg total) daily with breakfast for 21 days. 35 tablet 0  . promethazine (PHENERGAN) 25 MG tablet Take 1 tablet (25 mg total) by mouth every 8 (eight) hours as needed for nausea or vomiting. 20 tablet 0   No facility-administered medications prior to visit.   Review of Systems  Constitutional: Negative for chills, diaphoresis, fever, malaise/fatigue and weight loss.  HENT: Negative for congestion.   Respiratory: Negative for cough, hemoptysis, sputum production, shortness of breath and wheezing.   Cardiovascular: Negative for chest pain, palpitations and leg swelling.   Objective:   Vitals:   10/31/19 0900  BP: 130/80  Pulse: 88   Temp: 98 F (36.7 C)  TempSrc: Temporal  SpO2: 95%  Weight: (!) 307 lb 3.2 oz (139.3 kg)  Height: 5\' 5"  (1.651 m)     Physical Exam: General: Obese, well-appearing, no acute distress HENT: [place], AT Eyes: EOMI, no scleral icterus Respiratory: Clear to auscultation bilaterally.  No crackles, wheezing or rales Cardiovascular: RRR, -M/R/G, no JVD Extremities:-Edema,-tenderness Neuro: AAO x4, CNII-XII grossly intact Skin: Intact, no rashes or bruising Psych: Normal mood, normal affect  Data Reviewed:  Imaging: CXR 02/08/19 - Chronic bronchial thickening CT A/P 10/17/18 - RML scarring  PFT: 10/09/19 FVC 2.46 (64%) FEV1 1.57 (50%) Ratio 62  TLC 97% DLCO 100% Interpretation: Moderately severe obstructive defect. Reduced FVC with normal TLC suggests air trapping. Elevated RV and RV/TLC also suggests air trapping  Labs: SARS-Covid-2 01/17/2019- not detected 10/17/2018 WBC 11.2 with N 55% (6.2) L 32% (3.6) M 7% (0.8) and E 5% (0.5)  Absolute eosinophils 10/17/18 - 500 IgE 02/13/19 - 680  Imaging, labs and test noted above have been reviewed independently by me.    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 Started Berna Bue 10/13/19. Tolerating well but no changes in symptoms as of yet  Severe persistent allergic asthma with steroid dependence with exacerbation Give steroid shot today Continue Fasenra as scheduled Continue Symbicort 160-4.5 mcg TWO puffs TWICE daily Continue Duonebs as needed  Asthma Action Plan Increase Symbicort to 2 puffs three times a day for worsening shortness of breath, wheezing and cough. If you symptoms do not improve in 24-48 hours, start steroid pack that has been prescribed. Please call our office for evaluation and to let us know you steroid the steroid pack (60,50,40,30,20,10 pack).   Health Maintenance Immunization History  Administered  Date(s) Administered  . Influenza,inj,Quad PF,6+ Mos 05/04/2018   No orders of the defined types were placed in this encounter.  No orders of the defined types were placed in this encounter.   No follow-ups on file.  [place], MD [facility] 10/31/2019 8:22 AM  Office Number [phone]

## 2019-10-31 NOTE — Patient Instructions (Signed)
Severe persistent allergic asthma with steroid dependence Continue Fasenra as scheduled Continue Symbicort 160-4.5 mcg TWO puffs TWICE daily Continue Duonebs as needed  Asthma Action Plan Increase Symbicort to 2 puffs three times a day for worsening shortness of breath, wheezing and cough. If you symptoms do not improve in 24-48 hours, start steroid pack that has been prescribed. Please call our office for evaluation and to let us know you steroid the steroid pack (60,50,40,30,20,10 pack).  Follow-up in 3 months with Wyn Quaker, NP and then with me 6 months from now

## 2019-11-06 ENCOUNTER — Ambulatory Visit: Payer: Medicaid Other | Attending: Internal Medicine

## 2019-11-06 ENCOUNTER — Ambulatory Visit: Payer: Medicaid Other | Admitting: Physician Assistant

## 2019-11-06 ENCOUNTER — Encounter: Payer: Self-pay | Admitting: Physician Assistant

## 2019-11-06 ENCOUNTER — Other Ambulatory Visit: Payer: Self-pay

## 2019-11-06 VITALS — BP 118/82 | HR 85 | Temp 96.3°F

## 2019-11-06 DIAGNOSIS — J45909 Unspecified asthma, uncomplicated: Secondary | ICD-10-CM

## 2019-11-06 DIAGNOSIS — Z23 Encounter for immunization: Secondary | ICD-10-CM

## 2019-11-06 DIAGNOSIS — K219 Gastro-esophageal reflux disease without esophagitis: Secondary | ICD-10-CM

## 2019-11-06 MED ORDER — OMEPRAZOLE 40 MG PO CPDR: 40.0000 mg | DELAYED_RELEASE_CAPSULE | Freq: Every day | ORAL | 1 refills | Status: DC

## 2019-11-06 NOTE — Progress Notes (Signed)
BP 118/82   Pulse 85   Temp (!) 96.3 F (35.7 C)   SpO2 90%    Subjective:    Patient ID: Linda Ellis, female    DOB: 02/07/1976, 44 y.o.   MRN: [id number]  HPI: Linda Ellis is a 44 y.o. female presenting on 11/06/2019 for No chief complaint on file.   HPI   Pt had a negative covid 19 screening questionnaire.   Pt is 58yoF with asthma.  She says she smoked about 2 wk ago.  She Sees pulmonology for her asthma.  She is not taking her ppi    Relevant past medical, surgical, family and social history reviewed and updated as indicated. Interim medical history since our last visit reviewed. Allergies and medications reviewed and updated.   Current Outpatient Medications:  .  acetaminophen (TYLENOL) 500 MG tablet, Take 500-1,000 mg by mouth every 6 (six) hours as needed., Disp: , Rfl:  .  albuterol (VENTOLIN HFA) 108 (90 Base) MCG/ACT inhaler, Inhale 1-2 puffs into the lungs every 6 (six) hours as needed for wheezing or shortness of breath., Disp: 8 g, Rfl: 6 .  alum & mag hydroxide-simeth (MAALOX/MYLANTA) 200-200-20 MG/5ML suspension, Take by mouth every 6 (six) hours as needed for indigestion or heartburn., Disp: , Rfl:  .  Benralizumab (FASENRA [place]), Inject into the skin., Disp: , Rfl:  .  budesonide-formoterol (SYMBICORT) 160-4.5 MCG/ACT inhaler, Inhale 2 puffs into the lungs 2 (two) times daily., Disp: 1 Inhaler, Rfl: 0 .  Famotidine (PEPCID PO), Take by mouth., Disp: , Rfl:  .  ipratropium-albuterol (DUONEB) 0.5-2.5 (3) MG/3ML SOLN, Take 3 mLs by nebulization every 6 (six) hours as needed., Disp: 360 mL, Rfl: 1 .  loratadine (CLARITIN) 10 MG tablet, Take 1 tablet (10 mg total) by mouth daily as needed., Disp: 90 tablet, Rfl: 0 .  promethazine (PHENERGAN) 25 MG tablet, Take 1 tablet (25 mg total) by mouth every 8 (eight) hours as needed for nausea or vomiting., Disp: 20 tablet, Rfl: 0 .  pantoprazole (PROTONIX) 20 MG tablet, Take 20 mg by mouth daily., Disp: , Rfl:  .   pantoprazole (PROTONIX) 40 MG tablet, Take 1 tablet (40 mg total) by mouth daily as needed. (Patient not taking: Reported on 11/06/2019), Disp: 30 tablet, Rfl: 3 .  predniSONE (DELTASONE) 10 MG tablet, Take 6 tablets x three days (60 mg), followed by 5 tablets x three days (50mg ), then 4 tablets x three day(40mg ), then 3 tablets (30mg ) x three days, then 2 tablets (20mg ) x three days, then 1 tablet (10mg ) x three days, then STOP (Patient not taking: Reported on 11/06/2019), Disp: 60 tablet, Rfl: 0    Review of Systems  Per HPI unless specifically indicated above     Objective:    BP 118/82   Pulse 85   Temp (!) 96.3 F (35.7 C)   SpO2 90%   Wt Readings from Last 3 Encounters:  10/31/19 (!) 307 lb 3.2 oz (139.3 kg)  10/09/19 299 lb (135.6 kg)  03/02/19 (!) 328 lb (148.8 kg)    Physical Exam Vitals reviewed.  Constitutional:      General: She is not in acute distress.    Appearance: She is well-developed. She is not ill-appearing.  HENT:     Head: Normocephalic and atraumatic.  Cardiovascular:     Rate and Rhythm: Normal rate and regular rhythm.  Pulmonary:     Effort: Pulmonary effort is normal.     Breath sounds: Normal  breath sounds.  Abdominal:     General: Bowel sounds are normal.     Palpations: Abdomen is soft. There is no mass.     Tenderness: There is no abdominal tenderness.  Musculoskeletal:     Cervical back: Neck supple.     Right lower leg: No edema.     Left lower leg: No edema.  Lymphadenopathy:     Cervical: No cervical adenopathy.  Skin:    General: Skin is warm and dry.  Neurological:     Mental Status: She is alert and oriented to person, place, and time.  Psychiatric:        Attention and Perception: Attention normal.        Speech: Speech normal.        Behavior: Behavior normal. Behavior is cooperative.            Assessment & Plan:    Encounter Diagnoses  Name Primary?  . Gastroesophageal reflux disease, unspecified whether  esophagitis present Yes  . Asthma, unspecified asthma severity, unspecified whether complicated, unspecified whether persistent   . Morbid obesity ([place])       -Discussed with pt need to take her PPI.  rx omeprazole sent to [facility] (due to cost of protonix).   -Pt counseled on avoiding gas producing foods -Pt has appt for covid vaccination today -pt to follow up 3 wk to recheck GERD.  She is to contact office sooner for worsening or new symptoms

## 2019-11-06 NOTE — Progress Notes (Signed)
   Covid-19 Vaccination Clinic  Name:  Linda Ellis    MRN: [id number] DOB: 11-22-1975  11/06/2019  [name] was observed post Covid-19 immunization for 15 minutes without incident. She was provided with Vaccine Information Sheet and instruction to access the V-Safe system.   [name] was instructed to call 911 with any severe reactions post vaccine: Marland Kitchen Difficulty breathing  . Swelling of face and throat  . A fast heartbeat  . A bad rash all over body  . Dizziness and weakness   Immunizations Administered    Name Date Dose VIS Date Route   Pfizer COVID-19 Vaccine 11/06/2019  1:19 PM 0.3 mL 07/14/2019 Intramuscular   Manufacturer: [place]   Lot: [id number]   [place]: [id number]

## 2019-11-06 NOTE — Patient Instructions (Signed)
Abdominal Bloating When you have abdominal bloating, your abdomen may feel full, tight, or painful. It may also look bigger than normal or swollen (distended). Common causes of abdominal bloating include:  Swallowing air.  Constipation.  Problems digesting food.  Eating too much.  Irritable bowel syndrome. This is a condition that affects the large intestine.  Lactose intolerance. This is an inability to digest lactose, a natural sugar in dairy products.  Celiac disease. This is a condition that affects the ability to digest gluten, a protein found in some grains.  Gastroparesis. This is a condition that slows down the movement of food in the stomach and small intestine. It is more common in people with diabetes mellitus.  Gastroesophageal reflux disease (GERD). This is a digestive condition that makes stomach acid flow back into the esophagus.  Urinary retention. This means that the body is holding onto urine, and the bladder cannot be emptied all the way. Follow these instructions at home: Eating and drinking  Avoid eating too much.  Try not to swallow air while talking or eating.  Avoid eating while lying down.  Avoid these foods and drinks: ? Foods that cause gas, such as broccoli, cabbage, cauliflower, and baked beans. ? Carbonated drinks. ? Hard candy. ? Chewing gum. Medicines  Take over-the-counter and prescription medicines only as told by your health care provider.  Take probiotic medicines. These medicines contain live bacteria or yeasts that can help digestion.  Take coated peppermint oil capsules. Activity  Try to exercise regularly. Exercise may help to relieve bloating that is caused by gas and relieve constipation. General instructions  Keep all follow-up visits as told by your health care provider. This is important. Contact a health care provider if:  You have nausea and vomiting.  You have diarrhea.  You have abdominal pain.  You have unusual  weight loss or weight gain.  You have severe pain, and medicines do not help. Get help right away if:  You have severe chest pain.  You have trouble breathing.  You have shortness of breath.  You have trouble urinating.  You have darker urine than normal.  You have blood in your stools or have dark, tarry stools. Summary  Abdominal bloating means that the abdomen is swollen.  Common causes of abdominal bloating are swallowing air, constipation, and problems digesting food.  Avoid eating too much and avoid swallowing air.  Avoid foods that cause gas, carbonated drinks, hard candy, and chewing gum. This information is not intended to replace advice given to you by your health care provider. Make sure you discuss any questions you have with your health care provider. Document Revised: 11/07/2018 Document Reviewed: 08/21/2016 Elsevier Patient Education  2020 Elsevier Inc.  

## 2019-11-10 ENCOUNTER — Ambulatory Visit (INDEPENDENT_AMBULATORY_CARE_PROVIDER_SITE_OTHER): Payer: Self-pay

## 2019-11-10 ENCOUNTER — Ambulatory Visit: Payer: Medicaid Other

## 2019-11-10 ENCOUNTER — Other Ambulatory Visit: Payer: Self-pay

## 2019-11-10 DIAGNOSIS — J4551 Severe persistent asthma with (acute) exacerbation: Secondary | ICD-10-CM

## 2019-11-10 DIAGNOSIS — Z7952 Long term (current) use of systemic steroids: Secondary | ICD-10-CM

## 2019-11-10 MED ORDER — BENRALIZUMAB 30 MG/ML ~~LOC~~ SOSY
30.0000 mg | PREFILLED_SYRINGE | Freq: Once | SUBCUTANEOUS | Status: AC
  Administered 2019-11-10: 30 mg via SUBCUTANEOUS

## 2019-11-10 MED ORDER — BENRALIZUMAB 30 MG/ML ~~LOC~~ SOSY: 30.0000 mg | PREFILLED_SYRINGE | Freq: Once | SUBCUTANEOUS | Status: DC

## 2019-11-10 NOTE — Progress Notes (Signed)
Have you been hospitalized within the last 10 days?  No Do you have a fever?  No Do you have a cough?  No Do you have a headache or sore throat? No Epi Pen not required per Brian,NP- per last injection note. Epi pen available in injection room, if needed. Patient denies any problems, side, or adverse effects.

## 2019-11-15 ENCOUNTER — Other Ambulatory Visit: Payer: Self-pay | Admitting: Physician Assistant

## 2019-11-15 ENCOUNTER — Telehealth: Payer: Self-pay | Admitting: Pulmonary Disease

## 2019-11-15 DIAGNOSIS — J4551 Severe persistent asthma with (acute) exacerbation: Secondary | ICD-10-CM

## 2019-11-15 MED ORDER — ALBUTEROL SULFATE HFA 108 (90 BASE) MCG/ACT IN AERS: 1.0000 | INHALATION_SPRAY | Freq: Four times a day (QID) | RESPIRATORY_TRACT | 0 refills | Status: DC | PRN

## 2019-11-15 NOTE — Telephone Encounter (Signed)
Pt is currently receiving Fasenra q8w at our office. We are starting to transition our patients to self administer at home. Please advise if you believe pt would be a good candidate for this. Thanks.

## 2019-11-15 NOTE — Telephone Encounter (Signed)
11/15/2019  I would like for the patient to continue receiving Fasenra.  If they feel comfortable with administering the self injection then yes I am okay with this.  I would prefer that the patient be able to bring the self injector and and demonstrate competence prior to patient receiving medication at home and administering herself.  Wyn Quaker, FNP

## 2019-11-15 NOTE — Telephone Encounter (Signed)
Please disregard message

## 2019-11-23 ENCOUNTER — Telehealth: Payer: Self-pay | Admitting: Pulmonary Disease

## 2019-11-23 NOTE — Telephone Encounter (Signed)
Spoke with pt, she wanted to let Dr. Loanne Drilling know she started taking the prednisone. She states her breathing got bad with SOB. FYI Dr. Loanne Drilling    Patient Instructions by Margaretha Seeds, MD at 10/31/2019 9:00 AM Author: Margaretha Seeds, MD Author Type: Physician Filed: 10/31/2019 9:24 AM  Note Status: Signed Cosign: Cosign Not Required Encounter Date: 10/31/2019  Editor: Margaretha Seeds, MD (Physician)    Severe persistentallergicasthma with steroid dependence Continue Berna Bue as scheduled Continue Symbicort 160-4.5 mcg TWO puffs TWICE daily Continue Duonebs as needed  Asthma Action Plan Increase Symbicort to 2 puffs three times a day for worsening shortness of breath, wheezing and cough. If you symptoms do not improve in 24-48 hours, start steroid pack that has been prescribed. Please call our office for evaluation and to let us know you steroid the steroid pack (60,50,40,30,20,10 pack).  Follow-up in 3 months with Wyn Quaker, NP and then with me 6 months from now

## 2019-11-27 ENCOUNTER — Telehealth: Payer: Self-pay | Admitting: Pulmonary Disease

## 2019-11-27 ENCOUNTER — Encounter: Payer: Self-pay | Admitting: Physician Assistant

## 2019-11-27 ENCOUNTER — Ambulatory Visit: Payer: Medicaid Other | Admitting: Physician Assistant

## 2019-11-27 DIAGNOSIS — K219 Gastro-esophageal reflux disease without esophagitis: Secondary | ICD-10-CM

## 2019-11-27 DIAGNOSIS — J45909 Unspecified asthma, uncomplicated: Secondary | ICD-10-CM

## 2019-11-27 NOTE — Telephone Encounter (Signed)
Berna Bue Order: 30mg  #1 prefilled syringe Ordered date: 11/27/2019 Expected date of arrival: 2-3 business days Ordered by: Desmond Dike, [place]  Specialty Pharmacy: [facility]

## 2019-11-27 NOTE — Progress Notes (Signed)
There were no vitals taken for this visit.   Subjective:    Patient ID: Linda Ellis, female    DOB: [birth year], 44 y.o.   MRN: [id number]  HPI: Linda Ellis is a 43 y.o. female presenting on 11/27/2019 for No chief complaint on file.   HPI   This is a telemedicine appointment through [employer] due to coronavirus pandemic.  I connected with  Linda Ellis on 11/27/19 by a video enabled telemedicine application and verified that I am speaking with the correct person using two identifiers.   I discussed the limitations of evaluation and management by telemedicine. The patient expressed understanding and agreed to proceed.  Pt is at home.  Provider is at office.     She is using omeprazole which is helping her GERD pains.   Pt is not feeling well- asthma flare- she has discussed with Dr Cordelia Pen office.      Relevant past medical, surgical, family and social history reviewed and updated as indicated. Interim medical history since our last visit reviewed. Allergies and medications reviewed and updated.   Current Outpatient Medications:  .  acetaminophen (TYLENOL) 500 MG tablet, Take 500-1,000 mg by mouth every 6 (six) hours as needed., Disp: , Rfl:  .  albuterol (VENTOLIN HFA) 108 (90 Base) MCG/ACT inhaler, Inhale 1-2 puffs into the lungs every 6 (six) hours as needed for wheezing or shortness of breath., Disp: 8 g, Rfl: 0 .  alum & mag hydroxide-simeth (MAALOX/MYLANTA) 200-200-20 MG/5ML suspension, Take by mouth every 6 (six) hours as needed for indigestion or heartburn., Disp: , Rfl:  .  Benralizumab (FASENRA [place]), Inject into the skin., Disp: , Rfl:  .  budesonide-formoterol (SYMBICORT) 160-4.5 MCG/ACT inhaler, Inhale 2 puffs into the lungs 2 (two) times daily., Disp: 1 Inhaler, Rfl: 0 .  Famotidine (PEPCID PO), Take by mouth., Disp: , Rfl:  .  ipratropium-albuterol (DUONEB) 0.5-2.5 (3) MG/3ML SOLN, Take 3 mLs by nebulization every 6 (six) hours as needed., Disp: 360 mL, Rfl:  1 .  loratadine (CLARITIN) 10 MG tablet, Take 1 tablet (10 mg total) by mouth daily as needed., Disp: 90 tablet, Rfl: 0 .  omeprazole (PRILOSEC) 40 MG capsule, Take 1 capsule (40 mg total) by mouth daily., Disp: 90 capsule, Rfl: 1 .  pantoprazole (PROTONIX) 20 MG tablet, Take 20 mg by mouth daily., Disp: , Rfl:  .  pantoprazole (PROTONIX) 40 MG tablet, Take 1 tablet (40 mg total) by mouth daily as needed. (Patient not taking: Reported on 11/06/2019), Disp: 30 tablet, Rfl: 3 .  predniSONE (DELTASONE) 10 MG tablet, Take 6 tablets x three days (60 mg), followed by 5 tablets x three days (50mg ), then 4 tablets x three day(40mg ), then 3 tablets (30mg ) x three days, then 2 tablets (20mg ) x three days, then 1 tablet (10mg ) x three days, then STOP (Patient not taking: Reported on 11/06/2019), Disp: 60 tablet, Rfl: 0 .  promethazine (PHENERGAN) 25 MG tablet, Take 1 tablet (25 mg total) by mouth every 8 (eight) hours as needed for nausea or vomiting., Disp: 20 tablet, Rfl: 0    Review of Systems  Per HPI unless specifically indicated above     Objective:    There were no vitals taken for this visit.  Wt Readings from Last 3 Encounters:  10/31/19 (!) 307 lb 3.2 oz (139.3 kg)  10/09/19 299 lb (135.6 kg)  03/02/19 (!) 328 lb (148.8 kg)    Physical Exam Constitutional:  General: She is not in acute distress.    Appearance: She is not toxic-appearing.  HENT:     Head: Normocephalic and atraumatic.  Pulmonary:     Effort: No respiratory distress.  Neurological:     Mental Status: She is alert and oriented to person, place, and time.  Psychiatric:        Attention and Perception: Attention normal.        Speech: Speech normal.        Behavior: Behavior is cooperative.           Assessment & Plan:   Encounter Diagnoses  Name Primary?  . Gastroesophageal reflux disease, unspecified whether esophagitis present Yes  . Asthma, unspecified asthma severity, unspecified whether complicated,  unspecified whether persistent        -continue omeprazole for GERD -continue with dr Loanne Drilling for asthma -follow up here 2 months.  Contact office sooner for any worsening or new symptoms.

## 2019-11-28 NOTE — Telephone Encounter (Signed)
Called and spoke with pt in regards to message from Dr. Loanne Drilling. Pt has been scheduled for a visit with Aaron Edelman 4/29 at 11am.  Nothing further needed.

## 2019-11-28 NOTE — Telephone Encounter (Signed)
[facility] Telephone Call Please schedule patient to be seen by NP for acute visit tomorrow or before the end of this week for asthma exacerbation.   Patient started prednisone pack on 4/18 with minimal relief. She likely needs steroid injection. This is the worst time of the year for her flare-ups. She is currently on Fasenra (s/p two injections). I would advise against starting home injections at this time given the difficulty to control her asthma symptoms.   Plan Staff - schedule with NP for visit for steroid injection and possibly restarting steroid taper pending evaluation.  Pharmacy team - please hold off on home teaching of Berna Bue until patient symptoms stable  Rodman Pickle, M.D. [facility] Medicine 11/28/2019 3:35 PM

## 2019-11-29 ENCOUNTER — Ambulatory Visit: Payer: Self-pay

## 2019-11-29 NOTE — Progress Notes (Signed)
@Patient  ID: Linda Ellis, female    DOB: [birth year], 44 y.o.   MRN: [id number]  Chief Complaint  Patient presents with  . Follow-up    Started prednisone taper on 4/16 for asthma exacerbation. Still having issues with SOB and wheezing.     Referring provider: [name], PA-C  HPI:  44 year old former smoker followed in our office for severe persistent asthma  Past medical history: Morbid obesity, GERD Smoking history: Former smoker.  Quit July/2020.  1 pack/day.  31-pack-year smoking history Maintenance: Symbicort 160 TID, Fasenra  Patient of Dr. 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  Patient has a history of frequent exacerbations.  She was last seen about a month ago in our office for she was treated with a Depo-Medrol injection as well as given a prednisone taper for her to start if her symptoms did not improve.  Patient reports that her symptoms did improve for about 7 to 10 days after the steroid injection as well as increasing Symbicort 160 to 3 times daily.  Unfortunately around 11/17/2019 patient had to start prednisone taper.  Patient scheduled follow-up with our office.  She continues to be maintained on Fasenra which she was recently started on.  Her next shot is scheduled for 12/08/2019.  Patient reports that she has been having difficulty sleeping due to cough as well as using her duo nebs about every 8 hours.  She feels that her shortness of breath does not improve despite the prednisone taper.  She had previously talked with Dr. Loanne Drilling earlier this week who felt the patient may benefit from another steroid shot.  We will discuss this today.  Questionaires / Pulmonary Flowsheets:   ACT:  Asthma Control Test ACT Total Score  11/30/2019 6  10/31/2019 9  10/09/2019 10   Tests:   10/17/2018 - CBC with Diff - eos 5, eos absolute 0.5  02/13/2019-IgE-680  02/13/2019-CBC with  differential-Eosinophils relative 0.8, eosinophils absolute 0.1 - ? On prednisone  02/08/2019-chest x-ray-chronic bronchial thickening likely related asthma, no acute abnormality  10/09/2019-pulmonary function test-FVC 2.37 (62% predicted), postbronchodilator ratio 64, postbronchodilator FEV1 1.57 (50% predicted), no bronchodilator response, DLCO 22.61 (100% predicted)   FENO:  No results found for: NITRICOXIDE  PFT: PFT Results Latest Ref Rng & Units 10/09/2019  FVC-Pre L 2.37  FVC-Predicted Pre % 62  FVC-Post L 2.46  FVC-Predicted Post % 64  Pre FEV1/FVC % % 62  Post FEV1/FCV % % 64  FEV1-Pre L 1.48  FEV1-Predicted Pre % 47  FEV1-Post L 1.57  DLCO UNC% % 100  DLCO COR %Predicted % 135  TLC L 5.08  TLC % Predicted % 97  RV % Predicted % 170    WALK:  No flowsheet data found.  Imaging: No results found.  Lab Results:  CBC    Component Value Date/Time   WBC 15.3 (H) 02/13/2019 1036   RBC 4.38 02/13/2019 1036   HGB 13.6 02/13/2019 1036   HCT 40.4 02/13/2019 1036   PLT 419.0 (H) 02/13/2019 1036   MCV 92.2 02/13/2019 1036   MCH 29.7 10/17/2018 1234   MCHC 33.5 02/13/2019 1036   RDW 14.0 02/13/2019 1036   LYMPHSABS 5.9 (H) 02/13/2019 1036   MONOABS 0.9 02/13/2019 1036   EOSABS 0.1 02/13/2019 1036   BASOSABS 0.0 02/13/2019 1036    BMET    Component Value Date/Time   NA 139 08/23/2019 1125  K 3.8 08/23/2019 1125   CL 102 08/23/2019 1125   CO2 26 08/23/2019 1125   GLUCOSE 93 08/23/2019 1125   BUN 8 08/23/2019 1125   CREATININE 0.73 08/23/2019 1125   CALCIUM 9.0 08/23/2019 1125   GFRNONAA >60 08/23/2019 1125   GFRAA >60 08/23/2019 1125    BNP    Component Value Date/Time   BNP 23.0 04/09/2018 0118    ProBNP No results found for: PROBNP  Specialty Problems      Pulmonary Problems   Severe persistent asthma dependent on systemic steroids with acute exacerbation    10/17/2018 - CBC with Diff - eos 5, eos absolute  0.5  02/13/2019-IgE-680  02/13/2019-CBC with differential-Eosinophils relative 0.8, eosinophils absolute 0.1 - ? On prednisone  02/08/2019-chest x-ray-chronic bronchial thickening likely related asthma, no acute abnormality  10/09/2019-pulmonary function test-FVC 2.37 (62% predicted), postbronchodilator ratio 64, postbronchodilator FEV1 1.57 (50% predicted), no bronchodilator response, DLCO 22.61 (100% predicted)          No Known Allergies  Immunization History  Administered Date(s) Administered  . Influenza,inj,Quad PF,6+ Mos 05/04/2018  . PFIZER SARS-COV-2 Vaccination 11/06/2019    Past Medical History:  Diagnosis Date  . Asthma 2008  . COPD (chronic obstructive pulmonary disease) ([place])   . GERD (gastroesophageal reflux disease)   . Multiple gastric ulcers   . Pneumonia     Tobacco History: Social History   Tobacco Use  Smoking Status Former Smoker  . Packs/day: 1.00  . Years: 31.00  . Pack years: 31.00  . Types: Cigarettes  . Start date: 30  . Quit date: 02/24/2019  . Years since quitting: 0.7  Smokeless Tobacco Never Used   Counseling given: Yes   Continue to not smoke  Outpatient Encounter Medications as of 11/30/2019  Medication Sig  . acetaminophen (TYLENOL) 500 MG tablet Take 500-1,000 mg by mouth every 6 (six) hours as needed.  Marland Kitchen albuterol (VENTOLIN HFA) 108 (90 Base) MCG/ACT inhaler Inhale 1-2 puffs into the lungs every 6 (six) hours as needed for wheezing or shortness of breath.  Marland Kitchen alum & mag hydroxide-simeth (MAALOX/MYLANTA) 200-200-20 MG/5ML suspension Take by mouth every 6 (six) hours as needed for indigestion or heartburn.  Marland Kitchen Benralizumab (FASENRA [place]) Inject into the skin.  . budesonide-formoterol (SYMBICORT) 160-4.5 MCG/ACT inhaler Inhale 2 puffs into the lungs 2 (two) times daily.  . Famotidine (PEPCID PO) Take by mouth.  Marland Kitchen ipratropium-albuterol (DUONEB) 0.5-2.5 (3) MG/3ML SOLN Take 3 mLs by nebulization every 6 (six) hours as needed.  .  loratadine (CLARITIN) 10 MG tablet Take 1 tablet (10 mg total) by mouth daily as needed.  Marland Kitchen omeprazole (PRILOSEC) 40 MG capsule Take 1 capsule (40 mg total) by mouth daily.  . pantoprazole (PROTONIX) 20 MG tablet Take 20 mg by mouth daily.  . predniSONE (DELTASONE) 10 MG tablet Take 6 tablets x three days (60 mg), followed by 5 tablets x three days (50mg ), then 4 tablets x three day(40mg ), then 3 tablets (30mg ) x three days, then 2 tablets (20mg ) x three days, then 1 tablet (10mg ) x three days, then STOP  . promethazine (PHENERGAN) 25 MG tablet Take 1 tablet (25 mg total) by mouth every 8 (eight) hours as needed for nausea or vomiting.  . chlorpheniramine-HYDROcodone (TUSSIONEX PENNKINETIC ER) 10-8 MG/5ML SUER Take 5 mLs by mouth at bedtime as needed for cough.  . montelukast (SINGULAIR) 10 MG tablet Take 1 tablet (10 mg total) by mouth at bedtime.  . predniSONE (DELTASONE) 10 MG tablet Take 2  tablets (20mg  total) daily for the next 5 days. Take in the AM with food.  . [DISCONTINUED] ALBUTEROL IN Inhale 2 puffs into the lungs as needed. For shortness of breath   . [DISCONTINUED] pantoprazole (PROTONIX) 40 MG tablet Take 1 tablet (40 mg total) by mouth daily as needed. (Patient not taking: Reported on 11/06/2019)  . [EXPIRED] methylPREDNISolone acetate (DEPO-MEDROL) injection 80 mg    No facility-administered encounter medications on file as of 11/30/2019.     Review of Systems  Review of Systems  Constitutional: Positive for fatigue. Negative for activity change and fever.  HENT: Positive for congestion. Negative for sinus pressure, sinus pain and sore throat.   Respiratory: Positive for cough, shortness of breath and wheezing.   Cardiovascular: Negative for chest pain and palpitations.  Gastrointestinal: Negative for diarrhea, nausea and vomiting.  Musculoskeletal: Negative for arthralgias.  Neurological: Negative for dizziness.  Psychiatric/Behavioral: Positive for sleep disturbance. The  patient is not nervous/anxious.      Physical Exam  BP 116/72   Pulse 86   Temp 98.4 F (36.9 C) (Temporal)   Ht 5\' 5"  (1.651 m)   Wt (!) 310 lb (140.6 kg)   SpO2 96% Comment: on RA  BMI 51.59 kg/m   Wt Readings from Last 5 Encounters:  11/30/19 (!) 310 lb (140.6 kg)  10/31/19 (!) 307 lb 3.2 oz (139.3 kg)  10/09/19 299 lb (135.6 kg)  03/02/19 (!) 328 lb (148.8 kg)  02/13/19 (!) 328 lb 6.4 oz (149 kg)    BMI Readings from Last 5 Encounters:  11/30/19 51.59 kg/m  10/31/19 51.12 kg/m  10/09/19 49.76 kg/m  03/02/19 54.58 kg/m  02/13/19 56.37 kg/m     Physical Exam Vitals and nursing note reviewed.  Constitutional:      General: She is not in acute distress.    Appearance: Normal appearance. She is obese.  HENT:     Head: Normocephalic and atraumatic.     Right Ear: External ear normal.     Left Ear: External ear normal.     Nose: Congestion and rhinorrhea present.     Mouth/Throat:     Mouth: Mucous membranes are moist.     Pharynx: Oropharynx is clear.  Eyes:     Pupils: Pupils are equal, round, and reactive to light.  Cardiovascular:     Rate and Rhythm: Normal rate and regular rhythm.     Pulses: Normal pulses.     Heart sounds: Normal heart sounds. No murmur.  Pulmonary:     Effort: Pulmonary effort is normal. No respiratory distress.     Breath sounds: No decreased air movement. Wheezing (Inspiratory and expiratory) present. No decreased breath sounds or rales.  Musculoskeletal:     Cervical back: Normal range of motion.  Skin:    General: Skin is warm and dry.     Capillary Refill: Capillary refill takes less than 2 seconds.  Neurological:     General: No focal deficit present.     Mental Status: She is alert and oriented to person, place, and time. Mental status is at baseline.     Gait: Gait normal.  Psychiatric:        Mood and Affect: Mood normal.        Behavior: Behavior normal.        Thought Content: Thought content normal.         Judgment: Judgment normal.       Assessment & Plan:   Severe persistent asthma dependent  on systemic steroids with acute exacerbation Persistent inspiratory and expiratory wheezes Status post prednisone taper Status post Depo-Medrol injection in March/2021 Maintained on Symbicort 160 3 times daily Recent start of Fasenra on next scheduled dose on 12/08/2019 BMI 51.5 Inspiratory and expiratory wheezes on exam today Act score worsened  Plan: Depo shot today X-ray today Continue Fasenra Continue Symbicort 3 times daily Continue prednisone 20 mg daily Start Singulair Continue daily antihistamine Continue nasal saline rinses Follow-up in 1 week    Medication management Recent Fasenra start Patient received Fasenra injection mailed to her due to insurance coverage Next scheduled Fasenra injection in our office is 12/08/2019  Obesity BMI 51.5 This is a component to the patient's difficulty with her breathing as well as likely component of the eosinophilia  Plan: 1 patient's breathing is stable we will encourage increasing overall physical activity as well as changing dietary habits to reduce BMI.  A weight loss goal of 10% could be the equivalent of adding an inhaler to help with her breathing    Return in about 8 days (around 12/08/2019), or if symptoms worsen or fail to improve, for Follow up with Wyn Quaker FNP-C.   Lauraine Rinne, NP 11/30/2019   This appointment required 42 minutes of patient care (this includes precharting, chart review, review of results, face-to-face care, etc.).

## 2019-11-30 ENCOUNTER — Ambulatory Visit: Payer: Medicaid Other | Admitting: Pulmonary Disease

## 2019-11-30 ENCOUNTER — Other Ambulatory Visit: Payer: Self-pay

## 2019-11-30 ENCOUNTER — Ambulatory Visit (INDEPENDENT_AMBULATORY_CARE_PROVIDER_SITE_OTHER)
Admission: RE | Admit: 2019-11-30 | Discharge: 2019-11-30 | Disposition: A | Discharge status: Home / Self Care | Payer: Medicaid Other | Source: Ambulatory Visit | Attending: Pulmonary Disease | Admitting: Pulmonary Disease

## 2019-11-30 ENCOUNTER — Encounter: Payer: Self-pay | Admitting: Pulmonary Disease

## 2019-11-30 VITALS — BP 116/72 | HR 86 | Temp 98.4°F | Ht 65.0 in | Wt 310.0 lb

## 2019-11-30 DIAGNOSIS — J4551 Severe persistent asthma with (acute) exacerbation: Secondary | ICD-10-CM

## 2019-11-30 DIAGNOSIS — Z7952 Long term (current) use of systemic steroids: Secondary | ICD-10-CM

## 2019-11-30 DIAGNOSIS — Z79899 Other long term (current) drug therapy: Secondary | ICD-10-CM

## 2019-11-30 DIAGNOSIS — Z6841 Body Mass Index (BMI) 40.0 and over, adult: Secondary | ICD-10-CM

## 2019-11-30 MED ORDER — PREDNISONE 10 MG PO TABS: ORAL_TABLET | ORAL | 0 refills | Status: DC

## 2019-11-30 MED ORDER — METHYLPREDNISOLONE ACETATE 80 MG/ML IJ SUSP
80.0000 mg | Freq: Once | INTRAMUSCULAR | Status: AC
  Administered 2019-11-30: 12:00:00 80 mg via INTRAMUSCULAR

## 2019-11-30 MED ORDER — MONTELUKAST SODIUM 10 MG PO TABS: 10.0000 mg | ORAL_TABLET | Freq: Every day | ORAL | 11 refills | Status: DC

## 2019-11-30 MED ORDER — HYDROCOD POLST-CPM POLST ER 10-8 MG/5ML PO SUER: 5.0000 mL | Freq: Every evening | ORAL | 0 refills | Status: DC | PRN

## 2019-11-30 NOTE — Telephone Encounter (Signed)
Pt came in for an appointment today with Aaron Edelman. Medication was shipped to the pt instead of our office. She has been instructed to NOT administer the medication to herself. Pt will bring the medication with her to appointment next week.

## 2019-11-30 NOTE — Assessment & Plan Note (Signed)
Recent Fasenra start Patient received Fasenra injection mailed to her due to insurance coverage Next scheduled Fasenra injection in our office is 12/08/2019

## 2019-11-30 NOTE — Assessment & Plan Note (Signed)
Persistent inspiratory and expiratory wheezes Status post prednisone taper Status post Depo-Medrol injection in March/2021 Maintained on Symbicort 160 3 times daily Recent start of Fasenra on next scheduled dose on 12/08/2019 BMI 51.5 Inspiratory and expiratory wheezes on exam today Act score worsened  Plan: Depo shot today X-ray today Continue Fasenra Continue Symbicort 3 times daily Continue prednisone 20 mg daily Start Singulair Continue daily antihistamine Continue nasal saline rinses Follow-up in 1 week

## 2019-11-30 NOTE — Assessment & Plan Note (Signed)
BMI 51.5 This is a component to the patient's difficulty with her breathing as well as likely component of the eosinophilia  Plan: 1 patient's breathing is stable we will encourage increasing overall physical activity as well as changing dietary habits to reduce BMI.  A weight loss goal of 10% could be the equivalent of adding an inhaler to help with her breathing

## 2019-11-30 NOTE — Patient Instructions (Addendum)
You were seen today by Lauraine Rinne, NP  for:   1. Severe persistent asthma dependent on systemic steroids with acute exacerbation  - montelukast (SINGULAIR) 10 MG tablet; Take 1 tablet (10 mg total) by mouth at bedtime.  Dispense: 30 tablet; Refill: 11 - chlorpheniramine-HYDROcodone (TUSSIONEX PENNKINETIC ER) 10-8 MG/5ML SUER; Take 5 mLs by mouth at bedtime as needed for cough.  Dispense: 140 mL; Refill: 0 - DG Chest 2 View; Future  Depo 80 today  Continue Symbicort 160 >>> 2 puffs in the morning right when you wake up, rinse out your mouth after use, 8 hours later 2 puffs, rinse after use, 8 hours later 2 puffs, rinse after use  >>> Take this daily, no matter what >>> This is not a rescue inhaler   Continue 20 mg of prednisone daily  Only use your albuterol as a rescue medication to be used if you can't catch your breath by resting or doing a relaxed purse lip breathing pattern.  - The less you use it, the better it will work when you need it. - Ok to use up to 2 puffs  every 4 hours if you must but call for immediate appointment if use goes up over your usual need - Don't leave home without it !!  (think of it like the spare tire for your car)   Continue duo nebs every 6-8 hours as needed for shortness of breath or wheezing  Please continue to take a daily antihistamine such as Claritin  Start nasal saline rinses twice daily Use distilled water Shake well Get bottle lukewarm like a baby bottle  Continue Fasenra  2. Medication management  We will discuss your Berna Bue questions with our biologic coordinator   We recommend today:  Orders Placed This Encounter  Procedures  . DG Chest 2 View    Standing Status:   Future    Standing Expiration Date:   01/29/2021    Order Specific Question:   Reason for Exam (SYMPTOM  OR DIAGNOSIS REQUIRED)    Answer:   wheezing, asthma exac    Order Specific Question:   Preferred imaging location?    Answer:   Internal    Order Specific  Question:   Radiology Contrast Protocol - do NOT remove file path    Answer:   [email or web address]   Orders Placed This Encounter  Procedures  . DG Chest 2 View   Meds ordered this encounter  Medications  . montelukast (SINGULAIR) 10 MG tablet    Sig: Take 1 tablet (10 mg total) by mouth at bedtime.    Dispense:  30 tablet    Refill:  11  . chlorpheniramine-HYDROcodone (TUSSIONEX PENNKINETIC ER) 10-8 MG/5ML SUER    Sig: Take 5 mLs by mouth at bedtime as needed for cough.    Dispense:  140 mL    Refill:  0  . predniSONE (DELTASONE) 10 MG tablet    Sig: Take 2 tablets (20mg  total) daily for the next 5 days. Take in the AM with food.    Dispense:  20 tablet    Refill:  0    Follow Up:    Return in about 8 days (around 12/08/2019), or if symptoms worsen or fail to improve, for Follow up with Wyn Quaker FNP-C.   Please do your part to reduce the spread of COVID-19:      Reduce your risk of any infection  and COVID19 by using the similar precautions used for avoiding the common cold  or flu:  Marland Kitchen Wash your hands often with soap and warm water for at least 20 seconds.  If soap and water are not readily available, use an alcohol-based hand sanitizer with at least 60% alcohol.  . If coughing or sneezing, cover your mouth and nose by coughing or sneezing into the elbow areas of your shirt or coat, into a tissue or into your sleeve (not your hands). Langley Gauss A MASK when in public  . Avoid shaking hands with others and consider head nods or verbal greetings only. . Avoid touching your eyes, nose, or mouth with unwashed hands.  . Avoid close contact with people who are sick. . Avoid places or events with large numbers of people in one location, like concerts or sporting events. . If you have some symptoms but not all symptoms, continue to monitor at home and seek medical attention if your symptoms worsen. . If you are having a medical emergency, call  911.   [place] / e-Visit: [email or web address]         [facility]: [facility]: [id number]                   [facility]: [id number]     It is flu season:   >>> Best ways to protect herself from the flu: Receive the yearly flu vaccine, practice good hand hygiene washing with soap and also using hand sanitizer when available, eat a nutritious meals, get adequate rest, hydrate appropriately   Please contact the office if your symptoms worsen or you have concerns that you are not improving.   Thank you for choosing [facility] for your healthcare, and for allowing [place] to partner with you on your healthcare journey. I am thankful to be able to provide care to you today.   Wyn Quaker FNP-C

## 2019-12-04 NOTE — Progress Notes (Signed)
Sure works for me   [employer]

## 2019-12-06 NOTE — Progress Notes (Signed)
Virtual Visit via Telephone Note  I connected with Linda Ellis on 12/07/19 at  9:30 AM EDT by telephone and verified that I am speaking with the correct person using two identifiers.  Location: Patient: Home Provider: Office [facility] - [id number], [place], [place], [place] [address]   I discussed the limitations, risks, security and privacy concerns of performing an evaluation and management service by telephone and the availability of in person appointments. I also discussed with the patient that there may be a patient responsible charge related to this service. The patient expressed understanding and agreed to proceed.  Patient consented to consult via telephone: Yes People present and their role in pt care: Pt   History of Present Illness:  44 year old former smoker followed in our office for severe persistent asthma  Past medical history: Morbid obesity, GERD Smoking history: Former smoker.  Quit July/2020.  1 pack/day.  31-pack-year smoking history Maintenance: Symbicort 160 TID, Fasenra, pred 20mg   Patient of Dr. Loanne Drilling  Chief complaint: 1 week follow-up   44 year old female former smoker followed in our office for severe persistent eosinophilic asthma.  Patient completing close follow-up with our office after receiving her second Fasenra injection earlier this week.  Patient is currently maintained on Symbicort 160 3 times daily.  She is received to Depo-Medrol injections.  Patient has been left on chronic steroid use.  Due to persistent asthmatic flares.   Patient reports that she is doing better today since her second Depo-Medrol injection.  She is maintained on Symbicort 160 3 times daily, Fasenra injections as well as daily prednisone of 20 mg.  She does endorse now she is having thick brown mucus blowing out of her nose.  She is noticed increased drainage since starting taking daily antihistamines.  She denies any fevers.  She has had the symptoms for greater  than 7 days.  She has not had any recent antibiotics.  She denies facial pain.  Patient's next Fasenra injection scheduled for tomorrow in our office.  Observations/Objective:  10/17/2018 - CBC with Diff - eos 5, eos absolute 0.5  02/13/2019-IgE-680  02/13/2019-CBC with differential-Eosinophils relative 0.8, eosinophils absolute 0.1 - ? On prednisone  02/08/2019-chest x-ray-chronic bronchial thickening likely related asthma, no acute abnormality  10/09/2019-pulmonary function test-FVC 2.37 (62% predicted), postbronchodilator ratio 64, postbronchodilator FEV1 1.57 (50% predicted), no bronchodilator response, DLCO 22.61 (100% predicted)   Social History   Tobacco Use  Smoking Status Former Smoker  . Packs/day: 1.00  . Years: 31.00  . Pack years: 31.00  . Types: Cigarettes  . Start date: 52  . Quit date: 02/24/2019  . Years since quitting: 0.7  Smokeless Tobacco Never Used   Immunization History  Administered Date(s) Administered  . Influenza,inj,Quad PF,6+ Mos 05/04/2018  . PFIZER SARS-COV-2 Vaccination 11/06/2019    Assessment and Plan:  Severe persistent asthma dependent on systemic steroids with acute exacerbation Improved and resolving asthma exacerbation Thick purulent brown mucus draining from nasal cavities No recent antibiotic use Maintained on Symbicort 3 times daily Fasenra Daily prednisone to 20 mg  Plan: Continue Symbicort 3 times daily Continue Fasenra injections Doxycycline today Continue daily prednisone of 20 mg for next 14 days, if doing well and breathing is stable patient will transition to daily prednisone of 10 mg on 12/21/2019 4-week follow-up in office  Allergic rhinitis Plan: Continue daily antihistamine Continue Fasenra Doxycycline today  Acute frontal sinusitis Thick brown mucus draining from nasal cavity With patient's significant corticosteroid use  in suspected immunosuppression given to Depo-Medrol injections as well as high steroid  tapers will treat with antibiotics today  Plan: Doxycycline today   Follow Up Instructions:  Return in about 4 weeks (around 01/04/2020), or if symptoms worsen or fail to improve, for Follow up with Wyn Quaker FNP-C.   I discussed the assessment and treatment plan with the patient. The patient was provided an opportunity to ask questions and all were answered. The patient agreed with the plan and demonstrated an understanding of the instructions.   The patient was advised to call back or seek an in-person evaluation if the symptoms worsen or if the condition fails to improve as anticipated.  I provided 23 minutes of non-face-to-face time during this encounter.   Lauraine Rinne, NP

## 2019-12-07 ENCOUNTER — Encounter: Payer: Self-pay | Admitting: Pulmonary Disease

## 2019-12-07 ENCOUNTER — Ambulatory Visit (INDEPENDENT_AMBULATORY_CARE_PROVIDER_SITE_OTHER): Payer: Medicaid Other | Admitting: Pulmonary Disease

## 2019-12-07 ENCOUNTER — Other Ambulatory Visit: Payer: Self-pay

## 2019-12-07 DIAGNOSIS — Z7952 Long term (current) use of systemic steroids: Secondary | ICD-10-CM

## 2019-12-07 DIAGNOSIS — J4551 Severe persistent asthma with (acute) exacerbation: Secondary | ICD-10-CM

## 2019-12-07 DIAGNOSIS — J0110 Acute frontal sinusitis, unspecified: Secondary | ICD-10-CM | POA: Insufficient documentation

## 2019-12-07 DIAGNOSIS — J309 Allergic rhinitis, unspecified: Secondary | ICD-10-CM

## 2019-12-07 MED ORDER — PREDNISONE 10 MG PO TABS: ORAL_TABLET | ORAL | 0 refills | Status: AC

## 2019-12-07 MED ORDER — DOXYCYCLINE HYCLATE 100 MG PO TABS: 100.0000 mg | ORAL_TABLET | Freq: Two times a day (BID) | ORAL | 0 refills | Status: DC

## 2019-12-07 NOTE — Assessment & Plan Note (Signed)
Thick brown mucus draining from nasal cavity With patient's significant corticosteroid use in suspected immunosuppression given to Depo-Medrol injections as well as high steroid tapers will treat with antibiotics today  Plan: Doxycycline today

## 2019-12-07 NOTE — Assessment & Plan Note (Signed)
Plan: Continue daily antihistamine Continue Fasenra Doxycycline today

## 2019-12-07 NOTE — Patient Instructions (Addendum)
You were seen today by Lauraine Rinne, NP  for:   1. Severe persistent asthma dependent on systemic steroids with acute exacerbation  - predniSONE (DELTASONE) 10 MG tablet; Take 2 tablets (20 mg total) by mouth daily with breakfast for 14 days, THEN 1 tablet (10 mg total) daily with breakfast.  Dispense: 58 tablet; Refill: 0  Continue Symbicort 160 taken 3 times daily  Continue Fasenra injections  Continue prednisone 20 mg daily >>>If breathing is stable on 5/20 decrease to prednisone 10 mg daily as discussed today   2. Allergic rhinitis, unspecified seasonality, unspecified trigger  - doxycycline (VIBRA-TABS) 100 MG tablet; Take 1 tablet (100 mg total) by mouth 2 (two) times daily.  Dispense: 14 tablet; Refill: 0  Continue daily Claritin  Start nasal saline rinses twice daily Use distilled water Shake well Get bottle lukewarm like a baby bottle  3. Acute frontal sinusitis, recurrence not specified  - doxycycline (VIBRA-TABS) 100 MG tablet; Take 1 tablet (100 mg total) by mouth 2 (two) times daily.  Dispense: 14 tablet; Refill: 0   We recommend today:   Meds ordered this encounter  Medications  . doxycycline (VIBRA-TABS) 100 MG tablet    Sig: Take 1 tablet (100 mg total) by mouth 2 (two) times daily.    Dispense:  14 tablet    Refill:  0  . predniSONE (DELTASONE) 10 MG tablet    Sig: Take 2 tablets (20 mg total) by mouth daily with breakfast for 14 days, THEN 1 tablet (10 mg total) daily with breakfast.    Dispense:  58 tablet    Refill:  0    Follow Up:    Return in about 4 weeks (around 01/04/2020), or if symptoms worsen or fail to improve, for Follow up with Wyn Quaker FNP-C.   Please do your part to reduce the spread of COVID-19:      Reduce your risk of any infection  and COVID19 by using the similar precautions used for avoiding the common cold or flu:  Marland Kitchen Wash your hands often with soap and warm water for at least 20 seconds.  If soap and water are not  readily available, use an alcohol-based hand sanitizer with at least 60% alcohol.  . If coughing or sneezing, cover your mouth and nose by coughing or sneezing into the elbow areas of your shirt or coat, into a tissue or into your sleeve (not your hands). Langley Gauss A MASK when in public  . Avoid shaking hands with others and consider head nods or verbal greetings only. . Avoid touching your eyes, nose, or mouth with unwashed hands.  . Avoid close contact with people who are sick. . Avoid places or events with large numbers of people in one location, like concerts or sporting events. . If you have some symptoms but not all symptoms, continue to monitor at home and seek medical attention if your symptoms worsen. . If you are having a medical emergency, call 911.   [place] / e-Visit: [email or web address]         [facility]: [facility]: [id number]                   [facility]: [id number]     It is flu season:   >>> Best ways to protect herself from the flu: Receive the yearly flu vaccine, practice good hand hygiene washing with soap and  also using hand sanitizer when available, eat a nutritious meals, get adequate rest, hydrate appropriately   Please contact the office if your symptoms worsen or you have concerns that you are not improving.   Thank you for choosing [facility] for your healthcare, and for allowing [place] to partner with you on your healthcare journey. I am thankful to be able to provide care to you today.   Wyn Quaker FNP-C

## 2019-12-07 NOTE — Assessment & Plan Note (Signed)
Improved and resolving asthma exacerbation Thick purulent brown mucus draining from nasal cavities No recent antibiotic use Maintained on Symbicort 3 times daily Fasenra Daily prednisone to 20 mg  Plan: Continue Symbicort 3 times daily Continue Fasenra injections Doxycycline today Continue daily prednisone of 20 mg for next 14 days, if doing well and breathing is stable patient will transition to daily prednisone of 10 mg on 12/21/2019 4-week follow-up in office

## 2019-12-08 ENCOUNTER — Other Ambulatory Visit: Payer: Self-pay

## 2019-12-08 ENCOUNTER — Ambulatory Visit (INDEPENDENT_AMBULATORY_CARE_PROVIDER_SITE_OTHER): Payer: Self-pay

## 2019-12-08 DIAGNOSIS — Z7952 Long term (current) use of systemic steroids: Secondary | ICD-10-CM

## 2019-12-08 DIAGNOSIS — J4551 Severe persistent asthma with (acute) exacerbation: Secondary | ICD-10-CM

## 2019-12-08 MED ORDER — BENRALIZUMAB 30 MG/ML ~~LOC~~ SOSY
30.0000 mg | PREFILLED_SYRINGE | Freq: Once | SUBCUTANEOUS | Status: AC
  Administered 2019-12-08: 30 mg via SUBCUTANEOUS

## 2019-12-08 NOTE — Progress Notes (Signed)
Have you been hospitalized within the last 10 days?  No Do you have a fever?  No Do you have a cough?  No Do you have a headache or sore throat? No  

## 2020-01-04 ENCOUNTER — Encounter: Payer: Self-pay | Admitting: *Deleted

## 2020-01-04 NOTE — Progress Notes (Signed)
@Patient  ID: Linda Ellis, female    DOB: March 26, 1976, 44 y.o.   MRN: [id number]  Chief Complaint  Patient presents with  . Follow-up    Asthma, ACT score 8    Referring provider: [name], PA-C  HPI:  44 year old former smoker followed in our office for severe persistent asthma  Past medical history: Morbid obesity, GERD Smoking history: Former smoker.  Quit July/2020.  1 pack/day.  31-pack-year smoking history Maintenance: Symbicort 160 TID, Fasenra, pred 10mg   Patient of Dr. Loanne Drilling  01/05/2020  - Visit   44 year old female former smoker followed in our office for severe persistent eosinophilic asthma.  Patient presenting today as a follow-up.  She is now maintained on Fasenra.  Next dose is expected to be received in July/09/2019.  She is taking Symbicort 160 3 times daily but she is wondering if she can transition back to twice daily.  She is taking 10 mg of prednisone daily.  Overall she feels that her breathing is stable and actually improved since last office visit.  She is interested in trying to transition off of steroids.  She still having rhinitis symptoms.  She has never been formally evaluated by allergy asthma.  She is purchased a sinus rinse machine to help with nasal saline rinses.  She is followed in our office by Dr. Loanne Drilling.  Patient reports controlled acid reflux.  Questionaires / Pulmonary Flowsheets:   ACT:  Asthma Control Test ACT Total Score  01/05/2020 8  11/30/2019 6  10/31/2019 9   MMRC: mMRC Dyspnea Scale mMRC Score  11/30/2019 1    Tests:   10/17/2018 - CBC with Diff - eos 5, eos absolute 0.5  02/13/2019-IgE-680  02/13/2019-CBC with differential-Eosinophils relative 0.8, eosinophils absolute 0.1 - ? On prednisone  02/08/2019-chest x-ray-chronic bronchial thickening likely related asthma, no acute abnormality  10/09/2019-pulmonary function test-FVC 2.37 (62% predicted), postbronchodilator ratio 64, postbronchodilator FEV1 1.57 (50% predicted), no  bronchodilator response, DLCO 22.61 (100% predicted)   FENO:  No results found for: NITRICOXIDE  PFT: PFT Results Latest Ref Rng & Units 10/09/2019  FVC-Pre L 2.37  FVC-Predicted Pre % 62  FVC-Post L 2.46  FVC-Predicted Post % 64  Pre FEV1/FVC % % 62  Post FEV1/FCV % % 64  FEV1-Pre L 1.48  FEV1-Predicted Pre % 47  FEV1-Post L 1.57  DLCO UNC% % 100  DLCO COR %Predicted % 135  TLC L 5.08  TLC % Predicted % 97  RV % Predicted % 170    WALK:  No flowsheet data found.  Imaging: No results found.  Lab Results:  CBC    Component Value Date/Time   WBC 15.3 (H) 02/13/2019 1036   RBC 4.38 02/13/2019 1036   HGB 13.6 02/13/2019 1036   HCT 40.4 02/13/2019 1036   PLT 419.0 (H) 02/13/2019 1036   MCV 92.2 02/13/2019 1036   MCH 29.7 10/17/2018 1234   MCHC 33.5 02/13/2019 1036   RDW 14.0 02/13/2019 1036   LYMPHSABS 5.9 (H) 02/13/2019 1036   MONOABS 0.9 02/13/2019 1036   EOSABS 0.1 02/13/2019 1036   BASOSABS 0.0 02/13/2019 1036    BMET    Component Value Date/Time   NA 139 08/23/2019 1125   K 3.8 08/23/2019 1125   CL 102 08/23/2019 1125   CO2 26 08/23/2019 1125   GLUCOSE 93 08/23/2019 1125   BUN 8 08/23/2019 1125   CREATININE 0.73 08/23/2019 1125   CALCIUM 9.0 08/23/2019 1125   GFRNONAA >60 08/23/2019 1125  GFRAA >60 08/23/2019 1125    BNP    Component Value Date/Time   BNP 23.0 04/09/2018 0118    ProBNP No results found for: PROBNP  Specialty Problems      Pulmonary Problems   Severe persistent asthma dependent on systemic steroids    10/17/2018 - CBC with Diff - eos 5, eos absolute 0.5  02/13/2019-IgE-680  02/13/2019-CBC with differential-Eosinophils relative 0.8, eosinophils absolute 0.1 - ? On prednisone  02/08/2019-chest x-ray-chronic bronchial thickening likely related asthma, no acute abnormality  10/09/2019-pulmonary function test-FVC 2.37 (62% predicted), postbronchodilator ratio 64, postbronchodilator FEV1 1.57 (50% predicted), no bronchodilator  response, DLCO 22.61 (100% predicted)       Acute frontal sinusitis   Allergic rhinitis      No Known Allergies  Immunization History  Administered Date(s) Administered  . Influenza,inj,Quad PF,6+ Mos 05/04/2018  . PFIZER SARS-COV-2 Vaccination 11/06/2019, 12/22/2019    Past Medical History:  Diagnosis Date  . Asthma 2008  . COPD (chronic obstructive pulmonary disease) ([place])   . GERD (gastroesophageal reflux disease)   . Multiple gastric ulcers   . Pneumonia     Tobacco History: Social History   Tobacco Use  Smoking Status Former Smoker  . Packs/day: 1.00  . Years: 31.00  . Pack years: 31.00  . Types: Cigarettes  . Start date: 44  . Quit date: 02/24/2019  . Years since quitting: 0.8  Smokeless Tobacco Never Used   Counseling given: Yes   Continue to not smoke  Outpatient Encounter Medications as of 01/05/2020  Medication Sig  . acetaminophen (TYLENOL) 500 MG tablet Take 500-1,000 mg by mouth every 6 (six) hours as needed.  Marland Kitchen albuterol (VENTOLIN HFA) 108 (90 Base) MCG/ACT inhaler Inhale 1-2 puffs into the lungs every 6 (six) hours as needed for wheezing or shortness of breath.  Marland Kitchen alum & mag hydroxide-simeth (MAALOX/MYLANTA) 200-200-20 MG/5ML suspension Take by mouth every 6 (six) hours as needed for indigestion or heartburn.  Marland Kitchen Benralizumab (FASENRA [place]) Inject into the skin.  . budesonide-formoterol (SYMBICORT) 160-4.5 MCG/ACT inhaler Inhale 2 puffs into the lungs 2 (two) times daily.  . chlorpheniramine-HYDROcodone (TUSSIONEX PENNKINETIC ER) 10-8 MG/5ML SUER Take 5 mLs by mouth at bedtime as needed for cough.  . Famotidine (PEPCID PO) Take by mouth.  Marland Kitchen ipratropium-albuterol (DUONEB) 0.5-2.5 (3) MG/3ML SOLN Take 3 mLs by nebulization every 6 (six) hours as needed.  . loratadine (CLARITIN) 10 MG tablet Take 1 tablet (10 mg total) by mouth daily as needed.  . montelukast (SINGULAIR) 10 MG tablet Take 1 tablet (10 mg total) by mouth at bedtime.  Marland Kitchen omeprazole  (PRILOSEC) 40 MG capsule Take 1 capsule (40 mg total) by mouth daily.  . predniSONE (DELTASONE) 10 MG tablet Take 2 tablets (20 mg total) by mouth daily with breakfast for 14 days, THEN 1 tablet (10 mg total) daily with breakfast.  . promethazine (PHENERGAN) 25 MG tablet Take 1 tablet (25 mg total) by mouth every 8 (eight) hours as needed for nausea or vomiting.  . predniSONE (DELTASONE) 5 MG tablet Take 1 tablet (5 mg total) by mouth daily with breakfast.  . [DISCONTINUED] ALBUTEROL IN Inhale 2 puffs into the lungs as needed. For shortness of breath   . [DISCONTINUED] doxycycline (VIBRA-TABS) 100 MG tablet Take 1 tablet (100 mg total) by mouth 2 (two) times daily.  . [DISCONTINUED] pantoprazole (PROTONIX) 20 MG tablet Take 20 mg by mouth daily.   No facility-administered encounter medications on file as of 01/05/2020.  Review of Systems  Review of Systems  Constitutional: Positive for fatigue. Negative for activity change and fever.  HENT: Positive for congestion, postnasal drip and rhinorrhea. Negative for sinus pressure, sinus pain and sore throat.   Respiratory: Positive for cough and shortness of breath. Negative for wheezing.   Cardiovascular: Negative for chest pain and palpitations.  Gastrointestinal: Negative for diarrhea, nausea and vomiting.  Musculoskeletal: Negative for arthralgias.  Neurological: Negative for dizziness.  Psychiatric/Behavioral: Negative for sleep disturbance. The patient is not nervous/anxious.      Physical Exam  BP 130/72 (BP Location: Left Wrist, Cuff Size: Normal)   Pulse 95   Temp 99 F (37.2 C) (Oral)   Ht 5' (1.524 m)   Wt (!) 318 lb 12.8 oz (144.6 kg)   SpO2 95%   BMI 62.26 kg/m   Wt Readings from Last 5 Encounters:  01/05/20 (!) 318 lb 12.8 oz (144.6 kg)  11/30/19 (!) 310 lb (140.6 kg)  10/31/19 (!) 307 lb 3.2 oz (139.3 kg)  10/09/19 299 lb (135.6 kg)  03/02/19 (!) 328 lb (148.8 kg)    BMI Readings from Last 5 Encounters:   01/05/20 62.26 kg/m  11/30/19 51.59 kg/m  10/31/19 51.12 kg/m  10/09/19 49.76 kg/m  03/02/19 54.58 kg/m     Physical Exam Vitals and nursing note reviewed.  Constitutional:      General: She is not in acute distress.    Appearance: Normal appearance. She is obese.  HENT:     Head: Normocephalic and atraumatic.     Right Ear: Tympanic membrane, ear canal and external ear normal. There is no impacted cerumen.     Left Ear: Tympanic membrane, ear canal and external ear normal. There is no impacted cerumen.     Nose: Congestion and rhinorrhea present.     Mouth/Throat:     Mouth: Mucous membranes are moist.     Pharynx: Oropharynx is clear.     Comments: PND Eyes:     Pupils: Pupils are equal, round, and reactive to light.  Cardiovascular:     Rate and Rhythm: Normal rate and regular rhythm.     Pulses: Normal pulses.     Heart sounds: Normal heart sounds. No murmur.  Pulmonary:     Breath sounds: Normal breath sounds. No decreased air movement. No decreased breath sounds, wheezing or rales.  Musculoskeletal:     Cervical back: Normal range of motion.  Skin:    General: Skin is warm and dry.     Capillary Refill: Capillary refill takes less than 2 seconds.  Neurological:     General: No focal deficit present.     Mental Status: She is alert and oriented to person, place, and time. Mental status is at baseline.     Gait: Gait normal.  Psychiatric:        Mood and Affect: Mood normal.        Behavior: Behavior normal.        Thought Content: Thought content normal.        Judgment: Judgment normal.       Assessment & Plan:   Severe persistent asthma dependent on systemic steroids Plan: Continue Symbicort 160 2 times daily Continue Fasenra injections Continue Singulair Continue daily and histamine Continue nasal saline rinses Start prednisone of 5 mg daily on 01/06/2020, will do this for 14 days then stop 4-week follow-up in office  Allergic  rhinitis Plan: Continue Fasenra Continue daily antihistamine Continue nasal saline rinses Continue Singulair If  symptoms persist may need to consider CT imaging  Gastroesophageal reflux disease Patient reporting well controlled acid reflux symptoms today  Plan: Continue PPI  Obesity BMI 62.26 This is a component patient has difficulty with her breathing  Plan: Need to work on reducing BMI overall Weight loss goal of 10% could be the equivalent of adding an inhaler    Return in about 6 weeks (around 02/16/2020), or if symptoms worsen or fail to improve, for Follow up with Dr. Loanne Drilling, Follow up with Wyn Quaker FNP-C.   Lauraine Rinne, NP 01/05/2020   This appointment required 34 minutes of patient care (this includes precharting, chart review, review of results, face-to-face care, etc.).

## 2020-01-05 ENCOUNTER — Other Ambulatory Visit: Payer: Self-pay

## 2020-01-05 ENCOUNTER — Ambulatory Visit (INDEPENDENT_AMBULATORY_CARE_PROVIDER_SITE_OTHER): Payer: Self-pay | Admitting: Pulmonary Disease

## 2020-01-05 ENCOUNTER — Encounter: Payer: Self-pay | Admitting: Pulmonary Disease

## 2020-01-05 VITALS — BP 130/72 | HR 95 | Temp 99.0°F | Ht 65.0 in | Wt 318.8 lb

## 2020-01-05 DIAGNOSIS — Z7952 Long term (current) use of systemic steroids: Secondary | ICD-10-CM

## 2020-01-05 DIAGNOSIS — J4550 Severe persistent asthma, uncomplicated: Secondary | ICD-10-CM

## 2020-01-05 DIAGNOSIS — Z79899 Other long term (current) drug therapy: Secondary | ICD-10-CM

## 2020-01-05 DIAGNOSIS — J4551 Severe persistent asthma with (acute) exacerbation: Secondary | ICD-10-CM

## 2020-01-05 DIAGNOSIS — K219 Gastro-esophageal reflux disease without esophagitis: Secondary | ICD-10-CM

## 2020-01-05 DIAGNOSIS — Z6841 Body Mass Index (BMI) 40.0 and over, adult: Secondary | ICD-10-CM

## 2020-01-05 DIAGNOSIS — J309 Allergic rhinitis, unspecified: Secondary | ICD-10-CM

## 2020-01-05 MED ORDER — PREDNISONE 5 MG PO TABS: 5.0000 mg | ORAL_TABLET | Freq: Every day | ORAL | 0 refills | Status: DC

## 2020-01-05 NOTE — Assessment & Plan Note (Signed)
Plan: Continue Fasenra Continue daily antihistamine Continue nasal saline rinses Continue Singulair If symptoms persist may need to consider CT imaging

## 2020-01-05 NOTE — Assessment & Plan Note (Signed)
Plan: Continue Symbicort 160 2 times daily Continue Fasenra injections Continue Singulair Continue daily and histamine Continue nasal saline rinses Start prednisone of 5 mg daily on 01/06/2020, will do this for 14 days then stop 4-week follow-up in office

## 2020-01-05 NOTE — Patient Instructions (Addendum)
You were seen today by Lauraine Rinne, NP  for:   1. Severe persistent asthma dependent on systemic steroids with acute exacerbation  - predniSONE (DELTASONE) 5 MG tablet; Take 1 tablet (5 mg total) by mouth daily with breakfast.  Dispense: 14 tablet; Refill: 0  Continue Symbicort 160 >>> 2 puffs in the morning right when you wake up, rinse out your mouth after use, 12 hours later 2 puffs, rinse after use >>> Take this daily, no matter what >>> This is not a rescue inhaler   Try Delsym cough medicine over-the-counter  If cough persists then can use Tussionex This medication is sedating  If you continue to have persistent allergic rhinitis or nasal symptoms we may need to consider getting a CT of your sinuses  2. Allergic rhinitis, unspecified seasonality, unspecified trigger  Continue Singulair  Continue daily allergy pill  Continue nasal saline rinses  3.  Medication management  We will work on decreasing your prednisone from 10 mg to 5 mg daily I have sent in a 14-day supply of this.  Please start taking 5 mg of prednisone tomorrow morning.  When this dose runs out we will trial you off of steroids completely.   We recommend today:   Meds ordered this encounter  Medications  . predniSONE (DELTASONE) 5 MG tablet    Sig: Take 1 tablet (5 mg total) by mouth daily with breakfast.    Dispense:  14 tablet    Refill:  0    Follow Up:    Return in about 6 weeks (around 02/16/2020), or if symptoms worsen or fail to improve, for Follow up with Dr. Loanne Drilling, Follow up with Wyn Quaker FNP-C.   Please do your part to reduce the spread of COVID-19:      Reduce your risk of any infection  and COVID19 by using the similar precautions used for avoiding the common cold or flu:  Marland Kitchen Wash your hands often with soap and warm water for at least 20 seconds.  If soap and water are not readily available, use an alcohol-based hand sanitizer with at least 60% alcohol.  . If coughing or  sneezing, cover your mouth and nose by coughing or sneezing into the elbow areas of your shirt or coat, into a tissue or into your sleeve (not your hands). Langley Gauss A MASK when in public  . Avoid shaking hands with others and consider head nods or verbal greetings only. . Avoid touching your eyes, nose, or mouth with unwashed hands.  . Avoid close contact with people who are sick. . Avoid places or events with large numbers of people in one location, like concerts or sporting events. . If you have some symptoms but not all symptoms, continue to monitor at home and seek medical attention if your symptoms worsen. . If you are having a medical emergency, call 911.   [place] / e-Visit: [email or web address]         [facility]: [facility]: [phone]                   [facility]: [phone]     It is flu season:   >>> Best ways to protect herself from the flu: Receive the yearly flu vaccine, practice good hand hygiene washing with soap and also using hand sanitizer when available, eat a nutritious meals, get adequate rest, hydrate appropriately   Please contact  the office if your symptoms worsen or you have concerns that you are not improving.   Thank you for choosing [facility] for your healthcare, and for allowing [place] to partner with you on your healthcare journey. I am thankful to be able to provide care to you today.   Wyn Quaker FNP-C

## 2020-01-05 NOTE — Assessment & Plan Note (Signed)
Patient reporting well controlled acid reflux symptoms today  Plan: Continue PPI

## 2020-01-05 NOTE — Assessment & Plan Note (Addendum)
Plan: Need to work on reducing BMI overall Weight loss goal of 10% could be the equivalent of adding an inhaler

## 2020-01-11 ENCOUNTER — Other Ambulatory Visit: Payer: Self-pay

## 2020-01-11 ENCOUNTER — Ambulatory Visit: Payer: Medicaid Other | Admitting: Physician Assistant

## 2020-01-11 ENCOUNTER — Encounter: Payer: Self-pay | Admitting: Physician Assistant

## 2020-01-11 VITALS — BP 112/66 | HR 86 | Temp 97.7°F

## 2020-01-11 DIAGNOSIS — Z7952 Long term (current) use of systemic steroids: Secondary | ICD-10-CM

## 2020-01-11 DIAGNOSIS — R519 Headache, unspecified: Secondary | ICD-10-CM

## 2020-01-11 DIAGNOSIS — J0110 Acute frontal sinusitis, unspecified: Secondary | ICD-10-CM

## 2020-01-11 MED ORDER — AMOXICILLIN-POT CLAVULANATE 875-125 MG PO TABS: 1.0000 | ORAL_TABLET | Freq: Two times a day (BID) | ORAL | 0 refills | Status: DC

## 2020-01-11 MED ORDER — LORATADINE 10 MG PO TABS: 10.0000 mg | ORAL_TABLET | Freq: Every day | ORAL | 1 refills | Status: DC | PRN

## 2020-01-11 MED ORDER — MONTELUKAST SODIUM 10 MG PO TABS: 10.0000 mg | ORAL_TABLET | Freq: Every day | ORAL | 1 refills | Status: DC

## 2020-01-11 NOTE — Progress Notes (Signed)
BP 112/66   Pulse 86   Temp 97.7 F (36.5 C)   LMP 12/19/2019 (Approximate)    Subjective:    Patient ID: Linda Ellis, female    DOB: [birth year], 43 y.o.   MRN: [id number]  HPI: Linda Ellis is a 44 y.o. female presenting on 01/11/2020 for No chief complaint on file.   HPI  Pt had a negative covid 19 screening questionnaire.   O2 saturation not registering today due to artificial nails.       Pt is 57yoF with severe asthma & copd who called this morning requesting appointment for HA.   She has had the HA since Saturday.  APAP din't help.   She has No history of migraine.  HA is frontal.    She isn't taking the singulair that rx because it was sent to a local pharmacy and was expensive.  Discussed with pt that it is available through [facility] at no cost.   Pt says she was given doxycycine for her sinuses by pulmonologist.   She says that seemed to help some while she was taking it but then it returned after she finished the antibiotics.  She has had both of her covid vaccinations   She says her GERD is great and omeprazole is working well.    Relevant past medical, surgical, family and social history reviewed and updated as indicated. Interim medical history since our last visit reviewed. Allergies and medications reviewed and updated.    Current Outpatient Medications:  .  acetaminophen (TYLENOL) 500 MG tablet, Take 500-1,000 mg by mouth every 6 (six) hours as needed., Disp: , Rfl:  .  albuterol (VENTOLIN HFA) 108 (90 Base) MCG/ACT inhaler, Inhale 1-2 puffs into the lungs every 6 (six) hours as needed for wheezing or shortness of breath., Disp: 8 g, Rfl: 0 .  Benralizumab (FASENRA [place]), Inject into the skin., Disp: , Rfl:  .  budesonide-formoterol (SYMBICORT) 160-4.5 MCG/ACT inhaler, Inhale 2 puffs into the lungs 2 (two) times daily., Disp: 1 Inhaler, Rfl: 0 .  Famotidine (PEPCID PO), Take by mouth., Disp: , Rfl:  .  ipratropium-albuterol (DUONEB) 0.5-2.5 (3)  MG/3ML SOLN, Take 3 mLs by nebulization every 6 (six) hours as needed., Disp: 360 mL, Rfl: 1 .  loratadine (CLARITIN) 10 MG tablet, Take 1 tablet (10 mg total) by mouth daily as needed., Disp: 90 tablet, Rfl: 0 .  omeprazole (PRILOSEC) 40 MG capsule, Take 1 capsule (40 mg total) by mouth daily., Disp: 90 capsule, Rfl: 1 .  predniSONE (DELTASONE) 10 MG tablet, Take 2 tablets (20 mg total) by mouth daily with breakfast for 14 days, THEN 1 tablet (10 mg total) daily with breakfast., Disp: 58 tablet, Rfl: 0 .  predniSONE (DELTASONE) 5 MG tablet, Take 1 tablet (5 mg total) by mouth daily with breakfast., Disp: 14 tablet, Rfl: 0 .  montelukast (SINGULAIR) 10 MG tablet, Take 1 tablet (10 mg total) by mouth at bedtime. (Patient not taking: Reported on 01/11/2020), Disp: 30 tablet, Rfl: 11 .  promethazine (PHENERGAN) 25 MG tablet, Take 1 tablet (25 mg total) by mouth every 8 (eight) hours as needed for nausea or vomiting. (Patient not taking: Reported on 01/11/2020), Disp: 20 tablet, Rfl: 0    Review of Systems  Per HPI unless specifically indicated above     Objective:    BP 112/66   Pulse 86   Temp 97.7 F (36.5 C)   LMP 12/19/2019 (Approximate)   Wt Readings from Last 3  Encounters:  01/05/20 (!) 318 lb 12.8 oz (144.6 kg)  11/30/19 (!) 310 lb (140.6 kg)  10/31/19 (!) 307 lb 3.2 oz (139.3 kg)    Physical Exam Vitals reviewed.  Constitutional:      General: She is not in acute distress.    Appearance: She is well-developed. She is obese. She is not toxic-appearing.  HENT:     Head: Normocephalic and atraumatic.     Right Ear: Tympanic membrane and ear canal normal.     Left Ear: Tympanic membrane and ear canal normal.     Nose: Mucosal edema, congestion and rhinorrhea present.     Mouth/Throat:     Mouth: Mucous membranes are moist.     Pharynx: Oropharynx is clear. No oropharyngeal exudate.  Eyes:     Extraocular Movements: Extraocular movements intact.     Conjunctiva/sclera:  Conjunctivae normal.     Pupils: Pupils are equal, round, and reactive to light.  Cardiovascular:     Rate and Rhythm: Normal rate and regular rhythm.  Pulmonary:     Effort: Pulmonary effort is normal.     Breath sounds: Normal breath sounds.  Abdominal:     General: Bowel sounds are normal.     Palpations: Abdomen is soft. There is no mass.     Tenderness: There is no abdominal tenderness.  Musculoskeletal:     Cervical back: Neck supple.  Lymphadenopathy:     Cervical: No cervical adenopathy.  Skin:    General: Skin is warm and dry.  Neurological:     Mental Status: She is alert and oriented to person, place, and time.     Cranial Nerves: Cranial nerves are intact.     Sensory: Sensation is intact.     Motor: Motor function is intact. No weakness or tremor.     Gait: Gait is intact.  Psychiatric:        Behavior: Behavior normal.           Assessment & Plan:   Encounter Diagnoses  Name Primary?  . Severe persistent asthma dependent on systemic steroids with acute exacerbation   . Nonintractable headache, unspecified chronicity pattern, unspecified headache type Yes  . Acute frontal sinusitis, recurrence not specified      -discusssed with pt that HA appears to be related to sinusitis -rx augmentin -Send rx for her singulair and claritin to [facility].  -pt to follow up in 3-4 months for GERD.  She is to RTO if her HA persists or if she develops new symtpoms

## 2020-01-12 ENCOUNTER — Telehealth: Payer: Self-pay | Admitting: Pulmonary Disease

## 2020-01-12 MED ORDER — AMOXICILLIN-POT CLAVULANATE 875-125 MG PO TABS: 1.0000 | ORAL_TABLET | Freq: Two times a day (BID) | ORAL | 0 refills | Status: DC

## 2020-01-12 NOTE — Telephone Encounter (Signed)
Yes I am fine with that- Augmentin 1 tab twice daily x 10 days

## 2020-01-12 NOTE — Telephone Encounter (Signed)
Spoke with patient. She stated that she has had a sinus headache for the past week. She went to her PCP yesterday, [name] PA-C and she prescribed augmentin. For some reason the RX was not sent to the pharmacy. It was printed out instead and she did not receive the print out.   She wants to know if Aaron Edelman could send in the abx for her since her PCP's office is closed today and will not be open again until Monday. I advised her that Aaron Edelman was out of the office but I would ask one of our NPs. She verbalized understanding.   Below is the OV note from yesterday.   "-discusssed with pt that HA appears to be related to sinusitis -rx augmentin -Send rx for her singulair and claritin to [facility].  -pt to follow up in 3-4 months for GERD.  She is to RTO if her HA persists or if she develops new symtpoms"  Printed augmentin instructions were take 1 tablet by mouth 2 times daily for 10 days.   Beth, would you be ok with [place] sending in the abx since [place] nor JE are in office today? Thanks!

## 2020-01-12 NOTE — Telephone Encounter (Signed)
Spoke with pt and advised rx sent to pharmacy. Nothing further is needed.   

## 2020-01-22 ENCOUNTER — Ambulatory Visit: Payer: Medicaid Other | Admitting: Physician Assistant

## 2020-01-22 ENCOUNTER — Telehealth: Payer: Self-pay | Admitting: Pulmonary Disease

## 2020-01-22 NOTE — Telephone Encounter (Signed)
Berna Bue Order: 30mg  #1 prefilled syringe Ordered date: 01/22/20 Expected date of arrival: 2-3 business days Ordered by: Moro: [place]&Me

## 2020-01-24 NOTE — Telephone Encounter (Signed)
Berna Bue Shipment Received:  30mg  #1 prefilled syringe Medication arrival date: 01/24/2020 Lot #: [id number] Exp date: 04/2021 Received by: Desmond Dike, [place]

## 2020-01-25 ENCOUNTER — Telehealth: Payer: Self-pay | Admitting: Student

## 2020-01-25 NOTE — Telephone Encounter (Signed)
Pt called c/o vaginal itching, redness, soreness, and discharge after taking antibiotic augmentin. Pt reports using monistat 7 for 2 days and has had no noticeable improvement. Pt's preferred pharmacy is [facility] on [address]  Pt was advised to continue to use monistat for the complete 7 days. Pt is call the office if no improvements after she has completed her 7 day course of monistat. Pt verbalized understanding.

## 2020-01-31 ENCOUNTER — Telehealth: Payer: Self-pay | Admitting: Pulmonary Disease

## 2020-01-31 ENCOUNTER — Other Ambulatory Visit: Payer: Self-pay

## 2020-01-31 ENCOUNTER — Ambulatory Visit: Payer: Medicaid Other | Admitting: Pulmonary Disease

## 2020-01-31 ENCOUNTER — Encounter: Payer: Self-pay | Admitting: Pulmonary Disease

## 2020-01-31 VITALS — BP 122/78 | HR 88 | Temp 98.2°F | Ht 65.0 in | Wt 320.0 lb

## 2020-01-31 DIAGNOSIS — J309 Allergic rhinitis, unspecified: Secondary | ICD-10-CM

## 2020-01-31 DIAGNOSIS — Z79899 Other long term (current) drug therapy: Secondary | ICD-10-CM

## 2020-01-31 DIAGNOSIS — Z7952 Long term (current) use of systemic steroids: Secondary | ICD-10-CM

## 2020-01-31 DIAGNOSIS — J4550 Severe persistent asthma, uncomplicated: Secondary | ICD-10-CM

## 2020-01-31 MED ORDER — FLUTICASONE PROPIONATE 50 MCG/ACT NA SUSP: 1.0000 | Freq: Two times a day (BID) | NASAL | 3 refills | Status: DC

## 2020-01-31 MED ORDER — HYDROCOD POLST-CPM POLST ER 10-8 MG/5ML PO SUER: 5.0000 mL | Freq: Every evening | ORAL | 0 refills | Status: DC | PRN

## 2020-01-31 MED ORDER — METHYLPREDNISOLONE ACETATE 80 MG/ML IJ SUSP
120.0000 mg | Freq: Once | INTRAMUSCULAR | Status: AC
  Administered 2020-01-31: 120 mg via INTRAMUSCULAR

## 2020-01-31 MED ORDER — PREDNISONE 10 MG PO TABS: ORAL_TABLET | ORAL | 0 refills | Status: AC

## 2020-01-31 NOTE — Progress Notes (Addendum)
@Patient  ID: Linda Ellis, female    DOB: [birth year], 44 y.o.   MRN: [id number]  Chief Complaint  Patient presents with  . Follow-up    wheezing, DOE worsened, prductive cough, clear sputum    Referring provider: [name], PA-C  HPI:  44 year old former smoker followed in our office for severe persistent asthma  Past medical history: Morbid obesity, GERD Smoking history: Former smoker.  Quit July/2020.  1 pack/day.  31-pack-year smoking history Maintenance: Symbicort 160 BID, Fasenra Patient of Dr. Loanne Drilling  01/31/2020  - Visit   44 year old female former smoker followed in our office for asthma.  Patient is prone to recurrent flares.  Previously steroid dependent.  Patient was able to transition off of steroids about 6 weeks ago.  Unfortunately patient's symptoms began to worsen 2 weeks ago.  Patient reports that she has been really struggling with her breathing with increased shortness of breath as well as increased nasal drainage.  She was recently treated with Augmentin.  Unfortunately she developed a yeast infection for this.  She was treated empirically by primary care and then later by our office with Augmentin due to having a headache with suspected sinus infection.  We will discuss this today.  Patient remains adherent to Symbicort 160 and Fasenra.  Patient remains adherent to Claritin.  She is not using any nasal medications.  She does use nasal saline rinses.  ACT score today is 8.  Questionaires / Pulmonary Flowsheets:   ACT:  Asthma Control Test ACT Total Score  01/31/2020 8  01/05/2020 8  11/30/2019 6    MMRC: mMRC Dyspnea Scale mMRC Score  11/30/2019 1    [place]:  No flowsheet data found.  Tests:    10/17/2018 - CBC with Diff - eos 5, eos absolute 0.5  02/13/2019-IgE-680  02/13/2019-CBC with differential-Eosinophils relative 0.8, eosinophils absolute 0.1 - ? On prednisone  02/08/2019-chest x-ray-chronic bronchial thickening likely related asthma, no  acute abnormality  10/09/2019-pulmonary function test-FVC 2.37 (62% predicted), postbronchodilator ratio 64, postbronchodilator FEV1 1.57 (50% predicted), no bronchodilator response, DLCO 22.61 (100% predicted)  FENO:  No results found for: NITRICOXIDE  PFT: PFT Results Latest Ref Rng & Units 10/09/2019  FVC-Pre L 2.37  FVC-Predicted Pre % 62  FVC-Post L 2.46  FVC-Predicted Post % 64  Pre FEV1/FVC % % 62  Post FEV1/FCV % % 64  FEV1-Pre L 1.48  FEV1-Predicted Pre % 47  FEV1-Post L 1.57  DLCO UNC% % 100  DLCO COR %Predicted % 135  TLC L 5.08  TLC % Predicted % 97  RV % Predicted % 170    WALK:  No flowsheet data found.  Imaging: No results found.  Lab Results:  CBC    Component Value Date/Time   WBC 15.3 (H) 02/13/2019 1036   RBC 4.38 02/13/2019 1036   HGB 13.6 02/13/2019 1036   HCT 40.4 02/13/2019 1036   PLT 419.0 (H) 02/13/2019 1036   MCV 92.2 02/13/2019 1036   MCH 29.7 10/17/2018 1234   MCHC 33.5 02/13/2019 1036   RDW 14.0 02/13/2019 1036   LYMPHSABS 5.9 (H) 02/13/2019 1036   MONOABS 0.9 02/13/2019 1036   EOSABS 0.1 02/13/2019 1036   BASOSABS 0.0 02/13/2019 1036    BMET    Component Value Date/Time   NA 139 08/23/2019 1125   K 3.8 08/23/2019 1125   CL 102 08/23/2019 1125   CO2 26 08/23/2019 1125   GLUCOSE 93 08/23/2019 1125   BUN 8 08/23/2019 1125  CREATININE 0.73 08/23/2019 1125   CALCIUM 9.0 08/23/2019 1125   GFRNONAA >60 08/23/2019 1125   GFRAA >60 08/23/2019 1125    BNP    Component Value Date/Time   BNP 23.0 04/09/2018 0118    ProBNP No results found for: PROBNP  Specialty Problems      Pulmonary Problems   Severe persistent asthma dependent on systemic steroids    10/17/2018 - CBC with Diff - eos 5, eos absolute 0.5  02/13/2019-IgE-680  02/13/2019-CBC with differential-Eosinophils relative 0.8, eosinophils absolute 0.1 - ? On prednisone  02/08/2019-chest x-ray-chronic bronchial thickening likely related asthma, no acute  abnormality  10/09/2019-pulmonary function test-FVC 2.37 (62% predicted), postbronchodilator ratio 64, postbronchodilator FEV1 1.57 (50% predicted), no bronchodilator response, DLCO 22.61 (100% predicted)       Acute frontal sinusitis   Allergic rhinitis      No Known Allergies  Immunization History  Administered Date(s) Administered  . Influenza,inj,Quad PF,6+ Mos 05/04/2018  . PFIZER SARS-COV-2 Vaccination 11/06/2019, 12/22/2019    Past Medical History:  Diagnosis Date  . Asthma 2008  . COPD (chronic obstructive pulmonary disease) ([place])   . GERD (gastroesophageal reflux disease)   . Multiple gastric ulcers   . Pneumonia     Tobacco History: Social History   Tobacco Use  Smoking Status Former Smoker  . Packs/day: 1.00  . Years: 31.00  . Pack years: 31.00  . Types: Cigarettes  . Start date: 45  . Quit date: 02/24/2019  . Years since quitting: 0.9  Smokeless Tobacco Never Used   Counseling given: Yes   Continue to not smoke  Outpatient Encounter Medications as of 01/31/2020  Medication Sig  . acetaminophen (TYLENOL) 500 MG tablet Take 500-1,000 mg by mouth every 6 (six) hours as needed.  Marland Kitchen albuterol (VENTOLIN HFA) 108 (90 Base) MCG/ACT inhaler Inhale 1-2 puffs into the lungs every 6 (six) hours as needed for wheezing or shortness of breath.  Marland Kitchen Benralizumab (FASENRA [place]) Inject into the skin.  . budesonide-formoterol (SYMBICORT) 160-4.5 MCG/ACT inhaler Inhale 2 puffs into the lungs 2 (two) times daily.  . Famotidine (PEPCID PO) Take by mouth.  Marland Kitchen ipratropium-albuterol (DUONEB) 0.5-2.5 (3) MG/3ML SOLN Take 3 mLs by nebulization every 6 (six) hours as needed.  . loratadine (CLARITIN) 10 MG tablet Take 1 tablet (10 mg total) by mouth daily as needed.  . montelukast (SINGULAIR) 10 MG tablet Take 1 tablet (10 mg total) by mouth at bedtime.  Marland Kitchen omeprazole (PRILOSEC) 40 MG capsule Take 1 capsule (40 mg total) by mouth daily.  . promethazine (PHENERGAN) 25 MG tablet Take 1  tablet (25 mg total) by mouth every 8 (eight) hours as needed for nausea or vomiting.  Marland Kitchen amoxicillin-clavulanate (AUGMENTIN) 875-125 MG tablet Take 1 tablet by mouth 2 (two) times daily. (Patient not taking: Reported on 01/31/2020)  . chlorpheniramine-HYDROcodone (TUSSIONEX PENNKINETIC ER) 10-8 MG/5ML SUER Take 5 mLs by mouth at bedtime as needed for cough.  . fluticasone (FLONASE) 50 MCG/ACT nasal spray Place 1 spray into both nostrils 2 (two) times daily.  . predniSONE (DELTASONE) 10 MG tablet Take 4 tablets (40 mg total) by mouth daily with breakfast for 3 days, THEN 3 tablets (30 mg total) daily with breakfast for 3 days, THEN 2 tablets (20 mg total) daily with breakfast for 3 days, THEN 1 tablet (10 mg total) daily with breakfast for 3 days.  . predniSONE (DELTASONE) 5 MG tablet Take 1 tablet (5 mg total) by mouth daily with breakfast. (Patient not taking: Reported on  01/31/2020)  . [DISCONTINUED] ALBUTEROL IN Inhale 2 puffs into the lungs as needed. For shortness of breath   . [DISCONTINUED] fluticasone (FLONASE) 50 MCG/ACT nasal spray Place 1 spray into both nostrils 2 (two) times daily.  . [EXPIRED] methylPREDNISolone acetate (DEPO-MEDROL) injection 120 mg    No facility-administered encounter medications on file as of 01/31/2020.     Review of Systems  Review of Systems  Constitutional: Positive for fatigue. Negative for activity change and fever.  HENT: Positive for congestion, postnasal drip and rhinorrhea. Negative for sinus pressure, sinus pain and sore throat.   Respiratory: Positive for cough, shortness of breath and wheezing.   Cardiovascular: Negative for chest pain and palpitations.  Gastrointestinal: Negative for diarrhea, nausea and vomiting.  Musculoskeletal: Negative for arthralgias.  Neurological: Negative for dizziness.  Psychiatric/Behavioral: Negative for sleep disturbance. The patient is not nervous/anxious.      Physical Exam  BP 122/78 (BP Location: Left Arm,  Cuff Size: Normal)   Pulse 88   Temp 98.2 F (36.8 C) (Oral)   Ht 5\' 5"  (1.651 m)   Wt (!) 320 lb (145.2 kg)   SpO2 95%   BMI 53.25 kg/m   Wt Readings from Last 5 Encounters:  01/31/20 (!) 320 lb (145.2 kg)  01/05/20 (!) 318 lb 12.8 oz (144.6 kg)  11/30/19 (!) 310 lb (140.6 kg)  10/31/19 (!) 307 lb 3.2 oz (139.3 kg)  10/09/19 299 lb (135.6 kg)    BMI Readings from Last 5 Encounters:  01/31/20 53.25 kg/m  01/05/20 53.05 kg/m  11/30/19 51.59 kg/m  10/31/19 51.12 kg/m  10/09/19 49.76 kg/m     Physical Exam Vitals and nursing note reviewed.  Constitutional:      General: She is not in acute distress.    Appearance: Normal appearance. She is obese.  HENT:     Head: Normocephalic and atraumatic.     Right Ear: Tympanic membrane, ear canal and external ear normal. There is no impacted cerumen.     Left Ear: Tympanic membrane, ear canal and external ear normal. There is no impacted cerumen.     Nose: Congestion and rhinorrhea present.     Mouth/Throat:     Mouth: Mucous membranes are moist.     Pharynx: Oropharynx is clear.     Comments: Postnasal drip Eyes:     Pupils: Pupils are equal, round, and reactive to light.  Cardiovascular:     Rate and Rhythm: Normal rate and regular rhythm.     Pulses: Normal pulses.     Heart sounds: Normal heart sounds. No murmur heard.   Pulmonary:     Effort: Pulmonary effort is normal. No respiratory distress.     Breath sounds: No decreased air movement. Wheezing (Inspiratory and expiratory) present. No decreased breath sounds or rales.  Musculoskeletal:     Cervical back: Normal range of motion.  Skin:    General: Skin is warm and dry.     Capillary Refill: Capillary refill takes less than 2 seconds.  Neurological:     General: No focal deficit present.     Mental Status: She is alert and oriented to person, place, and time. Mental status is at baseline.     Gait: Gait normal.  Psychiatric:        Mood and Affect: Mood  normal.        Behavior: Behavior normal.        Thought Content: Thought content normal.        Judgment:  Judgment normal.     Assessment & Plan:   Severe persistent asthma dependent on systemic steroids Plan: Depo 120 Start prednisone taper tomorrow Tussionex cough medicine today Close follow-up with our office in 10 to 14 days Continue Symbicort 160 Continue Fasenra Start Flonase Continue nasal saline rinses Continue Claritin  Allergic rhinitis Plan: Continue Claritin Can continue nasal saline rinses Start Flonase  Medication management Plan: Continue Berna Bue    Return in about 10 days (around 02/10/2020), or if symptoms worsen or fail to improve, for Follow up with Dr. Loanne Drilling, Follow up with Wyn Quaker FNP-C.   Lauraine Rinne, NP 01/31/2020   This appointment required 33 minutes of patient care (this includes precharting, chart review, review of results, face-to-face care, etc.).

## 2020-01-31 NOTE — Patient Instructions (Addendum)
You were seen today by Lauraine Rinne, NP  for:   1. Severe persistent asthma dependent on systemic steroids  - predniSONE (DELTASONE) 10 MG tablet; Take 4 tablets (40 mg total) by mouth daily with breakfast for 3 days, THEN 3 tablets (30 mg total) daily with breakfast for 3 days, THEN 2 tablets (20 mg total) daily with breakfast for 3 days, THEN 1 tablet (10 mg total) daily with breakfast for 3 days.  Dispense: 30 tablet; Refill: 0  Depo 120 today   Prednisone 10mg  tablet  >>>4 tabs for 3 days, then 3 tabs for 3 days, 2 tabs for 3 days, then 1 tab for 3 days, then stop >>>take with food  >>>take in the morning  >>> Start this medication on 02/01/20  Continue Symbicort >>> 2 puffs in the morning right when you wake up, rinse out your mouth after use, 12 hours later 2 puffs, rinse after use >>> Take this daily, no matter what >>> This is not a rescue inhaler   Continue Singulair  Continue Claritin  Start fluticasone nasal spray 1 spray each nostril twice daily  Start nasal saline rinses twice daily Use distilled water Shake well Get bottle lukewarm like a baby bottle  We recommend today:   Meds ordered this encounter  Medications  . predniSONE (DELTASONE) 10 MG tablet    Sig: Take 4 tablets (40 mg total) by mouth daily with breakfast for 3 days, THEN 3 tablets (30 mg total) daily with breakfast for 3 days, THEN 2 tablets (20 mg total) daily with breakfast for 3 days, THEN 1 tablet (10 mg total) daily with breakfast for 3 days.    Dispense:  30 tablet    Refill:  0  . fluticasone (FLONASE) 50 MCG/ACT nasal spray    Sig: Place 1 spray into both nostrils 2 (two) times daily.    Dispense:  16 g    Refill:  3    Follow Up:    Return in about 10 days (around 02/10/2020), or if symptoms worsen or fail to improve, for Follow up with Dr. Loanne Drilling, Follow up with Wyn Quaker FNP-C.   Please do your part to reduce the spread of COVID-19:      Reduce your risk of any infection   and COVID19 by using the similar precautions used for avoiding the common cold or flu:  Marland Kitchen Wash your hands often with soap and warm water for at least 20 seconds.  If soap and water are not readily available, use an alcohol-based hand sanitizer with at least 60% alcohol.  . If coughing or sneezing, cover your mouth and nose by coughing or sneezing into the elbow areas of your shirt or coat, into a tissue or into your sleeve (not your hands). Langley Gauss A MASK when in public  . Avoid shaking hands with others and consider head nods or verbal greetings only. . Avoid touching your eyes, nose, or mouth with unwashed hands.  . Avoid close contact with people who are sick. . Avoid places or events with large numbers of people in one location, like concerts or sporting events. . If you have some symptoms but not all symptoms, continue to monitor at home and seek medical attention if your symptoms worsen. . If you are having a medical emergency, call 911.   ADDITIONAL HEALTHCARE OPTIONS FOR PATIENTS  [facility] / e-Visit: [email or web address]         [facility]: [phone]  [facility]  [facility]: [phone]                   [facility]: [phone]     It is flu season:   >>> Best ways to protect herself from the flu: Receive the yearly flu vaccine, practice good hand hygiene washing with soap and also using hand sanitizer when available, eat a nutritious meals, get adequate rest, hydrate appropriately   Please contact the office if your symptoms worsen or you have concerns that you are not improving.   Thank you for choosing [facility] for your healthcare, and for allowing [place] to partner with you on your healthcare journey. I am thankful to be able to provide care to you today.   Wyn Quaker FNP-C

## 2020-01-31 NOTE — Assessment & Plan Note (Addendum)
Plan: Depo 120 Start prednisone taper tomorrow Tussionex cough medicine today Close follow-up with our office in 10 to 14 days Continue Symbicort 160 Continue [address] Flonase Continue nasal saline rinses Continue Claritin

## 2020-01-31 NOTE — Addendum Note (Signed)
Addended by: Lauraine Rinne on: 01/31/2020 10:21 PM   Modules accepted: Orders

## 2020-01-31 NOTE — Telephone Encounter (Signed)
Pt calling to see when cough med- Tussionex  will be called into pharm. Pharm- [facility]. Can be reached at [phone]

## 2020-01-31 NOTE — Assessment & Plan Note (Signed)
Plan: Continue Linda Ellis

## 2020-01-31 NOTE — Telephone Encounter (Signed)
Please be advised that I have sent in the Tussionex to the [facility] in [place] speaking with the patient tomorrow can we please explained to the patient is very important that she have a limited amount of pharmacies that she uses.  It is very difficult when there is 6-7 pharmacies that are listed as favorites in her profile.  Can we please clarify which pharmacy she would like her medications to go to in the future.  If you are able to remove any of these favorites that would be helpful.Please also contact [facility] to notify them of the Tussionex can be canceled considering a new prescription was sent to the [facility] at patient's Linda Passey, FNP

## 2020-01-31 NOTE — Assessment & Plan Note (Signed)
Plan: Continue Claritin Can continue nasal saline rinses Start Flonase

## 2020-01-31 NOTE — Telephone Encounter (Signed)
Spoke with the pt  She states needing her tussionex rx sent to [facility] in [place]  Pt seen by Aaron Edelman today, 6/30 and med was sent to [facility] in [place]  I tried calling the [facility] pharm to cancel the rx but they are currently closed for a staff meeting  Pt aware that Aaron Edelman is out of the office until tomorrow   Please advise, thanks

## 2020-02-01 NOTE — Telephone Encounter (Signed)
Called [facility] spoke with pharmacist.  Tussionex prescription cancelled. Called and spoke with Patient.  Brian's recommendations given. Patient aware of Tussionex prescription. Pharmacy list updated. Nothing further at this.

## 2020-02-02 ENCOUNTER — Ambulatory Visit (INDEPENDENT_AMBULATORY_CARE_PROVIDER_SITE_OTHER): Payer: Self-pay

## 2020-02-02 ENCOUNTER — Other Ambulatory Visit: Payer: Self-pay

## 2020-02-02 DIAGNOSIS — Z7952 Long term (current) use of systemic steroids: Secondary | ICD-10-CM

## 2020-02-02 DIAGNOSIS — J4550 Severe persistent asthma, uncomplicated: Secondary | ICD-10-CM

## 2020-02-02 MED ORDER — BENRALIZUMAB 30 MG/ML ~~LOC~~ SOSY
30.0000 mg | PREFILLED_SYRINGE | Freq: Once | SUBCUTANEOUS | Status: AC
  Administered 2020-02-02: 30 mg via SUBCUTANEOUS

## 2020-02-02 NOTE — Progress Notes (Signed)
All questions were answered by the patient before medication was administered. Have you been hospitalized in the last 10 days? No Do you have a fever? No Do you have a cough? No Do you have a headache or sore throat? No  

## 2020-02-15 ENCOUNTER — Other Ambulatory Visit: Payer: Self-pay | Admitting: Physician Assistant

## 2020-02-15 DIAGNOSIS — Z7952 Long term (current) use of systemic steroids: Secondary | ICD-10-CM

## 2020-02-16 ENCOUNTER — Ambulatory Visit (INDEPENDENT_AMBULATORY_CARE_PROVIDER_SITE_OTHER): Payer: Self-pay | Admitting: Pulmonary Disease

## 2020-02-16 ENCOUNTER — Other Ambulatory Visit: Payer: Self-pay

## 2020-02-16 ENCOUNTER — Encounter: Payer: Self-pay | Admitting: Pulmonary Disease

## 2020-02-16 VITALS — BP 120/88 | HR 80 | Temp 98.0°F | Wt 325.5 lb

## 2020-02-16 DIAGNOSIS — J309 Allergic rhinitis, unspecified: Secondary | ICD-10-CM

## 2020-02-16 DIAGNOSIS — Z79899 Other long term (current) drug therapy: Secondary | ICD-10-CM

## 2020-02-16 DIAGNOSIS — J4550 Severe persistent asthma, uncomplicated: Secondary | ICD-10-CM

## 2020-02-16 DIAGNOSIS — Z7952 Long term (current) use of systemic steroids: Secondary | ICD-10-CM

## 2020-02-16 MED ORDER — BUDESONIDE-FORMOTEROL FUMARATE 160-4.5 MCG/ACT IN AERO: 2.0000 | INHALATION_SPRAY | Freq: Three times a day (TID) | RESPIRATORY_TRACT | 6 refills | Status: DC

## 2020-02-16 MED ORDER — SPIRIVA RESPIMAT 1.25 MCG/ACT IN AERS
2.0000 | INHALATION_SPRAY | Freq: Every day | RESPIRATORY_TRACT | 0 refills | Status: DC
Start: 2020-02-16 — End: 2020-06-24

## 2020-02-16 NOTE — Assessment & Plan Note (Addendum)
Plan: Increase Symbicort 160 to 2 puffs taken 3 times daily Trial of Spiriva Respimat 1.25 Continue Fasenra Continue Flonase Continue Claritin Continue nasal saline rinses We will discuss with Dr. Loanne Drilling to see if we may need to consider switching your biologic agent

## 2020-02-16 NOTE — Patient Instructions (Addendum)
You were seen today by Lauraine Rinne, NP  for:   1. Severe persistent asthma dependent on systemic steroids  Continue Symbicort 160 >>> 2 puffs in the morning right when you wake up, rinse out your mouth after use, 8 hours later 2 puffs, rinse after use, 8 hours later 2 puffs, rinse after use  >>> Take this daily, no matter what >>> This is not a rescue inhaler   Start Spiriva Respimat 1.25 >>> 2 puffs daily >>> Do this every day >>>This is not a rescue inhaler  Continue Fasenra  2. Medication management  I will discuss with Dr. Loanne Drilling if she wants this could switch her biologic medication from [facility]  3. Allergic rhinitis, unspecified seasonality, unspecified trigger  Continue Flonase   Start nasal saline rinses twice daily Use distilled water Shake well Get bottle lukewarm like a baby bottle   Follow Up:    Return in about 4 weeks (around 03/15/2020), or if symptoms worsen or fail to improve, for Follow up with Dr. Loanne Drilling, Follow up with Wyn Quaker FNP-C.   Please do your part to reduce the spread of COVID-19:      Reduce your risk of any infection  and COVID19 by using the similar precautions used for avoiding the common cold or flu:  Marland Kitchen Wash your hands often with soap and warm water for at least 20 seconds.  If soap and water are not readily available, use an alcohol-based hand sanitizer with at least 60% alcohol.  . If coughing or sneezing, cover your mouth and nose by coughing or sneezing into the elbow areas of your shirt or coat, into a tissue or into your sleeve (not your hands). Langley Gauss A MASK when in public  . Avoid shaking hands with others and consider head nods or verbal greetings only. . Avoid touching your eyes, nose, or mouth with unwashed hands.  . Avoid close contact with people who are sick. . Avoid places or events with large numbers of people in one location, like concerts or sporting events. . If you have some symptoms but not all symptoms,  continue to monitor at home and seek medical attention if your symptoms worsen. . If you are having a medical emergency, call 911.   [place] / e-Visit: [email or web address]         [facility]: [facility]: [phone]                   [facility]: [phone]     It is flu season:   >>> Best ways to protect herself from the flu: Receive the yearly flu vaccine, practice good hand hygiene washing with soap and also using hand sanitizer when available, eat a nutritious meals, get adequate rest, hydrate appropriately   Please contact the office if your symptoms worsen or you have concerns that you are not improving.   Thank you for choosing [facility] for your healthcare, and for allowing [place] to partner with you on your healthcare journey. I am thankful to be able to provide care to you today.   Wyn Quaker FNP-C

## 2020-02-16 NOTE — Addendum Note (Signed)
Addended by: Amado Coe on: 02/16/2020 04:52 PM   Modules accepted: Orders

## 2020-02-16 NOTE — Assessment & Plan Note (Signed)
Plan: Continue Berna Bue  May need to consider switching to [place] or [place] if patient continues to struggle with no improvement despite 6 months of treatment with Berna Bue

## 2020-02-16 NOTE — Assessment & Plan Note (Signed)
Plan: Continue Claritin Continue nasal saline rinses Continue Flonase 

## 2020-02-16 NOTE — Progress Notes (Signed)
@Patient  ID: Linda Ellis, female    DOB: 04-21-1976, 44 y.o.   MRN: [id number]  Chief Complaint  Patient presents with  . Follow-up    the heat has been hard for her.     Referring provider: [name], PA-C  HPI:  44 year old former smoker followed in our office for severe persistent asthma  Past medical history: Morbid obesity, GERD Smoking history: Former smoker.  Quit July/2020.  1 pack/day.  31-pack-year smoking history Maintenance: Symbicort 160 BID, Fasenra Patient of Dr. Loanne Drilling  02/16/2020  - Visit   44 year old female former smoker followed in our office for severe persistent asthma.  Presenting to office today as a follow-up.  She is maintained on Symbicort 160 twice daily as well as Fasenra injections.  She was doing better after last being treated with Depo-Medrol injections as well as a prednisone taper.  She tried to start a new job.  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  She was unable to complete the work.  She had to stop working there in 1.5 days.  Patient feels that she is at baseline right now.  Questionaires / Pulmonary Flowsheets:   ACT:  Asthma Control Test ACT Total Score  01/31/2020 8  01/05/2020 8  11/30/2019 6    MMRC: mMRC Dyspnea Scale mMRC Score  11/30/2019 1    Epworth:  No flowsheet data found.  Tests:   10/17/2018 - CBC with Diff - eos 5, eos absolute 0.5  02/13/2019-IgE-680  02/13/2019-CBC with differential-Eosinophils relative 0.8, eosinophils absolute 0.1 - ? On prednisone  02/08/2019-chest x-ray-chronic bronchial thickening likely related asthma, no acute abnormality  10/09/2019-pulmonary function test-FVC 2.37 (62% predicted), postbronchodilator ratio 64, postbronchodilator FEV1 1.57 (50% predicted), no bronchodilator response, DLCO 22.61 (100% predicted)   FENO:  No results found for: NITRICOXIDE  PFT: PFT Results Latest Ref Rng & Units 10/09/2019  FVC-Pre L 2.37  FVC-Predicted Pre % 62  FVC-Post L 2.46   FVC-Predicted Post % 64  Pre FEV1/FVC % % 62  Post FEV1/FCV % % 64  FEV1-Pre L 1.48  FEV1-Predicted Pre % 47  FEV1-Post L 1.57  DLCO UNC% % 100  DLCO COR %Predicted % 135  TLC L 5.08  TLC % Predicted % 97  RV % Predicted % 170    WALK:  No flowsheet data found.  Imaging: No results found.  Lab Results:  CBC    Component Value Date/Time   WBC 15.3 (H) 02/13/2019 1036   RBC 4.38 02/13/2019 1036   HGB 13.6 02/13/2019 1036   HCT 40.4 02/13/2019 1036   PLT 419.0 (H) 02/13/2019 1036   MCV 92.2 02/13/2019 1036   MCH 29.7 10/17/2018 1234   MCHC 33.5 02/13/2019 1036   RDW 14.0 02/13/2019 1036   LYMPHSABS 5.9 (H) 02/13/2019 1036   MONOABS 0.9 02/13/2019 1036   EOSABS 0.1 02/13/2019 1036   BASOSABS 0.0 02/13/2019 1036    BMET    Component Value Date/Time   NA 139 08/23/2019 1125   K 3.8 08/23/2019 1125   CL 102 08/23/2019 1125   CO2 26 08/23/2019 1125   GLUCOSE 93 08/23/2019 1125   BUN 8 08/23/2019 1125   CREATININE 0.73 08/23/2019 1125   CALCIUM 9.0 08/23/2019 1125   GFRNONAA >60 08/23/2019 1125   GFRAA >60 08/23/2019 1125    BNP    Component Value Date/Time   BNP 23.0 04/09/2018 0118    ProBNP No results found for: PROBNP  Specialty Problems  Pulmonary Problems   Severe persistent asthma dependent on systemic steroids    10/17/2018 - CBC with Diff - eos 5, eos absolute 0.5  02/13/2019-IgE-680  02/13/2019-CBC with differential-Eosinophils relative 0.8, eosinophils absolute 0.1 - ? On prednisone  02/08/2019-chest x-ray-chronic bronchial thickening likely related asthma, no acute abnormality  10/09/2019-pulmonary function test-FVC 2.37 (62% predicted), postbronchodilator ratio 64, postbronchodilator FEV1 1.57 (50% predicted), no bronchodilator response, DLCO 22.61 (100% predicted)       Acute frontal sinusitis   Allergic rhinitis      No Known Allergies  Immunization History  Administered Date(s) Administered  . Influenza,inj,Quad PF,6+ Mos  05/04/2018  . PFIZER SARS-COV-2 Vaccination 11/06/2019, 12/22/2019    Past Medical History:  Diagnosis Date  . Asthma 2008  . COPD (chronic obstructive pulmonary disease) ([place])   . GERD (gastroesophageal reflux disease)   . Multiple gastric ulcers   . Pneumonia     Tobacco History: Social History   Tobacco Use  Smoking Status Former Smoker  . Packs/day: 1.00  . Years: 31.00  . Pack years: 31.00  . Types: Cigarettes  . Start date: 14  . Quit date: 02/24/2019  . Years since quitting: 0.9  Smokeless Tobacco Never Used   Counseling given: Yes   Continue to not smoke  Outpatient Encounter Medications as of 02/16/2020  Medication Sig  . acetaminophen (TYLENOL) 500 MG tablet Take 500-1,000 mg by mouth every 6 (six) hours as needed.  Marland Kitchen Benralizumab (FASENRA [place]) Inject into the skin.  . budesonide-formoterol (SYMBICORT) 160-4.5 MCG/ACT inhaler Inhale 2 puffs into the lungs 2 (two) times daily.  . chlorpheniramine-HYDROcodone (TUSSIONEX PENNKINETIC ER) 10-8 MG/5ML SUER Take 5 mLs by mouth at bedtime as needed for cough.  . Famotidine (PEPCID PO) Take by mouth.  . fluticasone (FLONASE) 50 MCG/ACT nasal spray Place 1 spray into both nostrils 2 (two) times daily.  Marland Kitchen ipratropium-albuterol (DUONEB) 0.5-2.5 (3) MG/3ML SOLN INHALE 1 VIAL VIA NEBULIZER EVERY 6 HOURS AS NEEDED  . loratadine (CLARITIN) 10 MG tablet Take 1 tablet (10 mg total) by mouth daily as needed.  . montelukast (SINGULAIR) 10 MG tablet Take 1 tablet (10 mg total) by mouth at bedtime.  Marland Kitchen omeprazole (PRILOSEC) 40 MG capsule Take 1 capsule (40 mg total) by mouth daily.  Marland Kitchen PROVENTIL HFA 108 (90 Base) MCG/ACT inhaler INHALE 1 TO 2 PUFFS BY MOUTH EVERY 6 HOURS AS NEEDED FOR COUGHING, WHEEZING, OR SHORTNESS OF BREATH  . budesonide-formoterol (SYMBICORT) 160-4.5 MCG/ACT inhaler Inhale 2 puffs into the lungs 3 (three) times daily.  . [DISCONTINUED] ALBUTEROL IN Inhale 2 puffs into the lungs as needed. For shortness of breath    . [DISCONTINUED] amoxicillin-clavulanate (AUGMENTIN) 875-125 MG tablet Take 1 tablet by mouth 2 (two) times daily. (Patient not taking: Reported on 01/31/2020)  . [DISCONTINUED] predniSONE (DELTASONE) 5 MG tablet Take 1 tablet (5 mg total) by mouth daily with breakfast. (Patient not taking: Reported on 01/31/2020)  . [DISCONTINUED] promethazine (PHENERGAN) 25 MG tablet Take 1 tablet (25 mg total) by mouth every 8 (eight) hours as needed for nausea or vomiting.   No facility-administered encounter medications on file as of 02/16/2020.     Review of Systems  Review of Systems  Constitutional: Positive for activity change. Negative for fatigue and fever.  HENT: Positive for congestion, postnasal drip and rhinorrhea. Negative for sinus pressure, sinus pain and sore throat.   Respiratory: Positive for cough. Negative for shortness of breath and wheezing.   Cardiovascular: Negative for chest pain and palpitations.  Gastrointestinal: Negative for diarrhea, nausea and vomiting.  Musculoskeletal: Negative for arthralgias.  Neurological: Negative for dizziness.  Psychiatric/Behavioral: Negative for sleep disturbance. The patient is not nervous/anxious.      Physical Exam  BP 120/88   Pulse 80   Temp 98 F (36.7 C) (Oral)   Wt (!) 325 lb 8 oz (147.6 kg)   SpO2 96%   BMI 54.17 kg/m   Wt Readings from Last 5 Encounters:  02/16/20 (!) 325 lb 8 oz (147.6 kg)  01/31/20 (!) 320 lb (145.2 kg)  01/05/20 (!) 318 lb 12.8 oz (144.6 kg)  11/30/19 (!) 310 lb (140.6 kg)  10/31/19 (!) 307 lb 3.2 oz (139.3 kg)    BMI Readings from Last 5 Encounters:  02/16/20 54.17 kg/m  01/31/20 53.25 kg/m  01/05/20 53.05 kg/m  11/30/19 51.59 kg/m  10/31/19 51.12 kg/m     Physical Exam Vitals and nursing note reviewed.  Constitutional:      General: She is not in acute distress.    Appearance: Normal appearance. She is obese.  HENT:     Head: Normocephalic and atraumatic.     Right Ear: Tympanic  membrane, ear canal and external ear normal. There is no impacted cerumen.     Left Ear: Tympanic membrane, ear canal and external ear normal. There is no impacted cerumen.     Nose: Congestion and rhinorrhea present.     Mouth/Throat:     Mouth: Mucous membranes are moist.     Pharynx: Oropharynx is clear.  Eyes:     Pupils: Pupils are equal, round, and reactive to light.  Cardiovascular:     Rate and Rhythm: Normal rate and regular rhythm.     Pulses: Normal pulses.     Heart sounds: Normal heart sounds. No murmur heard.   Pulmonary:     Effort: Pulmonary effort is normal. No respiratory distress.     Breath sounds: No decreased air movement. Wheezing (slight exp wheeze) present. No decreased breath sounds or rales.  Musculoskeletal:     Cervical back: Normal range of motion.  Skin:    General: Skin is warm and dry.     Capillary Refill: Capillary refill takes less than 2 seconds.  Neurological:     General: No focal deficit present.     Mental Status: She is alert and oriented to person, place, and time. Mental status is at baseline.     Gait: Gait normal.  Psychiatric:        Mood and Affect: Mood normal.        Behavior: Behavior normal.        Thought Content: Thought content normal.        Judgment: Judgment normal.       Assessment & Plan:   Allergic rhinitis Plan: Continue Claritin Continue nasal saline rinses Continue Flonase  Severe persistent asthma dependent on systemic steroids Plan: Increase Symbicort 160 to 2 puffs taken 3 times daily Trial of Spiriva Respimat 1.25 Continue Fasenra Continue Flonase Continue Claritin Continue nasal saline rinses We will discuss with Dr. Loanne Drilling to see if we may need to consider switching your biologic agent  Medication management Plan: Continue Berna Bue  May need to consider switching to [place] or [place] if patient continues to struggle with no improvement despite 6 months of treatment with  Berna Bue    Return in about 4 weeks (around 03/15/2020), or if symptoms worsen or fail to improve, for Follow up with Dr. Loanne Drilling, Follow up with Aaron Edelman  Regis Hinton FNP-C.   Lauraine Rinne, NP 02/16/2020   This appointment required 32 minutes of patient care (this includes precharting, chart review, review of results, face-to-face care, etc.).

## 2020-03-09 IMAGING — CR DG CHEST 1V PORT
1 series · 1 of 1 positions shown · non-contrast
Comparison: Chest radiograph June 10, 2018 and priors.

CLINICAL DATA: Shortness of breath, cough and congestion. History
of asthma/COPD.

EXAM:
PORTABLE CHEST 1 VIEW

[portable]
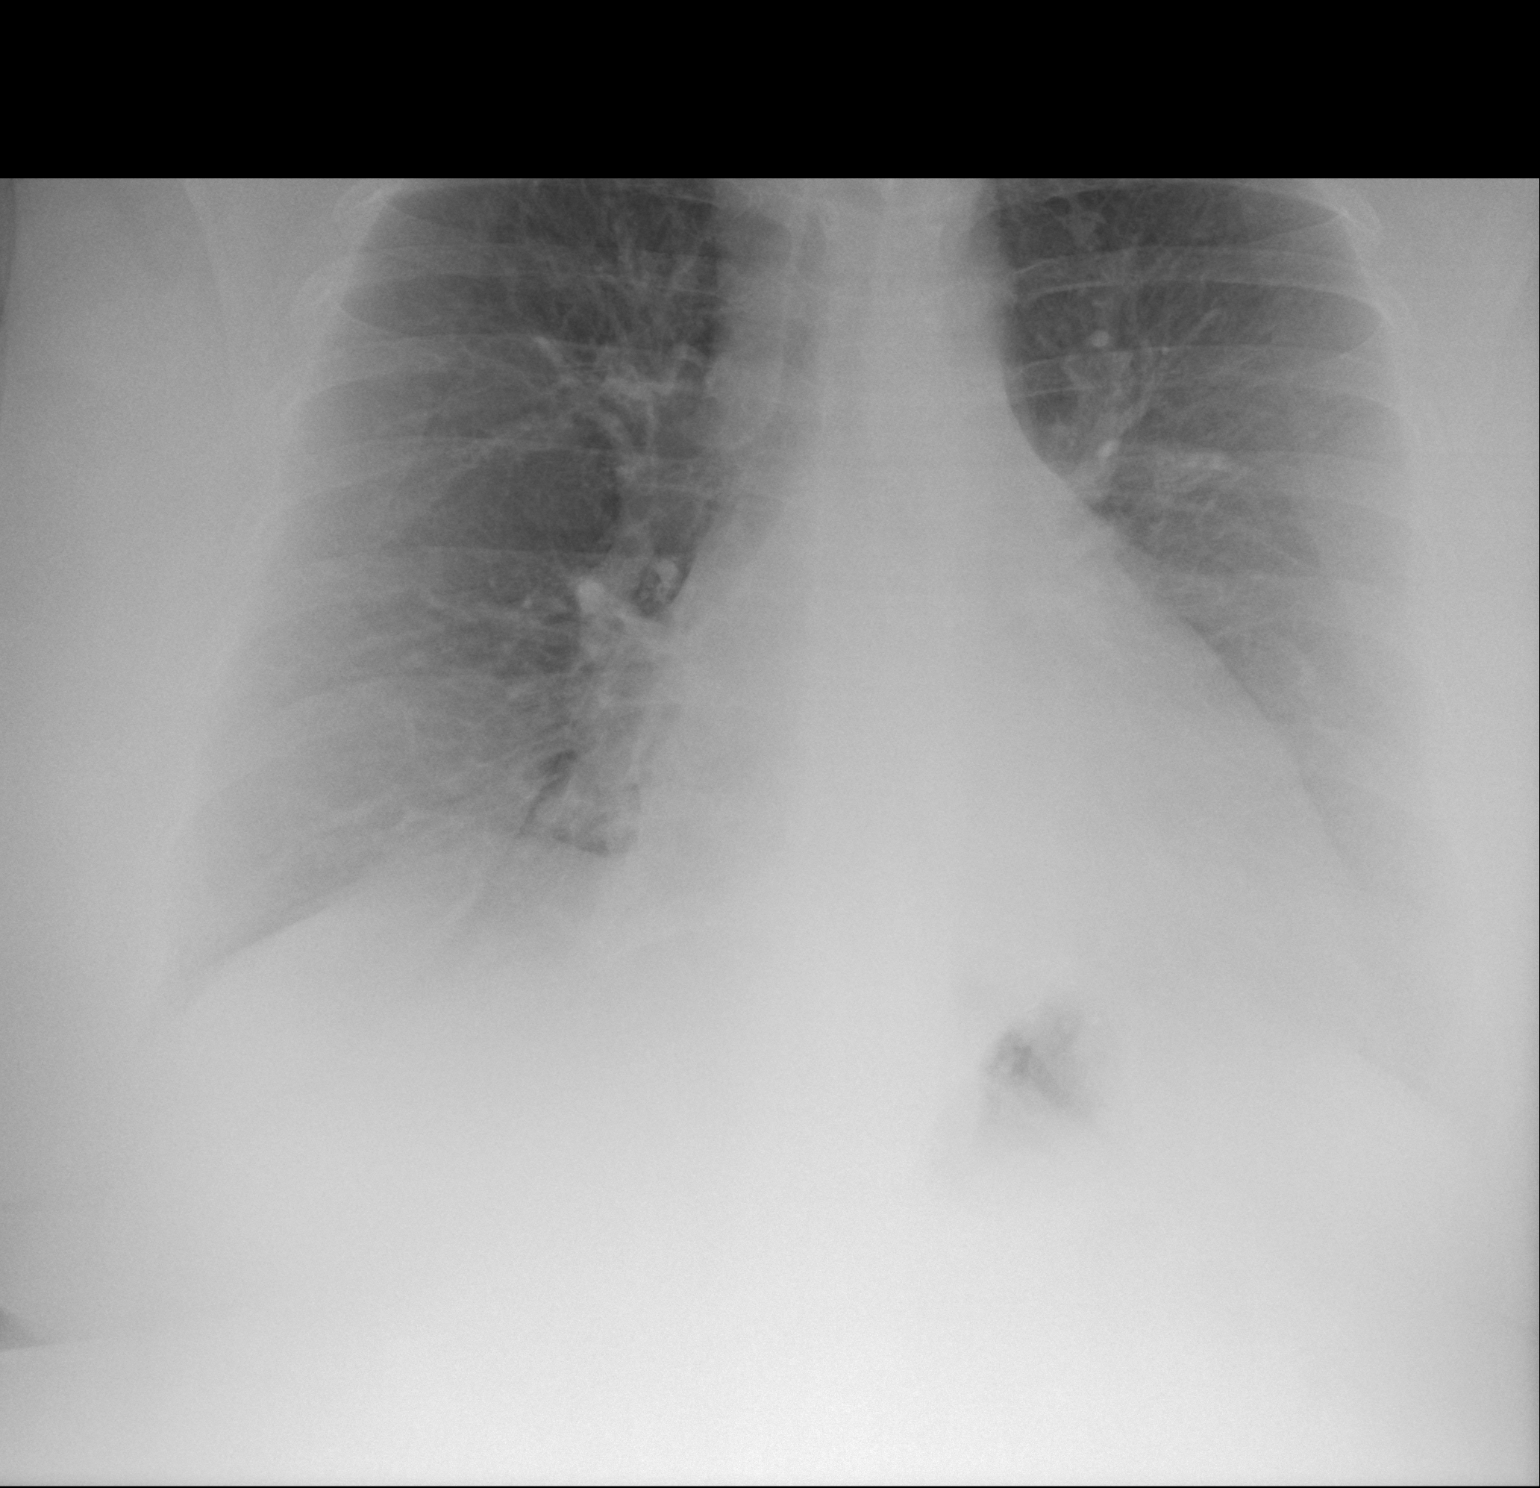

[1 of 1 positions shown; findings below may reference images not displayed]

FINDINGS: The cardiac silhouette is mildly enlarged and unchanged. Increased
bronchitic changes without pleural effusion or focal consolidation.
No pneumothorax. Large body habitus. Osseous structures are non
suspicious.
IMPRESSION: 1. Increased bronchitic changes.
2. Mild cardiomegaly.

## 2020-03-11 ENCOUNTER — Other Ambulatory Visit: Payer: Self-pay | Admitting: Physician Assistant

## 2020-03-11 ENCOUNTER — Telehealth: Payer: Self-pay | Admitting: Student

## 2020-03-11 MED ORDER — PROMETHAZINE HCL 25 MG PO TABS
25.0000 mg | ORAL_TABLET | Freq: Three times a day (TID) | ORAL | 0 refills | Status: DC | PRN
Start: 2020-03-11 — End: 2021-05-12

## 2020-03-11 NOTE — Telephone Encounter (Signed)
Pt called requeting refill for promethazine sent to [facility] in [place] on [address]. Pt states she has been nauseous, vomitting, and having abd pain since last week.  PA agrees to send rx to pt's pharmacy. Pt is to call the office to schedule appt if no improvements or if she experiences any new symptoms. Pt was notified and Pt verbalized understanding.

## 2020-03-19 ENCOUNTER — Ambulatory Visit: Payer: Medicaid Other | Admitting: Pulmonary Disease

## 2020-03-19 ENCOUNTER — Encounter: Payer: Self-pay | Admitting: Pulmonary Disease

## 2020-03-19 ENCOUNTER — Other Ambulatory Visit: Payer: Self-pay

## 2020-03-19 ENCOUNTER — Encounter (INDEPENDENT_AMBULATORY_CARE_PROVIDER_SITE_OTHER): Payer: Self-pay

## 2020-03-19 VITALS — BP 108/72 | HR 96 | Temp 97.3°F | Ht 65.0 in | Wt 322.8 lb

## 2020-03-19 DIAGNOSIS — Z6841 Body Mass Index (BMI) 40.0 and over, adult: Secondary | ICD-10-CM

## 2020-03-19 DIAGNOSIS — J4550 Severe persistent asthma, uncomplicated: Secondary | ICD-10-CM

## 2020-03-19 DIAGNOSIS — J309 Allergic rhinitis, unspecified: Secondary | ICD-10-CM

## 2020-03-19 DIAGNOSIS — Z7952 Long term (current) use of systemic steroids: Secondary | ICD-10-CM

## 2020-03-19 MED ORDER — PREDNISONE 10 MG PO TABS: ORAL_TABLET | ORAL | 0 refills | Status: DC

## 2020-03-19 MED ORDER — METHYLPREDNISOLONE ACETATE 80 MG/ML IJ SUSP
80.0000 mg | Freq: Once | INTRAMUSCULAR | Status: AC
  Administered 2020-03-19: 80 mg via INTRAMUSCULAR

## 2020-03-19 NOTE — Assessment & Plan Note (Signed)
Plan: Continue Claritin Continue nasal saline rinses Continue Flonase

## 2020-03-19 NOTE — Assessment & Plan Note (Signed)
Plan: Depo 80 today Prednisone taper starting tomorrow Continue Fasenra Continue Symbicort 3 times daily We will coordinate follow-up with Dr. Loanne Drilling or myself in 4 to 6 weeks

## 2020-03-19 NOTE — Progress Notes (Signed)
@Patient  ID: Linda Ellis, female    DOB: [birth year], 44 y.o.   MRN: [id number]  Chief Complaint  Patient presents with   Follow-up    asthma     Referring provider: [name], PA-C  HPI:  44 year old former smoker followed in our office for severe persistent asthma  Past medical history: Morbid obesity, GERD Smoking history: Former smoker.  Quit July/2020. 31-pack-year smoking history Maintenance: Symbicort 160 TID, Fasenra Patient of Dr. 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  Patient presenting today as a follow-up.  She is reporting increased rescue inhaler use as well as audible wheezing.  Patient is a former smoker.  She is currently maintained on Symbicort 160 3 times daily, Fasenra.  Patient feels that her breathing has worsened.  We will discuss and evaluate this today.  Patient is followed in our office by Dr. Loanne Drilling.  Questionaires / Pulmonary Flowsheets:   ACT:  Asthma Control Test ACT Total Score  03/19/2020 8  01/31/2020 8  01/05/2020 8   MMRC: mMRC Dyspnea Scale mMRC Score  11/30/2019 1    Epworth:  No flowsheet data found.  Tests:   10/17/2018 - CBC with Diff - eos 5, eos absolute 0.5  02/13/2019-IgE-680  02/13/2019-CBC with differential-Eosinophils relative 0.8, eosinophils absolute 0.1 - ? On prednisone  02/08/2019-chest x-ray-chronic bronchial thickening likely related asthma, no acute abnormality  10/09/2019-pulmonary function test-FVC 2.37 (62% predicted), postbronchodilator ratio 64, postbronchodilator FEV1 1.57 (50% predicted), no bronchodilator response, DLCO 22.61 (100% predicted)  FENO:  No results found for: NITRICOXIDE  PFT: PFT Results Latest Ref Rng & Units 10/09/2019  FVC-Pre L 2.37  FVC-Predicted Pre % 62  FVC-Post L 2.46  FVC-Predicted Post % 64  Pre FEV1/FVC % % 62  Post FEV1/FCV % % 64  FEV1-Pre L 1.48  FEV1-Predicted Pre % 47   FEV1-Post L 1.57  DLCO uncorrected ml/min/mmHg 22.61  DLCO UNC% % 100  DLCO corrected ml/min/mmHg 24.64  DLCO COR %Predicted % 109  DLVA Predicted % 135  TLC L 5.08  TLC % Predicted % 97  RV % Predicted % 170    WALK:  No flowsheet data found.  Imaging: No results found.  Lab Results:  CBC    Component Value Date/Time   WBC 15.3 (H) 02/13/2019 1036   RBC 4.38 02/13/2019 1036   HGB 13.6 02/13/2019 1036   HCT 40.4 02/13/2019 1036   PLT 419.0 (H) 02/13/2019 1036   MCV 92.2 02/13/2019 1036   MCH 29.7 10/17/2018 1234   MCHC 33.5 02/13/2019 1036   RDW 14.0 02/13/2019 1036   LYMPHSABS 5.9 (H) 02/13/2019 1036   MONOABS 0.9 02/13/2019 1036   EOSABS 0.1 02/13/2019 1036   BASOSABS 0.0 02/13/2019 1036    BMET    Component Value Date/Time   NA 139 08/23/2019 1125   K 3.8 08/23/2019 1125   CL 102 08/23/2019 1125   CO2 26 08/23/2019 1125   GLUCOSE 93 08/23/2019 1125   BUN 8 08/23/2019 1125   CREATININE 0.73 08/23/2019 1125   CALCIUM 9.0 08/23/2019 1125   GFRNONAA >60 08/23/2019 1125   GFRAA >60 08/23/2019 1125    BNP    Component Value Date/Time   BNP 23.0 04/09/2018 0118    ProBNP No results found for: PROBNP  Specialty Problems      Pulmonary Problems   Severe persistent asthma dependent on systemic steroids  10/17/2018 - CBC with Diff - eos 5, eos absolute 0.5  02/13/2019-IgE-680  02/13/2019-CBC with differential-Eosinophils relative 0.8, eosinophils absolute 0.1 - ? On prednisone  02/08/2019-chest x-ray-chronic bronchial thickening likely related asthma, no acute abnormality  10/09/2019-pulmonary function test-FVC 2.37 (62% predicted), postbronchodilator ratio 64, postbronchodilator FEV1 1.57 (50% predicted), no bronchodilator response, DLCO 22.61 (100% predicted)       Asthma   Acute frontal sinusitis   Allergic rhinitis      No Known Allergies  Immunization History  Administered Date(s) Administered   Influenza,inj,Quad PF,6+ Mos 05/04/2018    PFIZER SARS-COV-2 Vaccination 11/06/2019, 12/22/2019    Past Medical History:  Diagnosis Date   Asthma 2008   COPD (chronic obstructive pulmonary disease) (HCC)    GERD (gastroesophageal reflux disease)    Multiple gastric ulcers    Pneumonia     Tobacco History: Social History   Tobacco Use  Smoking Status Former Smoker   Packs/day: 1.00   Years: 31.00   Pack years: 31.00   Types: Cigarettes   Start date: 1989   Quit date: 02/24/2019   Years since quitting: 1.0  Smokeless Tobacco Never Used   Counseling given: Yes   Continue to not smoke    Outpatient Encounter Medications as of 03/19/2020  Medication Sig   acetaminophen (TYLENOL) 500 MG tablet Take 500-1,000 mg by mouth every 6 (six) hours as needed.   Benralizumab (FASENRA [place]) Inject into the skin.   budesonide-formoterol (SYMBICORT) 160-4.5 MCG/ACT inhaler Inhale 2 puffs into the lungs 3 (three) times daily.   chlorpheniramine-HYDROcodone (TUSSIONEX PENNKINETIC ER) 10-8 MG/5ML SUER Take 5 mLs by mouth at bedtime as needed for cough.   Famotidine (PEPCID PO) Take by mouth.   fluticasone (FLONASE) 50 MCG/ACT nasal spray Place 1 spray into both nostrils 2 (two) times daily.   ipratropium-albuterol (DUONEB) 0.5-2.5 (3) MG/3ML SOLN INHALE 1 VIAL VIA NEBULIZER EVERY 6 HOURS AS NEEDED   loratadine (CLARITIN) 10 MG tablet Take 1 tablet (10 mg total) by mouth daily as needed.   montelukast (SINGULAIR) 10 MG tablet Take 1 tablet (10 mg total) by mouth at bedtime.   omeprazole (PRILOSEC) 40 MG capsule Take 1 capsule (40 mg total) by mouth daily.   promethazine (PHENERGAN) 25 MG tablet Take 1 tablet (25 mg total) by mouth every 8 (eight) hours as needed for nausea or vomiting.   PROVENTIL HFA 108 (90 Base) MCG/ACT inhaler INHALE 1 TO 2 PUFFS BY MOUTH EVERY 6 HOURS AS NEEDED FOR COUGHING, WHEEZING, OR SHORTNESS OF BREATH   Tiotropium Bromide Monohydrate (SPIRIVA RESPIMAT) 1.25 MCG/ACT AERS Inhale 2  puffs into the lungs daily.   predniSONE (DELTASONE) 10 MG tablet 4 tabs for 2 days, then 3 tabs for 2 days, 2 tabs for 2 days, then 1 tab for 2 days, then stop   [DISCONTINUED] ALBUTEROL IN Inhale 2 puffs into the lungs as needed. For shortness of breath    [DISCONTINUED] budesonide-formoterol (SYMBICORT) 160-4.5 MCG/ACT inhaler Inhale 2 puffs into the lungs 2 (two) times daily.   [EXPIRED] methylPREDNISolone acetate (DEPO-MEDROL) injection 80 mg    No facility-administered encounter medications on file as of 03/19/2020.     Review of Systems  Review of Systems  Constitutional: Positive for fatigue. Negative for activity change and fever.  HENT: Negative for sinus pressure, sinus pain and sore throat.   Respiratory: Positive for cough, shortness of breath and wheezing.   Cardiovascular: Negative for chest pain and palpitations.  Gastrointestinal: Negative for diarrhea, nausea and vomiting.  Musculoskeletal: Negative for arthralgias.  Neurological: Negative for dizziness.  Psychiatric/Behavioral: Negative for sleep disturbance. The patient is not nervous/anxious.      Physical Exam  BP 108/72 (BP Location: Left Arm, Cuff Size: Normal)    Pulse 96    Temp (!) 97.3 F (36.3 C) (Temporal)    Ht 5\' 5"  (1.651 m)    Wt (!) 322 lb 12.8 oz (146.4 kg)    SpO2 96%    BMI 53.72 kg/m   Wt Readings from Last 5 Encounters:  03/19/20 (!) 322 lb 12.8 oz (146.4 kg)  02/16/20 (!) 325 lb 8 oz (147.6 kg)  01/31/20 (!) 320 lb (145.2 kg)  01/05/20 (!) 318 lb 12.8 oz (144.6 kg)  11/30/19 (!) 310 lb (140.6 kg)    BMI Readings from Last 5 Encounters:  03/19/20 53.72 kg/m  02/16/20 54.17 kg/m  01/31/20 53.25 kg/m  01/05/20 53.05 kg/m  11/30/19 51.59 kg/m     Physical Exam Vitals and nursing note reviewed.  Constitutional:      General: She is not in acute distress.    Appearance: Normal appearance. She is obese.  HENT:     Head: Normocephalic and atraumatic.     Right Ear: Tympanic  membrane, ear canal and external ear normal. There is no impacted cerumen.     Left Ear: Tympanic membrane, ear canal and external ear normal. There is no impacted cerumen.     Nose: Nose normal. No congestion.     Mouth/Throat:     Mouth: Mucous membranes are moist.     Pharynx: Oropharynx is clear.  Eyes:     Pupils: Pupils are equal, round, and reactive to light.  Cardiovascular:     Rate and Rhythm: Normal rate and regular rhythm.     Pulses: Normal pulses.     Heart sounds: Normal heart sounds. No murmur heard.   Pulmonary:     Effort: Pulmonary effort is normal. No respiratory distress.     Breath sounds: No decreased air movement. Wheezing present. No decreased breath sounds or rales.  Musculoskeletal:     Cervical back: Normal range of motion.  Skin:    General: Skin is warm and dry.     Capillary Refill: Capillary refill takes less than 2 seconds.  Neurological:     General: No focal deficit present.     Mental Status: She is alert and oriented to person, place, and time. Mental status is at baseline.     Gait: Gait normal.  Psychiatric:        Mood and Affect: Mood normal.        Behavior: Behavior normal.        Thought Content: Thought content normal.        Judgment: Judgment normal.       Assessment & Plan:   Severe persistent asthma dependent on systemic steroids Plan: Depo 80 today Prednisone taper starting tomorrow Continue Fasenra Continue Symbicort 3 times daily We will coordinate follow-up with Dr. Loanne Drilling or myself in 4 to 6 weeks   Allergic rhinitis Plan: Continue Claritin Continue nasal saline rinses Continue Flonase  Obesity Plan: Need to work on reducing BMI overall Referral to medical weight management Weight loss goal of 10 to 20% could be the equivalent of adding a new inhaler    Return in about 6 weeks (around 04/30/2020), or if symptoms worsen or fail to improve, for Follow up with Dr. Loanne Drilling.   Lauraine Rinne,  NP 03/19/2020  This appointment required 32 minutes of patient care (this includes precharting, chart review, review of results, face-to-face care, etc.).

## 2020-03-19 NOTE — Assessment & Plan Note (Signed)
Plan: Need to work on reducing BMI overall Referral to medical weight management Weight loss goal of 10 to 20% could be the equivalent of adding a new inhaler

## 2020-03-19 NOTE — Patient Instructions (Addendum)
You were seen today by Lauraine Rinne, NP  for:   1. Severe persistent asthma dependent on systemic steroids  Depo 80 today   Prednisone 10mg  tablet  >>>4 tabs for 2 days, then 3 tabs for 2 days, 2 tabs for 2 days, then 1 tab for 2 days, then stop >>>take with food  >>>take in the morning  >>>start 03/20/20  Continue Symbicort 160 >>> 2 puffs in the morning right when you wake up, rinse out your mouth after use, 8 hours later 2 puffs, rinse after use, 8 hours later 2 puffs, rinse after use  >>> Take this daily, no matter what >>> This is not a rescue inhaler   2. Allergic rhinitis, unspecified seasonality, unspecified trigger  Continue Claritin daily Continue Singulair daily Continue Flonase Can always use nasal saline rinses 1 or 2 times a day  3. Class 3 severe obesity with serious comorbidity and body mass index (BMI) of 60.0 to 69.9 in adult, unspecified obesity type ([place])  - Amb Ref to Medical Weight Management   We recommend today:  Orders Placed This Encounter  Procedures  . Amb Ref to Medical Weight Management    Referral Priority:   Routine    Referral Type:   Consultation    Number of Visits Requested:   1   Orders Placed This Encounter  Procedures  . Amb Ref to Medical Weight Management   Meds ordered this encounter  Medications  . predniSONE (DELTASONE) 10 MG tablet    Sig: 4 tabs for 2 days, then 3 tabs for 2 days, 2 tabs for 2 days, then 1 tab for 2 days, then stop    Dispense:  20 tablet    Refill:  0    Follow Up:    Return in about 6 weeks (around 04/30/2020), or if symptoms worsen or fail to improve, for Follow up with Dr. Loanne Drilling.   Please do your part to reduce the spread of COVID-19:      Reduce your risk of any infection  and COVID19 by using the similar precautions used for avoiding the common cold or flu:  Marland Kitchen Wash your hands often with soap and warm water for at least 20 seconds.  If soap and water are not readily available, use an  alcohol-based hand sanitizer with at least 60% alcohol.  . If coughing or sneezing, cover your mouth and nose by coughing or sneezing into the elbow areas of your shirt or coat, into a tissue or into your sleeve (not your hands). Langley Gauss A MASK when in public  . Avoid shaking hands with others and consider head nods or verbal greetings only. . Avoid touching your eyes, nose, or mouth with unwashed hands.  . Avoid close contact with people who are sick. . Avoid places or events with large numbers of people in one location, like concerts or sporting events. . If you have some symptoms but not all symptoms, continue to monitor at home and seek medical attention if your symptoms worsen. . If you are having a medical emergency, call 911.   [place] / e-Visit: [email or web address]         [facility]: [phone]  [facility]: [phone]                   [facility]: [phone]     It is flu season:   >>> Best ways to  protect herself from the flu: Receive the yearly flu vaccine, practice good hand hygiene washing with soap and also using hand sanitizer when available, eat a nutritious meals, get adequate rest, hydrate appropriately   Please contact the office if your symptoms worsen or you have concerns that you are not improving.   Thank you for choosing [facility] for your healthcare, and for allowing [place] to partner with you on your healthcare journey. I am thankful to be able to provide care to you today.   Wyn Quaker FNP-C

## 2020-03-25 ENCOUNTER — Other Ambulatory Visit: Payer: Self-pay

## 2020-03-25 ENCOUNTER — Other Ambulatory Visit: Payer: Self-pay | Admitting: *Deleted

## 2020-03-25 ENCOUNTER — Encounter: Payer: Self-pay | Admitting: Physician Assistant

## 2020-03-25 ENCOUNTER — Ambulatory Visit: Payer: Medicaid Other | Admitting: Physician Assistant

## 2020-03-25 VITALS — BP 102/60 | HR 109 | Temp 98.1°F | Ht 65.0 in | Wt 328.0 lb

## 2020-03-25 DIAGNOSIS — K219 Gastro-esophageal reflux disease without esophagitis: Secondary | ICD-10-CM

## 2020-03-25 DIAGNOSIS — J45909 Unspecified asthma, uncomplicated: Secondary | ICD-10-CM

## 2020-03-25 DIAGNOSIS — R1030 Lower abdominal pain, unspecified: Secondary | ICD-10-CM

## 2020-03-25 MED ORDER — FASENRA 30 MG/ML ~~LOC~~ SOSY
30.0000 mg | PREFILLED_SYRINGE | SUBCUTANEOUS | 5 refills | Status: DC
Start: 2020-03-25 — End: 2020-06-13

## 2020-03-25 MED ORDER — PANTOPRAZOLE SODIUM 40 MG PO TBEC: 40.0000 mg | DELAYED_RELEASE_TABLET | Freq: Two times a day (BID) | ORAL | 3 refills | Status: DC

## 2020-03-25 NOTE — Progress Notes (Signed)
BP 102/60    Pulse (!) 109    Temp 98.1 F (36.7 C)    Ht 5\' 5"  (1.651 m)    Wt (!) 328 lb (148.8 kg)    SpO2 96%    BMI 54.58 kg/m    Subjective:    Patient ID: Linda Ellis, female    DOB: 1975-12-09, 44 y.o.   MRN: [id number]  HPI: Linda Ellis is a 44 y.o. female presenting on 03/25/2020 for Abdominal Pain   HPI  Pt had a negative covid 19 screening questionnaire.    Pt is 75yoF with significant asthma for which she sees pulmonologist.  She is in today reporting abdominal pain x 3 weeks.   Some days it comes and goes, other days it's all days.  She thinks it's gas and she has been taking OTC gas pills but it doesn't seem to be helping.  She is belching and farting a lot.  She says it feels a bit like when she had her gall stones.   She is s/p cholecystectomy.  She has recently started eating healthier and stopped eating fried foods and drinking sodas.  She is eating more vegetables, her favorites are broccoli and corn.    Sometimes she feels like she gets food stuck.  She says it's not when she's eating that she gets the feeling that like there is something stuck in her throat.  She does not have difficulty swallowing.  She's also been having more burning lower, epigastric and just lower.   Pt had abd pain earlier in the year and had CT in February.  Pt states that now is the longest it has bothered her.     Pt had cholecystectomy in 2000.    BMs normal and regular.  No emesis.     Relevant past medical, surgical, family and social history reviewed and updated as indicated. Interim medical history since our last visit reviewed. Allergies and medications reviewed and updated.    Current Outpatient Medications:    acetaminophen (TYLENOL) 500 MG tablet, Take 500-1,000 mg by mouth every 6 (six) hours as needed., Disp: , Rfl:    alum & mag hydroxide-simeth (MAALOX/MYLANTA) 200-200-20 MG/5ML suspension, Take by mouth 2 (two) times daily as needed for indigestion or  heartburn., Disp: , Rfl:    Benralizumab (FASENRA [place]), Inject into the skin. Every 2 months, Disp: , Rfl:    budesonide-formoterol (SYMBICORT) 160-4.5 MCG/ACT inhaler, Inhale 2 puffs into the lungs 3 (three) times daily., Disp: 1 Inhaler, Rfl: 6   chlorpheniramine-HYDROcodone (TUSSIONEX PENNKINETIC ER) 10-8 MG/5ML SUER, Take 5 mLs by mouth at bedtime as needed for cough., Disp: 140 mL, Rfl: 0   Famotidine (PEPCID PO), Take 1 tablet by mouth daily. , Disp: , Rfl:    fluticasone (FLONASE) 50 MCG/ACT nasal spray, Place 1 spray into both nostrils 2 (two) times daily., Disp: 16 g, Rfl: 3   ipratropium-albuterol (DUONEB) 0.5-2.5 (3) MG/3ML SOLN, INHALE 1 VIAL VIA NEBULIZER EVERY 6 HOURS AS NEEDED, Disp: 360 mL, Rfl: 1   loratadine (CLARITIN) 10 MG tablet, Take 1 tablet (10 mg total) by mouth daily as needed. (Patient taking differently: Take 10 mg by mouth daily. ), Disp: 90 tablet, Rfl: 1   montelukast (SINGULAIR) 10 MG tablet, Take 1 tablet (10 mg total) by mouth at bedtime., Disp: 90 tablet, Rfl: 1   omeprazole (PRILOSEC) 40 MG capsule, Take 1 capsule (40 mg total) by mouth daily., Disp: 90 capsule, Rfl: 1   predniSONE (  DELTASONE) 10 MG tablet, 4 tabs for 2 days, then 3 tabs for 2 days, 2 tabs for 2 days, then 1 tab for 2 days, then stop, Disp: 20 tablet, Rfl: 0   promethazine (PHENERGAN) 25 MG tablet, Take 1 tablet (25 mg total) by mouth every 8 (eight) hours as needed for nausea or vomiting., Disp: 20 tablet, Rfl: 0   PROVENTIL HFA 108 (90 Base) MCG/ACT inhaler, INHALE 1 TO 2 PUFFS BY MOUTH EVERY 6 HOURS AS NEEDED FOR COUGHING, WHEEZING, OR SHORTNESS OF BREATH, Disp: 20.1 g, Rfl: prn   Tiotropium Bromide Monohydrate (SPIRIVA RESPIMAT) 1.25 MCG/ACT AERS, Inhale 2 puffs into the lungs daily., Disp: 4 g, Rfl: 0   UNKNOWN TO PATIENT, OTC Gas medication, Disp: , Rfl:    Review of Systems  Per HPI unless specifically indicated above     Objective:    BP 102/60    Pulse (!) 109    Temp  98.1 F (36.7 C)    Ht 5\' 5"  (1.651 m)    Wt (!) 328 lb (148.8 kg)    SpO2 96%    BMI 54.58 kg/m   Wt Readings from Last 3 Encounters:  03/25/20 (!) 328 lb (148.8 kg)  03/19/20 (!) 322 lb 12.8 oz (146.4 kg)  02/16/20 (!) 325 lb 8 oz (147.6 kg)    Physical Exam Vitals reviewed.  Constitutional:      General: She is not in acute distress.    Appearance: She is well-developed. She is obese. She is not ill-appearing.  HENT:     Head: Normocephalic and atraumatic.  Cardiovascular:     Rate and Rhythm: Normal rate and regular rhythm.  Pulmonary:     Effort: Pulmonary effort is normal.     Breath sounds: Normal breath sounds.  Abdominal:     General: Bowel sounds are normal.     Palpations: Abdomen is soft. There is no mass.     Tenderness: There is abdominal tenderness. There is no guarding or rebound.     Comments: Mild generalized tenderness.  No rebound or guarding. abd obese but no masses palpable.   Musculoskeletal:     Cervical back: Neck supple.     Right lower leg: No edema.     Left lower leg: No edema.  Lymphadenopathy:     Cervical: No cervical adenopathy.  Skin:    General: Skin is warm and dry.  Neurological:     Mental Status: She is alert and oriented to person, place, and time.  Psychiatric:        Behavior: Behavior normal.            Assessment & Plan:   Encounter Diagnoses  Name Primary?   Gastroesophageal reflux disease, unspecified whether esophagitis present Yes   Lower abdominal pain    Morbid obesity (HCC)    Asthma, unspecified asthma severity, unspecified whether complicated, unspecified whether persistent       -Discontinue omeprazole and start pantoprazole bid.  She is counseled to minimize raw veggies for now as this may be contributing to her gas.  Continue to avoid sodas.  OTC simethicone okay to use.   Avoid spicy and greasy foods.  -pt has follow up scheduled in 1 month already.  She is to contact office sooner for worsening or  new symptoms

## 2020-03-25 NOTE — Patient Instructions (Signed)
Abdominal Bloating When you have abdominal bloating, your abdomen may feel full, tight, or painful. It may also look bigger than normal or swollen (distended). Common causes of abdominal bloating include:  Swallowing air.  Constipation.  Problems digesting food.  Eating too much.  Irritable bowel syndrome. This is a condition that affects the large intestine.  Lactose intolerance. This is an inability to digest lactose, a natural sugar in dairy products.  Celiac disease. This is a condition that affects the ability to digest gluten, a protein found in some grains.  Gastroparesis. This is a condition that slows down the movement of food in the stomach and small intestine. It is more common in people with diabetes mellitus.  Gastroesophageal reflux disease (GERD). This is a digestive condition that makes stomach acid flow back into the esophagus.  Urinary retention. This means that the body is holding onto urine, and the bladder cannot be emptied all the way. Follow these instructions at home: Eating and drinking  Avoid eating too much.  Try not to swallow air while talking or eating.  Avoid eating while lying down.  Avoid these foods and drinks: ? Foods that cause gas, such as broccoli, cabbage, cauliflower, and baked beans. ? Carbonated drinks. ? Hard candy. ? Chewing gum. Medicines  Take over-the-counter and prescription medicines only as told by your health care provider.  Take probiotic medicines. These medicines contain live bacteria or yeasts that can help digestion.  Take coated peppermint oil capsules. Activity  Try to exercise regularly. Exercise may help to relieve bloating that is caused by gas and relieve constipation. General instructions  Keep all follow-up visits as told by your health care provider. This is important. Contact a health care provider if:  You have nausea and vomiting.  You have diarrhea.  You have abdominal pain.  You have unusual  weight loss or weight gain.  You have severe pain, and medicines do not help. Get help right away if:  You have severe chest pain.  You have trouble breathing.  You have shortness of breath.  You have trouble urinating.  You have darker urine than normal.  You have blood in your stools or have dark, tarry stools. Summary  Abdominal bloating means that the abdomen is swollen.  Common causes of abdominal bloating are swallowing air, constipation, and problems digesting food.  Avoid eating too much and avoid swallowing air.  Avoid foods that cause gas, carbonated drinks, hard candy, and chewing gum. This information is not intended to replace advice given to you by your health care provider. Make sure you discuss any questions you have with your health care provider. Document Revised: 11/07/2018 Document Reviewed: 08/21/2016 Elsevier Patient Education  2020 Elsevier Inc.  

## 2020-03-26 ENCOUNTER — Telehealth: Payer: Self-pay | Admitting: Pulmonary Disease

## 2020-03-26 NOTE — Telephone Encounter (Signed)
Berna Bue Order: 30mg  #1 prefilled syringe Ordered date: 03/26/20 Expected date of arrival: 3-5 business days Ordered by: [place]: [place]

## 2020-03-29 ENCOUNTER — Telehealth: Payer: Self-pay | Admitting: Pharmacy Technician

## 2020-03-29 NOTE — Telephone Encounter (Signed)
Received signed  [place] renewal application. Submitted Patient Assistance Application to [place] for [facility] along with provider portion. Will update patient when we receive a response.  Fax# [phone] Phone# [phone]  [phone]

## 2020-03-29 NOTE — Telephone Encounter (Signed)
Fasenra Shipment Received:  30mg  #1 prefilled syringe Medication arrival date: 03/29/20 Lot #: [id number] Exp date: 07/02/2021 Received by: Elliot Dally

## 2020-04-01 ENCOUNTER — Other Ambulatory Visit: Payer: Self-pay

## 2020-04-01 ENCOUNTER — Ambulatory Visit (INDEPENDENT_AMBULATORY_CARE_PROVIDER_SITE_OTHER): Payer: Self-pay

## 2020-04-01 DIAGNOSIS — Z7952 Long term (current) use of systemic steroids: Secondary | ICD-10-CM

## 2020-04-01 DIAGNOSIS — J4550 Severe persistent asthma, uncomplicated: Secondary | ICD-10-CM

## 2020-04-01 MED ORDER — BENRALIZUMAB 30 MG/ML ~~LOC~~ SOSY
30.0000 mg | PREFILLED_SYRINGE | Freq: Once | SUBCUTANEOUS | Status: AC
  Administered 2020-04-01: 30 mg via SUBCUTANEOUS

## 2020-04-01 NOTE — Progress Notes (Signed)
Have you been hospitalized within the last 10 days?  No Do you have a fever?  No Do you have a cough?  No Do you have a headache or sore throat? No Do you have your Epi Pen visible and is it within date?  Yes 

## 2020-04-02 NOTE — Telephone Encounter (Signed)
Received a fax from  [place] regarding an approval for FASENRApatient assistance from 05/02/20 to 05/01/21.   Reference number:PAT- [id number] Phone number: [phone]

## 2020-04-16 ENCOUNTER — Telehealth: Payer: Self-pay | Admitting: Pulmonary Disease

## 2020-04-16 DIAGNOSIS — J4550 Severe persistent asthma, uncomplicated: Secondary | ICD-10-CM

## 2020-04-16 MED ORDER — PREDNISONE 10 MG PO TABS: ORAL_TABLET | ORAL | 0 refills | Status: DC

## 2020-04-16 NOTE — Telephone Encounter (Signed)
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  Tammy can you please advise.  Thank you

## 2020-04-16 NOTE — Telephone Encounter (Signed)
Okay to send in:  Prednisone 10mg  tablet  >>>4 tabs for 2 days, then 3 tabs for 2 days, 2 tabs for 2 days, then 1 tab for 2 days, then stop >>>take with food  >>>take in the morning   Please encourage the patient to continue to wear a facemask at her new job.  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 Wyn Quaker FNP

## 2020-04-16 NOTE — Telephone Encounter (Signed)
Spoke with patient regarding prior message.Advised patient that Aaron Edelman Approved script for prednisone 10mg  taper 4 tabs for 2 days, then 3 tabs for 2 days, 2 tabs for 2 days, then 1 tab for 2 days, then stop.Patiernt was advised to wear her facemask .Patient's voice was understanding. Nothing else further needed.

## 2020-04-16 NOTE — Telephone Encounter (Signed)
Will send to [facility] NP who has seen last several times

## 2020-04-18 ENCOUNTER — Other Ambulatory Visit: Payer: Self-pay | Admitting: Physician Assistant

## 2020-04-25 ENCOUNTER — Ambulatory Visit: Payer: Medicaid Other | Admitting: Physician Assistant

## 2020-04-25 ENCOUNTER — Other Ambulatory Visit: Payer: Medicaid Other

## 2020-04-25 ENCOUNTER — Other Ambulatory Visit: Payer: Self-pay

## 2020-04-25 ENCOUNTER — Telehealth: Payer: Self-pay | Admitting: Pulmonary Disease

## 2020-04-25 ENCOUNTER — Encounter: Payer: Self-pay | Admitting: Physician Assistant

## 2020-04-25 DIAGNOSIS — Z20822 Contact with and (suspected) exposure to covid-19: Secondary | ICD-10-CM

## 2020-04-25 DIAGNOSIS — R059 Cough, unspecified: Secondary | ICD-10-CM

## 2020-04-25 DIAGNOSIS — R52 Pain, unspecified: Secondary | ICD-10-CM

## 2020-04-25 DIAGNOSIS — R0602 Shortness of breath: Secondary | ICD-10-CM

## 2020-04-25 DIAGNOSIS — K219 Gastro-esophageal reflux disease without esophagitis: Secondary | ICD-10-CM

## 2020-04-25 DIAGNOSIS — J4550 Severe persistent asthma, uncomplicated: Secondary | ICD-10-CM

## 2020-04-25 DIAGNOSIS — J45909 Unspecified asthma, uncomplicated: Secondary | ICD-10-CM

## 2020-04-25 MED ORDER — HYDROCOD POLST-CPM POLST ER 10-8 MG/5ML PO SUER: 5.0000 mL | Freq: Every evening | ORAL | 0 refills | Status: DC | PRN

## 2020-04-25 NOTE — Telephone Encounter (Signed)
04/25/2020  Tussionex cough syrup has been refilled.   PMP aware reviewed.  Please remind patient this should only be used at night and this is a sedating medication so she should not operate heavy machinery, drive or utilize during the day when she needs to be awake.  Wyn Quaker, FNP

## 2020-04-25 NOTE — Telephone Encounter (Signed)
04/25/2020  Agree with patient obtaining outpatient Covid test.  If patient is positive for SARS-CoV-2 would recommend that she consider receiving the monoclonal antibody infusion as this would likely benefit her given her comorbidities.  Would recommend changing patients currently planned and scheduled follow-up with Dr. Loanne Drilling to a virtual visit at this point in time.  I would not cancel it.  Please route this message to Dr. Loanne Drilling as Juluis Rainier.  If the patient is in fact positive for SARS-CoV-2 would recommend that she contact our office to notify us.  Wyn Quaker, FNP

## 2020-04-25 NOTE — Telephone Encounter (Signed)
Called and spoke to pt. Informed her of the recs per Wyn Quaker, NP. Pt is requesting a Tussionex refill to help her cough as it is keeping her awake at night.   Last filled on 01/31/20 for #150ml with 0 refills Filled by Wyn Quaker, NP.  Last office visit 03/19/20 with BPM  Aaron Edelman, please advise. Thanks.

## 2020-04-25 NOTE — Telephone Encounter (Signed)
Called and spoke to pt. Pt was given pred taper (by pulm) on 9/14 (phone note) and had no improvement. Pt c/o of newer s/s now. Pt was seen by pcp today and advised to call pulm, pt going for a covid test this evening. Pt c/o increase in SOB, intermittent prod cough with light tan or clear mucus, chest heaviness, fever on 9/21 of 100.4 and began tylenol and is sti taking, body aches, mild BIL feet swelling. Pt has had Duoneb 4 times today. Pt is fully vaccinated. Pt has had repeated covid exposures at work, most recent one was on 9/19.   Will send to APP of the day as Dr. Loanne Drilling is unavailable. Aaron Edelman, please advise. Thanks.

## 2020-04-25 NOTE — Telephone Encounter (Signed)
Spoke with the pt and made aware of recommendations per Aaron Edelman  She verbalized understanding

## 2020-04-25 NOTE — Progress Notes (Signed)
There were no vitals taken for this visit.   Subjective:    Patient ID: Linda Ellis, female    DOB: September 14, 1975, 44 y.o.   MRN: [id number]  HPI: Linda Ellis is a 44 y.o. female presenting on 04/25/2020 for No chief complaint on file.   HPI   His is a telemedicine appointment through [employer] due to coronavirus pandemic.  I connected with  Linda Ellis on 04/25/20 by a video enabled telemedicine application and verified that I am speaking with the correct person using two identifiers.   I discussed the limitations of evaluation and management by telemedicine. The patient expressed understanding and agreed to proceed.  Pt is in her parked car.  Provider is in office.   Pt was scheduled for in-office appointment to follow up GERD but she is sick.  She says her whole body hurts.  She says she started feeling bad last week.  She called her pulmonologist who she sees regularly for her significan asthma last week and he sent in rx for prednisone.  She thinks it was too small a dose for too short a time.     She just started working at [employer] part time in [place].  She says she daily has co-workers who are testing positive for covid.    Pt did receive the covid vaccination with her second dose in May.   Pt says she is taking both the protonix and the omeprazole but says she is no longer having abdominal pain.      Relevant past medical, surgical, family and social history reviewed and updated as indicated. Interim medical history since our last visit reviewed. Allergies and medications reviewed and updated.   Current Outpatient Medications:    acetaminophen (TYLENOL) 500 MG tablet, Take 500-1,000 mg by mouth every 6 (six) hours as needed., Disp: , Rfl:    Benralizumab (FASENRA) 30 MG/ML SOSY, Inject 1 mL (30 mg total) into the skin every 8 (eight) weeks., Disp: 0.28 mL, Rfl: 5   budesonide-formoterol (SYMBICORT) 160-4.5 MCG/ACT inhaler, Inhale 2 puffs into the lungs 3 (three) times  daily., Disp: 1 Inhaler, Rfl: 6   Famotidine (PEPCID PO), Take 1 tablet by mouth daily. , Disp: , Rfl:    fluticasone (FLONASE) 50 MCG/ACT nasal spray, Place 1 spray into both nostrils 2 (two) times daily., Disp: 16 g, Rfl: 3   ipratropium-albuterol (DUONEB) 0.5-2.5 (3) MG/3ML SOLN, INHALE 1 VIAL VIA NEBULIZER EVERY 6 HOURS AS NEEDED, Disp: 360 mL, Rfl: 1   loratadine (CLARITIN) 10 MG tablet, Take 1 tablet (10 mg total) by mouth daily as needed. (Patient taking differently: Take 10 mg by mouth daily. ), Disp: 90 tablet, Rfl: 1   montelukast (SINGULAIR) 10 MG tablet, Take 1 tablet (10 mg total) by mouth at bedtime., Disp: 90 tablet, Rfl: 1   omeprazole (PRILOSEC) 40 MG capsule, TAKE 1 Capsule BY MOUTH ONCE EVERY DAY, Disp: 90 capsule, Rfl: 1   pantoprazole (PROTONIX) 40 MG tablet, Take 1 tablet (40 mg total) by mouth 2 (two) times daily before a meal., Disp: 60 tablet, Rfl: 3   promethazine (PHENERGAN) 25 MG tablet, Take 1 tablet (25 mg total) by mouth every 8 (eight) hours as needed for nausea or vomiting., Disp: 20 tablet, Rfl: 0   PROVENTIL HFA 108 (90 Base) MCG/ACT inhaler, INHALE 1 TO 2 PUFFS BY MOUTH EVERY 6 HOURS AS NEEDED FOR COUGHING, WHEEZING, OR SHORTNESS OF BREATH, Disp: 20.1 g, Rfl: prn   Tiotropium Bromide Monohydrate (  SPIRIVA RESPIMAT) 1.25 MCG/ACT AERS, Inhale 2 puffs into the lungs daily., Disp: 4 g, Rfl: 0   UNKNOWN TO PATIENT, OTC Gas medication, Disp: , Rfl:    alum & mag hydroxide-simeth (MAALOX/MYLANTA) 812-751-70 MG/5ML suspension, Take by mouth 2 (two) times daily as needed for indigestion or heartburn. (Patient not taking: Reported on 04/25/2020), Disp: , Rfl:      Review of Systems  Per HPI unless specifically indicated above     Objective:    There were no vitals taken for this visit.  Wt Readings from Last 3 Encounters:  03/25/20 (!) 328 lb (148.8 kg)  03/19/20 (!) 322 lb 12.8 oz (146.4 kg)  02/16/20 (!) 325 lb 8 oz (147.6 kg)    Physical  Exam Constitutional:      General: She is not in acute distress.    Appearance: She is ill-appearing. She is not toxic-appearing.  HENT:     Head: Normocephalic and atraumatic.  Pulmonary:     Effort: No respiratory distress.  Neurological:     Mental Status: She is alert and oriented to person, place, and time.  Psychiatric:        Attention and Perception: Attention normal.        Speech: Speech normal.        Behavior: Behavior is cooperative.           Assessment & Plan:    Encounter Diagnoses  Name Primary?   Person under investigation for COVID-19 Yes   SOB (shortness of breath)    Generalized body aches    Cough    Asthma, unspecified asthma severity, unspecified whether complicated, unspecified whether persistent    Gastroesophageal reflux disease, unspecified whether esophagitis present      -Pt is scheduled for covid test later today and is reminded to self-quarantine until she gets her results.  She is given note for work. -encouraged pt to touch base with her pulmonologist about not getting better with prednisone sent for her last week -she is aware to go to ER if she has worsening breathing/trouble breathing -pt is scheduled to follow up in office in 1 month for GERD

## 2020-04-26 ENCOUNTER — Telehealth: Payer: Self-pay | Admitting: Pulmonary Disease

## 2020-04-26 DIAGNOSIS — R059 Cough, unspecified: Secondary | ICD-10-CM

## 2020-04-26 DIAGNOSIS — J4550 Severe persistent asthma, uncomplicated: Secondary | ICD-10-CM

## 2020-04-26 MED ORDER — HYDROCOD POLST-CPM POLST ER 10-8 MG/5ML PO SUER: 5.0000 mL | Freq: Every evening | ORAL | 0 refills | Status: DC | PRN

## 2020-04-26 NOTE — Telephone Encounter (Signed)
Spoke with the pt  Linda Ellis send # 140 ml tussionex in for her 04/25/20 to [facility] in [place]  She states that she is not staying there now and unable to get this and wants rx to instead to be sent to [facility] in [place]  I called [facility] and cancelled the rx   Linda Ellis can you resend please to [facility] in [place]? I pended rx   Also, while on phone with the pt she stated that since yesterday she has developed severe swelling and pain her both hands. She is asking if this is a symptom of covid. She denies any other new symptoms since she called yesterday and is planning on going to get her covid test completed today. Please advise, thanks

## 2020-04-26 NOTE — Telephone Encounter (Signed)
Spoke with patient regarding prior message.Advised patient Aaron Edelman sent in a script to her pharmacy. Severe arm and hand swelling is not a traditional symptom of Covid.  She can definitely have worsened myalgias with Covid.  No new recommendations at this time.  If the symptoms become severe she can present to an emergency room or urgent care for further evaluation.  Recommend obtaining outpatient Covid test as previously stated.  If patient does test positive for Covid may be candidate for monoclonal antibody infusion.  Patient voice was understanding. Nothing else further needed.

## 2020-04-26 NOTE — Telephone Encounter (Signed)
04/26/2020  Thank you for canceling the previously sent prescription.  I have signed the new prescription.  I have reviewed [place] PMP aware.  Severe arm and hand swelling is not a traditional symptom of Covid.  She can definitely have worsened myalgias with Covid.  No new recommendations at this time.  If the symptoms become severe she can present to an emergency room or urgent care for further evaluation.  Recommend obtaining outpatient Covid test as previously stated.  If patient does test positive for Covid may be candidate for monoclonal antibody infusion.  Wyn Quaker FNP

## 2020-04-27 ENCOUNTER — Telehealth: Payer: Self-pay | Admitting: Physician Assistant

## 2020-04-27 LAB — SARS-COV-2, NAA 2 DAY TAT

## 2020-04-27 LAB — NOVEL CORONAVIRUS, NAA: SARS-CoV-2, NAA: NOT DETECTED

## 2020-04-27 LAB — SPECIMEN STATUS REPORT

## 2020-04-27 NOTE — Telephone Encounter (Signed)
Called pt and reviewed negative covid test results.  She is still running fever and is having some breathing issues.  She says she spoke with her pulmonologist yesterday and is supposed to call them Monday if her test is negative for possibly another Rx.  She requests note for work.  This will be emailed to her on Monday.  Confirmed her email on record is correct.  All her questions were answered.

## 2020-04-29 ENCOUNTER — Telehealth: Payer: Self-pay | Admitting: Pulmonary Disease

## 2020-04-29 MED ORDER — PREDNISONE 10 MG PO TABS: ORAL_TABLET | ORAL | 0 refills | Status: DC

## 2020-04-29 NOTE — Telephone Encounter (Signed)
ATC and reached a busy signal.

## 2020-04-29 NOTE — Telephone Encounter (Signed)
If Covid test is negative okay to prescribe:  Prednisone 10mg  tablet  >>>4 tabs for 2 days, then 3 tabs for 2 days, 2 tabs for 2 days, then 1 tab for 2 days, then stop >>>take with food  >>>take in the morning   Okay to send in order.  Patient needs to keep upcoming follow-up with Dr. Loanne Drilling on 05/03/2020.  This needs to be an in office visit for physical evaluation since she has a negative Covid test.  We need to have a documented negative Covid test prior to this visit.  She needs to provide this for [place].  Patient needs to keep this upcoming visit for further evaluation as well as management of her breathing.  Wyn Quaker, FNP

## 2020-04-29 NOTE — Telephone Encounter (Signed)
Called and spoke with patient who states that she is still wheezing, has a fever and that this morning it was 100.6, her hands are aching. Patient was Covid tested last Thursday it was negative. Has vaccines. Productive cough but states she only coughs stuff up after a breathing treatment and normally does not spit it out. Patient is requesting Prednisone.    Aaron Edelman please advise

## 2020-04-29 NOTE — Telephone Encounter (Signed)
Called and spoke with patient.  Provided instructions per Wyn Quaker NP.  Patient states her Covid test result is on mychart, she can print it out and bring it with her to her appointment so we can scan it into her record.  She verbalized understanding of instructions.  Nothing further needed.

## 2020-05-03 ENCOUNTER — Encounter: Payer: Self-pay | Admitting: Pulmonary Disease

## 2020-05-03 ENCOUNTER — Other Ambulatory Visit: Payer: Self-pay

## 2020-05-03 ENCOUNTER — Ambulatory Visit (INDEPENDENT_AMBULATORY_CARE_PROVIDER_SITE_OTHER): Payer: Self-pay | Admitting: Pulmonary Disease

## 2020-05-03 VITALS — BP 140/70 | HR 100 | Temp 97.9°F | Wt 328.5 lb

## 2020-05-03 DIAGNOSIS — J4551 Severe persistent asthma with (acute) exacerbation: Secondary | ICD-10-CM

## 2020-05-03 DIAGNOSIS — Z7952 Long term (current) use of systemic steroids: Secondary | ICD-10-CM

## 2020-05-03 MED ORDER — PREDNISONE 10 MG PO TABS: ORAL_TABLET | ORAL | 0 refills | Status: DC

## 2020-05-03 MED ORDER — METHYLPREDNISOLONE ACETATE 80 MG/ML IJ SUSP
120.0000 mg | Freq: Once | INTRAMUSCULAR | Status: AC
  Administered 2020-05-03: 120 mg via INTRAMUSCULAR

## 2020-05-03 MED ORDER — AZITHROMYCIN 250 MG PO TABS: ORAL_TABLET | ORAL | 0 refills | Status: DC

## 2020-05-03 NOTE — Progress Notes (Signed)
Subjective:   PATIENT ID: [name] GENDER: female DOB: 1975/11/02, MRN: [id number]   HPI  Chief Complaint  Patient presents with  . Follow-up    increased SHOB, coughing with light tan color sputum, wheezing,  she was tested last week with a negative result of covid, she did have fever, wheezing, loss of taste and still unable to smell, unable to sleep. used the cough meds but still could not sleep.      Reason for Visit: Follow-up biologic  Ms. Summerlyn Fickel is a 44 year old female former smoker who presents for follow-up of severe persistent asthma with steroid dependence.   Synopsis: Since moving to [place] in 2002, has had symptoms of dyspnea, cough and wheezing. Triggered by exertion, heat and smoke. She has had frequent asthma exacerbations requiring steroid treatment nearly every month of 2019 and 2020 despite being compliant on steroid inhalers. She is steroid dependent. Has had multiple exacerbations in 2019 through 2020. She was previously on [facility] for 3 months however stopped taking due to oral rashes/rashes last month. Has tried Breo (6 months), Qvar (2 years well-controlled, recently on for 2 months and stopped due to ineffectiveness) and Symbicort (1 month discontinued due to cost).   She has been on Fasenra since 10/13/19. Compliant Symbicort 160-4.5 mcg TWO puffs THREE times daily and Spiriva 1.25 mcg TWO puffs ONCE a day. She is using albuterol and nebulizer 5-4 times daily She has required steroids June, August and September (x2) She has shortness of breath, wheezing and productive cough with tannish sputum which is new. She reports low-grade fever for which she is taking tylenol. She recently was covid negative.  She started working at [employer] and there has been positive covid cases.  ACT:   Steroids Received in 2019 and 2020 2019 Jan Feb March April May June July Aug Sept Oct Nov Dec   X X XX XX  XX X XX XXX  XX XXX  2020 Jan Feb March April May June July Aug  Sept Oct Nov Dec   X X Saddie Benders   XX  2021 Jan Feb March April May June July Aug Sept Oct Nov Dec    X Fasenra XX X X  X XX X     Social History: Social smoker 31 pack-years, started at age 67. Quit in 6957 Her 98 year old daughter recently passed in a car accident in 10/2019  Environmental exposures:  Worked in [employer] in 2010, left job due to respiratory symptoms  I have personally reviewed patient's past medical/family/social history/allergies/current medications.  Past Medical History:  Diagnosis Date  . Asthma 2008  . COPD (chronic obstructive pulmonary disease) ([place])   . GERD (gastroesophageal reflux disease)   . Multiple gastric ulcers   . Pneumonia      Outpatient Medications Prior to Visit  Medication Sig Dispense Refill  . acetaminophen (TYLENOL) 500 MG tablet Take 500-1,000 mg by mouth every 6 (six) hours as needed.    . Benralizumab (FASENRA) 30 MG/ML SOSY Inject 1 mL (30 mg total) into the skin every 8 (eight) weeks. 0.28 mL 5  . budesonide-formoterol (SYMBICORT) 160-4.5 MCG/ACT inhaler Inhale 2 puffs into the lungs 3 (three) times daily. 1 Inhaler 6  . chlorpheniramine-HYDROcodone (TUSSIONEX PENNKINETIC ER) 10-8 MG/5ML SUER Take 5 mLs by mouth at bedtime as needed for cough. 140 mL 0  . Famotidine (PEPCID PO) Take 1 tablet by mouth daily.     Marland Kitchen  fluticasone (FLONASE) 50 MCG/ACT nasal spray Place 1 spray into both nostrils 2 (two) times daily. 16 g 3  . ipratropium-albuterol (DUONEB) 0.5-2.5 (3) MG/3ML SOLN INHALE 1 VIAL VIA NEBULIZER EVERY 6 HOURS AS NEEDED 360 mL 1  . loratadine (CLARITIN) 10 MG tablet Take 1 tablet (10 mg total) by mouth daily as needed. (Patient taking differently: Take 10 mg by mouth daily. ) 90 tablet 1  . montelukast (SINGULAIR) 10 MG tablet Take 1 tablet (10 mg total) by mouth at bedtime. 90 tablet 1  . omeprazole (PRILOSEC) 40 MG capsule TAKE 1 Capsule BY MOUTH ONCE EVERY DAY 90 capsule 1  . pantoprazole (PROTONIX) 40 MG  tablet Take 1 tablet (40 mg total) by mouth 2 (two) times daily before a meal. 60 tablet 3  . predniSONE (DELTASONE) 10 MG tablet Take 4 tablets (40 mg total) by mouth daily with breakfast for 2 days, THEN 3 tablets (30 mg total) daily with breakfast for 2 days, THEN 2 tablets (20 mg total) daily with breakfast for 2 days, THEN 1 tablet (10 mg total) daily with breakfast for 2 days. 20 tablet 0  . PROVENTIL HFA 108 (90 Base) MCG/ACT inhaler INHALE 1 TO 2 PUFFS BY MOUTH EVERY 6 HOURS AS NEEDED FOR COUGHING, WHEEZING, OR SHORTNESS OF BREATH 20.1 g prn  . Tiotropium Bromide Monohydrate (SPIRIVA RESPIMAT) 1.25 MCG/ACT AERS Inhale 2 puffs into the lungs daily. 4 g 0  . UNKNOWN TO PATIENT OTC Gas medication    . alum & mag hydroxide-simeth (MAALOX/MYLANTA) 200-200-20 MG/5ML suspension Take by mouth 2 (two) times daily as needed for indigestion or heartburn. (Patient not taking: Reported on 04/25/2020)    . promethazine (PHENERGAN) 25 MG tablet Take 1 tablet (25 mg total) by mouth every 8 (eight) hours as needed for nausea or vomiting. (Patient not taking: Reported on 05/03/2020) 20 tablet 0   No facility-administered medications prior to visit.   Review of Systems  Constitutional: Negative for chills, diaphoresis, fever, malaise/fatigue and weight loss.  HENT: Negative for congestion.   Respiratory: Positive for cough, sputum production, shortness of breath and wheezing. Negative for hemoptysis.   Cardiovascular: Negative for chest pain, palpitations and leg swelling.   Objective:   Vitals:   05/03/20 0903  BP: 140/70  Pulse: 100  Temp: 97.9 F (36.6 C)  TempSrc: Oral  SpO2: 95%  Weight: (!) 328 lb 8 oz (149 kg)   SpO2: 95 %   Physical Exam: General: Obese, no acute distress HENT: [place], AT Eyes: EOMI, no scleral icterus Respiratory: Difficult to auscultate. No rhonchi but expiratory wheeze present bilaterally Cardiovascular: RRR, -M/R/G, no JVD Extremities:-Edema,-tenderness Neuro: AAO x4,  CNII-XII grossly intact Psych: Normal mood, normal affect  Data Reviewed:  Imaging: CXR 02/08/19 - Chronic bronchial thickening CT A/P 10/17/18 - RML scarring  PFT: 10/09/19 FVC 2.46 (64%) FEV1 1.57 (50%) Ratio 62  TLC 97% DLCO 100% Interpretation: Moderately severe obstructive defect. Reduced FVC with normal TLC suggests air trapping. Elevated RV and RV/TLC also suggests air trapping  Labs: SARS-Covid-2 01/17/2019- not detected 10/17/2018 WBC 11.2 with N 55% (6.2) L 32% (3.6) M 7% (0.8) and E 5% (0.5)  Absolute eosinophils 10/17/18 - 500 IgE 02/13/19 - 680  Imaging, labs and test noted above have been reviewed independently by me.    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 Leonia Corona on 10/13/19. In-office influenza negative. In  active exacerbation  Severe persistent allergic asthma with steroid dependence with exacerbation Give Depo shot START Prednisone taper  START Azithro Continue Fasenra CONTINUE Symbicort 160-4.5 mcg TWO puffs TWICE daily CONTINUE Spiriva 1.25 mcg TWO puffs ONCE daily CONTINUE montelukast 10mg  daily Continue Duonebs as needed  Asthma Action Plan Increase Symbicort to 2 puffs three times a day for worsening shortness of breath, wheezing and cough. If you symptoms do not improve in 24-48 hours, start steroid pack that has been prescribed. Please call our office for evaluation and to let us know you steroid the steroid pack (60,50,40,30,20,10 pack).   Health Maintenance Immunization History  Administered Date(s) Administered  . Influenza,inj,Quad PF,6+ Mos 05/04/2018  . PFIZER SARS-COV-2 Vaccination 11/06/2019, 12/22/2019   No orders of the defined types were placed in this encounter.  Meds ordered this encounter  Medications  . DISCONTD: predniSONE (DELTASONE) 10 MG tablet    Sig: Take 6 tablets x three days (60 mg), followed by  5 tablets x three days (50mg ), then 4 tablets x three day(40mg ), then 3 tablets (30mg ) x three days, then 2 tablets (20mg ) x three days, then 1 tablet (10mg ) x three days, then STOP    Dispense:  60 tablet    Refill:  0  . azithromycin (ZITHROMAX Z-PAK) 250 MG tablet    Sig: Take as directed    Dispense:  1 each    Refill:  0    500 mg once, followed by 250mg  daily x 4 days  . methylPREDNISolone acetate (DEPO-MEDROL) injection 120 mg  . predniSONE (DELTASONE) 10 MG tablet    Sig: Take 6 tablets x three days (60 mg), followed by 5 tablets x three days (50mg ), then 4 tablets x three day(40mg ), then 3 tablets (30mg ) x three days, then 2 tablets (20mg ) x three days, then 1 tablet (10mg ) x three days, then STOP    Dispense:  60 tablet    Refill:  0    Return in 2 weeks (on 05/17/2020).  [place], MD [facility] 05/03/2020 9:14 AM  Office Number [phone]

## 2020-05-03 NOTE — Patient Instructions (Addendum)
Severe persistent allergic asthma with steroid dependence with exacerbation Give Depo shot START Prednisone taper  START Azithro Continue Fasenra CONTINUE Symbicort 160-4.5 mcg TWO puffs TWICE daily CONTINUE Spiriva 1.25 mcg TWO puffs ONCE daily CONTINUE montelukast 10mg  daily Continue Duonebs as needed  Follow-up Oct 13th or 15th with me

## 2020-05-08 ENCOUNTER — Ambulatory Visit: Payer: Medicaid Other | Admitting: Physician Assistant

## 2020-05-08 ENCOUNTER — Encounter: Payer: Self-pay | Admitting: Physician Assistant

## 2020-05-08 DIAGNOSIS — R109 Unspecified abdominal pain: Secondary | ICD-10-CM

## 2020-05-08 DIAGNOSIS — R350 Frequency of micturition: Secondary | ICD-10-CM

## 2020-05-08 DIAGNOSIS — J4551 Severe persistent asthma with (acute) exacerbation: Secondary | ICD-10-CM

## 2020-05-08 NOTE — Progress Notes (Signed)
There were no vitals taken for this visit.   Subjective:    Patient ID: Linda Ellis, female    DOB: 09-26-1975, 43 y.o.   MRN: [id number]  HPI: Linda Ellis is a 43 y.o. female presenting on 05/08/2020 for No chief complaint on file.   HPI   This is a telemedicine appointment due to coronavirus pandemic.  I connected with  Linda Ellis on 05/08/2020 by a video enabled telemedicine application and verified that I am speaking with the correct person using two identifiers.   I discussed the limitations of evaluation and management by telemedicine. The patient expressed understanding and agreed to proceed.  Pt is at home.  Provider is at office.    Pt called to make appointment for today for sick and back pain.     Pt seen by pulmonary Friday 10/1- got depo shot and prednisone taper.  It has helped her breathing, she says, but she is still not back to where she should be. she is still having coughing and wheezing.  She has Not checked her temperature because she is taking APAP but she hasn't felt feverish.    Back hurts on left side.  She says it Feels like her kidney.  She reports Increased urinary frequency.     Pt requests note to continue to be out of work.  She says she is scheduled to return soon and she doesn't feel like she is well enough to return.      Relevant past medical, surgical, family and social history reviewed and updated as indicated. Interim medical history since our last visit reviewed. Allergies and medications reviewed and updated.   Current Outpatient Medications:  .  acetaminophen (TYLENOL) 500 MG tablet, Take 500-1,000 mg by mouth every 6 (six) hours as needed., Disp: , Rfl:  .  azithromycin (ZITHROMAX Z-PAK) 250 MG tablet, Take as directed, Disp: 1 each, Rfl: 0 .  Benralizumab (FASENRA) 30 MG/ML SOSY, Inject 1 mL (30 mg total) into the skin every 8 (eight) weeks., Disp: 0.28 mL, Rfl: 5 .  budesonide-formoterol (SYMBICORT) 160-4.5 MCG/ACT inhaler,  Inhale 2 puffs into the lungs 3 (three) times daily., Disp: 1 Inhaler, Rfl: 6 .  chlorpheniramine-HYDROcodone (TUSSIONEX PENNKINETIC ER) 10-8 MG/5ML SUER, Take 5 mLs by mouth at bedtime as needed for cough., Disp: 140 mL, Rfl: 0 .  Famotidine (PEPCID PO), Take 1 tablet by mouth daily. , Disp: , Rfl:  .  fluticasone (FLONASE) 50 MCG/ACT nasal spray, Place 1 spray into both nostrils 2 (two) times daily., Disp: 16 g, Rfl: 3 .  ipratropium-albuterol (DUONEB) 0.5-2.5 (3) MG/3ML SOLN, INHALE 1 VIAL VIA NEBULIZER EVERY 6 HOURS AS NEEDED, Disp: 360 mL, Rfl: 1 .  loratadine (CLARITIN) 10 MG tablet, Take 1 tablet (10 mg total) by mouth daily as needed. (Patient taking differently: Take 10 mg by mouth daily. ), Disp: 90 tablet, Rfl: 1 .  montelukast (SINGULAIR) 10 MG tablet, Take 1 tablet (10 mg total) by mouth at bedtime., Disp: 90 tablet, Rfl: 1 .  omeprazole (PRILOSEC) 40 MG capsule, TAKE 1 Capsule BY MOUTH ONCE EVERY DAY, Disp: 90 capsule, Rfl: 1 .  pantoprazole (PROTONIX) 40 MG tablet, Take 1 tablet (40 mg total) by mouth 2 (two) times daily before a meal., Disp: 60 tablet, Rfl: 3 .  predniSONE (DELTASONE) 10 MG tablet, Take 6 tablets x three days (60 mg), followed by 5 tablets x three days (50mg ), then 4 tablets x three day(40mg ), then 3 tablets (30mg )  x three days, then 2 tablets (20mg ) x three days, then 1 tablet (10mg ) x three days, then STOP, Disp: 60 tablet, Rfl: 0 .  PROVENTIL HFA 108 (90 Base) MCG/ACT inhaler, INHALE 1 TO 2 PUFFS BY MOUTH EVERY 6 HOURS AS NEEDED FOR COUGHING, WHEEZING, OR SHORTNESS OF BREATH, Disp: 20.1 g, Rfl: prn .  Tiotropium Bromide Monohydrate (SPIRIVA RESPIMAT) 1.25 MCG/ACT AERS, Inhale 2 puffs into the lungs daily., Disp: 4 g, Rfl: 0 .  UNKNOWN TO PATIENT, OTC Gas medication, Disp: , Rfl:  .  alum & mag hydroxide-simeth (MAALOX/MYLANTA) 272-536-64 MG/5ML suspension, Take by mouth 2 (two) times daily as needed for indigestion or heartburn. (Patient not taking: Reported on  04/25/2020), Disp: , Rfl:  .  promethazine (PHENERGAN) 25 MG tablet, Take 1 tablet (25 mg total) by mouth every 8 (eight) hours as needed for nausea or vomiting. (Patient not taking: Reported on 05/03/2020), Disp: 20 tablet, Rfl: 0     Review of Systems  Per HPI unless specifically indicated above     Objective:    There were no vitals taken for this visit.  Wt Readings from Last 3 Encounters:  05/03/20 (!) 328 lb 8 oz (149 kg)  03/25/20 (!) 328 lb (148.8 kg)  03/19/20 (!) 322 lb 12.8 oz (146.4 kg)    Physical Exam Constitutional:      General: She is not in acute distress.    Appearance: She is obese. She is not toxic-appearing.  HENT:     Head: Normocephalic and atraumatic.  Pulmonary:     Effort: No respiratory distress.  Neurological:     Mental Status: She is alert and oriented to person, place, and time.           Assessment & Plan:    Encounter Diagnoses  Name Primary?  . Left flank pain Yes  . Urinary frequency   . Severe persistent asthma with acute exacerbation     -pt to continue with prednisone and nebs as instructed by pulmonary.  She has follow up there on 05/17/20 -will check UA and culture.  She is counseled to drink plenty of water, avoid sodas -she can apply some heat to her back where it is hurting.  She is cautioned to not leave it on more than 15-20 miuntes at a time and not to sleep with it due to risk of skin damage.  -pt will be emailed a letter to be out of work until she is seen by pulmonologist on 10/15.  She is to get RTW note from her if she needs one.  -pt has follow up here later this month

## 2020-05-09 ENCOUNTER — Telehealth: Payer: Self-pay | Admitting: Pulmonary Disease

## 2020-05-09 ENCOUNTER — Other Ambulatory Visit (HOSPITAL_COMMUNITY)
Admission: RE | Admit: 2020-05-09 | Discharge: 2020-05-09 | Disposition: A | Discharge status: Home / Self Care | Payer: Medicaid Other | Source: Ambulatory Visit | Attending: Physician Assistant | Admitting: Physician Assistant

## 2020-05-09 ENCOUNTER — Other Ambulatory Visit: Payer: Self-pay

## 2020-05-09 DIAGNOSIS — R109 Unspecified abdominal pain: Secondary | ICD-10-CM | POA: Insufficient documentation

## 2020-05-09 DIAGNOSIS — R350 Frequency of micturition: Secondary | ICD-10-CM | POA: Insufficient documentation

## 2020-05-09 LAB — URINALYSIS, ROUTINE W REFLEX MICROSCOPIC
Bacteria, UA: NONE SEEN
Bilirubin Urine: NEGATIVE
Glucose, UA: NEGATIVE mg/dL
Hgb urine dipstick: NEGATIVE
Ketones, ur: NEGATIVE mg/dL
Nitrite: NEGATIVE
Protein, ur: NEGATIVE mg/dL
Specific Gravity, Urine: 1.015 (ref 1.005–1.030)
pH: 5 (ref 5.0–8.0)

## 2020-05-09 MED ORDER — IPRATROPIUM-ALBUTEROL 0.5-2.5 (3) MG/3ML IN SOLN: RESPIRATORY_TRACT | 1 refills | Status: DC

## 2020-05-09 NOTE — Telephone Encounter (Signed)
Spoke with pt. She is needing a refill on Duoneb. Rx has been sent in. Nothing further was needed.

## 2020-05-10 LAB — URINE CULTURE

## 2020-05-13 ENCOUNTER — Other Ambulatory Visit: Payer: Self-pay | Admitting: Pulmonary Disease

## 2020-05-13 DIAGNOSIS — J4550 Severe persistent asthma, uncomplicated: Secondary | ICD-10-CM

## 2020-05-17 ENCOUNTER — Encounter: Payer: Self-pay | Admitting: Pulmonary Disease

## 2020-05-17 ENCOUNTER — Ambulatory Visit (INDEPENDENT_AMBULATORY_CARE_PROVIDER_SITE_OTHER): Payer: Self-pay | Admitting: Pulmonary Disease

## 2020-05-17 ENCOUNTER — Telehealth: Payer: Self-pay | Admitting: Pharmacy Technician

## 2020-05-17 ENCOUNTER — Other Ambulatory Visit: Payer: Self-pay | Admitting: Pulmonary Disease

## 2020-05-17 ENCOUNTER — Other Ambulatory Visit: Payer: Self-pay

## 2020-05-17 VITALS — BP 128/84 | HR 90 | Temp 97.9°F | Ht 65.0 in | Wt 329.2 lb

## 2020-05-17 DIAGNOSIS — M25552 Pain in left hip: Secondary | ICD-10-CM

## 2020-05-17 DIAGNOSIS — Z7952 Long term (current) use of systemic steroids: Secondary | ICD-10-CM

## 2020-05-17 DIAGNOSIS — Z79899 Other long term (current) drug therapy: Secondary | ICD-10-CM

## 2020-05-17 DIAGNOSIS — J4550 Severe persistent asthma, uncomplicated: Secondary | ICD-10-CM

## 2020-05-17 LAB — CBC WITH DIFFERENTIAL/PLATELET
Basophils Absolute: 0.3 10*3/uL — ABNORMAL HIGH (ref 0.0–0.1)
Basophils Relative: 1.5 % (ref 0.0–3.0)
Eosinophils Absolute: 0.1 10*3/uL (ref 0.0–0.7)
Eosinophils Relative: 0.4 % (ref 0.0–5.0)
HCT: 40.4 % (ref 36.0–46.0)
Hemoglobin: 13.6 g/dL (ref 12.0–15.0)
Lymphocytes Relative: 30 % (ref 12.0–46.0)
Lymphs Abs: 5.2 10*3/uL — ABNORMAL HIGH (ref 0.7–4.0)
MCHC: 33.7 g/dL (ref 30.0–36.0)
MCV: 90.2 fl (ref 78.0–100.0)
Monocytes Absolute: 1.3 10*3/uL — ABNORMAL HIGH (ref 0.1–1.0)
Monocytes Relative: 7.5 % (ref 3.0–12.0)
Neutro Abs: 10.6 10*3/uL — ABNORMAL HIGH (ref 1.4–7.7)
Neutrophils Relative %: 60.6 % (ref 43.0–77.0)
Platelets: 438 10*3/uL — ABNORMAL HIGH (ref 150.0–400.0)
RBC: 4.47 Mil/uL (ref 3.87–5.11)
RDW: 14.9 % (ref 11.5–15.5)
WBC: 17.4 10*3/uL — ABNORMAL HIGH (ref 4.0–10.5)

## 2020-05-17 MED ORDER — METHYLPREDNISOLONE ACETATE 80 MG/ML IJ SUSP
80.0000 mg | Freq: Once | INTRAMUSCULAR | Status: AC
  Administered 2020-05-17: 80 mg via INTRAMUSCULAR

## 2020-05-17 MED ORDER — PREDNISONE 10 MG PO TABS
ORAL_TABLET | ORAL | 0 refills | Status: DC
Start: 2020-05-17 — End: 2020-06-24

## 2020-05-17 NOTE — Telephone Encounter (Signed)
Patient has no pharmacy insurance. Will initiate Dup[employer]ent patient assistance.

## 2020-05-17 NOTE — Telephone Encounter (Signed)
-----   Message from [place], MD sent at 05/17/2020  1:35 PM EDT ----- Patient with severe persistent asthma, current failing Fasenra. Will need to transition from [place] and start enrollment for [place]. Forms filled out in office. Is there anything additional that needs to be done?-JE

## 2020-05-17 NOTE — Telephone Encounter (Signed)
Will initiate Dupixent BIV

## 2020-05-17 NOTE — Progress Notes (Signed)
Subjective:   PATIENT ID: Linda Ellis GENDER: female DOB: [birth year], MRN: [id number]   HPI  Chief Complaint  Patient presents with  . Follow-up    not doing much better, still SOB, wheezing, coughing up mucous and chest tightness    Reason for Visit: Asthma follow-up  [name] is a 44 year old female former smoker who presents for follow-up of severe persistent asthma with steroid dependence.   Synopsis: Since moving to [place] in 2002, has had symptoms of dyspnea, cough and wheezing. Triggered by exertion, heat and smoke. She has had frequent asthma exacerbations requiring steroid treatment nearly every month of 2019 and 2020 despite being compliant on steroid inhalers. She is steroid dependent. Has had multiple exacerbations in 2019 through 2020. She was previously on [facility] for 3 months however stopped taking due to oral rashes/rashes last month. Has tried Breo (6 months), Qvar (2 years well-controlled, recently on for 2 months and stopped due to ineffectiveness) and Symbicort (1 month discontinued due to cost). She has been on Fasenra since 10/13/19.  Interval She was last seen by me on 05/03/2020.  On that visit she was in active exacerbation and given steroid shot and started on prednisone taper and azithromycin. She reports her shortness of breath, wheezing, chest tightness and productive cough that initially improved for three days however returned and prevents her from ambulating.  She is compliant on her Symbicort 160-4.5 mcg 2 puffs 3 times a day and Spiriva 1.25 mcg 2 puffs once a day.  She is needing to use albuterol and nebulizer at least five times daily. She reports left hip pain that has worsened in the last few weeks.  ACT:  Asthma Control Test ACT Total Score  05/17/2020 6  05/03/2020 7  03/19/2020 8   Steroids Received in 2019 and 2020 2019 Jan Feb March April May June July Aug Sept Oct Nov Dec   X X XX XX  XX X XX XXX  XX XXX  2020 Jan Feb March April May  June July Aug Sept Oct Nov Dec   X X Saddie Benders   XX  2021 Jan Feb March April May June July Aug Sept Oct Nov Dec    X Fasenra XX Junius Argyle XX XX     Social History: Social smoker 31 pack-years, started at age 28. Quit in 3869 Her 25 year old daughter recently passed in a car accident in 10/2019  Environmental exposures:  Worked in [employer] in 2010, left job due to respiratory symptoms  I have personally reviewed patient's past medical/family/social history/allergies/current medications.  Past Medical History:  Diagnosis Date  . Asthma 2008  . COPD (chronic obstructive pulmonary disease) ([place])   . GERD (gastroesophageal reflux disease)   . Multiple gastric ulcers   . Pneumonia      Outpatient Medications Prior to Visit  Medication Sig Dispense Refill  . acetaminophen (TYLENOL) 500 MG tablet Take 500-1,000 mg by mouth every 6 (six) hours as needed.    Marland Kitchen alum & mag hydroxide-simeth (MAALOX/MYLANTA) 200-200-20 MG/5ML suspension Take by mouth 2 (two) times daily as needed for indigestion or heartburn.     . Benralizumab (FASENRA) 30 MG/ML SOSY Inject 1 mL (30 mg total) into the skin every 8 (eight) weeks. 0.28 mL 5  . budesonide-formoterol (SYMBICORT) 160-4.5 MCG/ACT inhaler Inhale 2 puffs into the lungs 3 (three) times daily. 1 Inhaler 6  . chlorpheniramine-HYDROcodone (TUSSIONEX PENNKINETIC ER)  10-8 MG/5ML SUER Take 5 mLs by mouth at bedtime as needed for cough. 140 mL 0  . Famotidine (PEPCID PO) Take 1 tablet by mouth daily.     . fluticasone (FLONASE) 50 MCG/ACT nasal spray USE 1 SPRAY IN EACH NOSTRIL TWICE DAILY 16 g 3  . ipratropium-albuterol (DUONEB) 0.5-2.5 (3) MG/3ML SOLN INHALE 1 VIAL VIA NEBULIZER EVERY 6 HOURS AS NEEDED 360 mL 1  . loratadine (CLARITIN) 10 MG tablet Take 1 tablet (10 mg total) by mouth daily as needed. (Patient taking differently: Take 10 mg by mouth daily. ) 90 tablet 1  . montelukast (SINGULAIR) 10 MG tablet Take 1 tablet (10 mg total) by  mouth at bedtime. 90 tablet 1  . omeprazole (PRILOSEC) 40 MG capsule TAKE 1 Capsule BY MOUTH ONCE EVERY DAY 90 capsule 1  . pantoprazole (PROTONIX) 40 MG tablet Take 1 tablet (40 mg total) by mouth 2 (two) times daily before a meal. 60 tablet 3  . promethazine (PHENERGAN) 25 MG tablet Take 1 tablet (25 mg total) by mouth every 8 (eight) hours as needed for nausea or vomiting. 20 tablet 0  . PROVENTIL HFA 108 (90 Base) MCG/ACT inhaler INHALE 1 TO 2 PUFFS BY MOUTH EVERY 6 HOURS AS NEEDED FOR COUGHING, WHEEZING, OR SHORTNESS OF BREATH 20.1 g prn  . Tiotropium Bromide Monohydrate (SPIRIVA RESPIMAT) 1.25 MCG/ACT AERS Inhale 2 puffs into the lungs daily. 4 g 0  . UNKNOWN TO PATIENT OTC Gas medication    . predniSONE (DELTASONE) 10 MG tablet Take 6 tablets x three days (60 mg), followed by 5 tablets x three days (50mg ), then 4 tablets x three day(40mg ), then 3 tablets (30mg ) x three days, then 2 tablets (20mg ) x three days, then 1 tablet (10mg ) x three days, then STOP 60 tablet 0  . azithromycin (ZITHROMAX Z-PAK) 250 MG tablet Take as directed 1 each 0   No facility-administered medications prior to visit.   Review of Systems  Constitutional: Negative for chills, diaphoresis, fever, malaise/fatigue and weight loss.  HENT: Negative for congestion.   Respiratory: Positive for cough, shortness of breath and wheezing. Negative for hemoptysis and sputum production.   Cardiovascular: Negative for chest pain, palpitations and leg swelling.   Objective:   Vitals:   05/17/20 1047  BP: 128/84  Pulse: 90  Temp: 97.9 F (36.6 C)  TempSrc: Oral  SpO2: 95%  Weight: (!) 329 lb 3.2 oz (149.3 kg)  Height: 5\' 5"  (1.651 m)   SpO2: 95 % O2 Device: None (Room air)   Physical Exam: General: Well-appearing, obese, no acute distress HENT: [place], AT Eyes: EOMI, no scleral icterus Respiratory: Decreased breath sounds bilaterally. Occasional expiratory wheezing. Cardiovascular: RRR, -M/R/G, no  JVD Extremities:-Edema,-tenderness Neuro: AAO x4, CNII-XII grossly intact Skin: Intact, no rashes or bruising Psych: Normal mood, normal affect  Data Reviewed:  Imaging: CXR 11/30/19 - Chronic bronchial thickening  PFT: 10/09/19 FVC 2.46 (64%) FEV1 1.57 (50%) Ratio 62  TLC 97% DLCO 100% Interpretation: Moderately severe obstructive defect. Reduced FVC with normal TLC suggests air trapping. Elevated RV and RV/TLC also suggests air trapping  Labs: SARS-Covid-2 01/17/2019- not detected 10/17/2018 WBC 11.2 with N 55% (6.2) L 32% (3.6) M 7% (0.8) and E 5% (0.5)  Absolute eosinophils 10/17/18 - 500 IgE 02/13/19 - 680  Imaging, labs and tests noted above have been reviewed independently by me.    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  Started on [place] on 10/13/2019.  She has continued to have multiple asthma exacerbations requiring steroid shots and prednisone tapers. She is failing on Fasenra and I recommend a change in therapy to [place] which may be more effective.  Severe persistent allergic asthma with steroid dependence with exacerbation COMPLETE steroid taper CONTINUE Fasenra for now START enrollment for Dupixent Obtain labs: CBC with diff, IgE Repeat Depo shot today CONTINUE Symbicort 160-4.5 mcg TWO puffs THREE times daily CONTINUE Spiriva 1.25 mcg TWO puffs ONCE daily CONTINUE montelukast 10mg  daily CONTINUE Duonebs as needed  Left hip pain Due to chronic prednisone use >5mg  for >3 months and left hip pain, will order DEXA  Work note provided  Asthma Action Plan Increase Symbicort to 2 puffs three times a day for worsening shortness of breath, wheezing and cough. If you symptoms do not improve in 24-48 hours, start steroid pack that has been prescribed. Please call our office for evaluation and to let us know you steroid the steroid pack  (60,50,40,30,20,10 pack).   Health Maintenance Immunization History  Administered Date(s) Administered  . Influenza,inj,Quad PF,6+ Mos 05/04/2018  . PFIZER SARS-COV-2 Vaccination 11/06/2019, 12/22/2019   Orders Placed This Encounter  Procedures  . DG Bone Density    Standing Status:   Future    Standing Expiration Date:   05/17/2021    Scheduling Instructions:     Next available    Order Specific Question:   Reason for Exam (SYMPTOM  OR DIAGNOSIS REQUIRED)    Answer:   Chronic Prednisone Use    Order Specific Question:   Is the patient pregnant?    Answer:   No    Order Specific Question:   Preferred imaging location?    Answer:   [facility]    Order Specific Question:   Release to patient    Answer:   Immediate  . CBC w/Diff    Standing Status:   Future    Number of Occurrences:   1    Standing Expiration Date:   05/17/2021  . IgE    Standing Status:   Future    Number of Occurrences:   1    Standing Expiration Date:   05/17/2021   Meds ordered this encounter  Medications  . methylPREDNISolone acetate (DEPO-MEDROL) injection 80 mg  . predniSONE (DELTASONE) 10 MG tablet    Sig: Take 6 tablets x three days (60 mg), followed by 5 tablets x three days (50mg ), then 4 tablets x three day(40mg ), then 3 tablets (30mg ) x three days, then 2 tablets (20mg ) x three days, then 1 tablet (10mg ) x three days, then STOP    Dispense:  60 tablet    Refill:  0    Return in about 1 month (around 06/17/2020).   I have spent a total time of 40-minutes on the day of the appointment reviewing prior documentation, coordinating care and discussing medical diagnosis and plan with the patient/family. Imaging, labs and tests included in this note have been reviewed and interpreted independently by me.  Rhian Asebedo Rodman Pickle, MD [facility] 05/17/2020 1:32 PM  Office Number [phone]

## 2020-05-17 NOTE — Patient Instructions (Addendum)
Severe persistent allergic asthma with steroid dependence with exacerbation CONTINUE Fasenra for now START enrollment for Dupixent Obtain labs: CBC with diff, IgE Repeat Depo shot today CONTINUE Symbicort 160-4.5 mcg TWO puffs THREE times daily CONTINUE Spiriva 1.25 mcg TWO puffs ONCE daily CONTINUE montelukast 10mg  daily CONTINUE Duonebs as needed Due to chronic prednisone use >5mg  for >3 months, will order DEXA   Asthma Action Plan Increase Symbicort to 2 puffs three times a day for worsening shortness of breath, wheezing and cough. If you symptoms do not improve in 24-48 hours, start steroid pack that has been prescribed. Please call our office for evaluation and to let us know you if start it. I have prescribed you a steroid pack (60,50,40,30,20,10 pack).  Follow-up in 1 month with me

## 2020-05-20 ENCOUNTER — Telehealth: Payer: Self-pay | Admitting: Pulmonary Disease

## 2020-05-20 LAB — IGE: IgE (Immunoglobulin E), Serum: 1274 kU/L — ABNORMAL HIGH (ref <=114)

## 2020-05-20 IMAGING — DX DG CHEST 2V
2 series · 2 of 2 positions shown · non-contrast
Comparison: 09/07/2018

CLINICAL DATA: Cough and asthma wheezing

EXAM:
CHEST - 2 VIEW

[chest pa]
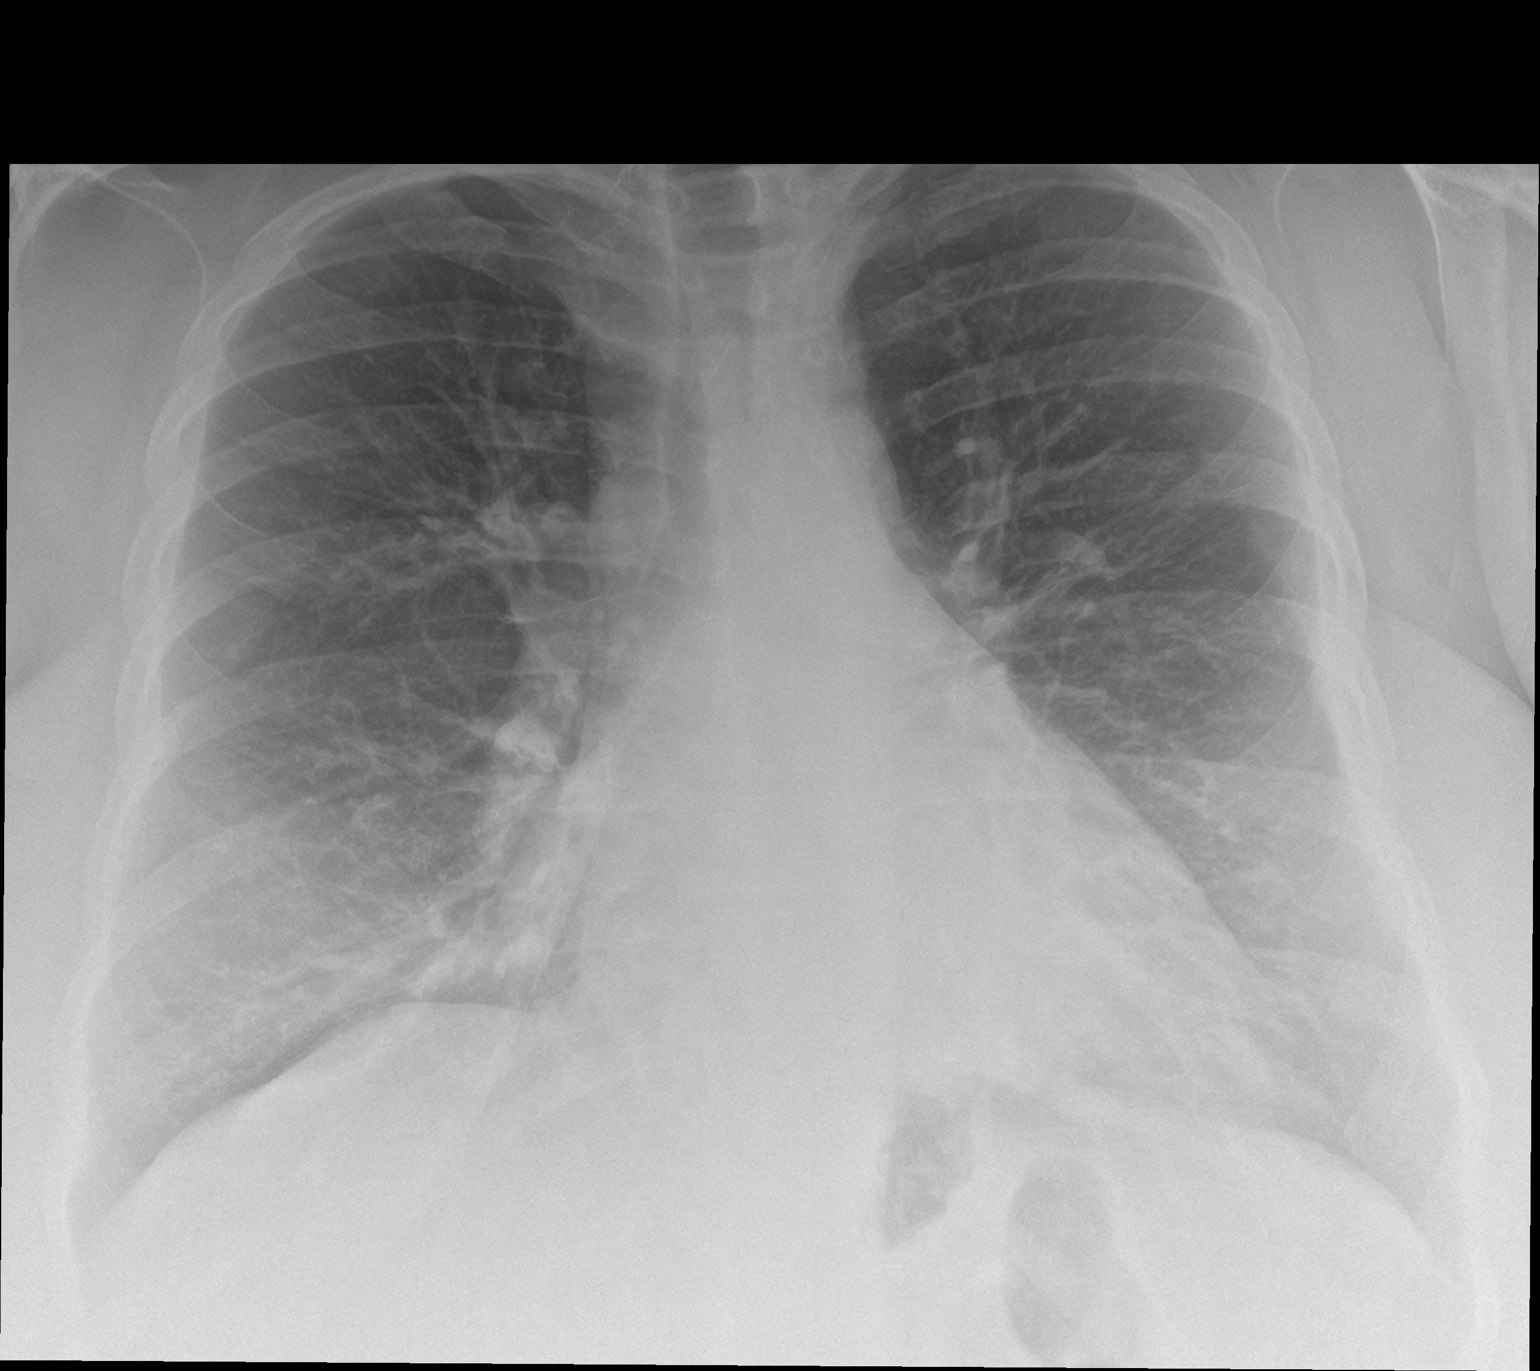

[chest lat]
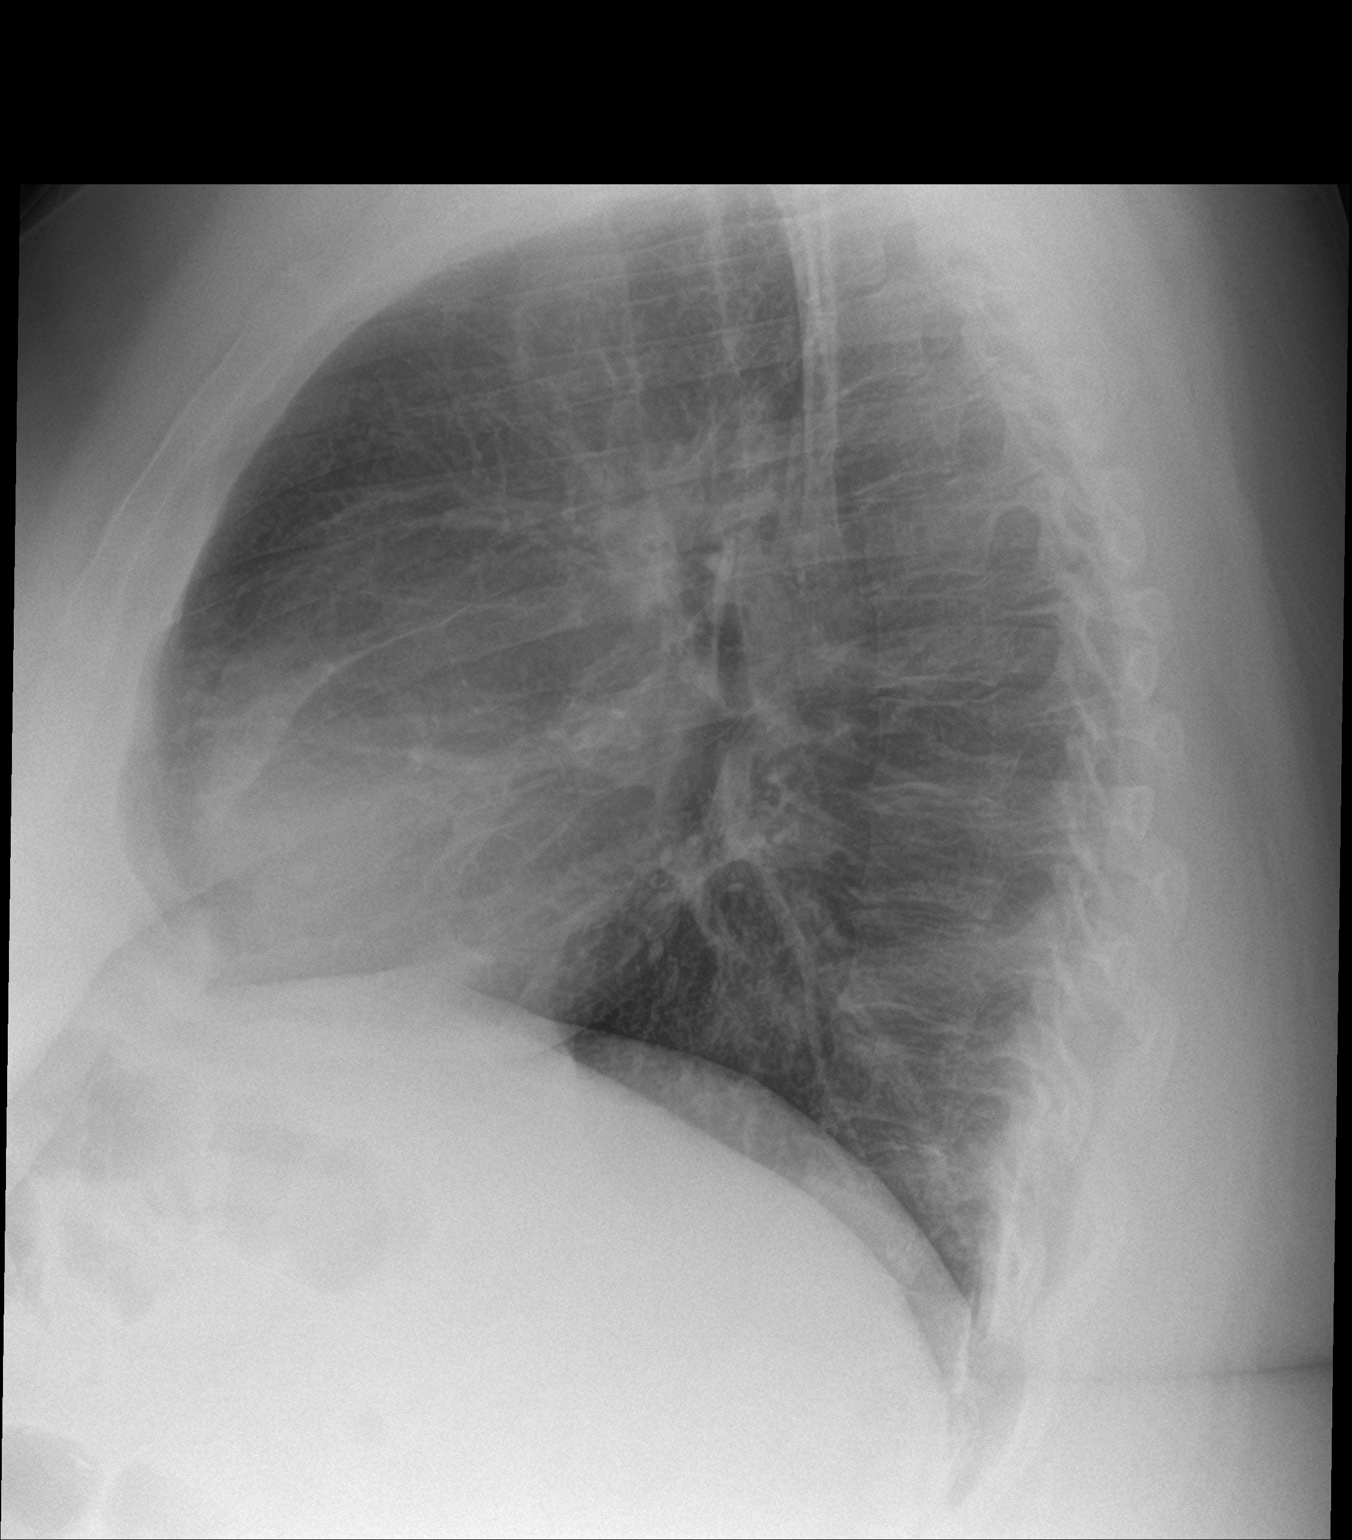

[2 of 2 positions shown; findings below may reference images not displayed]

FINDINGS: Mild cardiac enlargement without heart failure. Streaky lung
markings in the perihilar and lower lobes show mild progression
since the prior study. Negative for pleural effusion.
IMPRESSION: Streaky lung markings in the bases show mild progression and may be
due to atelectasis or pneumonia.

## 2020-05-20 NOTE — Telephone Encounter (Signed)
Berna Bue Order: 30mg  #1 prefilled syringe Ordered date: 05/20/20 Expected date of arrival: 10/21-10/25/21 Ordered by: Carter: [place]

## 2020-05-21 ENCOUNTER — Encounter: Payer: Self-pay | Admitting: Pulmonary Disease

## 2020-05-21 NOTE — Telephone Encounter (Signed)
Received signed [place] documentation. Faxed to [facility], will update once we receive response.  Phone# [phone]

## 2020-05-22 NOTE — Telephone Encounter (Signed)
Berna Bue Shipment Received:  30mg  #1 prefilled syringe Medication arrival date: 05/22/20 Lot #: [id number] Exp date: 07/02/2021 Received by: Elliot Dally

## 2020-05-23 ENCOUNTER — Other Ambulatory Visit: Payer: Self-pay

## 2020-05-23 ENCOUNTER — Encounter: Payer: Self-pay | Admitting: Physician Assistant

## 2020-05-23 ENCOUNTER — Ambulatory Visit: Payer: Medicaid Other | Admitting: Physician Assistant

## 2020-05-23 ENCOUNTER — Other Ambulatory Visit: Payer: Medicaid Other

## 2020-05-23 VITALS — BP 130/81 | HR 98 | Temp 97.4°F | Wt 333.0 lb

## 2020-05-23 DIAGNOSIS — Z13220 Encounter for screening for lipoid disorders: Secondary | ICD-10-CM

## 2020-05-23 DIAGNOSIS — Z20822 Contact with and (suspected) exposure to covid-19: Secondary | ICD-10-CM

## 2020-05-23 DIAGNOSIS — R7303 Prediabetes: Secondary | ICD-10-CM

## 2020-05-23 DIAGNOSIS — K219 Gastro-esophageal reflux disease without esophagitis: Secondary | ICD-10-CM

## 2020-05-23 DIAGNOSIS — J45909 Unspecified asthma, uncomplicated: Secondary | ICD-10-CM

## 2020-05-23 NOTE — Progress Notes (Signed)
BP 130/81    Pulse 98    Temp (!) 97.4 F (36.3 C)    Wt (!) 333 lb (151 kg)    SpO2 96%    BMI 55.41 kg/m    Subjective:    Patient ID: Linda Ellis, female    DOB: Mar 19, 1976, 44 y.o.   MRN: [id number]  HPI: Linda Ellis is a 44 y.o. female presenting on 05/23/2020 for No chief complaint on file.   HPI   Pt had a negative covid 19 screening questionnaire.   Pt is 24yoF with severe asthma followed by pulmonology.  She is here today for follow up GERD.    She is currently using omeprazole and pantoprazole.  She says she is feeling great (in reference to her GERD) and is having no symptoms at all.    Pt is currently taking prednisone and has done much of the past year for her asthma.       Relevant past medical, surgical, family and social history reviewed and updated as indicated. Interim medical history since our last visit reviewed. Allergies and medications reviewed and updated.   Current Outpatient Medications:    Benralizumab (FASENRA) 30 MG/ML SOSY, Inject 1 mL (30 mg total) into the skin every 8 (eight) weeks., Disp: 0.28 mL, Rfl: 5   budesonide-formoterol (SYMBICORT) 160-4.5 MCG/ACT inhaler, Inhale 2 puffs into the lungs 3 (three) times daily., Disp: 1 Inhaler, Rfl: 6   chlorpheniramine-HYDROcodone (TUSSIONEX PENNKINETIC ER) 10-8 MG/5ML SUER, Take 5 mLs by mouth at bedtime as needed for cough., Disp: 140 mL, Rfl: 0   Famotidine (PEPCID PO), Take 1 tablet by mouth daily. , Disp: , Rfl:    fluticasone (FLONASE) 50 MCG/ACT nasal spray, USE 1 SPRAY IN EACH NOSTRIL TWICE DAILY, Disp: 16 g, Rfl: 3   ipratropium-albuterol (DUONEB) 0.5-2.5 (3) MG/3ML SOLN, INHALE 1 VIAL VIA NEBULIZER EVERY 6 HOURS AS NEEDED, Disp: 360 mL, Rfl: 1   loratadine (CLARITIN) 10 MG tablet, Take 1 tablet (10 mg total) by mouth daily as needed. (Patient taking differently: Take 10 mg by mouth daily. ), Disp: 90 tablet, Rfl: 1   montelukast (SINGULAIR) 10 MG tablet, Take 1 tablet (10 mg total)  by mouth at bedtime., Disp: 90 tablet, Rfl: 1   omeprazole (PRILOSEC) 40 MG capsule, TAKE 1 Capsule BY MOUTH ONCE EVERY DAY, Disp: 90 capsule, Rfl: 1   pantoprazole (PROTONIX) 40 MG tablet, Take 1 tablet (40 mg total) by mouth 2 (two) times daily before a meal., Disp: 60 tablet, Rfl: 3   predniSONE (DELTASONE) 10 MG tablet, Take 6 tablets x three days (60 mg), followed by 5 tablets x three days (50mg ), then 4 tablets x three day(40mg ), then 3 tablets (30mg ) x three days, then 2 tablets (20mg ) x three days, then 1 tablet (10mg ) x three days, then STOP, Disp: 60 tablet, Rfl: 0   PROVENTIL HFA 108 (90 Base) MCG/ACT inhaler, INHALE 1 TO 2 PUFFS BY MOUTH EVERY 6 HOURS AS NEEDED FOR COUGHING, WHEEZING, OR SHORTNESS OF BREATH, Disp: 20.1 g, Rfl: prn   Tiotropium Bromide Monohydrate (SPIRIVA RESPIMAT) 1.25 MCG/ACT AERS, Inhale 2 puffs into the lungs daily., Disp: 4 g, Rfl: 0   UNKNOWN TO PATIENT, OTC Gas medication, Disp: , Rfl:    promethazine (PHENERGAN) 25 MG tablet, Take 1 tablet (25 mg total) by mouth every 8 (eight) hours as needed for nausea or vomiting. (Patient not taking: Reported on 05/23/2020), Disp: 20 tablet, Rfl: 0    Review of  Systems  Per HPI unless specifically indicated above     Objective:    BP 130/81    Pulse 98    Temp (!) 97.4 F (36.3 C)    Wt (!) 333 lb (151 kg)    SpO2 96%    BMI 55.41 kg/m   Wt Readings from Last 3 Encounters:  05/23/20 (!) 333 lb (151 kg)  05/17/20 (!) 329 lb 3.2 oz (149.3 kg)  05/03/20 (!) 328 lb 8 oz (149 kg)    Physical Exam Vitals reviewed.  Constitutional:      General: She is not in acute distress.    Appearance: She is well-developed. She is obese.  HENT:     Head: Normocephalic and atraumatic.  Cardiovascular:     Rate and Rhythm: Normal rate and regular rhythm.  Pulmonary:     Effort: No respiratory distress.     Comments: occ scattered exp wheeze Abdominal:     General: Bowel sounds are normal.     Palpations: Abdomen is  soft. There is no mass.     Tenderness: There is no abdominal tenderness.  Musculoskeletal:     Cervical back: Neck supple.  Lymphadenopathy:     Cervical: No cervical adenopathy.  Skin:    General: Skin is warm and dry.  Neurological:     Mental Status: She is alert and oriented to person, place, and time.  Psychiatric:        Attention and Perception: Attention normal.        Speech: Speech normal.        Behavior: Behavior normal. Behavior is cooperative.           Assessment & Plan:   Encounter Diagnoses  Name Primary?   Gastroesophageal reflux disease, unspecified whether esophagitis present Yes   Asthma, unspecified asthma severity, unspecified whether complicated, unspecified whether persistent    Morbid obesity ([place])    Screening cholesterol level    Prediabetes      -discussed with pt that taking 2 ppi is not standard.  However, in light of her steroid use and her symptoms being resoled, will not change anything for now.  When her steroids can be discontinued, will discuss discontinuing one of the ppi's.  Pt states understanding and is in agreement -Check a1c, lipids.  She will be called with results -Follow up 4 months.  She is to contact office sooner prn

## 2020-05-23 NOTE — Progress Notes (Signed)
I previously wanted to transition patient from [place] to [place] however due to her worsening IgE and asthma symptoms, I would like to transition her to Xolair instead. Patient will be in office for her Fasenra injection on 05/27/20. Can we arrange for her to complete the appropriate paperwork to start the enrollment process?

## 2020-05-23 NOTE — Patient Instructions (Signed)
[place] covid testing  [phone].

## 2020-05-24 ENCOUNTER — Telehealth: Payer: Self-pay | Admitting: Pulmonary Disease

## 2020-05-24 ENCOUNTER — Encounter: Payer: Self-pay | Admitting: *Deleted

## 2020-05-24 LAB — NOVEL CORONAVIRUS, NAA: SARS-CoV-2, NAA: NOT DETECTED

## 2020-05-24 LAB — SARS-COV-2, NAA 2 DAY TAT

## 2020-05-24 NOTE — Telephone Encounter (Signed)
Received message from provider that she would like to put patient on Xolair. Placed Xolair patient assistance forms in injection room for patient to sign on her next injection visit.

## 2020-05-24 NOTE — Telephone Encounter (Signed)
Called and spoke with patient, advised Dr. Loanne Drilling stated it was fine to write the letter.  Patient states she was at her pcp office on 05/23/20 with covid like symptoms and was covid tested.  PCP office provided with a generic letter for work simply stating that she was seen in their office, but not that she needed to quarantine.  They are closed on Friday and was unable to request an additional letter today.  Patient states she spoke with Dr. Loanne Drilling and she is aware of the situation.  Letter drafted and loaded to mychart.  Nothing further needed.

## 2020-05-24 NOTE — Telephone Encounter (Signed)
Ok to write note  Rodman Pickle, M.D. [facility] 05/24/2020 3:07 PM

## 2020-05-24 NOTE — Progress Notes (Signed)
Will place Xolair Patient Assistance and New Start forms in injection room.

## 2020-05-24 NOTE — Telephone Encounter (Signed)
ATC patient x1.  Left VM letting patient know that I would have to check with Dr. Loanne Drilling regarding a note taking her out of work.  Dr. Loanne Drilling,  Patient has called and requested a note taking her out of work.  It looks like she was last seen on 05/17/20.  Please advise.  Thank you.

## 2020-05-27 ENCOUNTER — Other Ambulatory Visit: Payer: Self-pay

## 2020-05-27 ENCOUNTER — Ambulatory Visit (INDEPENDENT_AMBULATORY_CARE_PROVIDER_SITE_OTHER): Payer: Self-pay

## 2020-05-27 DIAGNOSIS — Z7952 Long term (current) use of systemic steroids: Secondary | ICD-10-CM

## 2020-05-27 DIAGNOSIS — J4550 Severe persistent asthma, uncomplicated: Secondary | ICD-10-CM

## 2020-05-27 MED ORDER — BENRALIZUMAB 30 MG/ML ~~LOC~~ SOSY
30.0000 mg | PREFILLED_SYRINGE | Freq: Once | SUBCUTANEOUS | Status: AC
  Administered 2020-05-27: 30 mg via SUBCUTANEOUS

## 2020-05-27 NOTE — Telephone Encounter (Signed)
Xolair enrollment signed by Patient and placed in Pharmacy team box.

## 2020-05-27 NOTE — Progress Notes (Signed)
Have you been hospitalized within the last 10 days?  No Do you have a fever?  No Do you have a cough?  No Do you have a headache or sore throat? No Do you have your Epi Pen visible and is it within date?  Yes 

## 2020-05-28 ENCOUNTER — Telehealth: Payer: Self-pay | Admitting: Pulmonary Disease

## 2020-05-28 NOTE — Telephone Encounter (Signed)
Called and spoke with patient who states that her job is not accepting the letter that we given to her by our office because it does not specifically say that she has/had to quarantine while getting covid tested. Patient states that her test came back Negative and is supposed to return to work on 06/02/20. Will write another letter. Nothing further needed at this time.

## 2020-05-28 NOTE — Telephone Encounter (Signed)
Placed forms in MD box for signature

## 2020-05-29 ENCOUNTER — Other Ambulatory Visit: Payer: Self-pay

## 2020-05-29 ENCOUNTER — Other Ambulatory Visit (HOSPITAL_COMMUNITY)
Admission: RE | Admit: 2020-05-29 | Discharge: 2020-05-29 | Disposition: A | Discharge status: Home / Self Care | Payer: Medicaid Other | Source: Ambulatory Visit | Attending: Physician Assistant | Admitting: Physician Assistant

## 2020-05-29 DIAGNOSIS — Z13220 Encounter for screening for lipoid disorders: Secondary | ICD-10-CM | POA: Insufficient documentation

## 2020-05-29 DIAGNOSIS — R7303 Prediabetes: Secondary | ICD-10-CM | POA: Insufficient documentation

## 2020-05-29 LAB — LIPID PANEL
Cholesterol: 250 mg/dL — ABNORMAL HIGH (ref 0–200)
HDL: 103 mg/dL (ref >40)
LDL Cholesterol: 129 mg/dL — ABNORMAL HIGH (ref 0–99)
Total CHOL/HDL Ratio: 2.4 RATIO
Triglycerides: 91 mg/dL (ref <150)
VLDL: 18 mg/dL (ref 0–40)

## 2020-05-29 LAB — COMPREHENSIVE METABOLIC PANEL
ALT: 23 U/L (ref 0–44)
AST: 11 U/L — ABNORMAL LOW (ref 15–41)
Albumin: 3.3 g/dL — ABNORMAL LOW (ref 3.5–5.0)
Alkaline Phosphatase: 84 U/L (ref 38–126)
Anion gap: 8 (ref 5–15)
BUN: 15 mg/dL (ref 6–20)
CO2: 29 mmol/L (ref 22–32)
Calcium: 8.8 mg/dL — ABNORMAL LOW (ref 8.9–10.3)
Chloride: 100 mmol/L (ref 98–111)
Creatinine, Ser: 0.73 mg/dL (ref 0.44–1.00)
GFR, Estimated: >60 mL/min (ref >60)
Glucose, Bld: 97 mg/dL (ref 70–99)
Potassium: 4.2 mmol/L (ref 3.5–5.1)
Sodium: 137 mmol/L (ref 135–145)
Total Bilirubin: 0.7 mg/dL (ref 0.3–1.2)
Total Protein: 6.7 g/dL (ref 6.5–8.1)

## 2020-05-30 LAB — HEMOGLOBIN A1C
Hgb A1c MFr Bld: 6.1 % — ABNORMAL HIGH (ref 4.8–5.6)
Mean Plasma Glucose: 128 mg/dL

## 2020-06-03 ENCOUNTER — Other Ambulatory Visit: Payer: Self-pay | Admitting: Physician Assistant

## 2020-06-03 MED ORDER — NYSTATIN 100000 UNIT/ML MT SUSP: 5.0000 mL | Freq: Four times a day (QID) | OROMUCOSAL | 0 refills | Status: DC

## 2020-06-04 ENCOUNTER — Telehealth: Payer: Self-pay | Admitting: Pulmonary Disease

## 2020-06-04 ENCOUNTER — Other Ambulatory Visit: Payer: Self-pay | Admitting: Physician Assistant

## 2020-06-04 DIAGNOSIS — J4551 Severe persistent asthma with (acute) exacerbation: Secondary | ICD-10-CM

## 2020-06-04 DIAGNOSIS — Z7952 Long term (current) use of systemic steroids: Secondary | ICD-10-CM

## 2020-06-04 NOTE — Telephone Encounter (Signed)
Spoke with pt. She is requesting her Bone Density results. States that she had this done at [facility]. We do not have a copy of this results. Called [facility] and left a message to have them send [place] copy. Will await fax.

## 2020-06-04 NOTE — Telephone Encounter (Signed)
Received signed [place] forms. Faxed to program. Will update once we receive a response.  Phone# [phone]

## 2020-06-07 NOTE — Telephone Encounter (Signed)
Spoke to [place] with [facility], and requested results of bone density.  Will await results.

## 2020-06-11 NOTE — Telephone Encounter (Signed)
Received a fax from  [place] regarding an approval for [facility] patient assistance from 06/11/20 until further notice.   Phone number: [phone]  [facility] will be reaching out to coordinate shipment. Phone- [phone]  Dosing on New Start forms is 375mg  every 2 weeks.

## 2020-06-12 NOTE — Telephone Encounter (Signed)
Located the form.  It is on top of the green look at folder in [place] [facility]

## 2020-06-12 NOTE — Telephone Encounter (Signed)
Lauren, please advise if results were received. Thanks

## 2020-06-13 MED ORDER — OMALIZUMAB 75 MG/0.5ML ~~LOC~~ SOSY: 75.0000 mg | PREFILLED_SYRINGE | SUBCUTANEOUS | 11 refills | Status: DC

## 2020-06-13 MED ORDER — OMALIZUMAB 150 MG/ML ~~LOC~~ SOSY: 300.0000 mg | PREFILLED_SYRINGE | SUBCUTANEOUS | 11 refills | Status: DC

## 2020-06-13 NOTE — Telephone Encounter (Signed)
Xolair 375mg  prescription sent to [facility].  Will follow up with shipment and new start injection appointment.

## 2020-06-13 NOTE — Addendum Note (Signed)
Addended by: Elton Sin on: 06/13/2020 02:29 PM   Modules accepted: Orders

## 2020-06-17 NOTE — Telephone Encounter (Signed)
Called and spoke with Patient. Patient is scheduled 07/11/20 for first Xolair injection at 0900. Patient does not have a Epipen and can not afford a Epipen.  Patient was started for [facility] with no Epipen per Brian,NP instructions. Since Patient is starting Xolair and possible side effects and reactions, I will send a message to the pharmacy team to assist with Epipen assistance.  Message routed to Pharmacy team to assist

## 2020-06-17 NOTE — Telephone Encounter (Signed)
Patient can apply for [facility] Patient Assistance for an Epipen. Called patient and discussed. Sent to patient via email.  [email or web address]

## 2020-06-17 NOTE — Telephone Encounter (Signed)
Xolair Prefilled Syringe Order: 150mg  Prefilled Syringe:  #4 75mg  Prefilled Syringe: #2 Ordered Date: 06/17/20 Expected date of arrival: 07/03/20 Ordered by: [place]: [facility]  Order # [id number]

## 2020-06-18 NOTE — Telephone Encounter (Signed)
Dr. Loanne Drilling, please advise on results. Thank you!

## 2020-06-21 NOTE — Telephone Encounter (Deleted)
Please let patient know we can discuss at next visit as I am currently working inpatient.

## 2020-06-24 ENCOUNTER — Ambulatory Visit (INDEPENDENT_AMBULATORY_CARE_PROVIDER_SITE_OTHER): Payer: Self-pay | Admitting: Pulmonary Disease

## 2020-06-24 ENCOUNTER — Other Ambulatory Visit: Payer: Self-pay

## 2020-06-24 ENCOUNTER — Encounter: Payer: Self-pay | Admitting: Pulmonary Disease

## 2020-06-24 VITALS — BP 120/68 | HR 97 | Temp 98.3°F | Wt 335.2 lb

## 2020-06-24 DIAGNOSIS — M816 Localized osteoporosis [Lequesne]: Secondary | ICD-10-CM | POA: Insufficient documentation

## 2020-06-24 DIAGNOSIS — M810 Age-related osteoporosis without current pathological fracture: Secondary | ICD-10-CM

## 2020-06-24 DIAGNOSIS — Z7952 Long term (current) use of systemic steroids: Secondary | ICD-10-CM

## 2020-06-24 DIAGNOSIS — J4550 Severe persistent asthma, uncomplicated: Secondary | ICD-10-CM

## 2020-06-24 DIAGNOSIS — R059 Cough, unspecified: Secondary | ICD-10-CM

## 2020-06-24 MED ORDER — SPIRIVA RESPIMAT 1.25 MCG/ACT IN AERS: 2.0000 | INHALATION_SPRAY | Freq: Every day | RESPIRATORY_TRACT | 6 refills | Status: DC

## 2020-06-24 MED ORDER — HYDROCOD POLST-CPM POLST ER 10-8 MG/5ML PO SUER: 5.0000 mL | Freq: Every evening | ORAL | 0 refills | Status: DC | PRN

## 2020-06-24 MED ORDER — SPIRIVA RESPIMAT 2.5 MCG/ACT IN AERS: 2.0000 | INHALATION_SPRAY | Freq: Every day | RESPIRATORY_TRACT | 0 refills | Status: DC

## 2020-06-24 NOTE — Patient Instructions (Addendum)
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 If you symptoms do not improve in 24-48 hours, start steroid pack that has been prescribed. Please call our office for evaluation and to let us know you steroid the steroid pack (60,50,40,30,20,10 pack).

## 2020-06-24 NOTE — Progress Notes (Signed)
Subjective:   PATIENT ID: Linda Ellis GENDER: female DOB: [birth year], MRN: [id number]   HPI  Chief Complaint  Patient presents with  . Follow-up    1 month follow up    Reason for Visit: Asthma follow-up  [name] is a 44 year old female former smoker who presents for follow-up of severe persistent asthma with steroid dependence.   Synopsis: Since moving to [place] in 2002, has had symptoms of dyspnea, cough and wheezing. Triggered by exertion, heat and smoke. She has had frequent asthma exacerbations requiring steroid treatment nearly every month of 2019 and 2020 despite being compliant on steroid inhalers. She is steroid dependent. Has had multiple exacerbations in 2019 through 2020. She was previously on [facility] for 3 months however stopped taking due to oral rashes/rashes last month. Has tried Breo (6 months), Qvar (2 years well-controlled, recently on for 2 months and stopped due to ineffectiveness) and Symbicort (1 month discontinued due to cost). She has been on Fasenra since 10/13/19.  After 6 months she has failed Berna Bue and will start Xolair in December 2021  Interval She was last seen by me on 05/17/2020.  She has completed her steroid taper from her asthma exacerbation at the beginning of October.  She does have shortness of breath, cough and wheezing however much improved after treatment.  She no longer works at [employer] due to multiple Covid infections and was concerned about the risk of being in the environment.  She is compliant with her Symbicort and Spiriva.  Using her albuterol as needed which can be up to 5 times a day however this has been improved recently.  In fact this is the best she has felt in the last few months however scores a 9 on her ACT as her symptoms affect her daily.  ACT:  Asthma Control Test ACT Total Score  06/24/2020 9  05/17/2020 6  05/03/2020 7   Steroids Received in 2019 and 2020 2019 Jan Feb March April May June July Aug Sept Oct Nov Dec    X X XX XX  XX X XX XXX  XX XXX  2020 Jan Feb March April May June July Aug Sept Oct Nov Dec   X X XXXX   X XXX  X   XX  2021 Jan Feb March April May June July Aug Sept Oct Nov Dec    X Fasenra XX Junius Argyle XX XX  Xolair  2022 Jan Feb March April May June July Aug Sept Oct Nov Dec                 Social History: Social smoker 31 pack-years, started at age 67. 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  Past Medical History:  Diagnosis Date  . Asthma 2008  . COPD (chronic obstructive pulmonary disease) ([place])   . GERD (gastroesophageal reflux disease)   . Multiple gastric ulcers   . Pneumonia      Outpatient Medications Prior to Visit  Medication Sig Dispense Refill  . budesonide-formoterol (SYMBICORT) 160-4.5 MCG/ACT inhaler Inhale 2 puffs into the lungs 3 (three) times daily. 1 Inhaler 6  . Famotidine (PEPCID PO) Take 1 tablet by mouth daily.     . fluticasone (FLONASE) 50 MCG/ACT nasal spray  USE 1 SPRAY IN EACH NOSTRIL TWICE DAILY 16 g 3  . ipratropium-albuterol (DUONEB) 0.5-2.5 (3) MG/3ML SOLN INHALE 1 VIAL VIA NEBULIZER EVERY 6 HOURS AS NEEDED 360 mL 1  . loratadine (CLARITIN) 10 MG tablet TAKE 1 Tablet BY MOUTH ONCE EVERY DAY AS NEEDED 90 tablet 1  . montelukast (SINGULAIR) 10 MG tablet TAKE 1 Tablet BY MOUTH ONCE EVERY NIGHT AT BEDTIME 90 tablet 1  . omalizumab (XOLAIR) 150 MG/ML prefilled syringe Inject 300 mg into the skin every 14 (fourteen) days. 4 mL 11  . omalizumab (XOLAIR) 75 MG/0.5ML prefilled syringe Inject 75 mg into the skin every 14 (fourteen) days. 1 mL 11  . omeprazole (PRILOSEC) 40 MG capsule TAKE 1 Capsule BY MOUTH ONCE EVERY DAY 90 capsule 1  . pantoprazole (PROTONIX) 40 MG tablet Take 1 tablet (40 mg  total) by mouth 2 (two) times daily before a meal. 60 tablet 3  . promethazine (PHENERGAN) 25 MG tablet Take 1 tablet (25 mg total) by mouth every 8 (eight) hours as needed for nausea or vomiting. 20 tablet 0  . PROVENTIL HFA 108 (90 Base) MCG/ACT inhaler INHALE 1 TO 2 PUFFS BY MOUTH EVERY 6 HOURS AS NEEDED FOR COUGHING, WHEEZING, OR SHORTNESS OF BREATH 20.1 g prn  . UNKNOWN TO PATIENT OTC Gas medication    . Tiotropium Bromide Monohydrate (SPIRIVA RESPIMAT) 1.25 MCG/ACT AERS Inhale 2 puffs into the lungs daily. 4 g 0  . chlorpheniramine-HYDROcodone (TUSSIONEX PENNKINETIC ER) 10-8 MG/5ML SUER Take 5 mLs by mouth at bedtime as needed for cough. (Patient not taking: Reported on 06/24/2020) 140 mL 0  . nystatin (MYCOSTATIN) 100000 UNIT/ML suspension Take 5 mLs (500,000 Units total) by mouth 4 (four) times daily. (Patient not taking: Reported on 06/24/2020) 60 mL 0  . predniSONE (DELTASONE) 10 MG tablet Take 6 tablets x three days (60 mg), followed by 5 tablets x three days (50mg ), then 4 tablets x three day(40mg ), then 3 tablets (30mg ) x three days, then 2 tablets (20mg ) x three days, then 1 tablet (10mg ) x three days, then STOP 60 tablet 0   No facility-administered medications prior to visit.   Review of Systems  Constitutional: Negative for chills, diaphoresis, fever, malaise/fatigue and weight loss.  HENT: Negative for congestion.   Respiratory: Positive for cough, shortness of breath and wheezing. Negative for hemoptysis and sputum production.   Cardiovascular: Negative for chest pain, palpitations and leg swelling.   Objective:   Vitals:   06/24/20 1047  BP: 120/68  Pulse: 97  Temp: 98.3 F (36.8 C)  TempSrc: Tympanic  SpO2: 96%  Weight: (!) 335 lb 4 oz (152.1 kg)   SpO2: 96 %   Physical Exam: General: Well-appearing, obese, no acute distress HENT: [place], AT Eyes: EOMI, no scleral icterus Respiratory: Minimal expiratory wheeze.  No rhonchi. Cardiovascular: RRR, -M/R/G, no  JVD Extremities:-Edema,-tenderness Neuro: AAO x4, CNII-XII grossly intact Skin: Intact, no rashes or bruising Psych: Normal mood, normal affect  Data Reviewed:  Imaging: CXR 11/30/19 - Chronic bronchial thickening  PFT: 10/09/19 FVC 2.46 (64%) FEV1 1.57 (50%) Ratio 62  TLC 97% DLCO 100% Interpretation: Moderately severe obstructive defect. Reduced FVC with normal TLC suggests air trapping. Elevated RV and RV/TLC also suggests air trapping  Labs: SARS-Covid-2 01/17/2019- not detected 10/17/2018 WBC 11.2 with N 55% (6.2) L 32% (3.6) M 7% (0.8) and E 5% (0.5)  Absolute eosinophils 10/17/18 - 500 IgE 02/13/19 - 680  05/17/20 Abs eos 100 05/17/20 IgE 1274  Imaging, labs and  tests noted above have been reviewed independently by me.    Assessment & Plan:   Discussion: 44 year old female former smoker who presents for follow-up of severe persistent eosinophilic asthma steroid-dependent.  Failed Fasenra.  Will transition to Xolair due to significantly elevated IgE.  Also reviewed her bone density test with her today.  This study shows osteoporosis mainly involving L1-L3 of the spine.  This is likely related to her frequent steroid use.  Unfortunately she will not be able to stop steroids until we have better control of her asthma.  She will need a referral to endocrine to consider advanced therapy.  We will start calcium vitamin D supplements in the interim.   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 If you symptoms do not improve in 24-48 hours, start steroid pack that has been prescribed. Please call our office for evaluation and to let us know you steroid the steroid pack (60,50,40,30,20,10 pack).  Health Maintenance Immunization History  Administered Date(s) Administered  . Influenza,inj,Quad PF,6+ Mos 05/04/2018  . PFIZER SARS-COV-2 Vaccination 11/06/2019, 12/22/2019   Orders Placed This Encounter  Procedures  . Ambulatory referral to Endocrinology    Referral Priority:   Routine    Referral Type:   Consultation    Referral Reason:   Specialty Services Required    Number of Visits Requested:   1   Meds ordered this encounter  Medications  . chlorpheniramine-HYDROcodone (TUSSIONEX PENNKINETIC ER) 10-8 MG/5ML SUER    Sig: Take 5 mLs by mouth at bedtime as needed for cough.    Dispense:  140 mL    Refill:  0  . Tiotropium Bromide Monohydrate (SPIRIVA RESPIMAT) 2.5 MCG/ACT AERS    Sig: Inhale 2 puffs into the lungs daily.    Dispense:  1 each    Refill:  0  . Tiotropium Bromide Monohydrate (SPIRIVA RESPIMAT) 1.25 MCG/ACT AERS    Sig: Inhale 2 puffs into the lungs daily.    Dispense:  4 g    Refill:  6    Order Specific Question:   Lot Number?    Answer:   [id number]    Order Specific Question:   Expiration Date?    Answer:   04/03/2021    Order Specific Question:   Quantity    Answer:   4    Return in about 2 months (around 08/24/2020).   I have spent a total time of 33-minutes on the day of the appointment reviewing prior documentation, coordinating care and discussing medical diagnosis and plan with the patient/family. Imaging, labs and tests included in this note have been reviewed and interpreted independently by me.   [place], MD [facility] 06/24/2020 3:13 PM  Office Number [phone]

## 2020-06-25 ENCOUNTER — Ambulatory Visit (INDEPENDENT_AMBULATORY_CARE_PROVIDER_SITE_OTHER): Payer: Self-pay | Admitting: "Endocrinology

## 2020-06-25 ENCOUNTER — Encounter: Payer: Self-pay | Admitting: "Endocrinology

## 2020-06-25 VITALS — BP 108/82 | HR 84 | Ht 65.0 in | Wt 339.2 lb

## 2020-06-25 DIAGNOSIS — R7303 Prediabetes: Secondary | ICD-10-CM | POA: Insufficient documentation

## 2020-06-25 DIAGNOSIS — M816 Localized osteoporosis [Lequesne]: Secondary | ICD-10-CM

## 2020-06-25 NOTE — Patient Instructions (Signed)

## 2020-06-25 NOTE — Progress Notes (Signed)
06/25/2020      Endocrinology Consult Note  Past Medical History:  Diagnosis Date  . Asthma 2008  . COPD (chronic obstructive pulmonary disease) ([place])   . GERD (gastroesophageal reflux disease)   . Multiple gastric ulcers   . Pneumonia    Past Surgical History:  Procedure Laterality Date  . CHOLECYSTECTOMY    . DIAGNOSTIC LAPAROSCOPY WITH REMOVAL OF ECTOPIC PREGNANCY Right 01/24/2017   Procedure: REMOVAL OF ECTOPIC PREGNANCY;  Surgeon: Florian Buff, MD;  Location: [facility];  Service: Gynecology;  Laterality: Right;  . LAPAROSCOPIC UNILATERAL SALPINGECTOMY Right 01/24/2017   Procedure: LAPAROSCOPIC UNILATERAL SALPINGECTOMY;  Surgeon: Florian Buff, MD;  Location: [facility];  Service: Gynecology;  Laterality: Right;   Social History   Socioeconomic History  . Marital status: Single    Spouse name: Not on file  . Number of children: Not on file  . Years of education: Not on file  . Highest education level: Not on file  Occupational History  . Not on file  Tobacco Use  . Smoking status: Former Smoker    Packs/day: 1.00    Years: 31.00    Pack years: 31.00    Types: Cigarettes    Start date: 1989    Quit date: 02/24/2019    Years since quitting: 1.3  . Smokeless tobacco: Never Used  Vaping Use  . Vaping Use: Never used  Substance and Sexual Activity  . Alcohol use: Yes    Comment: occasionally  . Drug use: No  . Sexual activity: Not Currently    Birth control/protection: None  Other Topics Concern  . Not on file  Social History Narrative  . Not on file   Social Determinants of Health   Financial Resource Strain:   . Difficulty of Paying Living Expenses: Not on file  Food Insecurity:   . Worried About Charity fundraiser in the Last Year: Not on file  . Ran Out of Food in the Last Year: Not on file  Transportation Needs:   . Lack of Transportation (Medical): Not on file  . Lack of Transportation  (Non-Medical): Not on file  Physical Activity:   . Days of Exercise per Week: Not on file  . Minutes of Exercise per Session: Not on file  Stress:   . Feeling of Stress : Not on file  Social Connections:   . Frequency of Communication with Friends and Family: Not on file  . Frequency of Social Gatherings with Friends and Family: Not on file  . Attends Religious Services: Not on file  . Active Member of Clubs or Organizations: Not on file  . Attends Archivist Meetings: Not on file  . Marital Status: Not on file   Outpatient Encounter Medications as of 06/25/2020  Medication Sig  . budesonide-formoterol (SYMBICORT) 160-4.5 MCG/ACT inhaler Inhale 2 puffs into the lungs 3 (three) times daily.  . chlorpheniramine-HYDROcodone (TUSSIONEX PENNKINETIC ER) 10-8 MG/5ML SUER Take 5 mLs by mouth at bedtime as needed for cough.  . Famotidine (PEPCID PO) Take 1 tablet by mouth daily.   . fluticasone (FLONASE) 50 MCG/ACT nasal spray USE 1 SPRAY IN EACH NOSTRIL  TWICE DAILY  . ipratropium-albuterol (DUONEB) 0.5-2.5 (3) MG/3ML SOLN INHALE 1 VIAL VIA NEBULIZER EVERY 6 HOURS AS NEEDED  . loratadine (CLARITIN) 10 MG tablet TAKE 1 Tablet BY MOUTH ONCE EVERY DAY AS NEEDED  . montelukast (SINGULAIR) 10 MG tablet TAKE 1 Tablet BY MOUTH ONCE EVERY NIGHT AT BEDTIME  . omalizumab [place] Right) 150 MG/ML prefilled syringe Inject 300 mg into the skin every 14 (fourteen) days.  Marland Kitchen omalizumab Arvid Right) 75 MG/0.5ML prefilled syringe Inject 75 mg into the skin every 14 (fourteen) days.  Marland Kitchen omeprazole (PRILOSEC) 40 MG capsule TAKE 1 Capsule BY MOUTH ONCE EVERY DAY  . pantoprazole (PROTONIX) 40 MG tablet Take 1 tablet (40 mg total) by mouth 2 (two) times daily before a meal.  . promethazine (PHENERGAN) 25 MG tablet Take 1 tablet (25 mg total) by mouth every 8 (eight) hours as needed for nausea or vomiting.  Marland Kitchen PROVENTIL HFA 108 (90 Base) MCG/ACT inhaler INHALE 1 TO 2 PUFFS BY MOUTH EVERY 6 HOURS AS NEEDED FOR COUGHING,  WHEEZING, OR SHORTNESS OF BREATH  . Tiotropium Bromide Monohydrate (SPIRIVA RESPIMAT) 1.25 MCG/ACT AERS Inhale 2 puffs into the lungs daily.  . Tiotropium Bromide Monohydrate (SPIRIVA RESPIMAT) 2.5 MCG/ACT AERS Inhale 2 puffs into the lungs daily.  Marland Kitchen UNKNOWN TO PATIENT OTC Gas medication  . [DISCONTINUED] ALBUTEROL IN Inhale 2 puffs into the lungs as needed. For shortness of breath    No facility-administered encounter medications on file as of 06/25/2020.   ALLERGIES: No Known Allergies  VACCINATION STATUS: Immunization History  Administered Date(s) Administered  . Influenza,inj,Quad PF,6+ Mos 05/04/2018  . PFIZER SARS-COV-2 Vaccination 11/06/2019, 12/22/2019     HPI   Linda Ellis is 44 y.o. female who presents today with a medical history as above. she is being seen in consultation for osteoporosis requested by [name], PA-C. History is obtained directly from the patient and chart review. Her medical history is significant for exposure to high-dose steroids related to her COPD which in turn is related to her prior history of heavy smoking. She took prednisone up to 60 mg daily, several times yearly for the last 2 decades. Patient was diagnosed with osteoporosis on recent bone density performed on May 21, 2020.  Her Z score on L1-L4 was -2.9, T score -3.2. -She is not initiated on any antiosteoporosis medication. She denies any history of fragility fractures. -She is not on any calcium or vitamin D supplements. Her most recent labs show hypoalbuminemia related mild hypocalcemia of 8.8.  She also eats dairy and green, leafy, vegetables.  She does not take high vitamin A doses. No h/o hyper/hypocalcemia. No h/o hyperparathyroidism. No history of thyroid dysfunction. No h/o kidney stones. No h/o thyrotoxicosis.   No history of  CKD.  She still has regular menstrual cycles. She has no family history of premenopausal osteoporosis.   I reviewed her chart and she also has  a history of COPD which requires multiple bronchodilators, as well as recent initiation of omalizumab. She also has prediabetes, morbid obesity, hyperlipidemia.  Review of Systems  Constitutional: + Progressive weight gain + fatigue, no subjective hyperthermia, no subjective hypothermia Eyes: no blurry vision, no xerophthalmia ENT: no sore throat, no nodules palpated in throat, no dysphagia/odynophagia, no hoarseness Cardiovascular: no Chest Pain, no Shortness of Breath, no palpitations, no leg swelling Respiratory: no cough, no SOB Gastrointestinal: no Nausea/Vomiting/Diarhhea Musculoskeletal: no muscle/joint aches Skin: no rashes Neurological: no tremors, no numbness, no tingling, no dizziness Psychiatric: no depression, no anxiety  Objective:    BP 108/82   Pulse 84   Ht 5\' 5"  (1.651 m)   Wt (!) 339 lb 3.2 oz (153.9 kg)   BMI 56.45 kg/m   Wt Readings from Last 3 Encounters:  06/25/20 (!) 339 lb 3.2 oz (153.9 kg)  06/24/20 (!) 335 lb 4 oz (152.1 kg)  05/23/20 (!) 333 lb (151 kg)    Physical Exam  Constitutional:  + BMI of 56.4 weight for height, not in acute distress, normal state of mind, + cushingoid appearance Eyes: PERRLA, EOMI, no exophthalmos ENT: moist mucous membranes, no thyromegaly, no cervical lymphadenopathy Cardiovascular: normal precordial activity, Regular Rate and Rhythm, no Murmur/Rubs/Gallops, + bilateral supraclavicular fullness, +dorsal cervical fat pad. Respiratory:  adequate breathing efforts, no gross chest deformity, Clear to auscultation bilaterally Gastrointestinal: abdomen soft, + obese, Non -tender, No distension, Bowel Sounds present Musculoskeletal: no gross deformities, strength intact in all four extremities Skin: moist, warm, no rashes Neurological: no tremor with outstretched hands, Deep tendon reflexes normal in all four extremities.  CMP ( most recent) CMP     Component Value Date/Time   NA 137 05/29/2020 0838   K 4.2 05/29/2020 0838    CL 100 05/29/2020 0838   CO2 29 05/29/2020 0838   GLUCOSE 97 05/29/2020 0838   BUN 15 05/29/2020 0838   CREATININE 0.73 05/29/2020 0838   CALCIUM 8.8 (L) 05/29/2020 0838   PROT 6.7 05/29/2020 0838   ALBUMIN 3.3 (L) 05/29/2020 0838   AST 11 (L) 05/29/2020 0838   ALT 23 05/29/2020 0838   ALKPHOS 84 05/29/2020 0838   BILITOT 0.7 05/29/2020 0838   GFRNONAA >60 05/29/2020 0838   GFRAA >60 08/23/2019 1125     Diabetic Labs (most recent): Lab Results  Component Value Date   HGBA1C 6.1 (H) 05/29/2020   HGBA1C 5.8 (H) 08/23/2019     Lipid Panel ( most recent) Lipid Panel     Component Value Date/Time   CHOL 250 (H) 05/29/2020 0838   TRIG 91 05/29/2020 0838   HDL 103 05/29/2020 0838   CHOLHDL 2.4 05/29/2020 0838   VLDL 18 05/29/2020 0838   LDLCALC 129 (H) 05/29/2020 0838     Bone density from May 21, 2020 Right femur neck T score +0.0, Z score +0.3 Left femoral neck T score -0.3, Z score +0.0 AP L1, L3 and L4 T score -3.2, Z score -2.9  No comparison available.   Assessment: 1. Glucocorticoid induced osteoporosis 2. Morbid obesity 3. Prediabetes 4. Dyslipidemia   Plan: 1. Premenopausal osteoporosis -She had significant abnormality in her bone density although no comparison with  previous studies, it is  likely glucocorticoid induced.  It is possible that she never achieved optimal bone density.    She gives history of heavy exposure to steroids related to COPD/asthma which in turn is related to her history of smoking for 30+ years before she quit in 2020.  Given her T score of -3, Z score -2.9 , she has increased risk of fracture.  She will benefit from early initiation of intervention including with bisphosphonates. She will need some basic work-up to rule out secondary causes of osteoporosis. She will need thyroid function test, vitamin D level, PTH/calcium, and will return in 1-2 weeks for reevaluation.   - We discussed about the different medication classes,  benefits and side effects (including atypical fractures and ONJ - no dental workup in progress or planned).  - we reviewed her dietary and supplemental calcium and vitamin D intake. -  She will likely need calcium/vitamin D supplement.  -I discussed fall precautions to avoid fractures. -If work-up for secondary cause of osteoporosis are negative, she will be initiated on alendronate with precautions. She will be considered for repeat bone density in October 2023.  Regarding her prediabetes/morbid obesity: - she  admits there is a room for improvement in her diet and drink choices. -  Suggestion is made for her to avoid simple carbohydrates  from her diet including Cakes, Sweet Desserts / Pastries, Ice Cream, Soda (diet and regular), Sweet Tea, Candies, Chips, Cookies, Sweet Pastries,  Store Bought Juices, Alcohol in Excess of  1-2 drinks a day, Artificial Sweeteners, Coffee Creamer, and "Sugar-free" Products. This will help patient to have stable blood glucose profile and potentially avoid unintended weight gain.  She will also benefit from annual injection with statin medications.  This will be considered during her next visit.  - I did not initiate any new prescriptions today. - I advised patient to maintain close follow up with [name], PA-C for primary care needs.  - Time spent with the patient: 60 minutes, of which >50% was spent in obtaining information about her symptoms, reviewing her previous labs, evaluations, and treatments, counseling her about her osteoporosis, morbid obesity, prediabetes, dyslipidemia, and developing a plan to confirm the diagnosis and long term treatment as necessary.  [name] participated in the discussions, expressed understanding, and voiced agreement with the above plans.  All questions were answered to her satisfaction. she is encouraged to contact clinic should she have any questions or concerns prior to her return visit.  Follow up plan: Return  in about 10 days (around 07/05/2020) for Labs Today- Non-Fasting Ok.   Glade Lloyd, MD [facility] [address] [place], [place] [address] Phone: [phone]  Fax: [phone]     06/25/2020, 8:58 PM  This note was partially dictated with voice recognition software. Similar sounding words can be transcribed inadequately or may not  be corrected upon review.

## 2020-06-26 ENCOUNTER — Encounter: Payer: Self-pay | Admitting: "Endocrinology

## 2020-06-26 LAB — MAGNESIUM: Magnesium: 2 mg/dL (ref 1.6–2.3)

## 2020-06-26 LAB — T4, FREE: Free T4: 1.11 ng/dL (ref 0.82–1.77)

## 2020-06-26 LAB — PHOSPHORUS: Phosphorus: 3.3 mg/dL (ref 3.0–4.3)

## 2020-06-26 LAB — PTH, INTACT AND CALCIUM
Calcium: 9 mg/dL (ref 8.7–10.2)
PTH: 53 pg/mL (ref 15–65)

## 2020-06-26 LAB — TSH: TSH: 1.71 u[IU]/mL (ref 0.450–4.500)

## 2020-06-26 LAB — VITAMIN D 25 HYDROXY (VIT D DEFICIENCY, FRACTURES): Vit D, 25-Hydroxy: 7.6 ng/mL — ABNORMAL LOW (ref 30.0–100.0)

## 2020-07-01 ENCOUNTER — Other Ambulatory Visit: Payer: Self-pay | Admitting: Physician Assistant

## 2020-07-01 ENCOUNTER — Other Ambulatory Visit: Payer: Self-pay | Admitting: Pulmonary Disease

## 2020-07-01 DIAGNOSIS — J4550 Severe persistent asthma, uncomplicated: Secondary | ICD-10-CM

## 2020-07-03 NOTE — Telephone Encounter (Signed)
Xolair Prefilled Syringe Received:  150mg  Prefilled Syringe >> quantity #4, lot # [id number], exp date 07/02/2021 75mg  Prefilled Syringe >> quantity #2, lot # [id number], exp date 07/02/21 Medication arrival date: 07/03/20 Received by: Candler Ginsberg,LPN

## 2020-07-05 NOTE — Telephone Encounter (Signed)
Received Epi-pen patient assistance it was faxed to program on 11/23 without MD signature. Placed in provider box for signature.

## 2020-07-08 ENCOUNTER — Ambulatory Visit (INDEPENDENT_AMBULATORY_CARE_PROVIDER_SITE_OTHER): Payer: Self-pay | Admitting: "Endocrinology

## 2020-07-08 ENCOUNTER — Other Ambulatory Visit: Payer: Self-pay

## 2020-07-08 ENCOUNTER — Encounter: Payer: Self-pay | Admitting: "Endocrinology

## 2020-07-08 ENCOUNTER — Telehealth: Payer: Self-pay

## 2020-07-08 VITALS — BP 100/64 | HR 88 | Ht 65.0 in

## 2020-07-08 DIAGNOSIS — E559 Vitamin D deficiency, unspecified: Secondary | ICD-10-CM | POA: Insufficient documentation

## 2020-07-08 DIAGNOSIS — M816 Localized osteoporosis [Lequesne]: Secondary | ICD-10-CM

## 2020-07-08 DIAGNOSIS — R7303 Prediabetes: Secondary | ICD-10-CM

## 2020-07-08 MED ORDER — ALENDRONATE SODIUM 70 MG PO TABS: 70.0000 mg | ORAL_TABLET | ORAL | 3 refills | Status: DC

## 2020-07-08 MED ORDER — CALCIUM CARB-CHOLECALCIFEROL 500-600 MG-UNIT PO TABS: 1.0000 | ORAL_TABLET | Freq: Two times a day (BID) | ORAL | 1 refills | Status: DC

## 2020-07-08 MED ORDER — VITAMIN D (ERGOCALCIFEROL) 1.25 MG (50000 UNIT) PO CAPS: 50000.0000 [IU] | ORAL_CAPSULE | ORAL | 0 refills | Status: DC

## 2020-07-08 NOTE — Telephone Encounter (Signed)
Patient said she needs her Fosamax sent to  [facility], [place], [place] Phone:  [phone]  Fax:  [phone]

## 2020-07-08 NOTE — Telephone Encounter (Signed)
Sent in

## 2020-07-08 NOTE — Patient Instructions (Signed)

## 2020-07-08 NOTE — Telephone Encounter (Signed)
Rx for fosamax already sent to [facility].

## 2020-07-08 NOTE — Progress Notes (Signed)
07/08/2020      Endocrinology follow-up note   Past Medical History:  Diagnosis Date  . Asthma 2008  . COPD (chronic obstructive pulmonary disease) ([place])   . GERD (gastroesophageal reflux disease)   . Multiple gastric ulcers   . Pneumonia    Past Surgical History:  Procedure Laterality Date  . CHOLECYSTECTOMY    . DIAGNOSTIC LAPAROSCOPY WITH REMOVAL OF ECTOPIC PREGNANCY Right 01/24/2017   Procedure: REMOVAL OF ECTOPIC PREGNANCY;  Surgeon: Florian Buff, MD;  Location: [facility];  Service: Gynecology;  Laterality: Right;  . LAPAROSCOPIC UNILATERAL SALPINGECTOMY Right 01/24/2017   Procedure: LAPAROSCOPIC UNILATERAL SALPINGECTOMY;  Surgeon: Florian Buff, MD;  Location: [facility];  Service: Gynecology;  Laterality: Right;   Social History   Socioeconomic History  . Marital status: Single    Spouse name: Not on file  . Number of children: Not on file  . Years of education: Not on file  . Highest education level: Not on file  Occupational History  . Not on file  Tobacco Use  . Smoking status: Former Smoker    Packs/day: 1.00    Years: 31.00    Pack years: 31.00    Types: Cigarettes    Start date: 1989    Quit date: 02/24/2019    Years since quitting: 1.3  . Smokeless tobacco: Never Used  Vaping Use  . Vaping Use: Never used  Substance and Sexual Activity  . Alcohol use: Yes    Comment: occasionally  . Drug use: No  . Sexual activity: Not Currently    Birth control/protection: None  Other Topics Concern  . Not on file  Social History Narrative  . Not on file   Social Determinants of Health   Financial Resource Strain:   . Difficulty of Paying Living Expenses: Not on file  Food Insecurity:   . Worried About Charity fundraiser in the Last Year: Not on file  . Ran Out of Food in the Last Year: Not on file  Transportation Needs:   . Lack of Transportation (Medical): Not on file  . Lack of Transportation  (Non-Medical): Not on file  Physical Activity:   . Days of Exercise per Week: Not on file  . Minutes of Exercise per Session: Not on file  Stress:   . Feeling of Stress : Not on file  Social Connections:   . Frequency of Communication with Friends and Family: Not on file  . Frequency of Social Gatherings with Friends and Family: Not on file  . Attends Religious Services: Not on file  . Active Member of Clubs or Organizations: Not on file  . Attends Archivist Meetings: Not on file  . Marital Status: Not on file   Outpatient Encounter Medications as of 07/08/2020  Medication Sig  . alendronate (FOSAMAX) 70 MG tablet Take 1 tablet (70 mg total) by mouth once a week. Take with a full glass of water on an empty stomach.  . budesonide-formoterol (SYMBICORT) 160-4.5 MCG/ACT inhaler Inhale 2 puffs into the lungs 3 (three) times daily.  . Calcium Carb-Cholecalciferol 500-600 MG-UNIT TABS Take 1 tablet by mouth 2 (two) times daily  with a meal.  . chlorpheniramine-HYDROcodone (TUSSIONEX PENNKINETIC ER) 10-8 MG/5ML SUER Take 5 mLs by mouth at bedtime as needed for cough.  . Famotidine (PEPCID PO) Take 1 tablet by mouth daily.   . fluticasone (FLONASE) 50 MCG/ACT nasal spray USE 1 SPRAY IN EACH NOSTRIL TWICE DAILY  . ipratropium-albuterol (DUONEB) 0.5-2.5 (3) MG/3ML SOLN INHALE 1 VIAL VIA NEBULIZER EVERY 6 HOURS AS NEEDED  . loratadine (CLARITIN) 10 MG tablet TAKE 1 Tablet BY MOUTH ONCE EVERY DAY AS NEEDED  . montelukast (SINGULAIR) 10 MG tablet TAKE 1 Tablet BY MOUTH ONCE EVERY NIGHT AT BEDTIME  . omalizumab Arvid Right) 150 MG/ML prefilled syringe Inject 300 mg into the skin every 14 (fourteen) days.  Marland Kitchen omalizumab Arvid Right) 75 MG/0.5ML prefilled syringe Inject 75 mg into the skin every 14 (fourteen) days.  Marland Kitchen omeprazole (PRILOSEC) 40 MG capsule TAKE 1 Capsule BY MOUTH ONCE EVERY DAY  . pantoprazole (PROTONIX) 40 MG tablet TAKE 1 TABLET BY MOUTH TWICE DAILY BEFORE A MEAL  . promethazine  (PHENERGAN) 25 MG tablet Take 1 tablet (25 mg total) by mouth every 8 (eight) hours as needed for nausea or vomiting.  Marland Kitchen PROVENTIL HFA 108 (90 Base) MCG/ACT inhaler INHALE 1 TO 2 PUFFS BY MOUTH EVERY 6 HOURS AS NEEDED FOR COUGHING, WHEEZING, OR SHORTNESS OF BREATH  . Tiotropium Bromide Monohydrate (SPIRIVA RESPIMAT) 1.25 MCG/ACT AERS Inhale 2 puffs into the lungs daily.  . Tiotropium Bromide Monohydrate (SPIRIVA RESPIMAT) 2.5 MCG/ACT AERS Inhale 2 puffs into the lungs daily.  Marland Kitchen UNKNOWN TO PATIENT OTC Gas medication  . Vitamin D, Ergocalciferol, (DRISDOL) 1.25 MG (50000 UNIT) CAPS capsule Take 1 capsule (50,000 Units total) by mouth every 7 (seven) days.  . [DISCONTINUED] ALBUTEROL IN Inhale 2 puffs into the lungs as needed. For shortness of breath    No facility-administered encounter medications on file as of 07/08/2020.   ALLERGIES: No Known Allergies  VACCINATION STATUS: Immunization History  Administered Date(s) Administered  . Influenza,inj,Quad PF,6+ Mos 05/04/2018  . PFIZER SARS-COV-2 Vaccination 11/06/2019, 12/22/2019     HPI   Linda Ellis is 44 y.o. female who is returning with repeat labs after she was seen in consultation for osteoporosis.   -PCP:  [name], PA-C. See notes from previous visit. History is obtained directly from the patient and chart review. Her medical history is significant for exposure to high-dose steroids related to her COPD which in turn is related to her prior history of heavy smoking. She took prednisone up to 60 mg daily, several times yearly for the last 2 decades. Patient was diagnosed with osteoporosis on recent bone density performed on May 21, 2020.  Her Z score on L1-L4 was -2.9, T score -3.2. -She is not initiated on any antiosteoporosis medication. She denies any history of fragility fractures. -Her previsit labs confirm profound vitamin D deficiency.   Her most recent labs show hypoalbuminemia related mild hypocalcemia of 8.8.   She also eats dairy and green, leafy, vegetables.  She does not take high vitamin A doses. No h/o hyper/hypocalcemia. No h/o hyperparathyroidism. No history of thyroid dysfunction. No h/o kidney stones. No h/o thyrotoxicosis.   No history of  CKD.  She still has regular menstrual cycles. She has no family history of premenopausal osteoporosis.   I reviewed her chart and she also has a history of COPD which requires multiple bronchodilators, as well as recent initiation of omalizumab. She also has prediabetes, morbid obesity, hyperlipidemia.  Review of Systems  Constitutional: + Progressive  weight gain + fatigue, no subjective hyperthermia, no subjective hypothermia Eyes: no blurry vision, no xerophthalmia ENT: no sore throat, no nodules palpated in throat, no dysphagia/odynophagia, no hoarseness Cardiovascular: no Chest Pain, no Shortness of Breath, no palpitations, no leg swelling Respiratory: no cough, no SOB Gastrointestinal: no Nausea/Vomiting/Diarhhea Musculoskeletal: no muscle/joint aches Skin: no rashes Neurological: no tremors, no numbness, no tingling, no dizziness Psychiatric: no depression, no anxiety  Objective:    BP 100/64   Pulse 88   Ht 5\' 5"  (1.651 m)   BMI 56.45 kg/m   Wt Readings from Last 3 Encounters:  06/25/20 (!) 339 lb 3.2 oz (153.9 kg)  06/24/20 (!) 335 lb 4 oz (152.1 kg)  05/23/20 (!) 333 lb (151 kg)    Physical Exam  Constitutional:  + BMI of 56.4 weight for height, not in acute distress, normal state of mind, + cushingoid appearance Eyes: PERRLA, EOMI, no exophthalmos ENT: moist mucous membranes, no thyromegaly, no cervical lymphadenopathy Cardiovascular: normal precordial activity, Regular Rate and Rhythm, no Murmur/Rubs/Gallops, + bilateral supraclavicular fullness, +dorsal cervical fat pad. Respiratory:  adequate breathing efforts, no gross chest deformity, Clear to auscultation bilaterally Gastrointestinal: abdomen soft, + obese, Non -tender,  No distension, Bowel Sounds present Musculoskeletal: no gross deformities, strength intact in all four extremities Skin: moist, warm, no rashes Neurological: no tremor with outstretched hands, Deep tendon reflexes normal in all four extremities.  CMP ( most recent) CMP     Component Value Date/Time   NA 137 05/29/2020 0838   K 4.2 05/29/2020 0838   CL 100 05/29/2020 0838   CO2 29 05/29/2020 0838   GLUCOSE 97 05/29/2020 0838   BUN 15 05/29/2020 0838   CREATININE 0.73 05/29/2020 0838   CALCIUM 9.0 06/25/2020 1041   PROT 6.7 05/29/2020 0838   ALBUMIN 3.3 (L) 05/29/2020 0838   AST 11 (L) 05/29/2020 0838   ALT 23 05/29/2020 0838   ALKPHOS 84 05/29/2020 0838   BILITOT 0.7 05/29/2020 0838   GFRNONAA >60 05/29/2020 0838   GFRAA >60 08/23/2019 1125   Recent Results (from the past 2160 hour(s))  Novel Coronavirus, NAA ([facility])     Status: None   Collection Time: 04/25/20 12:00 AM   Specimen: Nasopharyngeal(NP) swabs in vial transport medium   Nasopharynge  Screenin  Result Value Ref Range   SARS-CoV-2, NAA Not Detected Not Detected    Comment: This nucleic acid amplification test was developed and its performance characteristics determined by [employer]. Nucleic acid amplification tests include RT-PCR and TMA. This test has not been FDA cleared or approved. This test has been authorized by FDA under an Emergency Use Authorization (EUA). This test is only authorized for the duration of time the declaration that circumstances exist justifying the authorization of the emergency use of in vitro diagnostic tests for detection of SARS-CoV-2 virus and/or diagnosis of COVID-19 infection under section 564(b)(1) of the Act, 21 U.S.C. 948NIO-2(V) (1), unless the authorization is terminated or revoked sooner. When diagnostic testing is negative, the possibility of a false negative result should be considered in the context of a patient's recent exposures and the presence of  clinical signs and symptoms consistent with COVID-19. An individual without symptoms of COVID-19 and who is not shedding SARS-CoV-2 virus wo uld expect to have a negative (not detected) result in this assay.   SARS-COV-2, NAA 2 DAY TAT     Status: None   Collection Time: 04/25/20 12:00 AM   Nasopharynge  Screenin  Result Value Ref Range  SARS-CoV-2, NAA 2 DAY TAT Performed   Specimen status report     Status: None   Collection Time: 04/25/20 12:00 AM  Result Value Ref Range   specimen status report Comment     Comment: Please note Please note The date and/or time of collection was not indicated on the requisition as required by state and federal law.  The date of receipt of the specimen was used as the collection date if not supplied.   Urinalysis, Routine w reflex microscopic     Status: Abnormal   Collection Time: 05/09/20  9:57 AM  Result Value Ref Range   Color, Urine YELLOW YELLOW   APPearance HAZY (A) CLEAR   Specific Gravity, Urine 1.015 1.005 - 1.030   pH 5.0 5.0 - 8.0   Glucose, UA NEGATIVE NEGATIVE mg/dL   Hgb urine dipstick NEGATIVE NEGATIVE   Bilirubin Urine NEGATIVE NEGATIVE   Ketones, ur NEGATIVE NEGATIVE mg/dL   Protein, ur NEGATIVE NEGATIVE mg/dL   Nitrite NEGATIVE NEGATIVE   Leukocytes,Ua TRACE (A) NEGATIVE   RBC / HPF 0-5 0 - 5 RBC/hpf   WBC, UA 6-10 0 - 5 WBC/hpf   Bacteria, UA NONE SEEN NONE SEEN   Squamous Epithelial / LPF 6-10 0 - 5   Mucus PRESENT     Comment: Performed at [facility], [address], [place], [place] [address]  Urine Culture     Status: Abnormal   Collection Time: 05/09/20  9:57 AM   Specimen: Urine, Clean Catch  Result Value Ref Range   Specimen Description      URINE, CLEAN CATCH Performed at [facility], [address] [place], [place] [address]    Special Requests      NONE Performed at [facility], [address], [place], [place] [address]    Culture MULTIPLE SPECIES PRESENT, SUGGEST RECOLLECTION (A)    Report  Status 05/10/2020 FINAL   IgE     Status: Abnormal   Collection Time: 05/17/20 11:27 AM  Result Value Ref Range   IgE (Immunoglobulin E), Serum 1,274 (H) <OR=114 kU/L  CBC w/Diff     Status: Abnormal   Collection Time: 05/17/20 11:27 AM  Result Value Ref Range   WBC 17.4 (H) 4.0 - 10.5 K/uL   RBC 4.47 3.87 - 5.11 Mil/uL   Hemoglobin 13.6 12.0 - 15.0 g/dL   HCT 40.4 36 - 46 %   MCV 90.2 78.0 - 100.0 fl   MCHC 33.7 30.0 - 36.0 g/dL   RDW 14.9 11.5 - 15.5 %   Platelets 438.0 (H) 150 - 400 K/uL   Neutrophils Relative % 60.6 43 - 77 %   Lymphocytes Relative 30.0 12 - 46 %   Monocytes Relative 7.5 3 - 12 %   Eosinophils Relative 0.4 0 - 5 %   Basophils Relative 1.5 0 - 3 %   Neutro Abs 10.6 (H) 1.4 - 7.7 K/uL   Lymphs Abs 5.2 (H) 0.7 - 4.0 K/uL   Monocytes Absolute 1.3 (H) 0.1 - 1.0 K/uL   Eosinophils Absolute 0.1 0.0 - 0.7 K/uL   Basophils Absolute 0.3 (H) 0.0 - 0.1 K/uL  Novel Coronavirus, NAA ([facility])     Status: None   Collection Time: 05/23/20  9:41 AM   Specimen: Nasopharyngeal(NP) swabs in vial transport medium   Nasopharynge  Screenin  Result Value Ref Range   SARS-CoV-2, NAA Not Detected Not Detected    Comment: This nucleic acid amplification test was developed and its performance  characteristics determined by [employer]. Nucleic acid amplification tests include RT-PCR and TMA. This test has not been FDA cleared or approved. This test has been authorized by FDA under an Emergency Use Authorization (EUA). This test is only authorized for the duration of time the declaration that circumstances exist justifying the authorization of the emergency use of in vitro diagnostic tests for detection of SARS-CoV-2 virus and/or diagnosis of COVID-19 infection under section 564(b)(1) of the Act, 21 U.S.C. 244WNU-2(V) (1), unless the authorization is terminated or revoked sooner. When diagnostic testing is negative, the possibility of a false negative result should be  considered in the context of a patient's recent exposures and the presence of clinical signs and symptoms consistent with COVID-19. An individual without symptoms of COVID-19 and who is not shedding SARS-CoV-2 virus wo uld expect to have a negative (not detected) result in this assay.   SARS-COV-2, NAA 2 DAY TAT     Status: None   Collection Time: 05/23/20  9:41 AM   Nasopharynge  Screenin  Result Value Ref Range   SARS-CoV-2, NAA 2 DAY TAT Performed   Hemoglobin A1c     Status: Abnormal   Collection Time: 05/29/20  8:38 AM  Result Value Ref Range   Hgb A1c MFr Bld 6.1 (H) 4.8 - 5.6 %    Comment: (NOTE)         Prediabetes: 5.7 - 6.4         Diabetes: >6.4         Glycemic control for adults with diabetes: <7.0    Mean Plasma Glucose 128 mg/dL    Comment: (NOTE) Performed At: [facility] [place], [place] [id number] Rush Farmer MD [id number]   Lipid panel     Status: Abnormal   Collection Time: 05/29/20  8:38 AM  Result Value Ref Range   Cholesterol 250 (H) 0 - 200 mg/dL   Triglycerides 91 <150 mg/dL   HDL 103 >40 mg/dL   Total CHOL/HDL Ratio 2.4 RATIO   VLDL 18 0 - 40 mg/dL   LDL Cholesterol 129 (H) 0 - 99 mg/dL    Comment:        Total Cholesterol/HDL:CHD Risk Coronary Heart Disease Risk Table                     Men   Women  1/2 Average Risk   3.4   3.3  Average Risk       5.0   4.4  2 X Average Risk   9.6   7.1  3 X Average Risk  23.4   11.0        Use the calculated Patient Ratio above and the CHD Risk Table to determine the patient's CHD Risk.        ATP III CLASSIFICATION (LDL):  <100     mg/dL   Optimal  100-129  mg/dL   Near or Above                    Optimal  130-159  mg/dL   Borderline  160-189  mg/dL   High  >190     mg/dL   Very High Performed at [facility], [address] [place], [place] [address]   Comprehensive metabolic panel     Status: Abnormal   Collection Time: 05/29/20  8:38 AM  Result Value Ref Range    Sodium 137 135 - 145 mmol/L   Potassium 4.2 3.5 -  5.1 mmol/L   Chloride 100 98 - 111 mmol/L   CO2 29 22 - 32 mmol/L   Glucose, Bld 97 70 - 99 mg/dL    Comment: Glucose reference range applies only to samples taken after fasting for at least 8 hours.   BUN 15 6 - 20 mg/dL   Creatinine, Ser 0.73 0.44 - 1.00 mg/dL   Calcium 8.8 (L) 8.9 - 10.3 mg/dL   Total Protein 6.7 6.5 - 8.1 g/dL   Albumin 3.3 (L) 3.5 - 5.0 g/dL   AST 11 (L) 15 - 41 U/L   ALT 23 0 - 44 U/L   Alkaline Phosphatase 84 38 - 126 U/L   Total Bilirubin 0.7 0.3 - 1.2 mg/dL   GFR, Estimated >60 >60 mL/min    Comment: (NOTE) Calculated using the CKD-EPI Creatinine Equation (2021)    Anion gap 8 5 - 15    Comment: Performed at [facility], [address], [place], [place] [address]  PTH, intact and calcium     Status: None   Collection Time: 06/25/20 10:41 AM  Result Value Ref Range   Calcium 9.0 8.7 - 10.2 mg/dL   PTH 53 15 - 65 pg/mL   PTH Interp Comment     Comment: Interpretation                 Intact PTH    Calcium                                 (pg/mL)      (mg/dL) Normal                          15 - 65     8.6 - 10.2 Primary Hyperparathyroidism         >65          >10.2 Secondary Hyperparathyroidism       >65          <10.2 Non-Parathyroid Hypercalcemia       <65          >10.2 Hypoparathyroidism                  <15          < 8.6 Non-Parathyroid Hypocalcemia    15 - 65          < 8.6   Magnesium     Status: None   Collection Time: 06/25/20 10:41 AM  Result Value Ref Range   Magnesium 2.0 1.6 - 2.3 mg/dL  Phosphorus     Status: None   Collection Time: 06/25/20 10:41 AM  Result Value Ref Range   Phosphorus 3.3 3.0 - 4.3 mg/dL  VITAMIN D 25 Hydroxy (Vit-D Deficiency, Fractures)     Status: Abnormal   Collection Time: 06/25/20 10:41 AM  Result Value Ref Range   Vit D, 25-Hydroxy 7.6 (L) 30.0 - 100.0 ng/mL    Comment: Vitamin D deficiency has been defined by the [place] and an Endocrine  Society practice guideline as a level of serum 25-OH vitamin D less than 20 ng/mL (1,2). The Endocrine Society went on to further define vitamin D insufficiency as a level between 21 and 29 ng/mL (2). 1. IOM (Institute of Medicine). 2010. Dietary reference    intakes for calcium and D. [place]: The    [employer]. 2. Holick MF,  Binkley [place], Bischoff-Ferrari HA, et al.    Evaluation, treatment, and prevention of vitamin D    deficiency: an Endocrine Society clinical practice    guideline. JCEM. 2011 Jul; 96(7):1911-30.   TSH     Status: None   Collection Time: 06/25/20 10:41 AM  Result Value Ref Range   TSH 1.710 0.450 - 4.500 uIU/mL  T4, free     Status: None   Collection Time: 06/25/20 10:41 AM  Result Value Ref Range   Free T4 1.11 0.82 - 1.77 ng/dL    Diabetic Labs (most recent): Lab Results  Component Value Date   HGBA1C 6.1 (H) 05/29/2020   HGBA1C 5.8 (H) 08/23/2019     Lipid Panel ( most recent) Lipid Panel     Component Value Date/Time   CHOL 250 (H) 05/29/2020 0838   TRIG 91 05/29/2020 0838   HDL 103 05/29/2020 0838   CHOLHDL 2.4 05/29/2020 0838   VLDL 18 05/29/2020 0838   LDLCALC 129 (H) 05/29/2020 0838     Bone density from May 21, 2020 Right femur neck T score +0.0, Z score +0.3 Left femoral neck T score -0.3, Z score +0.0 AP L1, L3 and L4 T score -3.2, Z score -2.9  No comparison available.   Results for ANAHITA, CUA (MRN [id number]) as of 07/08/2020 12:50  Ref. Range 06/25/2020 10:41  Calcium Latest Ref Range: 8.7 - 10.2 mg/dL 9.0  Phosphorus Latest Ref Range: 3.0 - 4.3 mg/dL 3.3  Magnesium Latest Ref Range: 1.6 - 2.3 mg/dL 2.0  Vitamin D, 25-Hydroxy Latest Ref Range: 30.0 - 100.0 ng/mL 7.6 (L)  PTH, Intact Latest Ref Range: 15 - 65 pg/mL 53  PTH Interp Unknown Comment  TSH Latest Ref Range: 0.450 - 4.500 uIU/mL 1.710  T4,Free(Direct) Latest Ref Range: 0.82 - 1.77 ng/dL 1.11     Assessment: 1. Glucocorticoid induced  osteoporosis 2. Morbid obesity 3. Prediabetes 4. Dyslipidemia 5.  Vitamin D deficiency   Plan: 1. Premenopausal osteoporosis -She had significant abnormality in her bone density although no comparison with  previous studies, it is  likely glucocorticoid induced.  It is possible that she never achieved optimal bone density.  I discussed her recent labs, previous labs, previous imaging studies.    She gives history of heavy exposure to steroids related to COPD/asthma which in turn is related to her history of smoking for 30+ years before she quit in 2020.  Given her T score of -3, Z score -2.9 , she has increased risk of fracture.  She will benefit from early initiation of intervention including with bisphosphonates. -Her previsit work-up showed profound vitamin D deficiency, however rules out major secondary cause of osteoporosis.   She will benefit from early initiation of bisphosphonate therapy.  I discussed and initiated Fosamax 70 mg p.o. weekly with plan to repeat bone density in October 2023.  Side effects and precautions discussed with her.    In light of her profound vitamin D  , she is approached for high-dose vitamin D2 intervention.  I discussed and prescribed ergocalciferol 50,000 units weekly for 12 weeks.    She will be continued on calcium carbonate/vitamin D 500/600 mg p.o. daily afterwards.   -I discussed fall precautions to avoid fractures.  Regarding her prediabetes/morbid obesity: - she  admits there is a room for improvement in her diet and drink choices. -  Suggestion is made for her to avoid simple carbohydrates  from her diet including Cakes, Sweet Desserts / Pastries, Ice Cream,  Soda (diet and regular), Sweet Tea, Candies, Chips, Cookies, Sweet Pastries,  Store Bought Juices, Alcohol in Excess of  1-2 drinks a day, Artificial Sweeteners, Coffee Creamer, and "Sugar-free" Products. This will help patient to have stable blood glucose profile and potentially avoid  unintended weight gain.  - I advised patient to maintain close follow up with [name], PA-C for primary care needs.    - Time spent on this patient care encounter:  30 minutes of which 50% was spent in  counseling and the rest reviewing  her current and  previous labs / studies and medications  doses and developing a plan for long term care. [name]  participated in the discussions, expressed understanding, and voiced agreement with the above plans.  All questions were answered to her satisfaction. she is encouraged to contact clinic should she have any questions or concerns prior to her return visit.  Follow up plan: Return in about 6 months (around 01/06/2021) for F/U with Pre-visit Labs.   Glade Lloyd, MD [facility] [address] [place], [place] [address] Phone: [phone]  Fax: [phone]     07/08/2020, 12:48 PM  This note was partially dictated with voice recognition software. Similar sounding words can be transcribed inadequately or may not  be corrected upon review.

## 2020-07-11 ENCOUNTER — Telehealth: Payer: Self-pay | Admitting: Pulmonary Disease

## 2020-07-11 ENCOUNTER — Ambulatory Visit: Payer: Medicaid Other

## 2020-07-11 ENCOUNTER — Other Ambulatory Visit: Payer: Self-pay

## 2020-07-11 DIAGNOSIS — J4550 Severe persistent asthma, uncomplicated: Secondary | ICD-10-CM

## 2020-07-11 NOTE — Progress Notes (Unsigned)
Patient came in for first Xolair injection.  Patient had not received her Epipen.  Per previous notes from pharmacy team,Patient Epipen application was not signed by Dr. Loanne Drilling, but was placed in her box on C pod to sign on 07/05/20. Epipen application was in Dr. Cordelia Pen box unsigned.  Per office policy Patient must have Epipen to start any biologic. Patient rescheduled for 07/25/20 at 0900 to start Xolair injections. Epipen assistance application completed and faxed to [employer].

## 2020-07-11 NOTE — Telephone Encounter (Signed)
Patient came in for first Xolair injection.  Patient had not received her Epipen.  Per previous notes from pharmacy team,Patient Epipen application was not signed by Dr. Loanne Drilling, but was placed in her box on C pod to sign on 07/05/20. Epipen application was in Dr. Cordelia Pen box unsigned.  Per office policy Patient must have Epipen to start any biologic. Patient rescheduled for 07/25/20 at 0900 to start Xolair injections.

## 2020-07-17 ENCOUNTER — Telehealth: Payer: Self-pay | Admitting: Pulmonary Disease

## 2020-07-17 MED ORDER — PREDNISONE 10 MG PO TABS: ORAL_TABLET | ORAL | 0 refills | Status: DC

## 2020-07-17 NOTE — Telephone Encounter (Signed)
Based on review of Dr. Cordelia Pen notes I would give patient a prednisone taper as below, back down to her usual maintenance 5 mg daily.  Have her continue her same inhaled medications.  She needs to call us if she is not improving with this intervention.  Agree with getting back on her Xolair when feasible to do so.  Pred >> Take 40mg  daily for 3 days, then 30mg  daily for 3 days, then 20mg  daily for 3 days, then 10mg  daily for 3 days, then back to her usual 5 mg daily

## 2020-07-17 NOTE — Telephone Encounter (Signed)
Primary Pulmonologist: Dr. Loanne Drilling Last office visit and with whom: 06/24/2020  Loanne Drilling What do we see them for (pulmonary problems): Asthma Last OV assessment/plan:  Assessment & Plan:   Discussion: 44 year old female former smoker who presents for follow-up of severe persistent eosinophilic asthma steroid-dependent.  Failed Fasenra.  Will transition to Xolair due to significantly elevated IgE.  Also reviewed her bone density test with her today.  This study shows osteoporosis mainly involving L1-L3 of the spine.  This is likely related to her frequent steroid use.  Unfortunately she will not be able to stop steroids until we have better control of her asthma.  She will need a referral to endocrine to consider advanced therapy.  We will start calcium vitamin D supplements in the interim.   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 If you symptoms do not improve in 24-48 hours, start steroid pack that has been prescribed. Please call our office for evaluation and to let us know you steroid the steroid pack (60,50,40,30,20,10 pack).  Health Maintenance     Immunization History  Administered Date(s) Administered  . Influenza,inj,Quad PF,6+ Mos 05/04/2018  . PFIZER SARS-COV-2 Vaccination 11/06/2019, 12/22/2019        Orders Placed This Encounter  Procedures  . Ambulatory referral to Endocrinology    Referral Priority:   Routine    Referral Type:    Consultation    Referral Reason:   Specialty Services Required    Number of Visits Requested:   1        Meds ordered this encounter  Medications  . chlorpheniramine-HYDROcodone (TUSSIONEX PENNKINETIC ER) 10-8 MG/5ML SUER    Sig: Take 5 mLs by mouth at bedtime as needed for cough.    Dispense:  140 mL    Refill:  0  . Tiotropium Bromide Monohydrate (SPIRIVA RESPIMAT) 2.5 MCG/ACT AERS    Sig: Inhale 2 puffs into the lungs daily.    Dispense:  1 each    Refill:  0  . Tiotropium Bromide Monohydrate (SPIRIVA RESPIMAT) 1.25 MCG/ACT AERS    Sig: Inhale 2 puffs into the lungs daily.    Dispense:  4 g    Refill:  6    Order Specific Question:   Lot Number?    Answer:   [id number]    Order Specific Question:   Expiration Date?    Answer:   04/03/2021    Order Specific Question:   Quantity    Answer:   4    Return in about 2 months (around 08/24/2020).   I have spent a total time of 33-minutes on the day of the appointment reviewing prior documentation, coordinating care and discussing medical diagnosis and plan with the patient/family. Imaging, labs and tests included in this note have been reviewed and interpreted independently by me.   [place], MD [facility] 06/24/2020 3:13 PM  Office Number [phone]        Patient Instructions by Margaretha Seeds, MD at 06/24/2020 10:45 AM  Author:  Margaretha Seeds, MD Author Type: Physician Filed: 06/24/2020 11:24 AM  Note Status: Addendum Cosign: Cosign Not Required Encounter Date: 06/24/2020  Editor: Margaretha Seeds, MD (Physician)      Prior Versions: 1. 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 If you symptoms do not improve in 24-48 hours, start steroid pack that has been prescribed. Please call our office for evaluation and to let us know you steroid the steroid pack (60,50,40,30,20,10 pack).         Instructions     Return in about 2 months (around 08/24/2020).  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 If you symptoms do not improve in 24-48 hours, start steroid pack that has been prescribed. Please call our office for evaluation and to let us know you steroid the steroid pack (60,50,40,30,20,10 pack).          Reason for call: Coughing for about 3 days.  Coughing so hard this morning she vomiting.  Sob all day.  Used nebulizer 4 times today and not clearing lungs.  Coughed up some brown thick mucus early this am.  Denies fever, chill, or body aches.  States she is wheezing, nebulizer does not help with wheezing.  Did not get Xolair on 07/11/20, because she did not have an Epipen related to some paperwork.  Next appointment is on 07/25/20  She has not had a Xolair injection in more than a month.  She has had her covid vaccines, but has not had her flu vaccines.  She is  asking for prednisone.  Dr. Lamonte Sakai, please advise.  (examples of things to ask: : When did symptoms start? Fever? Cough? Productive? Color to sputum? More sputum than usual? Wheezing? Have you needed increased oxygen? Are you taking your respiratory medications? What over the counter measures have you tried?)  No Known Allergies  Immunization History  Administered Date(s) Administered  . Influenza,inj,Quad PF,6+ Mos 05/04/2018  . PFIZER SARS-COV-2 Vaccination 11/06/2019, 12/22/2019

## 2020-07-17 NOTE — Telephone Encounter (Signed)
Called and spoke with patient to let her know that we are sending in RX for Prednisone taper. Advised when is done with taper she would resume her normal 5 mg daily. If patient was not feeling any better then she need to come into office to be seen and possibly discuss going back on Xolair. Patient expressed understanding. Nothing further needed at this time.

## 2020-07-22 ENCOUNTER — Ambulatory Visit: Payer: Medicaid Other

## 2020-07-25 ENCOUNTER — Other Ambulatory Visit: Payer: Self-pay

## 2020-07-25 ENCOUNTER — Ambulatory Visit: Payer: Medicaid Other

## 2020-07-25 ENCOUNTER — Telehealth: Payer: Self-pay | Admitting: Pulmonary Disease

## 2020-07-25 NOTE — Telephone Encounter (Signed)
Patient came in office today to receive first Xolair injection. Patient does not have epipen.  Epipen assistance form was faxed 07/11/20.  Patient stated Linda Ellis has not heard any updates and assumed Epipen was shipped to office. Patient can not start Xolair until Linda Ellis receives Epipen per office policy protocol. Will reschedule injection after Epipen is received by Patient.  Message routed to Pharmacy Team to assist in [place] assistance

## 2020-07-25 NOTE — Telephone Encounter (Signed)
Called [employer], patient's application was received and is still pending. Rep states that they did come out with han update application since the patients was received. Printed new app and Wyn Quaker signed. Faxed to program.  Rep estimates that it will be about 72 business hours to process. Will follow up next week.  Phone# [phone]

## 2020-07-29 ENCOUNTER — Telehealth: Payer: Self-pay | Admitting: Pulmonary Disease

## 2020-07-29 DIAGNOSIS — J4550 Severe persistent asthma, uncomplicated: Secondary | ICD-10-CM

## 2020-07-29 MED ORDER — PREDNISONE 10 MG PO TABS: ORAL_TABLET | ORAL | 0 refills | Status: DC
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
## 2020-07-29 NOTE — Telephone Encounter (Signed)
07/29/2020  We will send in:  Prednisone 10mg  tablet  >>>4 tabs for 3 days, then 3 tabs for 3 days, 2 tabs for 3 days, then 1 tab for 3 days, then stop >>>take with food  >>>take in the morning   If symptoms worsen patient will need to seek emergent evaluation in the emergency room or urgent care.  Keep upcoming appointment in January/2022.  February/2022, FNP

## 2020-07-29 NOTE — Telephone Encounter (Signed)
rx sent to preferred pharmacy.  Pt aware.  Routing back to injection pool to follow up on biologic.  Thanks!

## 2020-07-29 NOTE — Telephone Encounter (Signed)
Patient Linda Ellis is here, but Epipen assistance is needed for Patient to receive epipen.  See other encounters 07/11/20 and  07/25/20 related to form and assistance for epipen. At this time assistance for Epipen is pending and pharmacy team is working on it. Updates will be in 07/25/20 open encounter.

## 2020-08-01 NOTE — Telephone Encounter (Signed)
Per [employer], still in review.  Will follow up on Monday

## 2020-08-05 NOTE — Telephone Encounter (Signed)
Still awaiting approval from [employer].

## 2020-08-07 NOTE — Telephone Encounter (Signed)
Approved through 08/05/2021. Will receive epipen within 5-10 business day.  Awaiting approval letter via fax.

## 2020-08-07 NOTE — Telephone Encounter (Signed)
Patient should receive epi-pen in 5-10 business days. Called patient and she will reach out to schedule injection once she receives Epi-pen.

## 2020-08-08 ENCOUNTER — Ambulatory Visit: Payer: Medicaid Other | Admitting: Physician Assistant

## 2020-08-08 ENCOUNTER — Other Ambulatory Visit: Payer: Self-pay

## 2020-08-08 VITALS — Temp 97.2°F

## 2020-08-08 DIAGNOSIS — J449 Chronic obstructive pulmonary disease, unspecified: Secondary | ICD-10-CM

## 2020-08-08 DIAGNOSIS — Z23 Encounter for immunization: Secondary | ICD-10-CM

## 2020-08-08 DIAGNOSIS — J45909 Unspecified asthma, uncomplicated: Secondary | ICD-10-CM

## 2020-08-08 NOTE — Progress Notes (Signed)
Pt is here to update pneumococcal vaccination.  Pt's temperature today is 97.36F. pt is feeling well today. Allergies reviewed.  Pneumovax23 administered to pt's L deltoid on today 08-08-20 at 1:35PM. Reading material given to pt on AVS.

## 2020-08-08 NOTE — Patient Instructions (Signed)
Pneumococcal Polysaccharide Vaccine (PPSV23): What You Need to Know 1. Why get vaccinated? Pneumococcal polysaccharide vaccine (PPSV23) can prevent pneumococcal disease. Pneumococcal disease refers to any illness caused by pneumococcal bacteria. These bacteria can cause many types of illnesses, including pneumonia, which is an infection of the lungs. Pneumococcal bacteria are one of the most common causes of pneumonia. Besides pneumonia, pneumococcal bacteria can also cause:  Ear infections  Sinus infections  Meningitis (infection of the tissue covering the brain and spinal cord)  Bacteremia (bloodstream infection) Anyone can get pneumococcal disease, but children under 2 years of age, people with certain medical conditions, adults 65 years or older, and cigarette smokers are at the highest risk. Most pneumococcal infections are mild. However, some can result in long-term problems, such as brain damage or hearing loss. Meningitis, bacteremia, and pneumonia caused by pneumococcal disease can be fatal. 2. PPSV23 PPSV23 protects against 23 types of bacteria that cause pneumococcal disease. PPSV23 is recommended for:  All adults 65 years or older,  Anyone 2 years or older with certain medical conditions that can lead to an increased risk for pneumococcal disease. Most people need only one dose of PPSV23. A second dose of PPSV23, and another type of pneumococcal vaccine called PCV13, are recommended for certain high-risk groups. Your health care provider can give you more information. People 65 years or older should get a dose of PPSV23 even if they have already gotten one or more doses of the vaccine before they turned 65. 3. Talk with your health care provider Tell your vaccine provider if the person getting the vaccine:  Has had an allergic reaction after a previous dose of PPSV23, or has any severe, life-threatening allergies. In some cases, your health care provider may decide to postpone  PPSV23 vaccination to a future visit. People with minor illnesses, such as a cold, may be vaccinated. People who are moderately or severely ill should usually wait until they recover before getting PPSV23. Your health care provider can give you more information. 4. Risks of a vaccine reaction  Redness or pain where the shot is given, feeling tired, fever, or muscle aches can happen after PPSV23. People sometimes faint after medical procedures, including vaccination. Tell your provider if you feel dizzy or have vision changes or ringing in the ears. As with any medicine, there is a very remote chance of a vaccine causing a severe allergic reaction, other serious injury, or death. 5. What if there is a serious problem? An allergic reaction could occur after the vaccinated person leaves the clinic. If you see signs of a severe allergic reaction (hives, swelling of the face and throat, difficulty breathing, a fast heartbeat, dizziness, or weakness), call 9-1-1 and get the person to the nearest hospital. For other signs that concern you, call your health care provider. Adverse reactions should be reported to the Vaccine Adverse Event Reporting System (VAERS). Your health care provider will usually file this report, or you can do it yourself. Visit the VAERS website at www.vaers.hhs.gov or call 1-800-822-7967. VAERS is only for reporting reactions, and VAERS staff do not give medical advice. 6. How can I learn more?  Ask your health care provider.  Call your local or state health department.  Contact the Centers for Disease Control and Prevention (CDC): ? Call 1-800-232-4636 (1-800-CDC-INFO) or ? Visit CDC's website at www.cdc.gov/vaccines CDC Vaccine Information Statement PPSV23 Vaccine (06/01/2018) This information is not intended to replace advice given to you by your health care provider. Make sure you   discuss any questions you have with your health care provider. Document Revised: 11/08/2018  Document Reviewed: 03/01/2018 Elsevier Patient Education  2020 Elsevier Inc.  

## 2020-08-20 ENCOUNTER — Other Ambulatory Visit: Payer: Self-pay | Admitting: Pulmonary Disease

## 2020-08-26 ENCOUNTER — Ambulatory Visit (INDEPENDENT_AMBULATORY_CARE_PROVIDER_SITE_OTHER): Payer: Self-pay | Admitting: Pulmonary Disease

## 2020-08-26 ENCOUNTER — Encounter: Payer: Self-pay | Admitting: Pulmonary Disease

## 2020-08-26 ENCOUNTER — Other Ambulatory Visit: Payer: Self-pay

## 2020-08-26 VITALS — BP 120/84 | HR 95 | Temp 98.5°F | Ht 64.0 in | Wt 335.5 lb

## 2020-08-26 DIAGNOSIS — M810 Age-related osteoporosis without current pathological fracture: Secondary | ICD-10-CM

## 2020-08-26 DIAGNOSIS — J4551 Severe persistent asthma with (acute) exacerbation: Secondary | ICD-10-CM

## 2020-08-26 DIAGNOSIS — Z7952 Long term (current) use of systemic steroids: Secondary | ICD-10-CM

## 2020-08-26 MED ORDER — IPRATROPIUM-ALBUTEROL 0.5-2.5 (3) MG/3ML IN SOLN
3.0000 mL | RESPIRATORY_TRACT | 6 refills | Status: DC | PRN
Start: 2020-08-26 — End: 2020-11-04

## 2020-08-26 MED ORDER — SPIRIVA RESPIMAT 2.5 MCG/ACT IN AERS: 2.0000 | INHALATION_SPRAY | Freq: Every day | RESPIRATORY_TRACT | 0 refills | Status: DC

## 2020-08-26 MED ORDER — PREDNISONE 10 MG PO TABS: ORAL_TABLET | ORAL | 0 refills | Status: DC

## 2020-08-26 NOTE — Telephone Encounter (Signed)
Called [employer] to check status, rep tracked package and stated that Epipen was actually delivered to the office address on 08/12/20. She said patient's name and DOB should be on shipping label.

## 2020-08-26 NOTE — Telephone Encounter (Signed)
Patient Epipen was not received by me.  No Epipen or package in injection with Patient's name.  Unsure who received package and where it may be?

## 2020-08-26 NOTE — Telephone Encounter (Signed)
Can you please call [employer] to see if can provide actual tracking number? So we can see who signed for package or if it delivered to [address].  And when we can get a refill sent out due to unable to locate package.  Thanks!
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
## 2020-08-26 NOTE — Telephone Encounter (Signed)
Patient seen in office today by Dr. Loanne Drilling.  I spoke with Patient after OV to schedule new start Xolair injection.  Patient stated Epipen has not been received at this time. Per previous note Epipen was to be received in 5-10 business days, which should have put Epipen being delivered around 1/12-1/20.  I did tell Patient weather could have caused delivery delay. Patient stated she would call office if she receives Epipen this week.  Message routed to Pharmacy Team

## 2020-08-26 NOTE — Telephone Encounter (Signed)
Spoke with [facility]:  tracking # [id number] Signature: Only states front dest Attn:  was noted to Lisa/Rachael Next refill: 09/03/20 Delivered: 08/12/20 @ 11:12 Inquired/asked about emergency refill request: denied/not able to fill any emergency request. Address:  verified

## 2020-08-27 ENCOUNTER — Telehealth: Payer: Self-pay | Admitting: Pharmacy Technician

## 2020-08-28 NOTE — Telephone Encounter (Signed)
Epipen received at [facility].

## 2020-08-28 NOTE — Telephone Encounter (Signed)
Called and spoke with Patient.  Patient scheduled 09/19/20 at 0930 to start Xolair injections.  Patient is aware of office policy and Epipen was delivered to office. If new start availability comes open I will reach out to Patient to start Xolair.

## 2020-08-31 ENCOUNTER — Encounter: Payer: Self-pay | Admitting: Pulmonary Disease

## 2020-09-13 ENCOUNTER — Emergency Department (HOSPITAL_COMMUNITY): Payer: Self-pay

## 2020-09-13 ENCOUNTER — Other Ambulatory Visit: Payer: Self-pay

## 2020-09-13 ENCOUNTER — Emergency Department (HOSPITAL_COMMUNITY)
Admission: EM | Admit: 2020-09-13 | Discharge: 2020-09-13 | Disposition: A | Discharge status: Home / Self Care | Payer: Self-pay | Attending: Emergency Medicine | Admitting: Emergency Medicine

## 2020-09-13 ENCOUNTER — Encounter (HOSPITAL_COMMUNITY): Payer: Self-pay | Admitting: Emergency Medicine

## 2020-09-13 DIAGNOSIS — S8011XA Contusion of right lower leg, initial encounter: Secondary | ICD-10-CM | POA: Insufficient documentation

## 2020-09-13 DIAGNOSIS — J449 Chronic obstructive pulmonary disease, unspecified: Secondary | ICD-10-CM | POA: Insufficient documentation

## 2020-09-13 DIAGNOSIS — S8991XA Unspecified injury of right lower leg, initial encounter: Secondary | ICD-10-CM

## 2020-09-13 DIAGNOSIS — Z79899 Other long term (current) drug therapy: Secondary | ICD-10-CM | POA: Insufficient documentation

## 2020-09-13 DIAGNOSIS — W19XXXA Unspecified fall, initial encounter: Secondary | ICD-10-CM

## 2020-09-13 DIAGNOSIS — S82831A Other fracture of upper and lower end of right fibula, initial encounter for closed fracture: Secondary | ICD-10-CM

## 2020-09-13 DIAGNOSIS — W009XXA Unspecified fall due to ice and snow, initial encounter: Secondary | ICD-10-CM | POA: Insufficient documentation

## 2020-09-13 DIAGNOSIS — Z87891 Personal history of nicotine dependence: Secondary | ICD-10-CM | POA: Insufficient documentation

## 2020-09-13 HISTORY — DX: Age-related osteoporosis without current pathological fracture: M81.0

## 2020-09-13 MED ORDER — HYDROCODONE-ACETAMINOPHEN 5-325 MG PO TABS: 1.0000 | ORAL_TABLET | Freq: Four times a day (QID) | ORAL | 0 refills | Status: DC | PRN

## 2020-09-13 NOTE — ED Notes (Signed)
Provider in room with pt.

## 2020-09-13 NOTE — Discharge Instructions (Signed)
Wear the knee immobilizer all times she can take it off to shower.  Make an appointment to follow-up with orthopedics.  Take the hydrocodone as directed as and as needed for pain.  X-ray did show proximal fibula fracture.  Still possibility for internal or external nonbony knee injury.

## 2020-09-13 NOTE — ED Provider Notes (Addendum)
[facility] EMERGENCY DEPARTMENT Provider Note   CSN: [id number] Arrival date & time: 09/13/20  4098     History Chief Complaint  Patient presents with  . Fall    Linda Ellis is a 45 y.o. female.  Patient slipped on the ice on Monday morning that would be via February 7.  States she did not hit her right leg with anything but she kind of felt like a pop sensation and then has developed all the swelling of the knee and bruising predominantly down the lateral aspect of the right leg.  Denies any other injuries.  No history of prior problems with the leg.  No loss of consciousness no neck or back injury.        Past Medical History:  Diagnosis Date  . Asthma 2008  . COPD (chronic obstructive pulmonary disease) ([place])   . GERD (gastroesophageal reflux disease)   . Multiple gastric ulcers   . Osteoporosis   . Pneumonia     Patient Active Problem List   Diagnosis Date Noted  . Vitamin D deficiency 07/08/2020  . Prediabetes 06/25/2020  . Localized osteoporosis without current pathological fracture 06/24/2020  . Allergic rhinitis 12/07/2019  . Morbid obesity ([place]) 10/09/2019  . Former smoker 09/25/2019  . Medication management 09/25/2019  . Severe persistent asthma dependent on systemic steroids with acute exacerbation 07/30/2018  . Gastroesophageal reflux disease     Past Surgical History:  Procedure Laterality Date  . CHOLECYSTECTOMY    . DIAGNOSTIC LAPAROSCOPY WITH REMOVAL OF ECTOPIC PREGNANCY Right 01/24/2017   Procedure: REMOVAL OF ECTOPIC PREGNANCY;  Surgeon: Florian Buff, MD;  Location: [facility];  Service: Gynecology;  Laterality: Right;  . LAPAROSCOPIC UNILATERAL SALPINGECTOMY Right 01/24/2017   Procedure: LAPAROSCOPIC UNILATERAL SALPINGECTOMY;  Surgeon: Florian Buff, MD;  Location: [facility];  Service: Gynecology;  Laterality: Right;     OB History    Gravida  5   Para  4   Term  4   Preterm      AB      Living  4     SAB      IAB      Ectopic       Multiple      Live Births              Family History  Problem Relation Age of Onset  . Asthma Mother   . COPD Mother   . Hypertension Father   . Hyperlipidemia Father   . Asthma Father   . COPD Father   . Diabetes Maternal Grandfather   . Heart attack Maternal Grandfather   . Stroke Maternal Grandfather   . Hypertension Maternal Grandfather     Social History   Tobacco Use  . Smoking status: Former Smoker    Packs/day: 1.00    Years: 31.00    Pack years: 31.00    Types: Cigarettes    Start date: 1989    Quit date: 02/24/2019    Years since quitting: 1.5  . Smokeless tobacco: Never Used  Vaping Use  . Vaping Use: Never used  Substance Use Topics  . Alcohol use: Yes    Comment: occasionally  . Drug use: No    Home Medications Prior to Admission medications   Medication Sig Start Date End Date Taking? Authorizing Provider  alendronate (FOSAMAX) 70 MG tablet Take 1 tablet (70 mg total) by mouth once a week. Take with a full glass of water on an empty  stomach. 07/08/20   Cassandria Anger, MD  budesonide-formoterol (SYMBICORT) 160-4.5 MCG/ACT inhaler Inhale 2 puffs into the lungs 3 (three) times daily. 02/16/20   Lauraine Rinne, NP  Calcium Carb-Cholecalciferol 500-600 MG-UNIT TABS Take 1 tablet by mouth 2 (two) times daily with a meal. 07/08/20   Nida, Marella Chimes, MD  chlorpheniramine-HYDROcodone (TUSSIONEX PENNKINETIC ER) 10-8 MG/5ML SUER Take 5 mLs by mouth at bedtime as needed for cough. 06/24/20   Margaretha Seeds, MD  Famotidine (PEPCID PO) Take 1 tablet by mouth daily.     [provider]  fluticasone (FLONASE) 50 MCG/ACT nasal spray USE 1 SPRAY IN EACH NOSTRIL TWICE DAILY 05/14/20   Lauraine Rinne, NP  ipratropium-albuterol (DUONEB) 0.5-2.5 (3) MG/3ML SOLN Inhale 3 mLs into the lungs every 4 (four) hours as needed. 08/26/20   Margaretha Seeds, MD  loratadine (CLARITIN) 10 MG tablet TAKE 1 Tablet BY MOUTH ONCE EVERY DAY AS NEEDED 06/04/20    [name], PA-C  montelukast (SINGULAIR) 10 MG tablet TAKE 1 Tablet BY MOUTH ONCE EVERY NIGHT AT BEDTIME 06/04/20   [name], PA-C  omalizumab Arvid Right) 150 MG/ML prefilled syringe Inject 300 mg into the skin every 14 (fourteen) days. 06/13/20   Margaretha Seeds, MD  omalizumab Arvid Right) 75 MG/0.5ML prefilled syringe Inject 75 mg into the skin every 14 (fourteen) days. 06/13/20   Margaretha Seeds, MD  omeprazole (PRILOSEC) 40 MG capsule TAKE 1 Capsule BY MOUTH ONCE EVERY DAY 04/18/20   [name], PA-C  pantoprazole (PROTONIX) 40 MG tablet TAKE 1 TABLET BY MOUTH TWICE DAILY BEFORE A MEAL 07/01/20   [name], PA-C  predniSONE (DELTASONE) 10 MG tablet Take 6 tablets x three days (60 mg), followed by 5 tablets x three days (50mg ), then 4 tablets x three day(40mg ), then 3 tablets (30mg ) x three days, then 2 tablets (20mg ) x three days, then 1 tablet (10mg ) x three days, then STOP 08/26/20   Margaretha Seeds, MD  promethazine (PHENERGAN) 25 MG tablet Take 1 tablet (25 mg total) by mouth every 8 (eight) hours as needed for nausea or vomiting. 03/11/20   [name], PA-C  PROVENTIL HFA 108 (90 Base) MCG/ACT inhaler INHALE 1 TO 2 PUFFS BY MOUTH EVERY 6 HOURS AS NEEDED FOR COUGHING, WHEEZING, OR SHORTNESS OF BREATH 02/15/20   [name], PA-C  Tiotropium Bromide Monohydrate (SPIRIVA RESPIMAT) 1.25 MCG/ACT AERS Inhale 2 puffs into the lungs daily. 06/24/20   Margaretha Seeds, MD  Tiotropium Bromide Monohydrate (SPIRIVA RESPIMAT) 2.5 MCG/ACT AERS Inhale 2 puffs into the lungs daily. 06/24/20   Margaretha Seeds, MD  Tiotropium Bromide Monohydrate (SPIRIVA RESPIMAT) 2.5 MCG/ACT AERS Inhale 2 puffs into the lungs daily. 08/26/20   Margaretha Seeds, MD  UNKNOWN TO PATIENT OTC Gas medication    [provider]  Vitamin D, Ergocalciferol, (DRISDOL) 1.25 MG (50000 UNIT) CAPS capsule Take 1 capsule (50,000 Units total) by mouth every 7 (seven) days. 07/08/20   Cassandria Anger, MD  ALBUTEROL IN Inhale 2 puffs into the lungs as needed. For shortness of breath   10/24/11  [provider]    Allergies    Patient has no known allergies.  Review of Systems   Review of Systems  Constitutional: Negative for chills and fever.  HENT: Negative for congestion, rhinorrhea and sore throat.   Eyes: Negative for visual disturbance.  Respiratory: Negative for cough and shortness of breath.   Cardiovascular: Negative for chest pain and leg  swelling.  Gastrointestinal: Negative for abdominal pain, diarrhea, nausea and vomiting.  Genitourinary: Negative for dysuria.  Musculoskeletal: Positive for joint swelling. Negative for back pain and neck pain.  Skin: Negative for rash.  Neurological: Negative for dizziness, light-headedness and headaches.  Hematological: Does not bruise/bleed easily.  Psychiatric/Behavioral: Negative for confusion.    Physical Exam Updated Vital Signs BP (!) 143/68 (BP Location: Left Wrist)   Pulse 86   Temp 98.4 F (36.9 C) (Oral)   Resp 17   Ht 1.626 m (5\' 4" )   Wt (!) 152.2 kg   LMP 09/06/2020   SpO2 98%   BMI 57.60 kg/m   Physical Exam Vitals and nursing note reviewed.  Constitutional:      General: She is not in acute distress.    Appearance: Normal appearance. She is well-developed and well-nourished.  HENT:     Head: Normocephalic and atraumatic.  Eyes:     Extraocular Movements: Extraocular movements intact.     Conjunctiva/sclera: Conjunctivae normal.     Pupils: Pupils are equal, round, and reactive to light.  Cardiovascular:     Rate and Rhythm: Normal rate and regular rhythm.     Heart sounds: No murmur heard.   Pulmonary:     Effort: Pulmonary effort is normal. No respiratory distress.     Breath sounds: Normal breath sounds.  Abdominal:     Palpations: Abdomen is soft.     Tenderness: There is no abdominal tenderness.  Musculoskeletal:        General: Swelling, tenderness and signs of  injury present. No edema. Normal range of motion.     Cervical back: Normal range of motion and neck supple.     Comments: Swelling around the right knee.  Tello difficult to palpate.  There is bruising to the lateral aspect of the knee and then bruising down the lateral aspect of the leg.  No erythema.  Dorsalis pedis pulse is 2+.  No tenderness to the ankle or foot.  No hip tenderness.  Good range of motion at the hip and at the ankle and foot.  Limited range of motion of the knee.  Station intact to the right foot.  Skin:    General: Skin is warm and dry.     Capillary Refill: Capillary refill takes less than 2 seconds.  Neurological:     General: No focal deficit present.     Mental Status: She is alert and oriented to person, place, and time.     Cranial Nerves: No cranial nerve deficit.     Sensory: No sensory deficit.     Motor: No weakness.  Psychiatric:        Mood and Affect: Mood and affect normal.     ED Results / Procedures / Treatments   Labs (all labs ordered are listed, but only abnormal results are displayed) Labs Reviewed - No data to display  EKG None  Radiology No results found.  Procedures Procedures   Medications Ordered in ED Medications - No data to display  ED Course  I have reviewed the triage vital signs and the nursing notes.  Pertinent labs & imaging results that were available during my care of the patient were reviewed by me and considered in my medical decision making (see chart for details).    MDM Rules/Calculators/A&P                          Possible internal injury to  the right knee.  Also possible fibula injury.  Based on exam and history high probability for some sort of nonbony injury to the right knee either internally or externally.  Will probably require knee immobilizer and follow-up with orthopedics.  Patient prefers to follow-up locally.  Tray consistent with proximal fibula fracture.  With some displacement.  Patient will be  treated with knee immobilizer follow-up with orthopedics patient has a history of gastric ulcers.  So we will go ahead and treat with a short course of hydrocodone for the pain.  Final Clinical Impression(s) / ED Diagnoses Final diagnoses:  Fall, initial encounter  Injury of right lower extremity, initial encounter    Rx / DC Orders ED Discharge Orders    None       Fredia Sorrow, MD 09/13/20 [id number]    Fredia Sorrow, MD 09/13/20 [phone]

## 2020-09-13 NOTE — ED Notes (Signed)
X ray in room.

## 2020-09-13 NOTE — ED Notes (Addendum)
Knee immobilizer applied and Pt educated on use and application, dc medications and need to call orthro to make appt. Pt was wheel to car in NAD.

## 2020-09-13 NOTE — ED Triage Notes (Signed)
Pt reports she slipped on ice Monday morning 09/09/2020. Pt reports right leg pain. Pt has bruising and swelling to the RLE, pulses palpable.

## 2020-09-15 ENCOUNTER — Other Ambulatory Visit: Payer: Self-pay | Admitting: Physician Assistant

## 2020-09-16 ENCOUNTER — Ambulatory Visit: Payer: Medicaid Other | Admitting: Orthopedic Surgery

## 2020-09-19 ENCOUNTER — Other Ambulatory Visit: Payer: Self-pay

## 2020-09-19 ENCOUNTER — Telehealth: Payer: Self-pay | Admitting: Pulmonary Disease

## 2020-09-19 ENCOUNTER — Ambulatory Visit: Payer: Medicaid Other | Admitting: Orthopedic Surgery

## 2020-09-19 ENCOUNTER — Ambulatory Visit (INDEPENDENT_AMBULATORY_CARE_PROVIDER_SITE_OTHER): Payer: Self-pay

## 2020-09-19 DIAGNOSIS — Z7952 Long term (current) use of systemic steroids: Secondary | ICD-10-CM

## 2020-09-19 DIAGNOSIS — J4551 Severe persistent asthma with (acute) exacerbation: Secondary | ICD-10-CM

## 2020-09-19 MED ORDER — OMALIZUMAB 75 MG/0.5ML ~~LOC~~ SOSY
75.0000 mg | PREFILLED_SYRINGE | Freq: Once | SUBCUTANEOUS | Status: AC
  Administered 2020-09-19: 75 mg via SUBCUTANEOUS

## 2020-09-19 MED ORDER — OMALIZUMAB 150 MG/ML ~~LOC~~ SOSY
300.0000 mg | PREFILLED_SYRINGE | Freq: Once | SUBCUTANEOUS | Status: AC
  Administered 2020-09-19: 300 mg via SUBCUTANEOUS

## 2020-09-19 NOTE — Progress Notes (Signed)
Patient presented to the office today for first-time Xolair injection.  Primary Pulmonologist:  Dr. Margaretha Seeds Medication name: Xolair Strength: 375mg  Site(s): right arm  Epi pen/Auvi-Q visible during appointment: Yes  Time of injection: 0930  Patient evaluated every 15-20 minutes per protocol x2 hours.  1st check: 0945 Evaluation: Patient sitting quietly.  Patient denies any side or adverse effects. No redness or swelling at injection sites.  2nd check: 1000  Evaluation: Patient sitting quietly.  Patient denies any problems. No redness or swelling at injection sites.  3rd check: 1015  Evaluation: Patient sitting quietly.  Patient denies any problems.  4th check: 1030   Evaluation: Patient sitting quietly.  Patient denies any problems.  5th check: 1045  Evaluation: Patient sitting quietly.  Patient denies any problems.  6th check: 1100  Evaluation: Patient denies any problems.  7th check: 1115  Evaluation: Patient denies any problems.  8th check: 1130  Evaluation:Patient denies any side or adverse effects.  No redness or swelling at injection sites.  Patient instructed on possible side effects and what to do in emergency situation. Patient shown and demonstrated how to use Epipen with demo pen.

## 2020-09-19 NOTE — Telephone Encounter (Signed)
I am ok with having a note written that patient is "unable to work at this time due to her active pulmonary issues." If she needs a more specific diagnosis, we can use "uncontrolled severe persistent asthma."   When does she need the note signed? I will be working in the hospital until 2/28. Please let me know if she needs the documentation sooner.  Rodman Pickle, M.D. [facility] 09/19/2020 10:56 AM

## 2020-09-19 NOTE — Telephone Encounter (Signed)
Patient came into office today for first Xolair injection.  Patient requested a note from Dr. Maralyn Sago that she can not work at this time, or she can not work over 21 hours per week.  Patient stated this is so she can continue receiving food stamps.  Patient stated her assistance expires the end of this month. Patient stated she has a upcoming hearing 11/25/20 for disability/SSI. Patient has currently fractured her right lower leg and scheduled with Orthopedics this afternoon. Advised Patient to receive a note from Ortho, but Patient stated she needed a note from pulmonary too.  Message routed to Dr. Loanne Drilling

## 2020-09-19 NOTE — Telephone Encounter (Signed)
Yes please. Thank you

## 2020-09-19 NOTE — Telephone Encounter (Signed)
Spoke with patient. She appreciates Dr. Loanne Drilling willing to provide her with a letter. She did state that if she could receive the letter sooner than the 28th, it would be better for her.   Dr. Loanne Drilling, I can type the letter in [place] and use your signature stamp if you are ok with this.

## 2020-09-19 NOTE — Telephone Encounter (Signed)
Spoke with patient. She stated that she was still outside the office in her car. I advised her that I would go ahead and type the letter and leave it at the front desk since she lives in [place]. She verbalized understanding.

## 2020-09-23 ENCOUNTER — Encounter: Payer: Self-pay | Admitting: Physician Assistant

## 2020-09-23 ENCOUNTER — Ambulatory Visit: Payer: Medicaid Other | Admitting: Physician Assistant

## 2020-09-23 DIAGNOSIS — S82831D Other fracture of upper and lower end of right fibula, subsequent encounter for closed fracture with routine healing: Secondary | ICD-10-CM

## 2020-09-23 DIAGNOSIS — J45909 Unspecified asthma, uncomplicated: Secondary | ICD-10-CM

## 2020-09-23 DIAGNOSIS — M810 Age-related osteoporosis without current pathological fracture: Secondary | ICD-10-CM

## 2020-09-23 DIAGNOSIS — K219 Gastro-esophageal reflux disease without esophagitis: Secondary | ICD-10-CM

## 2020-09-23 NOTE — Progress Notes (Signed)
LMP 09/06/2020    Subjective:    Patient ID: Linda Ellis, female    DOB: Jul 18, 1976, 45 y.o.   MRN: [id number]  HPI: Linda Ellis is a 45 y.o. female presenting on 09/23/2020 for No chief complaint on file.   HPI   This is a telemedicine appointment through [facility] due to coronavirus pandemic.  I connected with  Linda Ellis on 09/23/20 by a video enabled telemedicine application and verified that I am speaking with the correct person using two identifiers.   I discussed the limitations of evaluation and management by telemedicine. The patient expressed understanding and agreed to proceed.  Pt is at home.  Provider is at office.    Pt is 37yoF with asthma that is managed by pulmonology.    She recently fell and broke her fibula.   She says she is trying to get appointment with orthopedist but is needing to complete/update financial assistance paperwork.   She has osteoporosis felt to be related to frequent steroid use.   She is currently using omeprazole, pepcid and protonix for her GERD.  She says that her symptoms are controlled if she uses all 3 medications but if she stops one, she has acid so bad that it awakens her from sleep.      Relevant past medical, surgical, family and social history reviewed and updated as indicated. Interim medical history since our last visit reviewed. Allergies and medications reviewed and updated.   Current Outpatient Medications:  .  alendronate (FOSAMAX) 70 MG tablet, Take 1 tablet (70 mg total) by mouth once a week. Take with a full glass of water on an empty stomach., Disp: 12 tablet, Rfl: 3 .  budesonide-formoterol (SYMBICORT) 160-4.5 MCG/ACT inhaler, Inhale 2 puffs into the lungs 3 (three) times daily., Disp: 1 Inhaler, Rfl: 6 .  Calcium Carb-Cholecalciferol 500-600 MG-UNIT TABS, Take 1 tablet by mouth 2 (two) times daily with a meal., Disp: 180 tablet, Rfl: 1 .  Famotidine (PEPCID PO), Take 1 tablet by mouth daily. , Disp: , Rfl:  .   fluticasone (FLONASE) 50 MCG/ACT nasal spray, USE 1 SPRAY IN EACH NOSTRIL TWICE DAILY, Disp: 16 g, Rfl: 3 .  ipratropium-albuterol (DUONEB) 0.5-2.5 (3) MG/3ML SOLN, Inhale 3 mLs into the lungs every 4 (four) hours as needed., Disp: 360 mL, Rfl: 6 .  loratadine (CLARITIN) 10 MG tablet, TAKE 1 Tablet BY MOUTH ONCE EVERY DAY AS NEEDED, Disp: 90 tablet, Rfl: 1 .  montelukast (SINGULAIR) 10 MG tablet, TAKE 1 Tablet BY MOUTH ONCE EVERY NIGHT AT BEDTIME, Disp: 90 tablet, Rfl: 1 .  omalizumab (XOLAIR) 150 MG/ML prefilled syringe, Inject 300 mg into the skin every 14 (fourteen) days., Disp: 4 mL, Rfl: 11 .  omalizumab (XOLAIR) 75 MG/0.5ML prefilled syringe, Inject 75 mg into the skin every 14 (fourteen) days., Disp: 1 mL, Rfl: 11 .  omeprazole (PRILOSEC) 40 MG capsule, TAKE 1 Capsule BY MOUTH ONCE EVERY DAY, Disp: 90 capsule, Rfl: 1 .  pantoprazole (PROTONIX) 40 MG tablet, TAKE 1 TABLET BY MOUTH TWICE DAILY BEFORE A MEAL, Disp: 60 tablet, Rfl: 4 .  PROVENTIL HFA 108 (90 Base) MCG/ACT inhaler, INHALE 1 TO 2 PUFFS BY MOUTH EVERY 6 HOURS AS NEEDED FOR COUGHING, WHEEZING, OR SHORTNESS OF BREATH, Disp: 20.1 g, Rfl: prn .  Tiotropium Bromide Monohydrate (SPIRIVA RESPIMAT) 1.25 MCG/ACT AERS, Inhale 2 puffs into the lungs daily., Disp: 4 g, Rfl: 6 .  Tiotropium Bromide Monohydrate (SPIRIVA RESPIMAT) 2.5 MCG/ACT AERS, Inhale 2  puffs into the lungs daily., Disp: 1 each, Rfl: 0 .  Tiotropium Bromide Monohydrate (SPIRIVA RESPIMAT) 2.5 MCG/ACT AERS, Inhale 2 puffs into the lungs daily., Disp: 2 each, Rfl: 0 .  UNKNOWN TO PATIENT, OTC Gas medication, Disp: , Rfl:  .  Vitamin D, Ergocalciferol, (DRISDOL) 1.25 MG (50000 UNIT) CAPS capsule, Take 1 capsule (50,000 Units total) by mouth every 7 (seven) days., Disp: 12 capsule, Rfl: 0 .  promethazine (PHENERGAN) 25 MG tablet, Take 1 tablet (25 mg total) by mouth every 8 (eight) hours as needed for nausea or vomiting. (Patient not taking: Reported on 09/23/2020), Disp: 20 tablet,  Rfl: 0    Review of Systems  Per HPI unless specifically indicated above     Objective:    LMP 09/06/2020   Wt Readings from Last 3 Encounters:  09/13/20 (!) 335 lb 8.6 oz (152.2 kg)  08/26/20 (!) 335 lb 8 oz (152.2 kg)  06/25/20 (!) 339 lb 3.2 oz (153.9 kg)    Physical Exam Constitutional:      General: She is not in acute distress.    Appearance: She is not toxic-appearing.  HENT:     Head: Normocephalic and atraumatic.  Pulmonary:     Effort: No respiratory distress.  Neurological:     Mental Status: She is alert and oriented to person, place, and time.  Psychiatric:        Attention and Perception: Attention normal.        Speech: Speech normal.        Behavior: Behavior normal. Behavior is cooperative.             Assessment & Plan:   Encounter Diagnoses  Name Primary?  Marland Kitchen Asthma, unspecified asthma severity, unspecified whether complicated, unspecified whether persistent Yes  . Gastroesophageal reflux disease, unspecified whether esophagitis present   . Osteoporosis, unspecified osteoporosis type, unspecified pathological fracture presence   . Morbid obesity ([place])   . Closed fracture of proximal end of right fibula with routine healing, unspecified fracture morphology, subsequent encounter       -will Mail cafa / application for [employer] financial assistance.  Will Ask CC to help her with that application -Refer to GI due to needs 3 meds to control her GERD Pt to reschedule with orthopedics for fibula fracture -pt to continue with pulmonology for her asthma -pt to follow up here 4 months.

## 2020-10-03 ENCOUNTER — Encounter: Payer: Self-pay | Admitting: Orthopedic Surgery

## 2020-10-03 ENCOUNTER — Other Ambulatory Visit: Payer: Self-pay

## 2020-10-03 ENCOUNTER — Ambulatory Visit (INDEPENDENT_AMBULATORY_CARE_PROVIDER_SITE_OTHER): Payer: Self-pay

## 2020-10-03 ENCOUNTER — Ambulatory Visit: Payer: Medicaid Other

## 2020-10-03 ENCOUNTER — Ambulatory Visit (INDEPENDENT_AMBULATORY_CARE_PROVIDER_SITE_OTHER): Payer: Self-pay | Admitting: Orthopedic Surgery

## 2020-10-03 VITALS — BP 158/91 | HR 103 | Ht 64.0 in | Wt 335.0 lb

## 2020-10-03 DIAGNOSIS — Z7952 Long term (current) use of systemic steroids: Secondary | ICD-10-CM

## 2020-10-03 DIAGNOSIS — J4551 Severe persistent asthma with (acute) exacerbation: Secondary | ICD-10-CM

## 2020-10-03 DIAGNOSIS — W009XXA Unspecified fall due to ice and snow, initial encounter: Secondary | ICD-10-CM

## 2020-10-03 DIAGNOSIS — M79661 Pain in right lower leg: Secondary | ICD-10-CM

## 2020-10-03 DIAGNOSIS — Z6841 Body Mass Index (BMI) 40.0 and over, adult: Secondary | ICD-10-CM

## 2020-10-03 DIAGNOSIS — S83421A Sprain of lateral collateral ligament of right knee, initial encounter: Secondary | ICD-10-CM

## 2020-10-03 DIAGNOSIS — R600 Localized edema: Secondary | ICD-10-CM

## 2020-10-03 MED ORDER — TRAMADOL HCL 50 MG PO TABS: 50.0000 mg | ORAL_TABLET | Freq: Four times a day (QID) | ORAL | 0 refills | Status: AC | PRN

## 2020-10-03 MED ORDER — OMALIZUMAB 150 MG/ML ~~LOC~~ SOSY
300.0000 mg | PREFILLED_SYRINGE | Freq: Once | SUBCUTANEOUS | Status: AC
Start: 2020-10-03 — End: 2020-10-03
  Administered 2020-10-03: 300 mg via SUBCUTANEOUS

## 2020-10-03 MED ORDER — OMALIZUMAB 75 MG/0.5ML ~~LOC~~ SOSY
75.0000 mg | PREFILLED_SYRINGE | Freq: Once | SUBCUTANEOUS | Status: AC
Start: 2020-10-03 — End: 2020-10-03
  Administered 2020-10-03: 75 mg via SUBCUTANEOUS

## 2020-10-03 NOTE — Progress Notes (Signed)
Linda Ellis  10/03/2020  Body mass index is 57.5 kg/m.  ASSESSMENT AND PLAN:     45 year old female had a twisting injury to the right knee with fibular head fracture which is difficult to do without collateral ligament or cruciate ligament damage  Recommend MRI to evaluate that she is also complaining of swelling of the right leg with edema recommend ultrasound to evaluate that  Follow-up after MRI and if the ultrasound is positive we will start her on a blood thinner  Meds ordered this encounter  Medications  . traMADol (ULTRAM) 50 MG tablet    Sig: Take 1 tablet (50 mg total) by mouth every 6 (six) hours as needed for up to 5 days.    Dispense:  20 tablet    Refill:  0     Encounter Diagnoses  Name Primary?  . Pain in right lower leg   . Body mass index 50.0-59.9, adult ([place])   . Morbid obesity ([place])   . Tear of lateral collateral ligament of right knee, initial encounter Yes  . Edema of right lower leg     Imaging External images isolated fibular head fracture is transverse  Internal images similar findings fibular head fracture  HISTORY SECTION :  Chief Complaint  Patient presents with  . Knee Pain    Patient reports that she had a fall on 09/09/20. She fell on ice and is still having some pain,    HPI The patient presents for evaluation of right fibular head fracture  Patient slipped on ice.  She says she felt something rip in the joint  She complains of lateral pain knee swelling decreased range of motion   Review of Systems  Constitutional: Negative for chills and fever.  Respiratory: Negative for shortness of breath.   Cardiovascular: Negative for chest pain.  Musculoskeletal: Positive for joint pain.  Neurological: Negative for tingling.  All other systems reviewed and are negative.    has a past medical history of Asthma (2008), COPD (chronic obstructive pulmonary disease) ([place]), GERD (gastroesophageal reflux disease), Multiple gastric ulcers,  Osteoporosis, and Pneumonia.   Past Surgical History:  Procedure Laterality Date  . CHOLECYSTECTOMY    . DIAGNOSTIC LAPAROSCOPY WITH REMOVAL OF ECTOPIC PREGNANCY Right 01/24/2017   Procedure: REMOVAL OF ECTOPIC PREGNANCY;  Surgeon: Florian Buff, MD;  Location: [facility];  Service: Gynecology;  Laterality: Right;  . LAPAROSCOPIC UNILATERAL SALPINGECTOMY Right 01/24/2017   Procedure: LAPAROSCOPIC UNILATERAL SALPINGECTOMY;  Surgeon: Florian Buff, MD;  Location: [facility];  Service: Gynecology;  Laterality: Right;    Social History   Tobacco Use  . Smoking status: Former Smoker    Packs/day: 1.00    Years: 31.00    Pack years: 31.00    Types: Cigarettes    Start date: 1989    Quit date: 02/24/2019    Years since quitting: 1.6  . Smokeless tobacco: Never Used  Vaping Use  . Vaping Use: Never used  Substance Use Topics  . Alcohol use: Yes    Comment: occasionally  . Drug use: No    Family History  Problem Relation Age of Onset  . Asthma Mother   . COPD Mother   . Hypertension Father   . Hyperlipidemia Father   . Asthma Father   . COPD Father   . Diabetes Maternal Grandfather   . Heart attack Maternal Grandfather   . Stroke Maternal Grandfather   . Hypertension Maternal Grandfather       No Known  Allergies   Current Outpatient Medications:  .  alendronate (FOSAMAX) 70 MG tablet, Take 1 tablet (70 mg total) by mouth once a week. Take with a full glass of water on an empty stomach., Disp: 12 tablet, Rfl: 3 .  budesonide-formoterol (SYMBICORT) 160-4.5 MCG/ACT inhaler, Inhale 2 puffs into the lungs 3 (three) times daily., Disp: 1 Inhaler, Rfl: 6 .  Calcium Carb-Cholecalciferol 500-600 MG-UNIT TABS, Take 1 tablet by mouth 2 (two) times daily with a meal., Disp: 180 tablet, Rfl: 1 .  Famotidine (PEPCID PO), Take 1 tablet by mouth daily. , Disp: , Rfl:  .  fluticasone (FLONASE) 50 MCG/ACT nasal spray, USE 1 SPRAY IN EACH NOSTRIL TWICE DAILY, Disp: 16 g, Rfl: 3 .   ipratropium-albuterol (DUONEB) 0.5-2.5 (3) MG/3ML SOLN, Inhale 3 mLs into the lungs every 4 (four) hours as needed., Disp: 360 mL, Rfl: 6 .  loratadine (CLARITIN) 10 MG tablet, TAKE 1 Tablet BY MOUTH ONCE EVERY DAY AS NEEDED, Disp: 90 tablet, Rfl: 1 .  montelukast (SINGULAIR) 10 MG tablet, TAKE 1 Tablet BY MOUTH ONCE EVERY NIGHT AT BEDTIME, Disp: 90 tablet, Rfl: 1 .  omalizumab (XOLAIR) 150 MG/ML prefilled syringe, Inject 300 mg into the skin every 14 (fourteen) days., Disp: 4 mL, Rfl: 11 .  omalizumab (XOLAIR) 75 MG/0.5ML prefilled syringe, Inject 75 mg into the skin every 14 (fourteen) days., Disp: 1 mL, Rfl: 11 .  omeprazole (PRILOSEC) 40 MG capsule, TAKE 1 Capsule BY MOUTH ONCE EVERY DAY, Disp: 90 capsule, Rfl: 1 .  pantoprazole (PROTONIX) 40 MG tablet, TAKE 1 TABLET BY MOUTH TWICE DAILY BEFORE A MEAL, Disp: 60 tablet, Rfl: 4 .  promethazine (PHENERGAN) 25 MG tablet, Take 1 tablet (25 mg total) by mouth every 8 (eight) hours as needed for nausea or vomiting., Disp: 20 tablet, Rfl: 0 .  PROVENTIL HFA 108 (90 Base) MCG/ACT inhaler, INHALE 1 TO 2 PUFFS BY MOUTH EVERY 6 HOURS AS NEEDED FOR COUGHING, WHEEZING, OR SHORTNESS OF BREATH, Disp: 20.1 g, Rfl: prn .  Tiotropium Bromide Monohydrate (SPIRIVA RESPIMAT) 1.25 MCG/ACT AERS, Inhale 2 puffs into the lungs daily., Disp: 4 g, Rfl: 6 .  Tiotropium Bromide Monohydrate (SPIRIVA RESPIMAT) 2.5 MCG/ACT AERS, Inhale 2 puffs into the lungs daily., Disp: 1 each, Rfl: 0 .  Tiotropium Bromide Monohydrate (SPIRIVA RESPIMAT) 2.5 MCG/ACT AERS, Inhale 2 puffs into the lungs daily., Disp: 2 each, Rfl: 0 .  traMADol (ULTRAM) 50 MG tablet, Take 1 tablet (50 mg total) by mouth every 6 (six) hours as needed for up to 5 days., Disp: 20 tablet, Rfl: 0 .  UNKNOWN TO PATIENT, OTC Gas medication, Disp: , Rfl:    PHYSICAL EXAM SECTION: BP (!) 158/91   Pulse (!) 103   Ht 5\' 4"  (1.626 m)   Wt (!) 335 lb (152 kg)   LMP 09/06/2020   BMI 57.50 kg/m   Body mass index is 57.5  kg/m.  The patient meets the AMA guidelines for Morbid (severe) obesity with a BMI > 40.0 and I have recommended weight loss.   General appearance: Well-developed well-nourished no gross deformities  Lymph nodes: No lymphadenopathy  Neck is supple without palpable mass, full range of motion  Cardiovascular normal pulse and perfusion in all 4 extremities normal color without edema  Neurologically deep tendon reflexes are equal and normal, no sensation loss or deficits no pathologic reflexes  Psychological: Awake alert and oriented x3 mood and affect normal  Skin no lacerations or ulcerations no nodularity no palpable masses,  no erythema or nodularity  Musculoskeletal:   Right knee  Lateral tenderness Lateral knee seems stable  AP tests seem normal  Joint effusion    5:10 PM

## 2020-10-03 NOTE — Progress Notes (Signed)
Have you been hospitalized within the last 10 days?  No Do you have a fever?  No Do you have a cough?  No Do you have a headache or sore throat? No Do you have your Epi Pen visible and is it within date?  Yes 

## 2020-10-03 NOTE — Patient Instructions (Addendum)
You may go ahead and call to schedule your appointment with [facility] in at least one (1) week for the MRI  Central Scheduling [phone]   The Ultrasound is scheduled for 8:30 Tomorrow March 4th

## 2020-10-04 ENCOUNTER — Ambulatory Visit (HOSPITAL_COMMUNITY)
Admission: RE | Admit: 2020-10-04 | Discharge: 2020-10-04 | Disposition: A | Discharge status: Home / Self Care | Payer: Self-pay | Source: Ambulatory Visit | Attending: Orthopedic Surgery | Admitting: Orthopedic Surgery

## 2020-10-04 DIAGNOSIS — M79661 Pain in right lower leg: Secondary | ICD-10-CM

## 2020-10-08 ENCOUNTER — Telehealth: Payer: Self-pay

## 2020-10-08 NOTE — Telephone Encounter (Signed)
Call received from [facility]. They are ordering MRI for patient and wanted to know process. Discussed [employer] application. In review D. Luciana Axe has application and documents from patient for [employer] assistance and will submit today for review. Per [facility] MRI is scheduled for 10/16/20 at [facility]

## 2020-10-16 ENCOUNTER — Other Ambulatory Visit: Payer: Self-pay

## 2020-10-16 ENCOUNTER — Ambulatory Visit (HOSPITAL_COMMUNITY)
Admission: RE | Admit: 2020-10-16 | Discharge: 2020-10-16 | Disposition: A | Discharge status: Home / Self Care | Payer: Self-pay | Source: Ambulatory Visit | Attending: Orthopedic Surgery | Admitting: Orthopedic Surgery

## 2020-10-16 DIAGNOSIS — S83421A Sprain of lateral collateral ligament of right knee, initial encounter: Secondary | ICD-10-CM | POA: Insufficient documentation

## 2020-10-17 ENCOUNTER — Ambulatory Visit: Payer: Medicaid Other | Admitting: Pharmacist

## 2020-10-17 DIAGNOSIS — Z7952 Long term (current) use of systemic steroids: Secondary | ICD-10-CM

## 2020-10-17 DIAGNOSIS — J4551 Severe persistent asthma with (acute) exacerbation: Secondary | ICD-10-CM

## 2020-10-17 NOTE — Progress Notes (Signed)
Patient arrived to clinic to receive 3rd dose of Xolair. However she had forgotten Epipen on home. Patient is rescheduled to 3/24 @1 :20pm with pharmacy clinic. Patient will plan to bring Epipen with her

## 2020-10-17 NOTE — Progress Notes (Deleted)
Patient arrived to clinic to receive 3rd dose of Xolair. However she had forgotten Epipen on home. Patient is rescheduled to 3/24 @1 :20pm with pharmacy clinic. Patient will plan to bring Epipen with her

## 2020-10-23 ENCOUNTER — Other Ambulatory Visit: Payer: Self-pay

## 2020-10-23 ENCOUNTER — Other Ambulatory Visit: Payer: Medicaid Other | Admitting: Pharmacist

## 2020-10-23 ENCOUNTER — Telehealth: Payer: Self-pay | Admitting: Pulmonary Disease

## 2020-10-23 MED ORDER — PREDNISONE 10 MG PO TABS: ORAL_TABLET | ORAL | 0 refills | Status: DC

## 2020-10-23 NOTE — Telephone Encounter (Signed)
Spoke with pt and reviewed Linda Ellis's recommendation. Pt states she had already been taking Symbicort 2 puffs 3 times a day and has been doing so for months now. Pt states understanding and was scheduled for f/u with Dr. Loanne Drilling on 11/26/20. Nothing further needed at this time.

## 2020-10-23 NOTE — Telephone Encounter (Signed)
Spoke with the pt  She c/o sneezing x 2 days  Woke up today wheezing, coughing up brown sputum and having increased SOB  She states that she has not had any f/c/s  She is using her spiriva and symbicort and has already used duoneb x 5 since 3 am today  She states neb gives relief for only about 30 min  Forwarding to APP of the day since Dr Loanne Drilling is unavailable thanks

## 2020-10-23 NOTE — Telephone Encounter (Signed)
Previous action plan set by Dr. Loanne Drilling has been for her to increase Symbicort to 2 puffs three times a day for worsening shortness of breath, wheezing and cough. If you symptoms do not improve in 24-48 hours, start steroid pack.   I will send in but have her try this first. We are conservative with steriods due to osteoporosis. She needs to follow-up with Dr. Loanne Drilling

## 2020-10-28 ENCOUNTER — Other Ambulatory Visit: Payer: Self-pay

## 2020-10-28 ENCOUNTER — Ambulatory Visit: Payer: Medicaid Other | Admitting: Pharmacist

## 2020-10-28 DIAGNOSIS — J4551 Severe persistent asthma with (acute) exacerbation: Secondary | ICD-10-CM

## 2020-10-28 DIAGNOSIS — Z7952 Long term (current) use of systemic steroids: Secondary | ICD-10-CM

## 2020-10-28 MED ORDER — SPIRIVA RESPIMAT 2.5 MCG/ACT IN AERS: 2.0000 | INHALATION_SPRAY | Freq: Every day | RESPIRATORY_TRACT | 1 refills | Status: DC

## 2020-10-28 MED ORDER — OMALIZUMAB 150 MG/ML ~~LOC~~ SOSY: PREFILLED_SYRINGE | SUBCUTANEOUS | 5 refills | Status: DC

## 2020-10-28 MED ORDER — OMALIZUMAB 75 MG/0.5ML ~~LOC~~ SOSY: PREFILLED_SYRINGE | SUBCUTANEOUS | 5 refills | Status: DC

## 2020-10-28 NOTE — Progress Notes (Signed)
HPI Patient presents today to [facility] to see pharmacy team for 3rd Xolair injection.  Past medical history includes severe persistent asthma, allergic rhinitis, GERD, former smoker (31 pack-year history), localized osteoporosis without hx fractures.  Patient took prednisone 40mg  this morning and will continue prednisone taper as prescribed.  Epipen on hand today. Her third Xolair dose was initially scheduled for 10/17/20 but was delayed d/t not having Epipen on hand.  Patient's Xolair dose is 375mg  every 14 days.  Respiratory Medications Current: Symbicort, Spiriva Previously tried: [place] Patient reports adherence challenges - patient receives Symbicort through patient assistance and states that she's been awaiting re-approval notice and signed her portion of paperwork months ago at the same time as Epipen.  OBJECTIVE No Known Allergies  Outpatient Encounter Medications as of 10/28/2020  Medication Sig  . alendronate (FOSAMAX) 70 MG tablet Take 1 tablet (70 mg total) by mouth once a week. Take with a full glass of water on an empty stomach.  . budesonide-formoterol (SYMBICORT) 160-4.5 MCG/ACT inhaler Inhale 2 puffs into the lungs 3 (three) times daily.  . Calcium Carb-Cholecalciferol 500-600 MG-UNIT TABS Take 1 tablet by mouth 2 (two) times daily with a meal.  . Famotidine (PEPCID PO) Take 1 tablet by mouth daily.   . fluticasone (FLONASE) 50 MCG/ACT nasal spray USE 1 SPRAY IN EACH NOSTRIL TWICE DAILY  . ipratropium-albuterol (DUONEB) 0.5-2.5 (3) MG/3ML SOLN Inhale 3 mLs into the lungs every 4 (four) hours as needed.  . loratadine (CLARITIN) 10 MG tablet TAKE 1 Tablet BY MOUTH ONCE EVERY DAY AS NEEDED  . montelukast (SINGULAIR) 10 MG tablet TAKE 1 Tablet BY MOUTH ONCE EVERY NIGHT AT BEDTIME  . omalizumab [place] Right) 150 MG/ML prefilled syringe Inject 300 mg into the skin every 14 (fourteen) days.  Marland Kitchen omalizumab [place] Right) 75 MG/0.5ML prefilled syringe Inject 75 mg into the skin  every 14 (fourteen) days.  Marland Kitchen omeprazole (PRILOSEC) 40 MG capsule TAKE 1 Capsule BY MOUTH ONCE EVERY DAY  . pantoprazole (PROTONIX) 40 MG tablet TAKE 1 TABLET BY MOUTH TWICE DAILY BEFORE A MEAL  . predniSONE (DELTASONE) 10 MG tablet Take 5 tablets po daily x 3 days, then 4 tabs x 3 days; then 3 tabs x3 days; then 2 tabs x3 days; then 1 tab x 3 days; then stop  . promethazine (PHENERGAN) 25 MG tablet Take 1 tablet (25 mg total) by mouth every 8 (eight) hours as needed for nausea or vomiting.  Marland Kitchen PROVENTIL HFA 108 (90 Base) MCG/ACT inhaler INHALE 1 TO 2 PUFFS BY MOUTH EVERY 6 HOURS AS NEEDED FOR COUGHING, WHEEZING, OR SHORTNESS OF BREATH  . Tiotropium Bromide Monohydrate (SPIRIVA RESPIMAT) 1.25 MCG/ACT AERS Inhale 2 puffs into the lungs daily.  . Tiotropium Bromide Monohydrate (SPIRIVA RESPIMAT) 2.5 MCG/ACT AERS Inhale 2 puffs into the lungs daily.  . Tiotropium Bromide Monohydrate (SPIRIVA RESPIMAT) 2.5 MCG/ACT AERS Inhale 2 puffs into the lungs daily.  Marland Kitchen UNKNOWN TO PATIENT OTC Gas medication  . [DISCONTINUED] ALBUTEROL IN Inhale 2 puffs into the lungs as needed. For shortness of breath    No facility-administered encounter medications on file as of 10/28/2020.     Immunization History  Administered Date(s) Administered  . Influenza,inj,Quad PF,6+ Mos 05/04/2018  . PFIZER(Purple Top)SARS-COV-2 Vaccination 11/06/2019, 12/22/2019  . Pneumococcal Polysaccharide-23 08/08/2020     PFTs PFT Results Latest Ref Rng & Units 10/09/2019  FVC-Pre L 2.37  FVC-Predicted Pre % 62  FVC-Post L 2.46  FVC-Predicted Post % 64  Pre FEV1/FVC % %  62  Post FEV1/FCV % % 64  FEV1-Pre L 1.48  FEV1-Predicted Pre % 47  FEV1-Post L 1.57  DLCO uncorrected ml/min/mmHg 22.61  DLCO UNC% % 100  DLCO corrected ml/min/mmHg 24.64  DLCO COR %Predicted % 109  DLVA Predicted % 135  TLC L 5.08  TLC % Predicted % 97  RV % Predicted % 170     Eosinophils Most recent blood eosinophil count was 100 cells/microL taken on  05/17/20  IgE: 1274 on 05/17/20   Assessment   1. Biologics training for Xolair . Omalizumab (Xolair) o MOA: IgG monoclonal antibody (recombinant DNA derived) which inhibits IgE binding to the high-affinity IgE receptor on mast cells and basophils.  o Response to therapy: ??? o Side effects: Anaphylaxis (0.1%) (black boxed warning), injection site reaction (45%), arthralgia (2.9% to 8%), headache (3% to 15%) o Dosing: SubQ every 2-4 weeks based on weight and pretreatment serum IgE o Administration:  1. Doses >150 mg should be divided over more than one injection site (eg, 225 mg or 300 mg administered as two injections, 375 mg administered as three injections) 2.  Do not inject into moles, scars, bruises, tender areas, or broken skin. 3. Injections may take 5 to 10 seconds to administer (solution is slightly viscous). 4. Administer only under direct medical supervision and observe patient for 2 hours after the first 3 injections and 30 minutes after subsequent injections or in accordance with individual institution policies and procedures 5. Recommended injection sites include the upper arm and the front and middle of the thighs.  Patient able to successfully self-administer in abdomen: Xolair 150mg /mL NDC: [address]-86 x 2 Lot:  [id number] Exp: 01/2021  Xolair 75mg /0.31mL NDC: [address]-00 Lot: [id number] Exp: 10/2020  Monitored for 30 min for adverse reaction - tolerated well.  2. Medication Reconciliation  A drug regimen assessment was performed, including review of allergies, interactions, disease-state management, dosing and immunization history. Medications were reviewed with the patient, including name, instructions, indication, goals of therapy, potential side effects, importance of adherence, and safe use.  Completed Symbicort AZ&Me patient assistance application again today (patient signed her portion of form) - will place provider portion in Dr. Cordelia Pen box.  Drug  interaction(s): none identified  3. Immunizations   Patient is indicated for the influenza vaccine. Advised to receive flu shot even if flu season peak has passed due to being immunocompromised.  Patient has received 3 Pfizer COVID19 vaccines - added booster dose in immunization history today  UTD pneumococcal vaccine - received 08/08/20  PLAN - Continue Xolair 375 mg every 14 days. Rx sent to [facility] with note to deliver to patient's home. Patient provided with pharmacy phone number to schedule shipment to home.  Advised to look in mirror while administered to prevent needlestick injury. - Continue Spiriva - 2 puffs daily, montelukast 10mg  daily, fluticasone nasal spray daily, and Symbicort 2 puffs three times daily. - Rx for Spiriva sent to [facility] per patient request as she can receive for free from this pharmacy. - Complete AZ&Me PAP application for Symbicort again. Patient signed at visit and will call AZ&Me tomorrow morning to check status. Will place application in Dr. Cordelia Pen box just in case it was not submitted  - F/u with Dr. Loanne Drilling scheduled 11/26/20  All questions encouraged and answered.  Instructed patient to reach out with any further questions or concerns.  Thank you for allowing pharmacy to participate in this patient's care.  Knox Saliva, PharmD, MPH Clinical Pharmacist (Rheumatology and Pulmonology)

## 2020-11-04 ENCOUNTER — Other Ambulatory Visit: Payer: Self-pay | Admitting: Physician Assistant

## 2020-11-04 DIAGNOSIS — Z7952 Long term (current) use of systemic steroids: Secondary | ICD-10-CM

## 2020-11-04 DIAGNOSIS — J4551 Severe persistent asthma with (acute) exacerbation: Secondary | ICD-10-CM

## 2020-11-06 ENCOUNTER — Ambulatory Visit: Payer: Medicaid Other | Admitting: Nurse Practitioner

## 2020-11-13 ENCOUNTER — Encounter: Payer: Self-pay | Admitting: Internal Medicine

## 2020-11-13 ENCOUNTER — Telehealth: Payer: Self-pay | Admitting: Pulmonary Disease

## 2020-11-13 NOTE — Telephone Encounter (Signed)
Called [facility] and provided consent to ship to patient's home. Called patient and advised. She will call to schedule shipments.

## 2020-11-19 ENCOUNTER — Telehealth: Payer: Self-pay | Admitting: Pulmonary Disease

## 2020-11-19 MED ORDER — PREDNISONE 10 MG PO TABS: ORAL_TABLET | ORAL | 0 refills | Status: DC

## 2020-11-19 NOTE — Telephone Encounter (Signed)
Two unrelated messages were documented on the same encounter thread.[facility] to verify dosage of Symbicort. The prescribed does not reflect the typical recommended dosage of 2 puffs BID. Pharmacy aware. Nothing further needed regarding this first matter documented.  ----------------------------------------------- Called pt regarding her s/s- states that X6 days has had had increased sob at rest and with exertion, sneezing, PND, prod cough with brown mucus. Denies fever, sinus congestion, chills, sore throat. Believes that the pollen is worsening her s/s, is trying to stay inside.   Taking nyquil, dayquil, albuterol inhaler QID, symbicort and spiriva daily.  Unable to use duoneb- nebulizer is broken. Requesting additional recs- would like an abx.   Pharmacy: [facility] in [place]   Dr. Loanne Drilling please advise on recs, thanks

## 2020-11-19 NOTE — Telephone Encounter (Signed)
PCCM Telephone Encounter  Discussed with patient her symptoms of productive cough, shortness of breath and wheezing. She is awaiting her home Xolair and has not received her 4th dose as scheduled.  A/P Asthma exacerbation --Start prednisone taper --Patient to take home Xolair when it arrives --Advised to minimize environmental exposures due to increased pollen during this time of the year --F/u with me in clinic on 4/26 as scheduled  Rodman Pickle, M.D. [facility] Pulmonary/Critical Care Medicine 11/19/2020 1:11 PM

## 2020-11-21 ENCOUNTER — Other Ambulatory Visit: Payer: Self-pay | Admitting: Physician Assistant

## 2020-11-26 ENCOUNTER — Ambulatory Visit (INDEPENDENT_AMBULATORY_CARE_PROVIDER_SITE_OTHER): Payer: Self-pay | Admitting: Pulmonary Disease

## 2020-11-26 ENCOUNTER — Other Ambulatory Visit: Payer: Self-pay

## 2020-11-26 ENCOUNTER — Encounter: Payer: Self-pay | Admitting: Pulmonary Disease

## 2020-11-26 ENCOUNTER — Telehealth: Payer: Self-pay | Admitting: Pharmacy Technician

## 2020-11-26 VITALS — BP 130/84 | HR 101 | Temp 97.3°F | Ht 64.0 in | Wt 345.0 lb

## 2020-11-26 DIAGNOSIS — L659 Nonscarring hair loss, unspecified: Secondary | ICD-10-CM

## 2020-11-26 DIAGNOSIS — Z7952 Long term (current) use of systemic steroids: Secondary | ICD-10-CM

## 2020-11-26 DIAGNOSIS — J4551 Severe persistent asthma with (acute) exacerbation: Secondary | ICD-10-CM

## 2020-11-26 MED ORDER — HYDROCODONE-HOMATROPINE 5-1.5 MG/5ML PO SYRP: 5.0000 mL | ORAL_SOLUTION | Freq: Four times a day (QID) | ORAL | 0 refills | Status: DC | PRN

## 2020-11-26 MED ORDER — SPIRIVA RESPIMAT 2.5 MCG/ACT IN AERS: 2.0000 | INHALATION_SPRAY | Freq: Every day | RESPIRATORY_TRACT | 0 refills | Status: DC

## 2020-11-26 MED ORDER — SPIRIVA RESPIMAT 2.5 MCG/ACT IN AERS: 2.0000 | INHALATION_SPRAY | Freq: Every day | RESPIRATORY_TRACT | 3 refills | Status: DC
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
## 2020-11-26 NOTE — Patient Instructions (Addendum)
Severe persistent allergic asthma with steroid dependence with exacerbation CONTINUE steroid taper CONTINUE Xolair as scheduled. Patient has epi-pen. Will message pharmacy regarding hair loss CONTINUE Symbicort 160-4.5 mcg TWO puffs THREE times daily CONTINUE Spiriva 1.25 mcg TWO puffs ONCE daily. REFILL to medassist. Will provide sample CONTINUE montelukast 10mg  daily CONTINUE Duonebs as needed ORDER hycodan for cough. 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 This is can be purchased over the counter  Osteoporosis Due to chronic prednisone use >5mg  for >3 months  Following Endocrine  Asthma Action Plan Increase Symbicort to 2 puffs three times a day for worsening shortness of breath, wheezing and cough. If you symptoms do not improve in 24-48 hours, start steroid pack that has been prescribed. Please call our office for evaluation and to let us know you steroid the steroid pack (60,50,40,30,20,10 pack).  Work note provided. Recommend sedentary position  Follow-up with me 3 months or sooner as needed

## 2020-11-26 NOTE — Telephone Encounter (Signed)
Received New start paperwork for Linda Ellis. Patient is uninsured.  Faxed forms to [place] for patient assistance.  Phone# [phone]

## 2020-12-03 NOTE — Telephone Encounter (Signed)
Called to check status of application. Still in review. Will follow up in 48 hours.

## 2020-12-05 ENCOUNTER — Telehealth: Payer: Self-pay | Admitting: Pulmonary Disease

## 2020-12-05 NOTE — Telephone Encounter (Signed)
Called Dupixent, rep advised that they need to confirm with the patient if she has any active prescription insurance. Advised that she did not. They will reach out to patient to confirm.  Called patient and provided Dupixent # to call to advised she is uninsured.  Will follow up.

## 2020-12-05 NOTE — Telephone Encounter (Signed)
Called and spoke with Patient.  Patient stated she has not taken her Xolair injection yet, but it is due today.  Patient stated she has a hair brush full of hair and wasn't sure if she needed to have Xolair injection, because of that.  Patient is concerned that if she doesn't have her Xolair she will have a asthma flare. Patient is waiting to [place] after Dr. Loanne Drilling advises.   Message routed to Dr. Loanne Drilling to advise

## 2020-12-05 NOTE — Telephone Encounter (Signed)
Spoke to patient and provided update on her pending [employer] patient assistance application. Patient will call them to confirm she is in fact uninsured, so they can finish processing her application.  Patient would like to know if she should inject her Xolair today, while she awaits approval for Dupixent application. Per chart notes, Xolair is causing patient hair loss. She is worried her asthma will flare up.  Pharmacist is out until Monday will route to Clinical staff to advise patient.

## 2020-12-06 NOTE — Telephone Encounter (Signed)
Called and she states reports she took her Xolair dose yesterday. Advised patient to continue Xolair for now. I will message the pharmacy regarding available samples of Dupixent while awaiting the approval process.

## 2020-12-06 NOTE — Telephone Encounter (Signed)
MD would like to go ahead and transition patient to [place] with samples. Patient took last Xolair injection 12/05/20.

## 2020-12-06 NOTE — Telephone Encounter (Signed)
Faxed Dupix[employer] MyWay print out from [facility] Medicaid's website showing patient only has Family planning coverage along with details from their website of what family planning covers.  Will call to follow up.

## 2020-12-06 NOTE — Telephone Encounter (Signed)
error 

## 2020-12-06 NOTE — Telephone Encounter (Signed)
Pt calling she said that JE would need to contact Medicaid to make sure that her Dupixent is not covered [facility]. Number for her insurance is [phone]. Pt can be reached at [phone]

## 2020-12-09 NOTE — Telephone Encounter (Signed)
Updates will be in previous encounter

## 2020-12-09 NOTE — Telephone Encounter (Signed)
Faxed Dupixent MyWay print out from [place] Medicaid's website showing patient only has Family planning coverage along with details from their website of what family planning covers.  Will call to follow up. 

## 2020-12-11 ENCOUNTER — Telehealth: Payer: Self-pay | Admitting: Pulmonary Disease

## 2020-12-11 MED ORDER — PREDNISONE 10 MG PO TABS: ORAL_TABLET | ORAL | 0 refills | Status: DC

## 2020-12-11 MED ORDER — LEVOFLOXACIN 500 MG PO TABS: 500.0000 mg | ORAL_TABLET | Freq: Every day | ORAL | 0 refills | Status: DC

## 2020-12-11 NOTE — Telephone Encounter (Signed)
Spoke with the pt  She is c/o increased cough x 3 days- prod with large amounts of green to yellow sputum  She states that she is also has more wheezing and SOB than usual for her  She has had some chills, unsure of fever bc her thermometer is broken  She states she tried using the hycodan for her cough has last night and this didn't help, up all night with cough  She is taking her symbicort and spiriva daily as directed She has at home covid test that she states will take today  She has had 3 covid vaccines- last one on 08/21/20  No sick exposures that she is aware of  Please advise thanks!

## 2020-12-11 NOTE — Telephone Encounter (Signed)
Called [employer] My[employer] to follow up on application status. Rep says Medicaid docs have been received showing Family Planning coverage only. App is being reviewed. Should receive determination by Monday 5/16 the latest.

## 2020-12-11 NOTE — Telephone Encounter (Signed)
[facility] Pulmonary Telephone Note  Patient reports 3 day history of shortness of breath, cough with productive colored phlegm. and wheezing. Associated chills and rigors. Unsure if she has fevers as she does not have working thermometer. She recently completed steroid taper for asthma exacerbation and felt better after treatment last week. Home covid test is negative  Asthma Exacerbation  URI, possible pneumonia -Start prednisone taper with plan to continue prednisone 5 mg daily -Start levaquin x5 days -Advised patient to present to ED if symptoms worsen/develops respiratory distress -She expressed understanding and agrees to plan  Rodman Pickle, M.D. [facility] Pulmonary/Critical Care Medicine 12/11/2020 10:31 AM

## 2020-12-16 NOTE — Telephone Encounter (Signed)
Called [employer] MyWay to follow up on patient's application. Rep advised it is still under review, she will send message to reviewer for an update. Will follow up.

## 2020-12-18 ENCOUNTER — Telehealth: Payer: Self-pay | Admitting: Pulmonary Disease

## 2020-12-18 ENCOUNTER — Ambulatory Visit: Payer: Medicaid Other | Admitting: Nurse Practitioner

## 2020-12-18 NOTE — Telephone Encounter (Signed)
Returned call, rep was able to locate documents faxed on May 9th to show patient has [employer] only. She will send message to [facility] to continue processing patient's application. Will update response in previous encounter

## 2020-12-20 NOTE — Telephone Encounter (Signed)
Called [employer] MyWay for update on application - patient approved for [place] patient assistance from 12/19/20 to 12/19/21.   She states she has not taken Xolair for ~3 weeks (unsure of exact date of last dose). States she was having hair loss and headaches that were persistent. Removed Xolair from med list today.  Patient scheduled for Dupixent new start on 12/24/20 @1 :20pm. Pt advised of monitoring time and Epipen requirement. She expressed understanding  Phone number: [phone]  Knox Saliva, PharmD, MPH Clinical Pharmacist (Rheumatology and Pulmonology)

## 2020-12-24 ENCOUNTER — Other Ambulatory Visit: Payer: Self-pay

## 2020-12-24 ENCOUNTER — Ambulatory Visit: Payer: Self-pay | Admitting: Pharmacist

## 2020-12-24 DIAGNOSIS — J4551 Severe persistent asthma with (acute) exacerbation: Secondary | ICD-10-CM

## 2020-12-24 MED ORDER — DUPIXENT 300 MG/2ML ~~LOC~~ SOAJ: 300.0000 mg | SUBCUTANEOUS | 4 refills | Status: DC

## 2020-12-24 NOTE — Progress Notes (Signed)
HPI Linda Ellis presents today to [facility] with friend, Azalee Course, to see pharmacy team for [place] new start.  Past medical history includes severe persistent asthma with oral corticosteroid dependence. She is a former smoker, 31 pack-years and quit in 2018. She has history of GERD, vitamin D deficiency, and pre-diabetes (last A1c 05/29/20 was 6.1%).  Does not have history of conjunctivitis.  Patient never picked up levofloxacin d/t cost (states it was >$60%) even after Goodrx coupon, however she states that she is feeling better now. She states she took her prednisone burst as prescribed, but did not take 0.5 tabs for 14 days as prescribed - she took 0.5 tab daily for one day only and felt that she was having side effects from taking it for too long. She is aware that she should not abruptly discontinue oral steroids.   Last Xolair dose was at least 3 weeks ago. She started Xolair in on 10/03/20 (375mg  every 2 weeks) and reported hair loss and persistent headaches. Today, she states she is unsure if the Xolair was causing the headaches or if it was oral steroids. Hair loss is a listed adverse reaction to Xolair with an incidence of at least 2%.  Headache also has an incidence anywhere from 3-12% with Xolair use. She has not yet had the chance to pick up biotin from the pharmacy as was recommended by Dr. Loanne Drilling.  She states that she felt some relief from [place] on hand and in date today.  Respiratory Medications Current: Symbicort 160/4.5 mcg (2 puffs three times daily), montelukast 10mg  daily, fluticasone nasal spray twice daily, loratadine 10mg  daily as needed, Spiriva Respimat (2 puffs daily)  Tried in past: Dulera (stopped d/t oral rashes), Breo (6 months), Qvar (2 years but then stopped d/t ineffectiveness), Fasenra (March 2021 to December 2021), Xolair (started March 2022, transitioning to [place] d/t hair loss and persistent headaches)  Patient reports no known adherence  challenges.   OBJECTIVE No Known Allergies  Outpatient Encounter Medications as of 12/24/2020  Medication Sig  . alendronate (FOSAMAX) 70 MG tablet Take 1 tablet (70 mg total) by mouth once a week. Take with a full glass of water on an empty stomach.  . budesonide-formoterol (SYMBICORT) 160-4.5 MCG/ACT inhaler Inhale 2 puffs into the lungs 3 (three) times daily.  . Calcium Carb-Cholecalciferol 500-600 MG-UNIT TABS Take 1 tablet by mouth 2 (two) times daily with a meal.  . Famotidine (PEPCID PO) Take 20 mg by mouth in the morning and at bedtime.  . fluticasone (FLONASE) 50 MCG/ACT nasal spray USE 1 SPRAY IN EACH NOSTRIL TWICE DAILY (Patient taking differently: Place 1 spray into both nostrils daily.)  . HYDROcodone-homatropine (HYCODAN) 5-1.5 MG/5ML syrup Take 5 mLs by mouth every 6 (six) hours as needed for cough.  Marland Kitchen ipratropium-albuterol (DUONEB) 0.5-2.5 (3) MG/3ML SOLN INHALE 1 VIAL VIA NEBULIZER EVERY 6 HOURS AS NEEDED  . levofloxacin (LEVAQUIN) 500 MG tablet Take 1 tablet (500 mg total) by mouth daily.  Marland Kitchen loratadine (CLARITIN) 10 MG tablet TAKE 1 Tablet BY MOUTH ONCE EVERY DAY AS NEEDED  . montelukast (SINGULAIR) 10 MG tablet TAKE 1 Tablet BY MOUTH ONCE EVERY NIGHT AT BEDTIME  . omeprazole (PRILOSEC) 40 MG capsule TAKE 1 Capsule BY MOUTH ONCE EVERY DAY  . pantoprazole (PROTONIX) 40 MG tablet TAKE 1 TABLET BY MOUTH TWICE DAILY BEFORE A MEAL  . predniSONE (DELTASONE) 10 MG tablet Take 6 tablets (60 mg total) by mouth daily for 3 days, THEN 5  tablets (50 mg total) daily for 3 days, THEN 4 tablets (40 mg total) daily for 3 days, THEN 3 tablets (30 mg total) daily for 3 days, THEN 2 tablets (20 mg total) daily for 3 days, THEN 1 tablet (10 mg total) daily for 3 days, THEN 0.5 tablets (5 mg total) daily for 14 days.  . promethazine (PHENERGAN) 25 MG tablet Take 1 tablet (25 mg total) by mouth every 8 (eight) hours as needed for nausea or vomiting.  Marland Kitchen PROVENTIL HFA 108 (90 Base) MCG/ACT inhaler  INHALE 1 TO 2 PUFFS BY MOUTH EVERY 6 HOURS AS NEEDED FOR COUGHING, WHEEZING, OR SHORTNESS OF BREATH  . Tiotropium Bromide Monohydrate (SPIRIVA RESPIMAT) 2.5 MCG/ACT AERS Inhale 2 puffs into the lungs daily.  . Tiotropium Bromide Monohydrate (SPIRIVA RESPIMAT) 2.5 MCG/ACT AERS Inhale 2 puffs into the lungs daily.  Marland Kitchen UNKNOWN TO PATIENT OTC Gas medication  . [DISCONTINUED] ALBUTEROL IN Inhale 2 puffs into the lungs as needed. For shortness of breath    No facility-administered encounter medications on file as of 12/24/2020.     Immunization History  Administered Date(s) Administered  . Influenza,inj,Quad PF,6+ Mos 05/04/2018  . PFIZER(Purple Top)SARS-COV-2 Vaccination 11/06/2019, 12/22/2019, 08/21/2020  . Pneumococcal Polysaccharide-23 08/08/2020     PFTs PFT Results Latest Ref Rng & Units 10/09/2019  FVC-Pre L 2.37  FVC-Predicted Pre % 62  FVC-Post L 2.46  FVC-Predicted Post % 64  Pre FEV1/FVC % % 62  Post FEV1/FCV % % 64  FEV1-Pre L 1.48  FEV1-Predicted Pre % 47  FEV1-Post L 1.57  DLCO uncorrected ml/min/mmHg 22.61  DLCO UNC% % 100  DLCO corrected ml/min/mmHg 24.64  DLCO COR %Predicted % 109  DLVA Predicted % 135  TLC L 5.08  TLC % Predicted % 97  RV % Predicted % 170     Eosinophils Most recent blood eosinophil count was 100 (absolute) cells/microL taken on 05/17/20  IgE: 1274 on 05/17/20   Assessment   1. Biologics training (Dupilumab (Dupixent) o MOA: a human monoclonal IgG4 antibody that inhibits interleukin-4 and interleukin-13 cytokine-induced responses, including release of proinflammatory cytokines, chemokines, and IgE o Response to therapy: up to 3 months to see maximum effectiveness o Side effects: injection site reaction (6-18%), antibody development (5-16%), ophthalmic conjunctivitis (2-16%), transient blood eosinophilia (1-2%). We reviewed that hair loss is not an expected adverse reaction with Dupixent. o Dosing: 600 mg followed by 300 mg subQ every 14  days o Administration:  1. Do not shake.  2. Do not use if solution is discolored or contains particulate matter or if window on prefilled pen is yellow (indicates pen has been used).  1. Administer as a subcutaneous injection into the thigh or lower abdomen (avoiding areas within 2 inches of navel); caregiver may administer in upper arm.  2. Rotate injection sites, including initial doses (administer 600 mg initial dose as two 300 mg injections at different sites).   Patient able to successfully self-administer in right and left abdomen: Dupixent 300mg /mL x 2 autoinjectors NDC: [address]-99   x 2 Lot:  [id number] Exp: 07/02/22  2. Medication Reconciliation  A drug regimen assessment was performed, including review of allergies, interactions, disease-state management, dosing and immunization history. Medications were reviewed with the patient, including name, instructions, indication, goals of therapy, potential side effects, importance of adherence, and safe use.  Drug interaction(s): none identified  Approved for Symbicort through [place] patient assistance  3. Immunizations  She is UTD on pneumonia and COVID19 vaccines. Has received 3 [employer]  COIVD19 vaccines.  PLAN - Continue Dupixent 300mg  every 14 days (next dose is due 01/07/21, etc). She has already scheduled shipment to arrive to her home on Thursday, 12/26/20 from [place]. She is approved through 12/19/21. - Continue Symbicort, montelukast, Spiriva as prescribed. Discussed in detail that [place] is an escalation of therapy and continued use of maintenance asthma regimen is indicated. - She will plan to pick up biotin from pharmacy to help with hair growth though we discussed the expectation that it likely would not increase hair growth significantly.  All questions encouraged and answered.  Instructed patient to reach out with any further questions or concerns.  Thank you for allowing pharmacy to participate in this patient's  care.  Knox Saliva, PharmD, MPH Clinical Pharmacist (Rheumatology and Pulmonology)

## 2020-12-24 NOTE — Patient Instructions (Addendum)
Continue Dupixent 300mg  (1 pen only) every 2 weeks  Discard any remaining Xolair appropriately at home.  You can pick up biotin at any pharmacy, available over-the-counter. Might help with hair loss  Dupilumab injection What is this medicine? DUPILUMAB (doo PIL ue mab) is an injection used to treat certain patients with eczema, asthma, and sinus inflammation with nasal polyps. This medicine may be used for other purposes; ask your health care provider or pharmacist if you have questions. COMMON BRAND NAME(S): DUPIXENT What should I tell my health care provider before I take this medicine? They need to know if you have any of these conditions:  asthma  parasitic (helminth) infection  an unusual or allergic reaction to dupilumab, other medicines, foods, dyes, or preservatives  pregnant or trying to get pregnant  breast-feeding How should I use this medicine? This medicine is for injection under the skin. You will be taught how to prepare and give this medicine. Use exactly as directed. Take your medicine at regular intervals. Do not take your medicine more often than directed. It is important that you put your used needles and syringes in a special sharps container. Do not put them in a trash can. If you do not have a sharps container, call your pharmacist or healthcare provider to get one. Talk to your pediatrician regarding the use of this medicine in children. While this medicine may be prescribed for children as young as 6 years for selected conditions, precautions do apply. Overdosage: If you think you have taken too much of this medicine contact a poison control center or emergency room at once. NOTE: This medicine is only for you. Do not share this medicine with others. What if I miss a dose? If you miss a dose, take it as soon as you can if it is within 7 days from the missed dose. If it is more than 7 days from your last dose and you are on an every other week dosing schedule,  skip the dose and wait to take the next dose on the original schedule. If it is more than 7 days from your last dose and you are on an every 4-week dosing schedule, take a dose as soon as possible and start a new schedule based on this date. Do not take double or extra doses. What may interact with this medicine?  vaccines  warfarin This list may not describe all possible interactions. Give your health care provider a list of all the medicines, herbs, non-prescription drugs, or dietary supplements you use. Also tell them if you smoke, drink alcohol, or use illegal drugs. Some items may interact with your medicine. What should I watch for while using this medicine? Tell your doctor or healthcare professional if your symptoms do not start to get better or if they get worse. You should not receive certain vaccines while using this medicine. Dupilumab may prevent a vaccine from working. Talk to your health care provider if you need to receive a vaccine while using this medicine. What side effects may I notice from receiving this medicine? Side effects that you should report to your doctor or health care professional as soon as possible:  allergic reactions like skin rash, itching or hives, swelling of the face, lips, or tongue  changes in vision  eye pain or swelling Side effects that usually do not require medical attention (report these to your doctor or health care professional if they continue or are bothersome):  cold sores  dry or itching eyes  pain, redness, or irritation at site where injected This list may not describe all possible side effects. Call your doctor for medical advice about side effects. You may report side effects to FDA at 1-800-FDA-1088. Where should I keep my medicine? Keep out of the reach of children. Store this medicine in the refrigerator between 2 and 8 degrees C (36 and 46 degrees F) until you are ready to prepare your injection; do not freeze. You may also  store at room temperature for up to 14 days if needed. Do not store above 25 degrees C (77 degrees F) or expose to heat. Throw away any unused medicine after the expiration date. NOTE: This sheet is a summary. It may not cover all possible information. If you have questions about this medicine, talk to your doctor, pharmacist, or health care provider.  2021 Elsevier/Gold Standard (2019-01-23 11:55:26)

## 2020-12-26 ENCOUNTER — Ambulatory Visit: Payer: Medicaid Other | Admitting: Internal Medicine

## 2021-01-01 ENCOUNTER — Ambulatory Visit: Payer: Medicaid Other | Admitting: Internal Medicine

## 2021-01-02 ENCOUNTER — Other Ambulatory Visit (HOSPITAL_COMMUNITY)
Admission: RE | Admit: 2021-01-02 | Discharge: 2021-01-02 | Disposition: A | Discharge status: Home / Self Care | Payer: Medicaid Other | Source: Ambulatory Visit | Attending: "Endocrinology | Admitting: "Endocrinology

## 2021-01-02 ENCOUNTER — Telehealth: Payer: Self-pay

## 2021-01-02 ENCOUNTER — Other Ambulatory Visit: Payer: Self-pay

## 2021-01-02 DIAGNOSIS — M816 Localized osteoporosis [Lequesne]: Secondary | ICD-10-CM | POA: Insufficient documentation

## 2021-01-02 DIAGNOSIS — E559 Vitamin D deficiency, unspecified: Secondary | ICD-10-CM | POA: Insufficient documentation

## 2021-01-02 LAB — COMPREHENSIVE METABOLIC PANEL
ALT: 21 U/L (ref 0–44)
AST: 19 U/L (ref 15–41)
Albumin: 3.4 g/dL — ABNORMAL LOW (ref 3.5–5.0)
Alkaline Phosphatase: 86 U/L (ref 38–126)
Anion gap: 8 (ref 5–15)
BUN: 6 mg/dL (ref 6–20)
CO2: 23 mmol/L (ref 22–32)
Calcium: 8.6 mg/dL — ABNORMAL LOW (ref 8.9–10.3)
Chloride: 103 mmol/L (ref 98–111)
Creatinine, Ser: 0.83 mg/dL (ref 0.44–1.00)
GFR, Estimated: >60 mL/min (ref >60)
Glucose, Bld: 103 mg/dL — ABNORMAL HIGH (ref 70–99)
Potassium: 3.6 mmol/L (ref 3.5–5.1)
Sodium: 134 mmol/L — ABNORMAL LOW (ref 135–145)
Total Bilirubin: 0.4 mg/dL (ref 0.3–1.2)
Total Protein: 6.8 g/dL (ref 6.5–8.1)

## 2021-01-02 LAB — VITAMIN D 25 HYDROXY (VIT D DEFICIENCY, FRACTURES): Vit D, 25-Hydroxy: 27.62 ng/mL — ABNORMAL LOW (ref 30–100)

## 2021-01-02 NOTE — Telephone Encounter (Signed)
Per Maudie Mercury, patient does not need one right now, you can only have one every 2 years.  Called pt and made her aware.

## 2021-01-02 NOTE — Telephone Encounter (Signed)
Patient said she is due for a bone density but pt has appt on Monday here with labs.

## 2021-01-03 ENCOUNTER — Telehealth: Payer: Self-pay | Admitting: Pulmonary Disease

## 2021-01-03 MED ORDER — AZITHROMYCIN 250 MG PO TABS: ORAL_TABLET | ORAL | 0 refills | Status: DC

## 2021-01-03 NOTE — Telephone Encounter (Signed)
Patient returned call.  Dr. Juanetta Gosling recommendations given.  Understanding stated.  Zpak sent to requested pharmacy.  Nothing further at this time.

## 2021-01-03 NOTE — Telephone Encounter (Signed)
Primary Pulmonologist: Dr Loanne Drilling Last office visit and with whom: 11/26/20-Dr. Loanne Drilling What do we see them for (pulmonary problems): severe asthma Last OV assessment/plan:  Instructions    Return in about 3 months (around 02/25/2021). Severe persistentallergicasthma with steroid dependence with exacerbation CONTINUE steroid taper CONTINUE Xolair as scheduled. Patient has epi-pen. Will message pharmacy regarding hair loss CONTINUE Symbicort 160-4.5 mcg TWO puffs THREE times daily CONTINUE Spiriva 1.25 mcg TWO puffs ONCE daily. REFILL to med[facility]ist. Will provide sample CONTINUE montelukast 10mg  daily CONTINUE Duonebs as needed ORDER hycodan for cough. 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 This is can be purchased over the counter  Osteoporosis Due to chronic prednisone use >5mg  for >3 months  Following Endocrine  Asthma Action Plan Increase Symbicort to 2 puffs three times a day for worsening shortness of breath, wheezing and cough. If you symptoms do not improve in 24-48 hours, start steroid pack that has been prescribed. Please call our office for evaluation and to let us know you steroid the steroid pack (60,50,40,30,20,10 pack).  Work note provided. Recommend sedentary position  Follow-up with me 3 months or sooner as needed        Reason for call:  Called and spoke with Patient.  Patient stated she felt tired, mild body aches yesterday morning. Patient stated yesterday afternoon she felt worse.  Patient stated she was feeling feverish and checked her temp.  Patient stated temp was 101.6. Patient stated she took Tylenol and went to bed. Patient stated this morning she has body aches, fatigue, fever, and nonproductive cough that has started. Patient stated she took 2 at home covid test this morning and they were both positive.  Patient is requesting any prescriptions to go to [facility].  Message routed to Dr. Halford Chessman (Doc of Day)      No Known Allergies  Immunization History  Administered Date(s) Administered  . Influenza,inj,Quad PF,6+ Mos 05/04/2018  . PFIZER(Purple Top)SARS-COV-2 Vaccination 11/06/2019, 12/22/2019, 08/21/2020  . Pneumococcal Polysaccharide-23 08/08/2020

## 2021-01-03 NOTE — Telephone Encounter (Signed)
ATC Patient.  LM to call back.  Zpak order pending.

## 2021-01-03 NOTE — Telephone Encounter (Signed)
Can send script for zpak.  If not better, then she needs ROV next week.

## 2021-01-06 ENCOUNTER — Ambulatory Visit: Payer: Medicaid Other | Admitting: "Endocrinology

## 2021-01-06 ENCOUNTER — Telehealth: Payer: Self-pay | Admitting: Pulmonary Disease

## 2021-01-06 NOTE — Telephone Encounter (Signed)
OK for patient to continue [place]. Please send supply to patient so she can take.  Rodman Pickle, M.D. [facility] 01/06/2021 5:54 PM

## 2021-01-06 NOTE — Telephone Encounter (Signed)
I called and spoke with pt and she is aware that it is ok for her to take the [place].  She stated that she did have a dose to take but her fridge went bad and she stated that it got to room temp and she put it in a cooler to keep it warm and when she spoke with someone about this they told her not to use it and that they would contact her and get a new shipment out to her.  Will forward over to the pharmacy to make them aware.

## 2021-01-06 NOTE — Telephone Encounter (Signed)
Spoke with pt. She tested positive for Covid on 01/03/21. We sent in [place] and she is currently taking.  Body aches are better. Still has low grade fever 100.4 and headaches. Pt denies trouble with breathing symptoms. Pt is due to take Dupixent injection tomorrow and wanted to make sure it is ok to take. Dr Loanne Drilling please advsie.

## 2021-01-07 ENCOUNTER — Telehealth: Payer: Self-pay | Admitting: Pulmonary Disease

## 2021-01-07 DIAGNOSIS — J4551 Severe persistent asthma with (acute) exacerbation: Secondary | ICD-10-CM

## 2021-01-07 DIAGNOSIS — Z7952 Long term (current) use of systemic steroids: Secondary | ICD-10-CM

## 2021-01-07 MED ORDER — ALBUTEROL SULFATE HFA 108 (90 BASE) MCG/ACT IN AERS: INHALATION_SPRAY | RESPIRATORY_TRACT | 5 refills | Status: DC

## 2021-01-07 NOTE — Telephone Encounter (Signed)
[place] for updated on shipment but nurse unable to provide. She states she will call clinic back with update on shipment for patient. Provided direct office number to pharmacy team  Knox Saliva, PharmD, MPH Clinical Pharmacist (Rheumatology and Pulmonology)

## 2021-01-07 NOTE — Telephone Encounter (Signed)
Called and spoke with Patient.  Patient stated she is needing a refill for her Proventil inhaler.  Patient requested refill to be placed with [facility]. Refill sent to requested pharmacy.  Nothing further at this time.

## 2021-01-08 NOTE — Telephone Encounter (Signed)
Per [employer], patient's Dupixent was delivered 01/07/21 for 1 box of 2 pens. Nothing further needed.  Knox Saliva, PharmD, MPH Clinical Pharmacist (Rheumatology and Pulmonology)

## 2021-01-15 ENCOUNTER — Ambulatory Visit (INDEPENDENT_AMBULATORY_CARE_PROVIDER_SITE_OTHER): Payer: No Typology Code available for payment source | Admitting: Pulmonary Disease

## 2021-01-15 ENCOUNTER — Encounter: Payer: Self-pay | Admitting: Pulmonary Disease

## 2021-01-15 ENCOUNTER — Telehealth: Payer: Self-pay | Admitting: Pulmonary Disease

## 2021-01-15 ENCOUNTER — Other Ambulatory Visit: Payer: Self-pay

## 2021-01-15 DIAGNOSIS — J4551 Severe persistent asthma with (acute) exacerbation: Secondary | ICD-10-CM

## 2021-01-15 MED ORDER — PREDNISONE 10 MG PO TABS: ORAL_TABLET | ORAL | 0 refills | Status: AC

## 2021-01-15 NOTE — Telephone Encounter (Signed)
Called and spoke with patient who states that she is calling in regards to getting a prescription to be sent to a pharmacy for her. Patient stated that she tested positive for Covid-19 ~ 2 wks ago and stated that Z-pak was sent in for her but it did not do anything for her and she stated that she is still having a bad cough; pt stated that she is coughing up mucus (pt stated that it is a light tan color), and stated that when she blows her nose nothing is coming out but she can feel it draining in her throat. Denies fever.   Sarah please advise as Dr. Loanne Drilling is off the rest of the week

## 2021-01-15 NOTE — Telephone Encounter (Signed)
Pt is calling in regards to getting a rx to be sent to a pharmacy for her. Pt stated that she tested positive for Covid-19 ~ 2 wks ago and stated that an antibiotic was sent in for her but it did not do anything for her and she stated that she is still having a bad cough; pt stated that she is coughing up mucus (pt stated that it is a light tan color), and stated that in her nose when she blows it nothing is coming out but she can feel it draining in her throat. Pharmacy; [facility] [place], [place] AT [facility] OF [address]. Pls regard; [phone].

## 2021-01-15 NOTE — Patient Instructions (Signed)
Prednisone 10 mg pill >> 3 pills daily for 2 days, 2 pills daily for 2 days, 1 pill daily for 2 days  Follow up in 1 week with Dr. Loanne Drilling or Nurse Practitioner

## 2021-01-15 NOTE — Telephone Encounter (Signed)
She needs a video visit or televisit. With any provider who has openings.  Thanks

## 2021-01-15 NOTE — Telephone Encounter (Signed)
Called and spoke with patient. She has been scheduled with Dr. Halford Chessman at 1045am as a televisit. I have sent a message to both Dr. Halford Chessman and Anderson Malta to make them aware.   Nothing further needed at time of call.

## 2021-01-15 NOTE — Progress Notes (Signed)
CC: Short of breath  Virtual Visit via Telephone Note  I connected with Linda Ellis on 01/15/21 at 10:45 AM EDT by telephone and verified that I am speaking with the correct person using two identifiers.  Location: Patient: home Provider: medical   I discussed the limitations, risks, security and privacy concerns of performing an evaluation and management service by telephone and the availability of in person appointments. I also discussed with the patient that there may be a patient responsible charge related to this service. The patient expressed understanding and agreed to proceed.  Summary: 45 yo female former smoker followed by Dr. Loanne Drilling for severe persistent asthma.  Subjective: She developed headache, fever (101.6), body ache, diarrhea and fatigue on 6/02.  She did home COVID test x 2 on 6/03 and both positive.  She took script for zithromax.  Fever better and diarrhea.  Still having sinus drainage, cough with thick sputum, wheeze, chest tightness and feeling more short of breath.  Ran out of albuterol and can't get this refilled until later this month due to insurance coverage.  Using nebulizer several times per day.  Assessment/Plan:  Acute asthma exacerbation after recent COVID 19 infection. - will give her course of prednisone - will need follow up in office next week; if symptoms persist then she will need chest xray and lab work  Severe, persistent asthma. - continue symbicort, spiriva, singulair - she has been maintained on dupixent - prn duoneb - she will refill albuterol later this month when she is eligible through insurance  I discussed the assessment and treatment plan with the patient. The patient was provided an opportunity to ask questions and all were answered. The patient agreed with the plan and demonstrated an understanding of the instructions.   The patient was advised to call back or seek an in-person evaluation if the symptoms worsen or if the  condition fails to improve as anticipated.  Time spent: 24 minutes  Chesley Mires, MD [place] Pager - [phone] 01/15/2021, 11:26 AM

## 2021-01-22 ENCOUNTER — Ambulatory Visit: Payer: Medicaid Other | Admitting: Acute Care

## 2021-01-27 ENCOUNTER — Ambulatory Visit: Payer: Medicaid Other | Admitting: Physician Assistant

## 2021-01-27 ENCOUNTER — Encounter: Payer: Self-pay | Admitting: Physician Assistant

## 2021-01-27 DIAGNOSIS — M810 Age-related osteoporosis without current pathological fracture: Secondary | ICD-10-CM

## 2021-01-27 DIAGNOSIS — J45909 Unspecified asthma, uncomplicated: Secondary | ICD-10-CM

## 2021-01-27 DIAGNOSIS — K219 Gastro-esophageal reflux disease without esophagitis: Secondary | ICD-10-CM

## 2021-01-27 MED ORDER — LORATADINE 10 MG PO TABS: ORAL_TABLET | ORAL | 1 refills | Status: DC

## 2021-01-27 NOTE — Progress Notes (Signed)
There were no vitals taken for this visit.   Subjective:    Patient ID: Linda Ellis, female    DOB: 1975/12/12, 45 y.o.   MRN: [id number]  HPI: Linda Ellis is a 45 y.o. female presenting on 01/27/2021 for No chief complaint on file.   HPI  This is a telemedicine appointment through [employer] due to coronavirus pandemic.  I connected with  Linda Ellis on 01/27/2021 by a video enabled telemedicine application and verified that I am speaking with the correct person using two identifiers.   I discussed the limitations of evaluation and management by telemedicine. The patient expressed understanding and agreed to proceed.   Pt is at home.  Provider is at office.   Pt was scheduled for in-person appointment in the office but she called requesting virtual appointment because she was not feeling well.    Pt was Last seen in person May 23 2020  She was Covid + June .  She says she is still having symptoms.  Pt was referred to GI for her GERD but she cancelled 4 appts.  She says she also missed her appt with endocrinologist who she sees for osteoporosis.  Pt sees the pulmonologist regularly for her asthma.     Relevant past medical, surgical, family and social history reviewed and updated as indicated. Interim medical history since our last visit reviewed. Allergies and medications reviewed and updated.    Current Outpatient Medications:    albuterol (PROVENTIL HFA) 108 (90 Base) MCG/ACT inhaler, INHALE 1 TO 2 PUFFS BY MOUTH EVERY 6 HOURS AS NEEDED FOR COUGHING, WHEEZING, OR SHORTNESS OF BREATH, Disp: 18 g, Rfl: 5   alendronate (FOSAMAX) 70 MG tablet, Take 1 tablet (70 mg total) by mouth once a week. Take with a full glass of water on an empty stomach., Disp: 12 tablet, Rfl: 3   BIOTIN PO, Take by mouth., Disp: , Rfl:    budesonide-formoterol (SYMBICORT) 160-4.5 MCG/ACT inhaler, Inhale 2 puffs into the lungs 3 (three) times daily., Disp: 1 Inhaler, Rfl: 6   Calcium  Carb-Cholecalciferol 500-600 MG-UNIT TABS, Take 1 tablet by mouth 2 (two) times daily with a meal., Disp: 180 tablet, Rfl: 1   Dupilumab (DUPIXENT) 300 MG/2ML SOPN, 300 mg by Subconjunctival route every 14 (fourteen) days. Loading dose of 600 mg received in clinic on 12/24/20, Disp: 4 mL, Rfl: 4   fluticasone (FLONASE) 50 MCG/ACT nasal spray, USE 1 SPRAY IN EACH NOSTRIL TWICE DAILY (Patient taking differently: Place 1 spray into both nostrils daily.), Disp: 16 g, Rfl: 3   ipratropium-albuterol (DUONEB) 0.5-2.5 (3) MG/3ML SOLN, INHALE 1 VIAL VIA NEBULIZER EVERY 6 HOURS AS NEEDED, Disp: 360 mL, Rfl: 1   loratadine (CLARITIN) 10 MG tablet, TAKE 1 Tablet BY MOUTH ONCE EVERY DAY AS NEEDED, Disp: 90 tablet, Rfl: 1   montelukast (SINGULAIR) 10 MG tablet, TAKE 1 Tablet BY MOUTH ONCE EVERY NIGHT AT BEDTIME, Disp: 90 tablet, Rfl: 1   omeprazole (PRILOSEC) 40 MG capsule, TAKE 1 Capsule BY MOUTH ONCE EVERY DAY, Disp: 90 capsule, Rfl: 1   Tiotropium Bromide Monohydrate (SPIRIVA RESPIMAT) 2.5 MCG/ACT AERS, Inhale 2 puffs into the lungs daily., Disp: 1 each, Rfl: 3   Famotidine (PEPCID PO), Take 20 mg by mouth in the morning and at bedtime. (Patient not taking: Reported on 01/27/2021), Disp: , Rfl:    HYDROcodone-homatropine (HYCODAN) 5-1.5 MG/5ML syrup, Take 5 mLs by mouth every 6 (six) hours as needed for cough. (Patient not taking: Reported on  01/27/2021), Disp: 240 mL, Rfl: 0   pantoprazole (PROTONIX) 40 MG tablet, TAKE 1 TABLET BY MOUTH TWICE DAILY BEFORE A MEAL (Patient not taking: Reported on 01/27/2021), Disp: 180 tablet, Rfl: 0   promethazine (PHENERGAN) 25 MG tablet, Take 1 tablet (25 mg total) by mouth every 8 (eight) hours as needed for nausea or vomiting. (Patient not taking: Reported on 01/27/2021), Disp: 20 tablet, Rfl: 0   UNKNOWN TO PATIENT, OTC Gas medication (Patient not taking: Reported on 01/27/2021), Disp: , Rfl:      Review of Systems  Per HPI unless specifically indicated above      Objective:    There were no vitals taken for this visit.  Wt Readings from Last 3 Encounters:  11/26/20 (!) 345 lb (156.5 kg)  10/03/20 (!) 335 lb (152 kg)  09/13/20 (!) 335 lb 8.6 oz (152.2 kg)    Physical Exam Constitutional:      General: She is not in acute distress.    Appearance: She is not toxic-appearing.  HENT:     Head: Normocephalic and atraumatic.  Pulmonary:     Effort: No respiratory distress.     Comments: Pt is talking in complete sentences without dyspnea Neurological:     Mental Status: She is alert and oriented to person, place, and time.  Psychiatric:        Attention and Perception: Attention normal.        Speech: Speech normal.        Behavior: Behavior normal.    Results for orders placed or performed during the hospital encounter of 01/02/21  Comprehensive metabolic panel  Result Value Ref Range   Sodium 134 (L) 135 - 145 mmol/L   Potassium 3.6 3.5 - 5.1 mmol/L   Chloride 103 98 - 111 mmol/L   CO2 23 22 - 32 mmol/L   Glucose, Bld 103 (H) 70 - 99 mg/dL   BUN 6 6 - 20 mg/dL   Creatinine, Ser 0.83 0.44 - 1.00 mg/dL   Calcium 8.6 (L) 8.9 - 10.3 mg/dL   Total Protein 6.8 6.5 - 8.1 g/dL   Albumin 3.4 (L) 3.5 - 5.0 g/dL   AST 19 15 - 41 U/L   ALT 21 0 - 44 U/L   Alkaline Phosphatase 86 38 - 126 U/L   Total Bilirubin 0.4 0.3 - 1.2 mg/dL   GFR, Estimated >60 >60 mL/min   Anion gap 8 5 - 15  VITAMIN D 25 Hydroxy (Vit-D Deficiency, Fractures)  Result Value Ref Range   Vit D, 25-Hydroxy 27.62 (L) 30 - 100 ng/mL      Assessment & Plan:    Encounter Diagnoses  Name Primary?   Asthma, unspecified asthma severity, unspecified whether complicated, unspecified whether persistent Yes   Gastroesophageal reflux disease, unspecified whether esophagitis present    Osteoporosis, unspecified osteoporosis type, unspecified pathological fracture presence       -no changes today -pt is urged to continue with pulmonologist for her asthma -pt to follow up  here 3 months.  She is to contact office sooner prn

## 2021-01-29 ENCOUNTER — Other Ambulatory Visit: Payer: Self-pay | Admitting: Physician Assistant

## 2021-01-29 MED ORDER — IPRATROPIUM-ALBUTEROL 0.5-2.5 (3) MG/3ML IN SOLN: RESPIRATORY_TRACT | 11 refills | Status: DC

## 2021-02-04 MED ORDER — LORATADINE 10 MG PO TABS: ORAL_TABLET | ORAL | 1 refills | Status: DC

## 2021-02-05 ENCOUNTER — Telehealth: Payer: Self-pay | Admitting: Pulmonary Disease

## 2021-02-05 NOTE — Telephone Encounter (Signed)
Called and spoke with pt who states that she is no better after recent prednisone that was sent in after she had the televisit with Dr. Halford Chessman 6/15. States that she is still wheezing and coughing and does not feel any better. Stated to pt that we should get her in for an OV to further evaluate and she verbalized understanding. Appt scheduled for pt tomorrow as an acute visit with CY. Nothing further needed.

## 2021-02-05 NOTE — Progress Notes (Signed)
02/06/21- 36 yoF former smoker (31 pk yrs) followed by Dr Halford Chessman for severe persistent asthma, complicated by COPD, Allergic Rhinitis, GERD/ hx gastric ulcers,  Televisit 01/15/21 for acute Covid infection with fever, myalgias, diarrhea onset 01/02/21 > Got Zpak 6//3, prednisone, continue Symbicort, Spirva and Singulair, continue Dupixent, continue neb duoneb. She was to return for ov I in 1 week. Did not get Paxlovid or infusion. Seen by PCP 6/27 and instructed to f/u here.  Meds include Proventil hfa, Flonase, Neb Duoneb, Singulair, Claritin, Symbicort 160, Dupixent,  Covid vax- 3 Phizer Despite prednisone taper which she says was milder than what usually works, she feels asthma is ongoing. GI symptoms resolved and never lost taste/ smell. Coughing tan mucus, no blood. Sternal sorenss she attributes to cough.   Hx fracture R lower leg in February. Leg still swells a little, but not sore or cramp. No hx personal or family hx clotting.   ROS-see HPI   + = positive Constitutional:    weight loss, night sweats, fevers, chills, fatigue, lassitude. HEENT:    headaches, difficulty swallowing, tooth/dental problems, sore throat,       sneezing, itching, ear ache, nasal congestion, post nasal drip, snoring CV:    +chest pain, orthopnea, PND, +swelling in lower extremities, anasarca,                                   dizziness, palpitations Resp:   +shortness of breath with exertion or at rest.                +productive cough,   non-productive cough, coughing up of blood.             + change in color of mucus.  +wheezing.   Skin:    rash or lesions. GI:  No-   heartburn, indigestion, abdominal pain, nausea, vomiting, diarrhea,                 change in bowel habits, loss of appetite GU: dysuria, change in color of urine, no urgency or frequency.   flank pain. MS:   joint pain, stiffness, decreased range of motion, back pain. Neuro-     nothing unusual Psych:  change in mood or affect.  depression or  anxiety.   memory loss.  OBJ- Physical Exam General- Alert, Oriented, Affect-appropriate, Distress- none acute, + morbid obesity Skin- rash-none, lesions- none, excoriation- none Lymphadenopathy- none Head- atraumatic            Eyes- Gross vision intact, PERRLA, conjunctivae and secretions clear            Ears- Hearing, canals-normal            Nose- Clear, no-Septal dev, mucus, polyps, erosion, perforation             Throat- Mallampati II -III, mucosa clear , drainage- none, tonsils- atrophic Neck- flexible , trachea midline, no stridor , thyroid nl, carotid no bruit Chest - symmetrical excursion , unlabored           Heart/CV- RRR , no murmur , no gallop  , no rub, nl s1 s2                           - JVD- none , edema- none, stasis changes- none, varices- none           Lung- wheeze+ mild/ unlabored,  cough- none , dullness-none, rub- none           Chest wall-  Abd-  Br/ Gen/ Rectal- Not done, not indicated Extrem- +heabvy legs w/o  edema, no calf tenderness, -Homan's Neuro- grossly intact to observation

## 2021-02-06 ENCOUNTER — Other Ambulatory Visit: Payer: Self-pay

## 2021-02-06 ENCOUNTER — Encounter: Payer: Self-pay | Admitting: Internal Medicine

## 2021-02-06 ENCOUNTER — Ambulatory Visit (INDEPENDENT_AMBULATORY_CARE_PROVIDER_SITE_OTHER): Payer: No Typology Code available for payment source

## 2021-02-06 ENCOUNTER — Ambulatory Visit (INDEPENDENT_AMBULATORY_CARE_PROVIDER_SITE_OTHER): Payer: No Typology Code available for payment source | Admitting: Internal Medicine

## 2021-02-06 VITALS — BP 104/62 | HR 95 | Temp 98.3°F | Ht 64.0 in | Wt 339.4 lb

## 2021-02-06 DIAGNOSIS — U099 Post covid-19 condition, unspecified: Secondary | ICD-10-CM

## 2021-02-06 DIAGNOSIS — J4551 Severe persistent asthma with (acute) exacerbation: Secondary | ICD-10-CM

## 2021-02-06 DIAGNOSIS — J45901 Unspecified asthma with (acute) exacerbation: Secondary | ICD-10-CM

## 2021-02-06 DIAGNOSIS — Z7952 Long term (current) use of systemic steroids: Secondary | ICD-10-CM

## 2021-02-06 DIAGNOSIS — R0600 Dyspnea, unspecified: Secondary | ICD-10-CM

## 2021-02-06 LAB — CBC WITH DIFFERENTIAL/PLATELET
Basophils Absolute: 0.3 10*3/uL — ABNORMAL HIGH (ref 0.0–0.1)
Basophils Relative: 2.8 % (ref 0.0–3.0)
Eosinophils Absolute: 0.3 10*3/uL (ref 0.0–0.7)
Eosinophils Relative: 3.4 % (ref 0.0–5.0)
HCT: 40.7 % (ref 36.0–46.0)
Hemoglobin: 14 g/dL (ref 12.0–15.0)
Lymphocytes Relative: 32.7 % (ref 12.0–46.0)
Lymphs Abs: 3 10*3/uL (ref 0.7–4.0)
MCHC: 34.4 g/dL (ref 30.0–36.0)
MCV: 87 fl (ref 78.0–100.0)
Monocytes Absolute: 0.5 10*3/uL (ref 0.1–1.0)
Monocytes Relative: 5.1 % (ref 3.0–12.0)
Neutro Abs: 5.2 10*3/uL (ref 1.4–7.7)
Neutrophils Relative %: 56 % (ref 43.0–77.0)
Platelets: 415 10*3/uL — ABNORMAL HIGH (ref 150.0–400.0)
RBC: 4.68 Mil/uL (ref 3.87–5.11)
RDW: 13.8 % (ref 11.5–15.5)
WBC: 9.2 10*3/uL (ref 4.0–10.5)

## 2021-02-06 LAB — COMPREHENSIVE METABOLIC PANEL
ALT: 16 U/L (ref 0–35)
AST: 15 U/L (ref 0–37)
Albumin: 4 g/dL (ref 3.5–5.2)
Alkaline Phosphatase: 77 U/L (ref 39–117)
BUN: 7 mg/dL (ref 6–23)
CO2: 27 mEq/L (ref 19–32)
Calcium: 9.6 mg/dL (ref 8.4–10.5)
Chloride: 102 mEq/L (ref 96–112)
Creatinine, Ser: 0.85 mg/dL (ref 0.40–1.20)
GFR: 84.4 mL/min (ref >60.00)
Glucose, Bld: 93 mg/dL (ref 70–99)
Potassium: 4.1 mEq/L (ref 3.5–5.1)
Sodium: 137 mEq/L (ref 135–145)
Total Bilirubin: 0.3 mg/dL (ref 0.2–1.2)
Total Protein: 7.1 g/dL (ref 6.0–8.3)

## 2021-02-06 LAB — APTT: aPTT: 35.5 s — ABNORMAL HIGH (ref 23.4–32.7)

## 2021-02-06 LAB — BRAIN NATRIURETIC PEPTIDE: Pro B Natriuretic peptide (BNP): 33 pg/mL (ref 0.0–100.0)

## 2021-02-06 LAB — D-DIMER, QUANTITATIVE: D-Dimer, Quant: 0.76 mcg/mL FEU — ABNORMAL HIGH (ref <0.50)

## 2021-02-06 MED ORDER — DOXYCYCLINE HYCLATE 100 MG PO TABS
100.0000 mg | ORAL_TABLET | Freq: Two times a day (BID) | ORAL | 0 refills | Status: DC
Start: 2021-02-06 — End: 2021-05-02

## 2021-02-06 MED ORDER — METHYLPREDNISOLONE ACETATE 80 MG/ML IJ SUSP
80.0000 mg | Freq: Once | INTRAMUSCULAR | Status: AC
  Administered 2021-02-06: 80 mg via INTRAMUSCULAR

## 2021-02-06 MED ORDER — PREDNISONE 10 MG PO TABS: ORAL_TABLET | ORAL | 0 refills | Status: DC

## 2021-02-06 NOTE — Assessment & Plan Note (Signed)
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 DDX needs to consider possible residual from Covid, including poss PE, esp given obesity and leg fracture in Feb. Plan- labs including D-dimer, depo 80, extended prednisone taper as she says usually helps, CXR

## 2021-02-06 NOTE — Patient Instructions (Signed)
Order- CXR    dx Asthma exacerbation post Covid infection  Order- lab- CBC w diff, D-dimer, CMET, BNP, PTT     dx dyspnea, post /covid infection  Order- depo 80    dx asthma exacerbation  Script sent for doxycycline antibiotic  Script sent for prednisone taper  Please call as needed

## 2021-02-06 NOTE — Assessment & Plan Note (Signed)
Obvious adverse impact of steroid therapy on weight. Hopefully she can be stabilized. Consider Healthy Weight and [facility]

## 2021-02-20 ENCOUNTER — Other Ambulatory Visit: Payer: Self-pay | Admitting: Physician Assistant

## 2021-02-20 DIAGNOSIS — J4551 Severe persistent asthma with (acute) exacerbation: Secondary | ICD-10-CM

## 2021-02-20 DIAGNOSIS — Z7952 Long term (current) use of systemic steroids: Secondary | ICD-10-CM

## 2021-02-25 ENCOUNTER — Other Ambulatory Visit: Payer: Self-pay

## 2021-02-25 ENCOUNTER — Encounter: Payer: Self-pay | Admitting: Physician Assistant

## 2021-02-25 ENCOUNTER — Ambulatory Visit: Payer: Medicaid Other | Admitting: Physician Assistant

## 2021-02-25 ENCOUNTER — Ambulatory Visit (HOSPITAL_COMMUNITY)
Admission: RE | Admit: 2021-02-25 | Discharge: 2021-02-25 | Disposition: A | Discharge status: Home / Self Care | Payer: No Typology Code available for payment source | Source: Ambulatory Visit | Attending: Physician Assistant | Admitting: Physician Assistant

## 2021-02-25 VITALS — BP 119/79 | HR 97 | Temp 97.8°F | Wt 333.0 lb

## 2021-02-25 DIAGNOSIS — M25552 Pain in left hip: Secondary | ICD-10-CM

## 2021-02-25 DIAGNOSIS — M545 Low back pain, unspecified: Secondary | ICD-10-CM

## 2021-02-25 DIAGNOSIS — M810 Age-related osteoporosis without current pathological fracture: Secondary | ICD-10-CM

## 2021-02-25 NOTE — Progress Notes (Signed)
   BP 119/79   Pulse 97   Temp 97.8 F (36.6 C)   Wt (!) 333 lb (151 kg)   SpO2 95%   BMI 57.16 kg/m    Subjective:    Patient ID: Linda Ellis, female    DOB: [birth year], 45 y.o.   MRN: [id number]  HPI: Linda Ellis is a 45 y.o. female presenting on 02/25/2021 for Hip Pain and back bump   HPI  Pt had a negative covid 19 screening questionnaire.     Pt is in today complains of Hip pain and a back lump hurting.  Pt is treated with fosamax for steroid induced osteoporosis.  Her dexa in October revealed T-score -3.2,    Hip pain for about a month.  It hurts with walking. It hurts when seated.  It stops hurting when she lays down.   It is in the front in the crease.  No recent injury.  The back bump hurts when walking and it sends pain down her back.   The lump has been there for years and only recently started  bothering her.  It only hurts when walking.  It doesn't hurt with bending over.  It doesn't hurt all the time when walking but sometimes it's more severe.    She is starting a job aug 7.  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  She will be at the [place] facility.        Relevant past medical, surgical, family and social history reviewed and updated as indicated. Interim medical history since our last visit reviewed. Allergies and medications reviewed and updated.  Review of Systems  Per HPI unless specifically indicated above     Objective:    BP 119/79   Pulse 97   Temp 97.8 F (36.6 C)   Wt (!) 333 lb (151 kg)   SpO2 95%   BMI 57.16 kg/m   Wt Readings from Last 3 Encounters:  02/25/21 (!) 333 lb (151 kg)  02/06/21 (!) 339 lb 6.4 oz (154 kg)  11/26/20 (!) 345 lb (156.5 kg)    Physical Exam Constitutional:      General: She is not in acute distress.    Appearance: She is obese. She is not toxic-appearing.  HENT:     Head: Normocephalic and atraumatic.  Pulmonary:     Effort: No respiratory distress.   Musculoskeletal:     Lumbar back: Negative right straight leg raise test and negative left straight leg raise test.       Back:     Right hip: Normal range of motion.     Left hip: Tenderness present. No bony tenderness. Normal range of motion.     Comments: Lump lower thoracic/upper lumbar back which is not tender Lumbar back with mild diffuse tenderness  Neurological:     Mental Status: She is alert.           Assessment & Plan:   Encounter Diagnoses  Name Primary?   Left hip pain Yes   Right-sided low back pain without sciatica, unspecified chronicity    Osteoporosis, unspecified osteoporosis type, unspecified pathological fracture presence    Morbid obesity ([place])      -will get Xrays of the left hip and pelvis -expressed concern to pt about her ability to do the job at [employer] since it is a physical job

## 2021-03-14 ENCOUNTER — Ambulatory Visit: Payer: No Typology Code available for payment source | Admitting: Pulmonary Disease

## 2021-03-18 ENCOUNTER — Other Ambulatory Visit: Payer: Self-pay | Admitting: Physician Assistant

## 2021-03-24 ENCOUNTER — Ambulatory Visit: Payer: Medicaid Other | Admitting: "Endocrinology

## 2021-04-11 ENCOUNTER — Telehealth: Payer: Self-pay | Admitting: Pulmonary Disease

## 2021-04-11 MED ORDER — PREDNISONE 10 MG PO TABS: ORAL_TABLET | ORAL | 0 refills | Status: DC

## 2021-04-11 NOTE — Telephone Encounter (Signed)
Called and spoke with Patient. Patient stated she is having a nonproductive cough and wheeze that started last Friday.  Patient stated she would have called sooner, but had a tooth removed,and was unable to talk.   Patient stated she has been using her inhalers, nebs, and dupixent. Patient stated she has used nebs twice today already and it has not helped. Patient stated she feels it is weather related. Patient stated she needed something to help with cough and wheeze sent to [facility].   Message routed to Dr. Loanne Drilling

## 2021-04-11 NOTE — Telephone Encounter (Signed)
Prenisone taper ordered. Please schedule patient to follow-up with me next Wednesday afternoon 04/16/21. Ok to double book or used block spots.

## 2021-04-11 NOTE — Telephone Encounter (Signed)
Call made to patient, confirmed DOB. Made aware of JE recommendations. Appt made for next Wednesday.   Nothing further needed at this time.

## 2021-04-16 ENCOUNTER — Ambulatory Visit: Payer: No Typology Code available for payment source | Admitting: Pulmonary Disease

## 2021-04-26 ENCOUNTER — Other Ambulatory Visit: Payer: Self-pay

## 2021-04-26 ENCOUNTER — Emergency Department (HOSPITAL_COMMUNITY)
Admission: EM | Admit: 2021-04-26 | Discharge: 2021-04-26 | Disposition: A | Discharge status: Home / Self Care | Payer: Self-pay | Attending: Emergency Medicine | Admitting: Emergency Medicine

## 2021-04-26 ENCOUNTER — Emergency Department (HOSPITAL_COMMUNITY): Payer: Self-pay

## 2021-04-26 ENCOUNTER — Encounter (HOSPITAL_COMMUNITY): Payer: Self-pay | Admitting: *Deleted

## 2021-04-26 DIAGNOSIS — J449 Chronic obstructive pulmonary disease, unspecified: Secondary | ICD-10-CM | POA: Insufficient documentation

## 2021-04-26 DIAGNOSIS — Y990 Civilian activity done for income or pay: Secondary | ICD-10-CM | POA: Insufficient documentation

## 2021-04-26 DIAGNOSIS — S39012A Strain of muscle, fascia and tendon of lower back, initial encounter: Secondary | ICD-10-CM | POA: Insufficient documentation

## 2021-04-26 DIAGNOSIS — Z7951 Long term (current) use of inhaled steroids: Secondary | ICD-10-CM | POA: Insufficient documentation

## 2021-04-26 DIAGNOSIS — X500XXA Overexertion from strenuous movement or load, initial encounter: Secondary | ICD-10-CM | POA: Insufficient documentation

## 2021-04-26 DIAGNOSIS — Z87891 Personal history of nicotine dependence: Secondary | ICD-10-CM | POA: Insufficient documentation

## 2021-04-26 DIAGNOSIS — Y9289 Other specified places as the place of occurrence of the external cause: Secondary | ICD-10-CM | POA: Insufficient documentation

## 2021-04-26 DIAGNOSIS — J4550 Severe persistent asthma, uncomplicated: Secondary | ICD-10-CM | POA: Insufficient documentation

## 2021-04-26 LAB — POC URINE PREG, ED: Preg Test, Ur: NEGATIVE

## 2021-04-26 MED ORDER — METHOCARBAMOL 500 MG PO TABS
750.0000 mg | ORAL_TABLET | Freq: Once | ORAL | Status: AC
  Administered 2021-04-26: 750 mg via ORAL
  Filled 2021-04-26: qty 2

## 2021-04-26 MED ORDER — METHOCARBAMOL 750 MG PO TABS: 750.0000 mg | ORAL_TABLET | Freq: Three times a day (TID) | ORAL | 0 refills | Status: DC | PRN

## 2021-04-26 MED ORDER — LIDOCAINE 5 % EX PTCH
1.0000 | MEDICATED_PATCH | CUTANEOUS | Status: DC
  Administered 2021-04-26: 1 via TRANSDERMAL
  Filled 2021-04-26: qty 1

## 2021-04-26 MED ORDER — LIDOCAINE 5 % EX PTCH: 1.0000 | MEDICATED_PATCH | CUTANEOUS | 0 refills | Status: DC

## 2021-04-26 NOTE — ED Triage Notes (Signed)
Injured back lifting at work 2 days ago

## 2021-04-26 NOTE — Discharge Instructions (Signed)
Use medications prescribed.  Avoid lifting,  Bending, twisting or any other activity that worsens your pain over the next week.  Apply an  icepack  to your lower back for 10-15 minutes every 2 hours for the next 2 days.  You should get rechecked if your symptoms are not better over the next 5 days,  Or you develop increased pain,  Weakness in your leg(s) or loss of bladder or bowel function - these are symptoms of a worse injury.

## 2021-04-26 NOTE — ED Provider Notes (Signed)
[facility] EMERGENCY DEPARTMENT Provider Note   CSN: [id number] Arrival date & time: 04/26/21  1124     History Chief Complaint  Patient presents with   Back Pain    Linda Ellis is a 45 y.o. 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  However, she states her pain did not start until after which she went home.  She works in Scientist, research (life sciences) for [employer] and frequently has to pick up boxes weighing up to 50 pounds, although she feels she probably picks up boxes heavier than this on occasion.  She left work 2 nights ago and shortly after developed an aching pain and tight sensation in her mid lower back.  She denies weakness or numbness in her legs and has had no urinary or fecal incontinence or retention.  She does not have a history of back pain or injury.  She has taken Tylenol with equivocal pain relief.  The history is provided by the patient.      Past Medical History:  Diagnosis Date   Asthma 2008   COPD (chronic obstructive pulmonary disease) (HCC)    GERD (gastroesophageal reflux disease)    Multiple gastric ulcers    Osteoporosis    Pneumonia     Patient Active Problem List   Diagnosis Date Noted   Vitamin D deficiency 07/08/2020   Prediabetes 06/25/2020   Localized osteoporosis without current pathological fracture 06/24/2020   Allergic rhinitis 12/07/2019   Morbid obesity ([place]) 10/09/2019   Former smoker 09/25/2019   Medication management 09/25/2019   Severe persistent asthma dependent on systemic steroids with acute exacerbation 07/30/2018   Gastroesophageal reflux disease     Past Surgical History:  Procedure Laterality Date   CHOLECYSTECTOMY     DIAGNOSTIC LAPAROSCOPY WITH REMOVAL OF ECTOPIC PREGNANCY Right 01/24/2017   Procedure: REMOVAL OF ECTOPIC PREGNANCY;  Surgeon: Florian Buff, MD;  Location: [facility];  Service: Gynecology;  Laterality: Right;   LAPAROSCOPIC UNILATERAL SALPINGECTOMY Right 01/24/2017   Procedure: LAPAROSCOPIC UNILATERAL SALPINGECTOMY;  Surgeon: Florian Buff, MD;  Location: [facility];  Service: Gynecology;  Laterality: Right;     OB History     Gravida  5   Para  4   Term  4   Preterm      AB      Living  4      SAB      IAB      Ectopic      Multiple      Live Births              Family History  Problem Relation Age of Onset   Asthma Mother    COPD Mother    Hypertension Father    Hyperlipidemia Father    Asthma Father    COPD Father    Diabetes Maternal Grandfather    Heart attack Maternal Grandfather    Stroke Maternal Grandfather    Hypertension Maternal Grandfather     Social History   Tobacco Use   Smoking status: Former    Packs/day: 1.00    Years: 31.00    Pack years: 31.00    Types: Cigarettes    Start date: 1989    Quit date: 02/24/2019    Years since quitting: 2.1   Smokeless tobacco: Never  Vaping Use  Vaping Use: Never used  Substance Use Topics   Alcohol use: Yes    Comment: occasionally   Drug use: No    Home Medications Prior to Admission medications   Medication Sig Start Date End Date Taking? Authorizing Provider  lidocaine (LIDODERM) 5 % Place 1 patch onto the skin daily. Remove & Discard patch within 12 hours or as directed by MD 04/26/21  Yes Safiatou Islam, Almyra Free, PA-C  methocarbamol (ROBAXIN) 750 MG tablet Take 1 tablet (750 mg total) by mouth every 8 (eight) hours as needed for muscle spasms. 04/26/21  Yes Marine Lezotte, Almyra Free, PA-C  albuterol (PROVENTIL HFA) 108 (90 Base) MCG/ACT inhaler INHALE 1 TO 2 PUFFS BY MOUTH EVERY 6 HOURS AS NEEDED FOR COUGHING, WHEEZING, OR SHORTNESS OF BREATH 02/24/21   [name], PA-C  alendronate (FOSAMAX) 70 MG tablet Take 1 tablet (70 mg total) by mouth once a week. Take with a full glass of water on an empty stomach. 07/08/20   Cassandria Anger, MD   BIOTIN PO Take by mouth.    [provider]  budesonide-formoterol (SYMBICORT) 160-4.5 MCG/ACT inhaler Inhale 2 puffs into the lungs 3 (three) times daily. 02/16/20   Lauraine Rinne, NP  Calcium Carb-Cholecalciferol 500-600 MG-UNIT TABS Take 1 tablet by mouth 2 (two) times daily with a meal. 07/08/20   Nida, Marella Chimes, MD  doxycycline (VIBRA-TABS) 100 MG tablet Take 1 tablet (100 mg total) by mouth 2 (two) times daily. 02/06/21   Deneise Lever, MD  Dupilumab (DUPIXENT) 300 MG/2ML SOPN 300 mg by Subconjunctival route every 14 (fourteen) days. Loading dose of 600 mg received in clinic on 12/24/20 12/24/20   Margaretha Seeds, MD  Famotidine (PEPCID PO) Take 20 mg by mouth in the morning and at bedtime. Patient not taking: Reported on 01/27/2021    [provider]  famotidine (PEPCID) 20 MG tablet Take 20 mg by mouth 2 (two) times daily.    [provider]  fluticasone (FLONASE) 50 MCG/ACT nasal spray USE 1 SPRAY IN EACH NOSTRIL TWICE DAILY Patient taking differently: Place 1 spray into both nostrils daily. 05/14/20   Lauraine Rinne, NP  HYDROcodone-homatropine (HYCODAN) 5-1.5 MG/5ML syrup Take 5 mLs by mouth every 6 (six) hours as needed for cough. 11/26/20   Margaretha Seeds, MD  ipratropium-albuterol (DUONEB) 0.5-2.5 (3) MG/3ML SOLN INHALE 1 VIAL VIA NEBULIZER EVERY 6 HOURS AS NEEDED 03/19/21   [name], PA-C  loratadine (CLARITIN) 10 MG tablet TAKE 1 Tablet BY MOUTH ONCE EVERY DAY AS NEEDED 02/04/21   [name], PA-C  montelukast (SINGULAIR) 10 MG tablet TAKE 1 Tablet BY MOUTH ONCE EVERY NIGHT AT BEDTIME 11/04/20   [name], PA-C  omeprazole (PRILOSEC) 40 MG capsule TAKE 1 Capsule BY MOUTH ONCE EVERY DAY 03/19/21   [name], PA-C  pantoprazole (PROTONIX) 40 MG tablet TAKE 1 TABLET BY MOUTH TWICE DAILY BEFORE A MEAL Patient not taking: Reported on 01/27/2021 11/21/20   [name], PA-C  predniSONE (DELTASONE) 10 MG tablet 6 tabs x 3 days, 5 x  3 days, 4 x 3 days, 3x 3 days, 2 x 3 days, 1 x 3 days 02/06/21   Deneise Lever, MD  predniSONE (DELTASONE) 10 MG tablet Take 6 tablets x three days (60 mg), followed by 5 tablets x three days (50mg ), then 4 tablets x three day(40mg ), then 3 tablets (30mg ) x three days, then 2 tablets (20mg ) x three days, then 1 tablet (10mg ) x three days, then STOP 04/11/21  Margaretha Seeds, MD  promethazine (PHENERGAN) 25 MG tablet Take 1 tablet (25 mg total) by mouth every 8 (eight) hours as needed for nausea or vomiting. Patient not taking: No sig reported 03/11/20   [name], PA-C  Tiotropium Bromide Monohydrate (SPIRIVA RESPIMAT) 2.5 MCG/ACT AERS Inhale 2 puffs into the lungs daily. 11/26/20   Margaretha Seeds, MD  UNKNOWN TO PATIENT OTC Gas medication    [provider]  ALBUTEROL IN Inhale 2 puffs into the lungs as needed. For shortness of breath   10/24/11  [provider]    Allergies    Patient has no known allergies.  Review of Systems   Review of Systems  Constitutional:  Negative for fever.  Respiratory:  Negative for shortness of breath.   Cardiovascular:  Negative for chest pain and leg swelling.  Gastrointestinal:  Negative for abdominal distention, abdominal pain and constipation.  Genitourinary:  Negative for difficulty urinating, dysuria, flank pain, frequency and urgency.  Musculoskeletal:  Positive for back pain. Negative for gait problem and joint swelling.  Skin:  Negative for rash.  Neurological:  Negative for weakness and numbness.  All other systems reviewed and are negative.  Physical Exam Updated Vital Signs BP 131/77 (BP Location: Right Arm)   Pulse 98   Temp 98.2 F (36.8 C)   Resp 19   Ht 5\' 4"  (1.626 m)   Wt (!) 149.7 kg   LMP 04/03/2021   SpO2 99%   BMI 56.65 kg/m   Physical Exam Vitals and nursing note reviewed.  Constitutional:      Appearance: She is well-developed.  HENT:     Head: Normocephalic.  Eyes:     Conjunctiva/sclera:  Conjunctivae normal.  Cardiovascular:     Rate and Rhythm: Normal rate.     Pulses: Normal pulses.     Comments: Pedal pulses normal. Pulmonary:     Effort: Pulmonary effort is normal.  Abdominal:     General: Bowel sounds are normal. There is no distension.     Palpations: Abdomen is soft. There is no mass.  Musculoskeletal:        General: Normal range of motion.     Cervical back: Normal range of motion and neck supple.     Lumbar back: Tenderness present. No swelling, edema or spasms.  Skin:    General: Skin is warm and dry.  Neurological:     Mental Status: She is alert.     Sensory: No sensory deficit.     Motor: No tremor or atrophy.     Gait: Gait normal.     Deep Tendon Reflexes:     Reflex Scores:      Patellar reflexes are 2+ on the right side and 2+ on the left side.    Comments: No strength deficit noted in hip and knee flexor and extensor muscle groups.  Ankle flexion and extension intact.    ED Results / Procedures / Treatments   Labs (all labs ordered are listed, but only abnormal results are displayed) Labs Reviewed  POC URINE PREG, ED    EKG None  Radiology DG Lumbar Spine Complete  Result Date: 04/26/2021 CLINICAL DATA:  Back injury lifting 2 days ago EXAM: LUMBAR SPINE - COMPLETE 4+ VIEW COMPARISON:  None. FINDINGS: There is no evidence of lumbar spine fracture. Alignment is normal. Intervertebral disc spaces are maintained. IMPRESSION: No fracture or dislocation of the lumbar spine. Disc spaces and vertebral body heights are preserved. Electronically Signed  By: Eddie Candle M.D.   On: 04/26/2021 13:30    Procedures Procedures   Medications Ordered in ED Medications  lidocaine (LIDODERM) 5 % 1 patch (1 patch Transdermal Patch Applied 04/26/21 1407)  methocarbamol (ROBAXIN) tablet 750 mg (750 mg Oral Given 04/26/21 1409)    ED Course  I have reviewed the triage vital signs and the nursing notes.  Pertinent labs & imaging results that were  available during my care of the patient were reviewed by me and considered in my medical decision making (see chart for details).    MDM Rules/Calculators/A&P                           No neuro deficit on exam or by history to suggest emergent or surgical presentation.  Imaging is negative for acute fracture.  Outlined  worsened sx that should prompt immediate re-evaluation including distal weakness, bowel/bladder retention/incontinence.  Discussed home treatment including activity as tolerated, heat application, she was prescribed Robaxin and a Lidoderm patch for symptom relief.  Work note given..  Follow-up if symptoms persist or worsen.      Final Clinical Impression(s) / ED Diagnoses Final diagnoses:  Strain of lumbar region, initial encounter    Rx / DC Orders ED Discharge Orders          Ordered    lidocaine (LIDODERM) 5 %  Every 24 hours        04/26/21 1407    methocarbamol (ROBAXIN) 750 MG tablet  Every 8 hours PRN        04/26/21 1407             Landis Martins 04/26/21 Elvera Maria, MD 04/27/21 [phone]

## 2021-04-26 NOTE — ED Notes (Signed)
Patient transported to X-ray 

## 2021-04-27 ENCOUNTER — Telehealth: Payer: Self-pay | Admitting: *Deleted

## 2021-04-27 NOTE — Telephone Encounter (Signed)
Received fax from [employer] stating that they need a re-enrollement application faxed to them :  24/7 via self service web @ [email or web address]  Telephone number to call is [phone] Fax # [phone]  Will route to [place] and triage to follow up.

## 2021-04-28 NOTE — Telephone Encounter (Signed)
Call made to patient, confirmed DOB. Made aware we received a fax requesting a new [place] application. Patient has an appt 9/30 states she will obtain application at that time.   Application is for Symbicort.   Nothing further needed at this time.

## 2021-05-02 ENCOUNTER — Other Ambulatory Visit: Payer: Self-pay

## 2021-05-02 ENCOUNTER — Encounter: Payer: Self-pay | Admitting: Pulmonary Disease

## 2021-05-02 ENCOUNTER — Ambulatory Visit (INDEPENDENT_AMBULATORY_CARE_PROVIDER_SITE_OTHER): Payer: No Typology Code available for payment source | Admitting: Pulmonary Disease

## 2021-05-02 VITALS — BP 124/82 | HR 93 | Temp 97.3°F | Ht 64.0 in | Wt 333.2 lb

## 2021-05-02 DIAGNOSIS — Z79899 Other long term (current) drug therapy: Secondary | ICD-10-CM

## 2021-05-02 DIAGNOSIS — J4551 Severe persistent asthma with (acute) exacerbation: Secondary | ICD-10-CM

## 2021-05-02 DIAGNOSIS — Z7952 Long term (current) use of systemic steroids: Secondary | ICD-10-CM

## 2021-05-02 DIAGNOSIS — J4550 Severe persistent asthma, uncomplicated: Secondary | ICD-10-CM

## 2021-05-02 DIAGNOSIS — M8080XS Other osteoporosis with current pathological fracture, unspecified site, sequela: Secondary | ICD-10-CM

## 2021-05-02 MED ORDER — BUDESONIDE-FORMOTEROL FUMARATE 160-4.5 MCG/ACT IN AERO: 2.0000 | INHALATION_SPRAY | Freq: Three times a day (TID) | RESPIRATORY_TRACT | 5 refills | Status: DC

## 2021-05-02 MED ORDER — MONTELUKAST SODIUM 10 MG PO TABS: ORAL_TABLET | ORAL | 1 refills | Status: DC

## 2021-05-02 MED ORDER — SPIRIVA RESPIMAT 2.5 MCG/ACT IN AERS: 2.0000 | INHALATION_SPRAY | Freq: Every day | RESPIRATORY_TRACT | 3 refills | Status: DC

## 2021-05-02 MED ORDER — SPIRIVA RESPIMAT 2.5 MCG/ACT IN AERS: 2.0000 | INHALATION_SPRAY | Freq: Every day | RESPIRATORY_TRACT | 5 refills | Status: DC

## 2021-05-02 MED ORDER — SPIRIVA RESPIMAT 2.5 MCG/ACT IN AERS: 2.0000 | INHALATION_SPRAY | Freq: Every day | RESPIRATORY_TRACT | 0 refills | Status: DC
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
## 2021-05-02 NOTE — Addendum Note (Signed)
Addended by: Coralie Keens on: 05/02/2021 04:30 PM   Modules accepted: Orders

## 2021-05-05 ENCOUNTER — Other Ambulatory Visit: Payer: Self-pay

## 2021-05-05 ENCOUNTER — Other Ambulatory Visit (HOSPITAL_COMMUNITY)
Admission: RE | Admit: 2021-05-05 | Discharge: 2021-05-05 | Disposition: A | Discharge status: Home / Self Care | Payer: No Typology Code available for payment source | Source: Ambulatory Visit | Attending: Physician Assistant | Admitting: Physician Assistant

## 2021-05-05 ENCOUNTER — Encounter: Payer: Self-pay | Admitting: Physician Assistant

## 2021-05-05 ENCOUNTER — Ambulatory Visit: Payer: Medicaid Other | Admitting: Physician Assistant

## 2021-05-05 VITALS — BP 116/86 | HR 89 | Temp 97.8°F | Wt 334.5 lb

## 2021-05-05 DIAGNOSIS — M810 Age-related osteoporosis without current pathological fracture: Secondary | ICD-10-CM

## 2021-05-05 DIAGNOSIS — H6692 Otitis media, unspecified, left ear: Secondary | ICD-10-CM

## 2021-05-05 DIAGNOSIS — J45909 Unspecified asthma, uncomplicated: Secondary | ICD-10-CM

## 2021-05-05 DIAGNOSIS — R7303 Prediabetes: Secondary | ICD-10-CM | POA: Insufficient documentation

## 2021-05-05 DIAGNOSIS — Z1239 Encounter for other screening for malignant neoplasm of breast: Secondary | ICD-10-CM

## 2021-05-05 MED ORDER — AMOXICILLIN 500 MG PO CAPS: 500.0000 mg | ORAL_CAPSULE | Freq: Three times a day (TID) | ORAL | 0 refills | Status: AC

## 2021-05-05 NOTE — Progress Notes (Signed)
BP 116/86   Pulse 89   Temp 97.8 F (36.6 C)   Wt (!) 334 lb 8 oz (151.7 kg)   SpO2 97%   BMI 57.42 kg/m    Subjective:    Patient ID: Linda Ellis, female    DOB: 1976/05/13, 45 y.o.   MRN: [id number]  HPI: Linda Ellis is a 45 y.o. female presenting on 05/05/2021 for Asthma and Gastroesophageal Reflux   HPI   Pt had a negative covid 19 screening questionnaire.   Chief Complaint  Patient presents with   Asthma   Gastroesophageal Reflux      Pt is still working at [employer] but says it causes her a lot of pain due to the physical nature of the position.  She was seen in ER 9/24 with LBP.  She is using aspercream.  She says the muscle relaxer gave her a HA.  The lidocaine patches were too expensive.   She was seen in this office 7/26 with hip pain.  She says the hip is still hurting also.   She saw pulmology last week as they manage her severe asthma.    Left ear pain x 2 wk.     Relevant past medical, surgical, family and social history reviewed and updated as indicated. Interim medical history since our last visit reviewed. Allergies and medications reviewed and updated.   Current Outpatient Medications:    albuterol (PROVENTIL HFA) 108 (90 Base) MCG/ACT inhaler, INHALE 1 TO 2 PUFFS BY MOUTH EVERY 6 HOURS AS NEEDED FOR COUGHING, WHEEZING, OR SHORTNESS OF BREATH, Disp: 20.1 g, Rfl: PRN   alendronate (FOSAMAX) 70 MG tablet, Take 1 tablet (70 mg total) by mouth once a week. Take with a full glass of water on an empty stomach., Disp: 12 tablet, Rfl: 3   BIOTIN PO, Take by mouth., Disp: , Rfl:    budesonide-formoterol (SYMBICORT) 160-4.5 MCG/ACT inhaler, Inhale 2 puffs into the lungs 3 (three) times daily., Disp: 10 g, Rfl: 5   Calcium Carb-Cholecalciferol 500-600 MG-UNIT TABS, Take 1 tablet by mouth 2 (two) times daily with a meal., Disp: 180 tablet, Rfl: 1   Dupilumab (DUPIXENT) 300 MG/2ML SOPN, 300 mg by Subconjunctival route every 14 (fourteen) days. Loading dose  of 600 mg received in clinic on 12/24/20, Disp: 4 mL, Rfl: 4   famotidine (PEPCID) 20 MG tablet, Take 20 mg by mouth 2 (two) times daily., Disp: , Rfl:    fluticasone (FLONASE) 50 MCG/ACT nasal spray, USE 1 SPRAY IN EACH NOSTRIL TWICE DAILY (Patient taking differently: Place 1 spray into both nostrils daily.), Disp: 16 g, Rfl: 3   ipratropium-albuterol (DUONEB) 0.5-2.5 (3) MG/3ML SOLN, INHALE 1 VIAL VIA NEBULIZER EVERY 6 HOURS AS NEEDED, Disp: 360 mL, Rfl: 1   loratadine (CLARITIN) 10 MG tablet, TAKE 1 Tablet BY MOUTH ONCE EVERY DAY AS NEEDED, Disp: 90 tablet, Rfl: 1   montelukast (SINGULAIR) 10 MG tablet, TAKE 1 Tablet BY MOUTH ONCE EVERY NIGHT AT BEDTIME, Disp: 90 tablet, Rfl: 1   omeprazole (PRILOSEC) 40 MG capsule, TAKE 1 Capsule BY MOUTH ONCE EVERY DAY, Disp: 90 capsule, Rfl: 1   Tiotropium Bromide Monohydrate (SPIRIVA RESPIMAT) 2.5 MCG/ACT AERS, Inhale 2 puffs into the lungs daily., Disp: 1 each, Rfl: 5   Tiotropium Bromide Monohydrate (SPIRIVA RESPIMAT) 2.5 MCG/ACT AERS, Inhale 2 puffs into the lungs daily., Disp: 2 each, Rfl: 0   UNKNOWN TO PATIENT, OTC Gas medication, Disp: , Rfl:    HYDROcodone-homatropine (HYCODAN) 5-1.5 MG/5ML syrup,  Take 5 mLs by mouth every 6 (six) hours as needed for cough. (Patient not taking: Reported on 05/05/2021), Disp: 240 mL, Rfl: 0   lidocaine (LIDODERM) 5 %, Place 1 patch onto the skin daily. Remove & Discard patch within 12 hours or as directed by MD (Patient not taking: Reported on 05/05/2021), Disp: 30 patch, Rfl: 0   methocarbamol (ROBAXIN) 750 MG tablet, Take 1 tablet (750 mg total) by mouth every 8 (eight) hours as needed for muscle spasms. (Patient not taking: Reported on 05/05/2021), Disp: 20 tablet, Rfl: 0   promethazine (PHENERGAN) 25 MG tablet, Take 1 tablet (25 mg total) by mouth every 8 (eight) hours as needed for nausea or vomiting. (Patient not taking: Reported on 05/05/2021), Disp: 20 tablet, Rfl: 0    Review of Systems  Per HPI unless  specifically indicated above     Objective:    BP 116/86   Pulse 89   Temp 97.8 F (36.6 C)   Wt (!) 334 lb 8 oz (151.7 kg)   SpO2 97%   BMI 57.42 kg/m   Wt Readings from Last 3 Encounters:  05/05/21 (!) 334 lb 8 oz (151.7 kg)  05/02/21 (!) 333 lb 3.2 oz (151.1 kg)  04/26/21 (!) 330 lb 0.5 oz (149.7 kg)    Physical Exam Vitals reviewed.  Constitutional:      General: She is not in acute distress.    Appearance: She is well-developed. She is obese. She is not toxic-appearing.  HENT:     Head: Normocephalic and atraumatic.     Right Ear: Tympanic membrane, ear canal and external ear normal.     Left Ear: External ear normal. Tympanic membrane is injected and erythematous.  Cardiovascular:     Rate and Rhythm: Normal rate and regular rhythm.  Pulmonary:     Effort: Pulmonary effort is normal.     Breath sounds: Normal breath sounds.  Abdominal:     General: Bowel sounds are normal.     Palpations: Abdomen is soft. There is no mass.     Tenderness: There is no abdominal tenderness.  Musculoskeletal:     Cervical back: Neck supple.     Right lower leg: No edema.     Left lower leg: No edema.  Lymphadenopathy:     Cervical: No cervical adenopathy.  Skin:    General: Skin is warm and dry.  Neurological:     Mental Status: She is alert and oriented to person, place, and time.  Psychiatric:        Attention and Perception: Attention normal.        Speech: Speech normal.        Behavior: Behavior normal. Behavior is cooperative.          Assessment & Plan:    Encounter Diagnoses  Name Primary?   Prediabetes Yes   Encounter for screening for malignant neoplasm of breast, unspecified screening modality    Left otitis media, unspecified otitis media type    Asthma, unspecified asthma severity, unspecified whether complicated, unspecified whether persistent    Morbid obesity ([place])    Osteoporosis, unspecified osteoporosis type, unspecified pathological fracture  presence       -check A1c due to prediabetes -encouraged Covid booster and flu shot -Refer for screening Mammogram -rx Amoxil for ear infection -Encouraged pt to update her pap.  she has [place] medicaid so in encouraged to call her gyn to schedule -Recomended otc vit d- she is not taking any now -encouraged  weight loss with healthy diet and regular exercise which would help her joint aches -pt to continue with pulmonology for asthma and endocrinology for osteoporosis -pt to follow up 6 months.  She is to contact office sooner prn

## 2021-05-06 ENCOUNTER — Other Ambulatory Visit: Payer: Self-pay

## 2021-05-06 DIAGNOSIS — Z1231 Encounter for screening mammogram for malignant neoplasm of breast: Secondary | ICD-10-CM

## 2021-05-06 LAB — HEMOGLOBIN A1C
Hgb A1c MFr Bld: 5.9 % — ABNORMAL HIGH (ref 4.8–5.6)
Mean Plasma Glucose: 123 mg/dL

## 2021-05-08 ENCOUNTER — Other Ambulatory Visit: Payer: Self-pay | Admitting: "Endocrinology

## 2021-05-08 ENCOUNTER — Other Ambulatory Visit: Payer: Self-pay | Admitting: Physician Assistant

## 2021-05-08 DIAGNOSIS — M816 Localized osteoporosis [Lequesne]: Secondary | ICD-10-CM

## 2021-05-12 ENCOUNTER — Telehealth (INDEPENDENT_AMBULATORY_CARE_PROVIDER_SITE_OTHER): Payer: No Typology Code available for payment source | Admitting: Primary Care

## 2021-05-12 ENCOUNTER — Telehealth: Payer: Self-pay | Admitting: Pulmonary Disease

## 2021-05-12 ENCOUNTER — Encounter: Payer: Self-pay | Admitting: Primary Care

## 2021-05-12 VITALS — Temp 98.9°F | Ht 64.0 in | Wt 333.0 lb

## 2021-05-12 DIAGNOSIS — Z7952 Long term (current) use of systemic steroids: Secondary | ICD-10-CM

## 2021-05-12 DIAGNOSIS — J4551 Severe persistent asthma with (acute) exacerbation: Secondary | ICD-10-CM

## 2021-05-12 MED ORDER — PREDNISONE 10 MG PO TABS: ORAL_TABLET | ORAL | 0 refills | Status: AC

## 2021-05-12 NOTE — Progress Notes (Signed)
I called and left a message to start the visit for the patient.

## 2021-05-12 NOTE — Patient Instructions (Signed)
Start mucinex twice daily Start nasal irrigation twice daily Take prednisone taper as directed Completed amoxicillin  Continue all other respiratory medications

## 2021-05-12 NOTE — Telephone Encounter (Signed)
Called and spoke with patient. She stated that she has been out of work for the past week and her funds are running low. I advised her to call [facility] to see if they were still giving out free covid test kits. She will call us back once she has a test kit and to let us know the results of her test.   Will keep this encounter open for follow up.  

## 2021-05-12 NOTE — Telephone Encounter (Signed)
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 REFILL CONTINUE Spiriva 1.25 mcg TWO puffs ONCE daily. REFILL CONTINUE montelukast 10mg  daily CONTINUE Duonebs as needed   Asthma Action Plan START DUONEBS up to four times a day for worsening shortness of breath, wheezing and cough. If you symptoms do not improve in 24-48 hours, start steroid pack that has been prescribed. Please call our office for evaluation and to let us know you steroid the steroid pack (60,50,40,30,20,10 pack).   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  Patient wanted recommendations first.    Reason for call: Called and spoke with patient. She was seen by her PCP on 05/05/21 for an ear infection. She was prescribed amoxicillin which she did not start until 05/07/21. The next day she started feeling worse by coughing and having a runny nose. Despite starting the amoxicillin, she is coughing up thick brown phlegm. Her nasal discharge has been cloudy. She had a fever on 05/10/21 but has not had one since.   She tested herself for COVID on 05/03/21 but it came back negative. She does not have access to another home covid test. I recommended her to get another one since she is actively having symptoms now.   (examples of things to ask: : When did symptoms start? Fever? Cough? Productive? Color to  sputum? More sputum than usual? Wheezing? Have you needed increased oxygen? Are you taking your respiratory medications? What over the counter measures have you tried?)  No Known Allergies  Immunization History  Administered Date(s) Administered   Influenza,inj,Quad PF,6+ Mos 05/04/2018   PFIZER(Purple Top)SARS-COV-2 Vaccination 11/06/2019, 12/22/2019, 08/21/2020   Pneumococcal Polysaccharide-23 08/08/2020    She denied any increased SOB or wheezing. She is still using her Symbicort 128mcg with Spiriva 2.5. She also has the DuoNebs to use as needed. She has not tried any OTC medications.   Pharmacy is [facility] in [place].   SG, can you please advise JE is not available today? Thanks.

## 2021-05-12 NOTE — Progress Notes (Signed)
Virtual Visit via Video Note  I connected with Linda Ellis on 05/12/21 at  4:30 PM EDT by a video enabled telemedicine application and verified that I am speaking with the correct person using two identifiers.  Location: Patient: Home Provider: Office   I discussed the limitations of evaluation and management by telemedicine and the availability of in person appointments. The patient expressed understanding and agreed to proceed.  History of Present Illness: 45 year old female, former smoker quit in July 2020 (31 pack year hx). PMH significant for severe persistent asthma, allergic rhinitis, morbid obesity.    05/12/2021- Interim  Patient reports having right ear pain, sinus congestion along with pain and cough. Associated sob/wheezing. She is currently on 10 day course of amoxicillin. She is taking Symbicort 160, Spiriva respimat 2.65mcg, Dupixent and Singulair as directed. She has been using nebulizer upwards of 6-7 times a day. She has seen a significant improvement in her respiratory symptoms and reports having less exacerbations since starting Dupixent in May. No new fevers.   Steroids Received in 2019 and 2020 2019 Jan Feb March April May June July Aug Sept Oct Nov Dec    X X XX XX   XX X XX XXX   XX XXX  2020 Jan Feb March April May June July Aug Sept Oct Nov Dec    X X XXXX     X XXX    X      XX  2021 Jan Feb March April May June July Aug Sept Oct Nov Dec      X Fasenra XX Junius Argyle XX XX   XX  2022 Jan Feb March April May June July Aug Sept Oct Nov Dec    X Xolair X X Dupixent X  X X   X  X           Observations/Objective:  - Appears well, has some audible nasal congestion with mild cough. No significant shortness of breath or respiratory distress  Assessment and Plan:  Asthma exacerbation - Secondary to sinusitis, currently on Amoxicillin x 10 days prescribed by PCP. Sending in prednisone taper (60mg  x 3 days; 50mg  x 3 days; 40mg  x 3 days; 30mg  x 3 days; 20mg  x 3 days;  10mg  x 3 days). Advised she start taking mucinex and use saline nasal irrigation twice daily. Continue Symbicort 160 + Spiriva, Dupixent 300mg  injection q 2 weeks and Singulair as directed. If not better consider CXR, sputum culture and switching abx coverage.   Follow Up Instructions:  - Call if symptoms do not improved   I discussed the assessment and treatment plan with the patient. The patient was provided an opportunity to ask questions and all were answered. The patient agreed with the plan and demonstrated an understanding of the instructions.   The patient was advised to call back or seek an in-person evaluation if the symptoms worsen or if the condition fails to improve as anticipated.  I provided 30 minutes of non-face-to-face time during this encounter.   Martyn Ehrich, NP

## 2021-05-12 NOTE — Telephone Encounter (Signed)
I have called the pt and she is aware of appt today via mychart video at 430 with BW>  nothing further is needed.

## 2021-05-14 ENCOUNTER — Ambulatory Visit (HOSPITAL_COMMUNITY): Payer: No Typology Code available for payment source

## 2021-05-16 ENCOUNTER — Telehealth: Payer: Self-pay | Admitting: Pulmonary Disease

## 2021-05-16 MED ORDER — HYDROCOD POLST-CPM POLST ER 10-8 MG/5ML PO SUER: 5.0000 mL | Freq: Two times a day (BID) | ORAL | 0 refills | Status: DC | PRN

## 2021-05-16 NOTE — Telephone Encounter (Signed)
I have called the pt and she is aware of JE recs.  She stated that she may end up in the ER due to she feels that the prednisone that she is taking is not helping.  She is aware that the cough med has been sent to the pharmacy.

## 2021-05-16 NOTE — Telephone Encounter (Signed)
Cough syrup sent in. Patient should be on prednisone taper. If symptoms do not improve, recommend ED evaluation for IV steroids for asthma exacerbation.

## 2021-05-16 NOTE — Telephone Encounter (Signed)
Called and spoke with pt and she stated that she did a televisit with BW on 10/10 and she is not any better.    This is from her 10/10 visit: Instructions   Return if symptoms worsen or fail to improve. Start mucinex twice daily Start nasal irrigation twice daily Take prednisone taper as directed Completed amoxicillin  Continue all other respiratory medications         Pt stated that she is still coughing up yellow/brown sputum and she coughed so hard yesterday that she passed out. She is having trouble breathing and she stated that she can get her lungs cleared out of the mucus but it comes right back.  She is unable to sleep  and stated that she was thinking of going to the ER to be seen.  She wanted to check with JE to see what she thinks. Please advise. thanks

## 2021-05-29 ENCOUNTER — Other Ambulatory Visit: Payer: Self-pay | Admitting: "Endocrinology

## 2021-05-29 DIAGNOSIS — M816 Localized osteoporosis [Lequesne]: Secondary | ICD-10-CM

## 2021-06-04 ENCOUNTER — Telehealth: Payer: Self-pay | Admitting: "Endocrinology

## 2021-06-04 ENCOUNTER — Other Ambulatory Visit: Payer: Self-pay | Admitting: "Endocrinology

## 2021-06-04 NOTE — Telephone Encounter (Signed)
Pt has not been seen since last year, she did blood work last in June, does she need more labs before setting up an appt or can we use labs from June.

## 2021-06-04 NOTE — Telephone Encounter (Signed)
Scheduled appt.

## 2021-06-09 ENCOUNTER — Ambulatory Visit (HOSPITAL_COMMUNITY)
Admission: RE | Admit: 2021-06-09 | Discharge: 2021-06-09 | Disposition: A | Discharge status: Home / Self Care | Payer: No Typology Code available for payment source | Source: Ambulatory Visit | Attending: Physician Assistant | Admitting: Physician Assistant

## 2021-06-09 ENCOUNTER — Encounter (HOSPITAL_COMMUNITY): Payer: Self-pay

## 2021-06-09 ENCOUNTER — Other Ambulatory Visit: Payer: Self-pay

## 2021-06-09 DIAGNOSIS — Z1231 Encounter for screening mammogram for malignant neoplasm of breast: Secondary | ICD-10-CM | POA: Insufficient documentation

## 2021-06-12 ENCOUNTER — Ambulatory Visit: Payer: Medicaid Other | Admitting: "Endocrinology

## 2021-06-25 ENCOUNTER — Other Ambulatory Visit: Payer: Self-pay

## 2021-06-25 ENCOUNTER — Ambulatory Visit (INDEPENDENT_AMBULATORY_CARE_PROVIDER_SITE_OTHER): Payer: Self-pay | Admitting: "Endocrinology

## 2021-06-25 ENCOUNTER — Encounter: Payer: Self-pay | Admitting: "Endocrinology

## 2021-06-25 VITALS — BP 104/78 | HR 80 | Ht 64.0 in | Wt 325.0 lb

## 2021-06-25 DIAGNOSIS — M816 Localized osteoporosis [Lequesne]: Secondary | ICD-10-CM

## 2021-06-25 DIAGNOSIS — R7303 Prediabetes: Secondary | ICD-10-CM

## 2021-06-25 DIAGNOSIS — Z6841 Body Mass Index (BMI) 40.0 and over, adult: Secondary | ICD-10-CM

## 2021-06-25 DIAGNOSIS — E559 Vitamin D deficiency, unspecified: Secondary | ICD-10-CM

## 2021-06-25 DIAGNOSIS — E785 Hyperlipidemia, unspecified: Secondary | ICD-10-CM

## 2021-06-25 MED ORDER — ALENDRONATE SODIUM 70 MG PO TABS: 70.0000 mg | ORAL_TABLET | ORAL | 3 refills | Status: DC

## 2021-06-25 NOTE — Progress Notes (Signed)
06/25/2021      Endocrinology follow-up note   Past Medical History:  Diagnosis Date   Asthma 2008   COPD (chronic obstructive pulmonary disease) (HCC)    GERD (gastroesophageal reflux disease)    Multiple gastric ulcers    Osteoporosis    Pneumonia    Past Surgical History:  Procedure Laterality Date   CHOLECYSTECTOMY     DIAGNOSTIC LAPAROSCOPY WITH REMOVAL OF ECTOPIC PREGNANCY Right 01/24/2017   Procedure: REMOVAL OF ECTOPIC PREGNANCY;  Surgeon: Florian Buff, MD;  Location: [facility];  Service: Gynecology;  Laterality: Right;   LAPAROSCOPIC UNILATERAL SALPINGECTOMY Right 01/24/2017   Procedure: LAPAROSCOPIC UNILATERAL SALPINGECTOMY;  Surgeon: Florian Buff, MD;  Location: [facility];  Service: Gynecology;  Laterality: Right;   Social History   Socioeconomic History   Marital status: Single    Spouse name: Not on file   Number of children: Not on file   Years of education: Not on file   Highest education level: Not on file  Occupational History   Not on file  Tobacco Use   Smoking status: Former    Packs/day: 1.00    Years: 31.00    Pack years: 31.00    Types: Cigarettes    Start date: 77    Quit date: 02/24/2019    Years since quitting: 2.3   Smokeless tobacco: Never  Vaping Use   Vaping Use: Never used  Substance and Sexual Activity   Alcohol use: Yes    Comment: occasionally   Drug use: No   Sexual activity: Not Currently    Birth control/protection: None  Other Topics Concern   Not on file  Social History Narrative   Not on file   Social Determinants of Health   Financial Resource Strain: Not on file  Food Insecurity: Not on file  Transportation Needs: Not on file  Physical Activity: Not on file  Stress: Not on file  Social Connections: Not on file   Outpatient Encounter Medications as of 06/25/2021  Medication Sig   Cholecalciferol (VITAMIN D3) 50 MCG (2000 UT) TABS Take 1 tablet by  mouth daily with breakfast.   [DISCONTINUED] Cholecalciferol (VITAMIN D) 50 MCG (2000 UT) CAPS Take 2,000 Units by mouth daily with breakfast.   albuterol (PROVENTIL HFA) 108 (90 Base) MCG/ACT inhaler INHALE 1 TO 2 PUFFS BY MOUTH EVERY 6 HOURS AS NEEDED FOR COUGHING, WHEEZING, OR SHORTNESS OF BREATH   alendronate (FOSAMAX) 70 MG tablet Take 1 tablet (70 mg total) by mouth once a week. Take with a full glass of water on an empty stomach.   BIOTIN PO Take by mouth.   budesonide-formoterol (SYMBICORT) 160-4.5 MCG/ACT inhaler Inhale 2 puffs into the lungs 3 (three) times daily.   Calcium Carb-Cholecalciferol 500-600 MG-UNIT TABS Take 1 tablet by mouth 2 (two) times daily with a meal.   chlorpheniramine-HYDROcodone (TUSSIONEX PENNKINETIC ER) 10-8 MG/5ML SUER Take 5 mLs by mouth 2 (two) times daily as needed for cough.   Dupilumab (DUPIXENT) 300 MG/2ML SOPN 300 mg by Subconjunctival route every 14 (fourteen) days. Loading dose of 600 mg received in clinic on 12/24/20   famotidine (PEPCID) 20 MG tablet Take  20 mg by mouth 2 (two) times daily.   fluticasone (FLONASE) 50 MCG/ACT nasal spray USE 1 SPRAY IN EACH NOSTRIL TWICE DAILY (Patient taking differently: Place 1 spray into both nostrils daily.)   ipratropium-albuterol (DUONEB) 0.5-2.5 (3) MG/3ML SOLN INHALE 1 VIAL VIA NEBULIZER EVERY 6 HOURS AS NEEDED   loratadine (CLARITIN) 10 MG tablet TAKE 1 Tablet BY MOUTH ONCE EVERY DAY AS NEEDED   montelukast (SINGULAIR) 10 MG tablet TAKE 1 Tablet BY MOUTH ONCE EVERY NIGHT AT BEDTIME   omeprazole (PRILOSEC) 40 MG capsule TAKE 1 Capsule BY MOUTH ONCE EVERY DAY   Tiotropium Bromide Monohydrate (SPIRIVA RESPIMAT) 2.5 MCG/ACT AERS Inhale 2 puffs into the lungs daily.   UNKNOWN TO PATIENT OTC Gas medication   [DISCONTINUED] ALBUTEROL IN Inhale 2 puffs into the lungs as needed. For shortness of breath    [DISCONTINUED] alendronate (FOSAMAX) 70 MG tablet Take 1 tablet (70 mg total) by mouth once a week. Take with a full  glass of water on an empty stomach.   No facility-administered encounter medications on file as of 06/25/2021.   ALLERGIES: No Known Allergies  VACCINATION STATUS: Immunization History  Administered Date(s) Administered   Influenza,inj,Quad PF,6+ Mos 05/04/2018   PFIZER(Purple Top)SARS-COV-2 Vaccination 11/06/2019, 12/22/2019, 08/21/2020   Pneumococcal Polysaccharide-23 08/08/2020     HPI   Linda Ellis is 45 y.o. female who is returning with repeat labs after she was seen in consultation for osteoporosis.   -PCP:  [name], PA-C. See notes from previous visit. History is obtained directly from the patient and chart review. Her medical history is significant for exposure to high-dose steroids related to her COPD which in turn is related to her prior history of heavy smoking. She took prednisone up to 60 mg daily, several times yearly for the last 2 decades. Patient was diagnosed with osteoporosis on recent bone density performed on May 21, 2020.  Her Z score on L1-L4 was -2.9, T score -3.2. -During her previous visit, she was initiated on Fosamax 70 mg p.o. weekly.  She continues to tolerate this medication.  She has no new complaints today.    She denies any history of fragility fractures. -Based on her labs, she was also initiated on vitamin D and calcium supplements    She also eats dairy and green, leafy, vegetables.  She does not take high vitamin A doses. No h/o hyper/hypocalcemia. No h/o hyperparathyroidism. No history of thyroid dysfunction. No h/o kidney stones. No h/o thyrotoxicosis.   No history of  CKD.  She still has regular menstrual cycles. She has no family history of premenopausal osteoporosis.   I reviewed her chart and she also has a history of COPD which requires multiple bronchodilators, as well as recent initiation of omalizumab. She also has prediabetes, morbid obesity, hyperlipidemia.  She presents with 14 pounds of overall weight loss since  last visit.  Review of Systems  Constitutional: + Progressive weight gain + fatigue, no subjective hyperthermia, no subjective hypothermia   Objective:    BP 104/78   Pulse 80   Ht 5\' 4"  (1.626 m)   Wt (!) 325 lb (147.4 kg)   LMP 06/04/2021   BMI 55.79 kg/m   Wt Readings from Last 3 Encounters:  06/25/21 (!) 325 lb (147.4 kg)  05/12/21 (!) 333 lb (151 kg)  05/05/21 (!) 334 lb 8 oz (151.7 kg)    Physical Exam  Constitutional:  + BMI of 56.4 weight for height, not in acute distress, normal state  of mind, + cushingoid appearance Eyes: PERRLA, EOMI, no exophthalmos ENT: moist mucous membranes, no thyromegaly, no cervical lymphadenopathy Cardiovascular: normal precordial activity, Regular Rate and Rhythm, no Murmur/Rubs/Gallops, + bilateral supraclavicular fullness, +dorsal cervical fat pad.   CMP ( most recent) CMP     Component Value Date/Time   NA 137 02/06/2021 1124   K 4.1 02/06/2021 1124   CL 102 02/06/2021 1124   CO2 27 02/06/2021 1124   GLUCOSE 93 02/06/2021 1124   BUN 7 02/06/2021 1124   CREATININE 0.85 02/06/2021 1124   CALCIUM 9.6 02/06/2021 1124   PROT 7.1 02/06/2021 1124   ALBUMIN 4.0 02/06/2021 1124   AST 15 02/06/2021 1124   ALT 16 02/06/2021 1124   ALKPHOS 77 02/06/2021 1124   BILITOT 0.3 02/06/2021 1124   GFRNONAA >60 01/02/2021 1326   GFRAA >60 08/23/2019 1125   Recent Results (from the past 2160 hour(s))  POC Urine Pregnancy, ED (not at [facility])     Status: None   Collection Time: 04/26/21  1:08 PM  Result Value Ref Range   Preg Test, Ur NEGATIVE NEGATIVE    Comment:        THE SENSITIVITY OF THIS METHODOLOGY IS >24 mIU/mL   Hemoglobin A1c     Status: Abnormal   Collection Time: 05/05/21  1:40 PM  Result Value Ref Range   Hgb A1c MFr Bld 5.9 (H) 4.8 - 5.6 %    Comment: (NOTE)         Prediabetes: 5.7 - 6.4         Diabetes: >6.4         Glycemic control for adults with diabetes: <7.0    Mean Plasma Glucose 123 mg/dL    Comment:  (NOTE) Performed At: [facility] [employer] [address] [place], [place] [id number] Rush Farmer MD XF:[id number]     Diabetic Labs (most recent): Lab Results  Component Value Date   HGBA1C 5.9 (H) 05/05/2021   HGBA1C 6.1 (H) 05/29/2020   HGBA1C 5.8 (H) 08/23/2019     Lipid Panel ( most recent) Lipid Panel     Component Value Date/Time   CHOL 250 (H) 05/29/2020 0838   TRIG 91 05/29/2020 0838   HDL 103 05/29/2020 0838   CHOLHDL 2.4 05/29/2020 0838   VLDL 18 05/29/2020 0838   LDLCALC 129 (H) 05/29/2020 0838     Bone density from May 21, 2020 Right femur neck T score +0.0, Z score +0.3 Left femoral neck T score -0.3, Z score +0.0 AP L1, L3 and L4 T score -3.2, Z score -2.9     Assessment: 1. Glucocorticoid induced osteoporosis 2. Morbid obesity 3. Prediabetes 4. Dyslipidemia 5.  Vitamin D deficiency   Plan: 1. Premenopausal osteoporosis -She had significant abnormality in her bone density although no comparison with  previous studies, it is  likely glucocorticoid induced.  It is possible that she never achieved optimal bone density.  -She was initiated on Fosamax 70 mg p.o. weekly which she continues to tolerate.  She is advised to continue.  Side effects and precautions with Fosamax discussed with her.   She gives history of heavy exposure to steroids related to COPD/asthma which in turn is related to her history of smoking for 30+ years before she quit in 2020.  Given her T score of -3, Z score -2.9 , she has increased risk of fracture.   -She still has vitamin D deficiency, advised to continue vitamin D 2000 units daily in addition to her  Os-Cal supplement.   She would also continue on calcium carbonate/vitamin D 500/600 mg p.o. daily afterwards.   -I discussed fall precautions to avoid fractures.  Regarding her prediabetes/morbid obesity: Her previsit labs show A1c of 5.6%, improving from 6.1%.  [employer], plant predominant lifestyle nutrition  discussed and recommended to her.   - she acknowledges that there is a room for improvement in her food and drink choices. - Suggestion is made for her to avoid simple carbohydrates  from her diet including Cakes, Sweet Desserts, Ice Cream, Soda (diet and regular), Sweet Tea, Candies, Chips, Cookies, Store Bought Juices, Alcohol in Excess of  1-2 drinks a day, Artificial Sweeteners,  Coffee Creamer, and "Sugar-free" Products, Lemonade. This will help patient to have more stable blood glucose profile and potentially avoid unintended weight gain.   - I advised patient to maintain close follow up with [name], PA-C for primary care needs.   I spent 31 minutes in the care of the patient today including review of labs from Thyroid Function, CMP, and other relevant labs ; imaging/biopsy records (current and previous including abstractions from other facilities); face-to-face time discussing  her lab results and symptoms, medications doses, her options of short and long term treatment based on the latest standards of care / guidelines;   and documenting the encounter.  [name]  participated in the discussions, expressed understanding, and voiced agreement with the above plans.  All questions were answered to her satisfaction. she is encouraged to contact clinic should she have any questions or concerns prior to her return visit.   Follow up plan: Return in about 6 months (around 12/23/2021) for F/U with Pre-visit Labs, A1c -NV.   Glade Lloyd, MD [facility] [address] [place], [place] [address] Phone: [phone]  Fax: [phone]     06/25/2021, 1:57 PM  This note was partially dictated with voice recognition software. Similar sounding words can be transcribed inadequately or may not  be corrected upon review.

## 2021-06-25 NOTE — Patient Instructions (Signed)

## 2021-07-02 ENCOUNTER — Ambulatory Visit: Payer: Medicaid Other | Admitting: Physician Assistant

## 2021-07-02 ENCOUNTER — Encounter: Payer: Self-pay | Admitting: Physician Assistant

## 2021-07-02 DIAGNOSIS — J4550 Severe persistent asthma, uncomplicated: Secondary | ICD-10-CM

## 2021-07-02 DIAGNOSIS — B354 Tinea corporis: Secondary | ICD-10-CM

## 2021-07-02 MED ORDER — FLUTICASONE PROPIONATE 50 MCG/ACT NA SUSP: 1.0000 | Freq: Two times a day (BID) | NASAL | 3 refills | Status: DC

## 2021-07-02 NOTE — Progress Notes (Signed)
LMP 06/04/2021    Subjective:    Patient ID: Linda Ellis, female    DOB: [birth year], 45 y.o.   MRN: [id number]  HPI: Linda Ellis is a 45 y.o. female presenting on 07/02/2021 for No chief complaint on file.   HPI  This is a telemedicine appointment through [employer].  Pt was scheduled for in person appointment but called morning of her appointment stating she didn't feel well and requested virtual appointment.   I connected with  Linda Ellis on 07/02/21 by a video enabled telemedicine application and verified that I am speaking with the correct person using two identifiers.   I discussed the limitations of evaluation and management by telemedicine. The patient expressed understanding and agreed to proceed.  Pt is at home.  Provider is at office.   Pt says she has been Feeling bad for a few days but it got worse last night.  She has Coughing and sneezing and  Some SOB.   She has asthma with frequent exacerbations but says she has been doing better since starting dupixent.    Her appointment today is for a Rash.  She has had it for about a month.  She has 2 red circles on stomach.  Then it got scaley and was peeling.  Now she has an area that started on eye lid.   Relevant past medical, surgical, family and social history reviewed and updated as indicated. Interim medical history since our last visit reviewed. Allergies and medications reviewed and updated.    Current Outpatient Medications:    albuterol (PROVENTIL HFA) 108 (90 Base) MCG/ACT inhaler, INHALE 1 TO 2 PUFFS BY MOUTH EVERY 6 HOURS AS NEEDED FOR COUGHING, WHEEZING, OR SHORTNESS OF BREATH, Disp: 20.1 g, Rfl: PRN   alendronate (FOSAMAX) 70 MG tablet, Take 1 tablet (70 mg total) by mouth once a week. Take with a full glass of water on an empty stomach., Disp: 12 tablet, Rfl: 3   BIOTIN PO, Take by mouth., Disp: , Rfl:    budesonide-formoterol (SYMBICORT) 160-4.5 MCG/ACT inhaler, Inhale 2 puffs into the lungs 3 (three)  times daily., Disp: 10 g, Rfl: 5   Calcium Carb-Cholecalciferol 500-600 MG-UNIT TABS, Take 1 tablet by mouth 2 (two) times daily with a meal., Disp: 180 tablet, Rfl: 1   Cholecalciferol (VITAMIN D3) 50 MCG (2000 UT) TABS, Take 1 tablet by mouth daily with breakfast., Disp: , Rfl:    Dupilumab (DUPIXENT) 300 MG/2ML SOPN, 300 mg by Subconjunctival route every 14 (fourteen) days. Loading dose of 600 mg received in clinic on 12/24/20, Disp: 4 mL, Rfl: 4   famotidine (PEPCID) 20 MG tablet, Take 20 mg by mouth 2 (two) times daily., Disp: , Rfl:    ipratropium-albuterol (DUONEB) 0.5-2.5 (3) MG/3ML SOLN, INHALE 1 VIAL VIA NEBULIZER EVERY 6 HOURS AS NEEDED, Disp: 360 mL, Rfl: 1   loratadine (CLARITIN) 10 MG tablet, TAKE 1 Tablet BY MOUTH ONCE EVERY DAY AS NEEDED, Disp: 90 tablet, Rfl: 1   montelukast (SINGULAIR) 10 MG tablet, TAKE 1 Tablet BY MOUTH ONCE EVERY NIGHT AT BEDTIME, Disp: 90 tablet, Rfl: 1   omeprazole (PRILOSEC) 40 MG capsule, TAKE 1 Capsule BY MOUTH ONCE EVERY DAY, Disp: 90 capsule, Rfl: 1   Tiotropium Bromide Monohydrate (SPIRIVA RESPIMAT) 2.5 MCG/ACT AERS, Inhale 2 puffs into the lungs daily., Disp: 1 each, Rfl: 5   UNKNOWN TO PATIENT, OTC Gas medication, Disp: , Rfl:    chlorpheniramine-HYDROcodone (TUSSIONEX PENNKINETIC ER) 10-8 MG/5ML SUER, Take 5  mLs by mouth 2 (two) times daily as needed for cough. (Patient not taking: Reported on 07/02/2021), Disp: 70 mL, Rfl: 0   fluticasone (FLONASE) 50 MCG/ACT nasal spray, USE 1 SPRAY IN EACH NOSTRIL TWICE DAILY (Patient not taking: Reported on 07/02/2021), Disp: 16 g, Rfl: 3   Review of Systems  Per HPI unless specifically indicated above     Objective:    LMP 06/04/2021   Wt Readings from Last 3 Encounters:  06/25/21 (!) 325 lb (147.4 kg)  05/12/21 (!) 333 lb (151 kg)  05/05/21 (!) 334 lb 8 oz (151.7 kg)    Physical Exam Constitutional:      General: She is not in acute distress.    Appearance: She is not toxic-appearing.  HENT:      Head: Normocephalic and atraumatic.  Pulmonary:     Effort: No respiratory distress.     Comments: Pt is talking in complete sentences without dyspnea  Skin:    Findings: Rash present.     Comments: Pt has 2 round well demarcated eruptions on abdominal wall.  No vesicles or papules.  No cellulitis.  Neurological:     Mental Status: She is alert and oriented to person, place, and time.  Psychiatric:        Behavior: Behavior normal.          Assessment & Plan:   Encounter Diagnoses  Name Primary?   Tinea corporis Yes   Severe persistent asthma dependent on systemic steroids       -Pt counseled to use OTC antifungal cream -rx Flonase to [facility] per pt request -pt to follow up as scheduled.  She is to contact office sooner prn

## 2021-07-03 ENCOUNTER — Telehealth: Payer: Self-pay | Admitting: Pulmonary Disease

## 2021-07-03 MED ORDER — PREDNISONE 10 MG PO TABS: ORAL_TABLET | ORAL | 0 refills | Status: DC

## 2021-07-03 NOTE — Telephone Encounter (Signed)
ATC patient to find preferred pharmacy and let her know of recommendations/how to take the taper. LMTCB with personal extension.

## 2021-07-03 NOTE — Telephone Encounter (Signed)
Patient returned call.   She voiced understanding of recs from Dr. Loanne Drilling and confirmed pharmacy as [facility] [address] in [place].   Pt wanted to know if she should still come to her appt on Monday?   Dr. Loanne Drilling please advise. Thanks!

## 2021-07-03 NOTE — Telephone Encounter (Signed)
Prednisone taper pended. Please verify preferred pharmacy for patient to pick up before ordering. Have her keep her follow-up appointment with me on 07/07/21.

## 2021-07-03 NOTE — Telephone Encounter (Signed)
Yes please have her come to clinic. If symptoms are persistent we will need to address this.

## 2021-07-03 NOTE — Telephone Encounter (Signed)
Spoke with pt who states cough (at times productive) stated 4 days ago. Pt denies wheezing/ SOB/ fever/ chills/ GI upset. Pt states taking Symbicort and Sprivia as directed and using Duonebs as needed but they have not helped with the cough. Pt states she is currently out of Tussionex. Pt is scheulded for OV with Dr. Loanne Drilling on 07/07/21 and is requesting Prednisone until then. Dr. Loanne Drilling could you please advise?

## 2021-07-03 NOTE — Telephone Encounter (Signed)
Called and notified patient. She voiced understanding. Nothing further needed at this time.

## 2021-07-07 ENCOUNTER — Other Ambulatory Visit: Payer: Self-pay

## 2021-07-07 ENCOUNTER — Ambulatory Visit (INDEPENDENT_AMBULATORY_CARE_PROVIDER_SITE_OTHER): Payer: Self-pay | Admitting: Pulmonary Disease

## 2021-07-07 ENCOUNTER — Encounter: Payer: Self-pay | Admitting: Pulmonary Disease

## 2021-07-07 DIAGNOSIS — Z7952 Long term (current) use of systemic steroids: Secondary | ICD-10-CM

## 2021-07-07 DIAGNOSIS — J4551 Severe persistent asthma with (acute) exacerbation: Secondary | ICD-10-CM

## 2021-07-07 MED ORDER — FLUTICASONE-SALMETEROL 500-50 MCG/ACT IN AEPB: 1.0000 | INHALATION_SPRAY | Freq: Two times a day (BID) | RESPIRATORY_TRACT | 5 refills | Status: DC

## 2021-07-07 MED ORDER — IPRATROPIUM-ALBUTEROL 0.5-2.5 (3) MG/3ML IN SOLN: 3.0000 mL | RESPIRATORY_TRACT | 11 refills | Status: DC | PRN
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
## 2021-07-12 ENCOUNTER — Encounter: Payer: Self-pay | Admitting: Pulmonary Disease

## 2021-07-23 ENCOUNTER — Encounter: Payer: Self-pay | Admitting: Pulmonary Disease

## 2021-07-23 ENCOUNTER — Telehealth: Payer: Self-pay | Admitting: Pulmonary Disease

## 2021-07-23 ENCOUNTER — Other Ambulatory Visit: Payer: Self-pay

## 2021-07-23 ENCOUNTER — Ambulatory Visit (INDEPENDENT_AMBULATORY_CARE_PROVIDER_SITE_OTHER): Payer: Self-pay | Admitting: Pulmonary Disease

## 2021-07-23 VITALS — BP 126/84 | HR 80 | Ht 64.5 in | Wt 324.0 lb

## 2021-07-23 DIAGNOSIS — J4551 Severe persistent asthma with (acute) exacerbation: Secondary | ICD-10-CM

## 2021-07-23 MED ORDER — PREDNISONE 10 MG PO TABS: ORAL_TABLET | ORAL | 0 refills | Status: AC

## 2021-07-23 MED ORDER — HYDROCOD POLST-CPM POLST ER 10-8 MG/5ML PO SUER: 5.0000 mL | Freq: Two times a day (BID) | ORAL | 0 refills | Status: DC | PRN

## 2021-07-23 NOTE — Patient Instructions (Signed)
Continue current inhaler and nebulizer treatment.  Start prednisone taper 40mg  x 3 days 30mg  x 3 days 20mg  x 3 days 10mg  x 3 days  Use tussionex cough syrup as needed  Follow up with Dr. Loanne Drilling as planned

## 2021-07-23 NOTE — Progress Notes (Signed)
Synopsis: Referred in December 2022 for acute visit for asthma exacerbation, patient of Dr. Loanne Drilling  Subjective:   PATIENT ID: Linda Ellis GENDER: female DOB: [birth year], MRN: [id number]   HPI  Chief Complaint  Patient presents with   Acute Visit    Asthma flare up that started on Monday. Has a productive cough with tan color phlegm, increased wheezing. Denies any fevers.    Linda Ellis is a 45 year old woman, former smoker with severe persistent asthma who returns to pulmonary clinic for acute visit.   Her current regimen includes symbicort 160-4.27mcg 2 puffs twice daily and as needed albuterol inhaler or duoneb nebulizer treatments. She is also on dupixent injections and singulair daily. She was not able to afford the advair diskus 500-73mcg inhaler recently prescribed.  She was treated with a steroid taper after her visit with Dr. Loanne Drilling on 07/07/21 and was doing better until she had a sick contact at work. She has increased cough, wheezing and watery eyes at this time. She denies fevers, chills or sweats.  Past Medical History:  Diagnosis Date   Asthma 2008   COPD (chronic obstructive pulmonary disease) (HCC)    GERD (gastroesophageal reflux disease)    Multiple gastric ulcers    Osteoporosis    Pneumonia      Family History  Problem Relation Age of Onset   Asthma Mother    COPD Mother    Hypertension Father    Hyperlipidemia Father    Asthma Father    COPD Father    Breast cancer Maternal Grandmother    Diabetes Maternal Grandfather    Heart attack Maternal Grandfather    Stroke Maternal Grandfather    Hypertension Maternal Grandfather      Social History   Socioeconomic History   Marital status: Single    Spouse name: Not on file   Number of children: Not on file   Years of education: Not on file   Highest education level: Not on file  Occupational History   Not on file  Tobacco Use   Smoking status: Former    Packs/day: 1.00    Years: 31.00     Pack years: 31.00    Types: Cigarettes    Start date: 24    Quit date: 02/24/2019    Years since quitting: 2.4   Smokeless tobacco: Never  Vaping Use   Vaping Use: Never used  Substance and Sexual Activity   Alcohol use: Yes    Comment: occasionally   Drug use: No   Sexual activity: Not Currently    Birth control/protection: None  Other Topics Concern   Not on file  Social History Narrative   Not on file   Social Determinants of Health   Financial Resource Strain: Not on file  Food Insecurity: Not on file  Transportation Needs: Not on file  Physical Activity: Not on file  Stress: Not on file  Social Connections: Not on file  Intimate Partner Violence: Not on file     No Known Allergies   Outpatient Medications Prior to Visit  Medication Sig Dispense Refill   albuterol (PROVENTIL HFA) 108 (90 Base) MCG/ACT inhaler INHALE 1 TO 2 PUFFS BY MOUTH EVERY 6 HOURS AS NEEDED FOR COUGHING, WHEEZING, OR SHORTNESS OF BREATH 20.1 g PRN   alendronate (FOSAMAX) 70 MG tablet Take 1 tablet (70 mg total) by mouth once a week. Take with a full glass of water on an empty stomach. 12 tablet 3   BIOTIN  PO Take by mouth.     budesonide-formoterol (SYMBICORT) 160-4.5 MCG/ACT inhaler Inhale 2 puffs into the lungs 2 (two) times daily.     Calcium Carb-Cholecalciferol 500-600 MG-UNIT TABS Take 1 tablet by mouth 2 (two) times daily with a meal. 180 tablet 1   Cholecalciferol (VITAMIN D3) 50 MCG (2000 UT) TABS Take 1 tablet by mouth daily with breakfast.     Dupilumab (DUPIXENT) 300 MG/2ML SOPN 300 mg by Subconjunctival route every 14 (fourteen) days. Loading dose of 600 mg received in clinic on 12/24/20 4 mL 4   famotidine (PEPCID) 20 MG tablet Take 20 mg by mouth 2 (two) times daily.     fluticasone (FLONASE) 50 MCG/ACT nasal spray Place 1 spray into both nostrils 2 (two) times daily. 48 mL 3   ipratropium-albuterol (DUONEB) 0.5-2.5 (3) MG/3ML SOLN Take 3 mLs by nebulization every 4 (four) hours as  needed. 360 mL 11   loratadine (CLARITIN) 10 MG tablet TAKE 1 Tablet BY MOUTH ONCE EVERY DAY AS NEEDED 90 tablet 1   montelukast (SINGULAIR) 10 MG tablet TAKE 1 Tablet BY MOUTH ONCE EVERY NIGHT AT BEDTIME 90 tablet 1   omeprazole (PRILOSEC) 40 MG capsule TAKE 1 Capsule BY MOUTH ONCE EVERY DAY 90 capsule 1   UNKNOWN TO PATIENT OTC Gas medication     chlorpheniramine-HYDROcodone (TUSSIONEX PENNKINETIC ER) 10-8 MG/5ML SUER Take 5 mLs by mouth 2 (two) times daily as needed for cough. 70 mL 0   fluticasone-salmeterol (ADVAIR DISKUS) 500-50 MCG/ACT AEPB Inhale 1 puff into the lungs in the morning and at bedtime. 60 each 5   predniSONE (DELTASONE) 10 MG tablet Take 6 tablets x three days (60 mg), followed by 5 tablets x three days (50mg ), then 4 tablets x three day(40mg ), then 3 tablets (30mg ) x three days, then 2 tablets (20mg ) x three days, then 1 tablet (10mg ) x three days, then STOP 60 tablet 0   No facility-administered medications prior to visit.    Review of Systems  Constitutional:  Negative for chills, fever, malaise/fatigue and weight loss.  HENT:  Negative for congestion, sinus pain and sore throat.   Eyes: Negative.   Respiratory:  Positive for cough, sputum production, shortness of breath and wheezing. Negative for hemoptysis.   Cardiovascular:  Negative for chest pain, palpitations, orthopnea, claudication and leg swelling.  Gastrointestinal:  Negative for abdominal pain, heartburn, nausea and vomiting.  Genitourinary: Negative.   Musculoskeletal:  Negative for joint pain and myalgias.  Skin:  Negative for rash.  Neurological:  Negative for weakness.  Endo/Heme/Allergies:  Positive for environmental allergies.  Psychiatric/Behavioral: Negative.       Objective:   Vitals:   07/23/21 1152  BP: 126/84  Pulse: 80  SpO2: 100%  Weight: (!) 324 lb (147 kg)  Height: 5' 4.5" (1.638 m)     Physical Exam Constitutional:      General: She is not in acute distress.    Appearance:  She is not ill-appearing.  HENT:     Head: Normocephalic and atraumatic.  Eyes:     General: No scleral icterus.    Conjunctiva/sclera: Conjunctivae normal.     Pupils: Pupils are equal, round, and reactive to light.  Cardiovascular:     Rate and Rhythm: Normal rate and regular rhythm.     Pulses: Normal pulses.     Heart sounds: Normal heart sounds. No murmur heard. Pulmonary:     Effort: Pulmonary effort is normal.     Breath sounds: Examination of  the right-upper field reveals wheezing. Wheezing present. No rhonchi or rales.  Abdominal:     General: Bowel sounds are normal.     Palpations: Abdomen is soft.  Musculoskeletal:     Right lower leg: No edema.     Left lower leg: No edema.  Lymphadenopathy:     Cervical: No cervical adenopathy.  Skin:    General: Skin is warm and dry.  Neurological:     General: No focal deficit present.     Mental Status: She is alert.  Psychiatric:        Mood and Affect: Mood normal.        Behavior: Behavior normal.        Thought Content: Thought content normal.        Judgment: Judgment normal.   CBC    Component Value Date/Time   WBC 9.2 02/06/2021 1124   RBC 4.68 02/06/2021 1124   HGB 14.0 02/06/2021 1124   HCT 40.7 02/06/2021 1124   PLT 415.0 (H) 02/06/2021 1124   MCV 87.0 02/06/2021 1124   MCH 29.7 10/17/2018 1234   MCHC 34.4 02/06/2021 1124   RDW 13.8 02/06/2021 1124   LYMPHSABS 3.0 02/06/2021 1124   MONOABS 0.5 02/06/2021 1124   EOSABS 0.3 02/06/2021 1124   BASOSABS 0.3 (H) 02/06/2021 1124   Chest imaging:  PFT: PFT Results Latest Ref Rng & Units 10/09/2019  FVC-Pre L 2.37  FVC-Predicted Pre % 62  FVC-Post L 2.46  FVC-Predicted Post % 64  Pre FEV1/FVC % % 62  Post FEV1/FCV % % 64  FEV1-Pre L 1.48  FEV1-Predicted Pre % 47  FEV1-Post L 1.57  DLCO uncorrected ml/min/mmHg 22.61  DLCO UNC% % 100  DLCO corrected ml/min/mmHg 24.64  DLCO COR %Predicted % 109  DLVA Predicted % 135  TLC L 5.08  TLC % Predicted % 97   RV % Predicted % 170    Labs:  Path:  Echo:  Heart Catheterization:   Assessment & Plan:   Severe persistent asthma with acute exacerbation - Plan: predniSONE (DELTASONE) 10 MG tablet, chlorpheniramine-HYDROcodone (TUSSIONEX PENNKINETIC ER) 10-8 MG/5ML SUER  Discussion: Linda Ellis is a 45 year old woman, former smoker with severe persistent asthma who returns to pulmonary clinic for acute visit.   She is having exacerbation in setting of upper respiratory viral infection. We will provide her with steroid taper starting at 40mg  daily for 3 days, then tapering down 10mg  every 3 days there after.   She is to continue her current inhaler regimen of symbicort 160-4.75mcg 2 puffs twice daily with as needed duonebs.  Follow as scheduled with Dr. Loanne Drilling.  Freda Jackson, MD [facility] Office: [phone]    Current Outpatient Medications:    albuterol (PROVENTIL HFA) 108 (90 Base) MCG/ACT inhaler, INHALE 1 TO 2 PUFFS BY MOUTH EVERY 6 HOURS AS NEEDED FOR COUGHING, WHEEZING, OR SHORTNESS OF BREATH, Disp: 20.1 g, Rfl: PRN   alendronate (FOSAMAX) 70 MG tablet, Take 1 tablet (70 mg total) by mouth once a week. Take with a full glass of water on an empty stomach., Disp: 12 tablet, Rfl: 3   BIOTIN PO, Take by mouth., Disp: , Rfl:    budesonide-formoterol (SYMBICORT) 160-4.5 MCG/ACT inhaler, Inhale 2 puffs into the lungs 2 (two) times daily., Disp: , Rfl:    Calcium Carb-Cholecalciferol 500-600 MG-UNIT TABS, Take 1 tablet by mouth 2 (two) times daily with a meal., Disp: 180 tablet, Rfl: 1   chlorpheniramine-HYDROcodone (TUSSIONEX PENNKINETIC ER)  10-8 MG/5ML SUER, Take 5 mLs by mouth 2 (two) times daily as needed for cough., Disp: 70 mL, Rfl: 0   Cholecalciferol (VITAMIN D3) 50 MCG (2000 UT) TABS, Take 1 tablet by mouth daily with breakfast., Disp: , Rfl:    Dupilumab (DUPIXENT) 300 MG/2ML SOPN, 300 mg by Subconjunctival route every 14 (fourteen) days. Loading dose  of 600 mg received in clinic on 12/24/20, Disp: 4 mL, Rfl: 4   famotidine (PEPCID) 20 MG tablet, Take 20 mg by mouth 2 (two) times daily., Disp: , Rfl:    fluticasone (FLONASE) 50 MCG/ACT nasal spray, Place 1 spray into both nostrils 2 (two) times daily., Disp: 48 mL, Rfl: 3   ipratropium-albuterol (DUONEB) 0.5-2.5 (3) MG/3ML SOLN, Take 3 mLs by nebulization every 4 (four) hours as needed., Disp: 360 mL, Rfl: 11   loratadine (CLARITIN) 10 MG tablet, TAKE 1 Tablet BY MOUTH ONCE EVERY DAY AS NEEDED, Disp: 90 tablet, Rfl: 1   montelukast (SINGULAIR) 10 MG tablet, TAKE 1 Tablet BY MOUTH ONCE EVERY NIGHT AT BEDTIME, Disp: 90 tablet, Rfl: 1   omeprazole (PRILOSEC) 40 MG capsule, TAKE 1 Capsule BY MOUTH ONCE EVERY DAY, Disp: 90 capsule, Rfl: 1   predniSONE (DELTASONE) 10 MG tablet, Take 4 tablets (40 mg total) by mouth daily with breakfast for 3 days, THEN 3 tablets (30 mg total) daily with breakfast for 3 days, THEN 2 tablets (20 mg total) daily with breakfast for 3 days, THEN 1 tablet (10 mg total) daily with breakfast for 3 days., Disp: 30 tablet, Rfl: 0   UNKNOWN TO PATIENT, OTC Gas medication, Disp: , Rfl:

## 2021-07-23 NOTE — Telephone Encounter (Signed)
Pt calling to see if she can get a note for work since she has been sick(doesn't want to lose her new job) Pt would also like to see if JE will send in a rx for cough medicine-Tussinex. Pt stayed up all night coughing. Pt states she just needs note for 12/21 and 12/22. Please advise. [phone]

## 2021-07-23 NOTE — Telephone Encounter (Signed)
Called and spoke with pt who stated she started feeling bad 3 days ago. States that she has been coughing, wheezing, and also has congestion in her head.  Pt feels like she might have a sinus infection.  States that she will occ cough up clear phlegm that does have some yellow tint to it. Pt said her cough has been keeping her up at night.  Pt stated that she does not really want to have prednisone prescribed but is requesting to have Tussionex prescribed to help with her cough. Pt also is requesting to have a note for work.  Stated to pt that we need her to come in to the office especially since she is requesting a note for work and that way we could get all taken care of during that appt. Pt verbalized understanding. Appt scheduled for pt today 12/21 with JD at 12pm. Nothing further needed.

## 2021-08-20 ENCOUNTER — Encounter: Payer: Self-pay | Admitting: Physician Assistant

## 2021-08-20 ENCOUNTER — Ambulatory Visit: Payer: Medicaid Other | Admitting: Physician Assistant

## 2021-08-20 ENCOUNTER — Other Ambulatory Visit: Payer: Self-pay

## 2021-08-20 VITALS — BP 111/76 | HR 93 | Temp 97.2°F | Wt 317.0 lb

## 2021-08-20 DIAGNOSIS — M25561 Pain in right knee: Secondary | ICD-10-CM

## 2021-08-20 DIAGNOSIS — S83421S Sprain of lateral collateral ligament of right knee, sequela: Secondary | ICD-10-CM

## 2021-08-20 MED ORDER — BUTENAFINE HCL 1 % EX CREA: TOPICAL_CREAM | CUTANEOUS | 0 refills | Status: DC

## 2021-08-20 NOTE — Progress Notes (Signed)
en  BP 111/76    Pulse 93    Temp (!) 97.2 F (36.2 C)    Wt (!) 317 lb (143.8 kg)    SpO2 98%    BMI 53.57 kg/m    Subjective:    Patient ID: Linda Ellis, female    DOB: June 08, 1976, 46 y.o.   MRN: [id number]  HPI: Linda Ellis is a 46 y.o. female presenting on 08/20/2021 for Leg Pain (Right leg pain for the last 3 days.)   HPI   Chief Complaint  Patient presents with   Leg Pain    Right leg pain for the last 3 days.     She Noticed right knee pain when she got out of bed 3 day ago It Popped this morning making a loud noise and pain increased. She works at [employer] in [place] seen by orthopedics for the right knee last year for tear of collateral ligament    Relevant past medical, surgical, family and social history reviewed and updated as indicated. Interim medical history since our last visit reviewed. Allergies and medications reviewed and updated.   Current Outpatient Medications:    albuterol (PROVENTIL HFA) 108 (90 Base) MCG/ACT inhaler, INHALE 1 TO 2 PUFFS BY MOUTH EVERY 6 HOURS AS NEEDED FOR COUGHING, WHEEZING, OR SHORTNESS OF BREATH, Disp: 20.1 g, Rfl: PRN   alendronate (FOSAMAX) 70 MG tablet, Take 1 tablet (70 mg total) by mouth once a week. Take with a full glass of water on an empty stomach., Disp: 12 tablet, Rfl: 3   BIOTIN PO, Take by mouth., Disp: , Rfl:    budesonide-formoterol (SYMBICORT) 160-4.5 MCG/ACT inhaler, Inhale 2 puffs into the lungs 2 (two) times daily., Disp: , Rfl:    Calcium Carb-Cholecalciferol 500-600 MG-UNIT TABS, Take 1 tablet by mouth 2 (two) times daily with a meal., Disp: 180 tablet, Rfl: 1   Cholecalciferol (VITAMIN D3) 50 MCG (2000 UT) TABS, Take 1 tablet by mouth daily with breakfast., Disp: , Rfl:    Dupilumab (DUPIXENT) 300 MG/2ML SOPN, 300 mg by Subconjunctival route every 14 (fourteen) days. Loading dose of 600 mg received in clinic on 12/24/20, Disp: 4 mL, Rfl: 4   famotidine (PEPCID) 20 MG tablet, Take 20 mg by mouth 2  (two) times daily., Disp: , Rfl:    fluticasone (FLONASE) 50 MCG/ACT nasal spray, Place 1 spray into both nostrils 2 (two) times daily., Disp: 48 mL, Rfl: 3   ipratropium-albuterol (DUONEB) 0.5-2.5 (3) MG/3ML SOLN, Take 3 mLs by nebulization every 4 (four) hours as needed., Disp: 360 mL, Rfl: 11   loratadine (CLARITIN) 10 MG tablet, TAKE 1 Tablet BY MOUTH ONCE EVERY DAY AS NEEDED, Disp: 90 tablet, Rfl: 1   montelukast (SINGULAIR) 10 MG tablet, TAKE 1 Tablet BY MOUTH ONCE EVERY NIGHT AT BEDTIME, Disp: 90 tablet, Rfl: 1   omeprazole (PRILOSEC) 40 MG capsule, TAKE 1 Capsule BY MOUTH ONCE EVERY DAY, Disp: 90 capsule, Rfl: 1   UNKNOWN TO PATIENT, OTC Gas medication, Disp: , Rfl:     Review of Systems  Per HPI unless specifically indicated above     Objective:    BP 111/76    Pulse 93    Temp (!) 97.2 F (36.2 C)    Wt (!) 317 lb (143.8 kg)    SpO2 98%    BMI 53.57 kg/m   Wt Readings from Last 3 Encounters:  08/20/21 (!) 317 lb (143.8 kg)  07/23/21 (!) 324 lb (147 kg)  07/07/21 (!) 322 lb 9.6 oz (146.3 kg)    Physical Exam Constitutional:      General: She is not in acute distress.    Appearance: She is obese. She is not toxic-appearing.  HENT:     Head: Normocephalic and atraumatic.  Pulmonary:     Effort: No respiratory distress.  Musculoskeletal:     Right knee: No ecchymosis. Normal range of motion. Tenderness present.     Comments: Knee exam limited by habitus  Neurological:     Mental Status: She is alert and oriented to person, place, and time.  Psychiatric:        Attention and Perception: Attention normal.        Speech: Speech normal.        Behavior: Behavior normal. Behavior is cooperative.          Assessment & Plan:    Encounter Diagnoses  Name Primary?   Right knee pain, unspecified chronicity Yes   Tear of lateral collateral ligament of right knee, sequela      -Refer back to ortho for right knee -She says her cafa is UTD -gave note to be out of  work -she is to use ice, elevate, OTC analgesics prn, ace wrap if desired -pt to follow up here as scheduled.  She is to contact office sooner prn

## 2021-08-25 ENCOUNTER — Other Ambulatory Visit: Payer: Self-pay | Admitting: Physician Assistant

## 2021-09-03 ENCOUNTER — Ambulatory Visit: Payer: Medicaid Other | Admitting: Orthopedic Surgery

## 2021-09-03 ENCOUNTER — Encounter: Payer: Self-pay | Admitting: Orthopedic Surgery

## 2021-09-26 ENCOUNTER — Telehealth (INDEPENDENT_AMBULATORY_CARE_PROVIDER_SITE_OTHER): Payer: Self-pay | Admitting: Student

## 2021-09-26 DIAGNOSIS — J4551 Severe persistent asthma with (acute) exacerbation: Secondary | ICD-10-CM

## 2021-09-26 MED ORDER — PREDNISONE 10 MG PO TABS: ORAL_TABLET | ORAL | 0 refills | Status: DC

## 2021-09-26 MED ORDER — BREZTRI AEROSPHERE 160-9-4.8 MCG/ACT IN AERO: 2.0000 | INHALATION_SPRAY | Freq: Two times a day (BID) | RESPIRATORY_TRACT | 0 refills | Status: DC

## 2021-09-26 MED ORDER — HYDROCOD POLI-CHLORPHE POLI ER 10-8 MG/5ML PO SUER: 5.0000 mL | Freq: Two times a day (BID) | ORAL | 0 refills | Status: DC | PRN

## 2021-09-26 NOTE — Progress Notes (Addendum)
Synopsis: Referred in December 2022 for acute visit for asthma exacerbation, patient of Dr. Loanne Drilling  Virtual Visit via Video Note   I connected with Linda Ellis today by a video enabled telemedicine application and verified that I am speaking with the correct person using two identifiers.   Location: Patient: Home Provider: Office   I discussed the limitations of evaluation and management by telemedicine and the availability of in person appointments. The patient expressed understanding and agreed to proceed.  Subjective:   PATIENT ID: Linda Ellis GENDER: female DOB: October 31, 1975, MRN: [id number]  Chief Complaint  Patient presents with   Acute Visit    Pt c/o increased cough and SOB for the past week, worse over the past 2 days. Cough is mainly non prod except does produce minimal tan mucus after she uses her neb. She also c/o wheezing and chest tightness. She is using her neb 6-7 x per day.    Linda Ellis is a 46 year old woman, former smoker with severe persistent asthma who returns to pulmonary clinic for acute visit. This was conducted as a video visit.  Sticking with symbicort 2 puffs BID to TID rinsing mouth after use, claritin, singulair. No issues with dupixent injections. Feels like she's developing asthma exacerbation with increased dyspnea, wheeze, and cough productive of tannish sputum following her breathing treatments   Otherwise pertinent review of systems is negative  Past Medical History:  Diagnosis Date   Asthma 2008   COPD (chronic obstructive pulmonary disease) (HCC)    GERD (gastroesophageal reflux disease)    Multiple gastric ulcers    Osteoporosis    Pneumonia      Family History  Problem Relation Age of Onset   Asthma Mother    COPD Mother    Hypertension Father    Hyperlipidemia Father    Asthma Father    COPD Father    Breast cancer Maternal Grandmother    Diabetes Maternal Grandfather    Heart attack Maternal Grandfather    Stroke Maternal  Grandfather    Hypertension Maternal Grandfather      Social History   Socioeconomic History   Marital status: Single    Spouse name: Not on file   Number of children: Not on file   Years of education: Not on file   Highest education level: Not on file  Occupational History   Not on file  Tobacco Use   Smoking status: Former    Packs/day: 1.00    Years: 31.00    Pack years: 31.00    Types: Cigarettes    Start date: 93    Quit date: 02/24/2019    Years since quitting: 2.5   Smokeless tobacco: Never  Vaping Use   Vaping Use: Never used  Substance and Sexual Activity   Alcohol use: Yes    Comment: occasionally   Drug use: No   Sexual activity: Not Currently    Birth control/protection: None  Other Topics Concern   Not on file  Social History Narrative   Not on file   Social Determinants of Health   Financial Resource Strain: Not on file  Food Insecurity: Not on file  Transportation Needs: Not on file  Physical Activity: Not on file  Stress: Not on file  Social Connections: Not on file  Intimate Partner Violence: Not on file     No Known Allergies   Outpatient Medications Prior to Visit  Medication Sig Dispense Refill   albuterol (PROVENTIL HFA) 108 (90  Base) MCG/ACT inhaler INHALE 1 TO 2 PUFFS BY MOUTH EVERY 6 HOURS AS NEEDED FOR COUGHING, WHEEZING, OR SHORTNESS OF BREATH 20.1 g PRN   alendronate (FOSAMAX) 70 MG tablet Take 1 tablet (70 mg total) by mouth once a week. Take with a full glass of water on an empty stomach. 12 tablet 3   budesonide-formoterol (SYMBICORT) 160-4.5 MCG/ACT inhaler Inhale 2 puffs into the lungs 2 (two) times daily.     Calcium Carb-Cholecalciferol 500-600 MG-UNIT TABS Take 1 tablet by mouth 2 (two) times daily with a meal. 180 tablet 1   Cholecalciferol (VITAMIN D3) 50 MCG (2000 UT) TABS Take 1 tablet by mouth daily with breakfast.     Dupilumab (DUPIXENT) 300 MG/2ML SOPN 300 mg by Subconjunctival route every 14 (fourteen) days. Loading  dose of 600 mg received in clinic on 12/24/20 4 mL 4   famotidine (PEPCID) 20 MG tablet Take 20 mg by mouth 2 (two) times daily.     fluticasone (FLONASE) 50 MCG/ACT nasal spray Place 1 spray into both nostrils 2 (two) times daily. 48 mL 3   ipratropium-albuterol (DUONEB) 0.5-2.5 (3) MG/3ML SOLN INHALE 1 VIAL VIA NEBULIZER EVERY 6 HOURS AS NEEDED 360 mL 1   loratadine (CLARITIN) 10 MG tablet TAKE 1 Tablet BY MOUTH ONCE EVERY DAY AS NEEDED 90 tablet 1   montelukast (SINGULAIR) 10 MG tablet TAKE 1 Tablet BY MOUTH ONCE EVERY NIGHT AT BEDTIME 90 tablet 1   omeprazole (PRILOSEC) 40 MG capsule TAKE 1 Capsule BY MOUTH ONCE EVERY DAY 90 capsule 1   UNKNOWN TO PATIENT OTC Gas medication     BIOTIN PO Take by mouth.     Butenafine HCl (MENTAX) 1 % cream Apply thin film bid 30 g 0   No facility-administered medications prior to visit.       Objective:    General appearance: 46 y.o., female, NAD, conversant  Eyes: tracking appropriately HENT: NCAT; MMM Neck: Trachea midline; no lymphadenopathy, no JVD Resp: clear voice, normal appearing effort Psych: Appropriate affect Neuro: Grossly no focal deficit, attentive  CBC    Component Value Date/Time   WBC 9.2 02/06/2021 1124   RBC 4.68 02/06/2021 1124   HGB 14.0 02/06/2021 1124   HCT 40.7 02/06/2021 1124   PLT 415.0 (H) 02/06/2021 1124   MCV 87.0 02/06/2021 1124   MCH 29.7 10/17/2018 1234   MCHC 34.4 02/06/2021 1124   RDW 13.8 02/06/2021 1124   LYMPHSABS 3.0 02/06/2021 1124   MONOABS 0.5 02/06/2021 1124   EOSABS 0.3 02/06/2021 1124   BASOSABS 0.3 (H) 02/06/2021 1124   Eos 300-500  Chest imaging: None new  PFT: PFT Results Latest Ref Rng & Units 10/09/2019  FVC-Pre L 2.37  FVC-Predicted Pre % 62  FVC-Post L 2.46  FVC-Predicted Post % 64  Pre FEV1/FVC % % 62  Post FEV1/FCV % % 64  FEV1-Pre L 1.48  FEV1-Predicted Pre % 47  FEV1-Post L 1.57  DLCO uncorrected ml/min/mmHg 22.61  DLCO UNC% % 100  DLCO corrected ml/min/mmHg 24.64   DLCO COR %Predicted % 109  DLVA Predicted % 135  TLC L 5.08  TLC % Predicted % 97  RV % Predicted % 170  Moderately severe obstruction, no BD effect, air trapping     Assessment & Plan:   # Severe persistent eosinophilic asthma with exacerbation In setting active seasonal allergy. - prednisone taper - dupixent per Dr. Loanne Drilling - trial breztri 2 puffs BID, 2 samples given - flutter valve 10 slow but  firm puffs twice daily after each breztri treatment until cough clears up - continue claritin, singulair - follow up with Dr. Loanne Drilling before breztri samples run out  RTC 6 weeks with Dr. Christ Kick Pul[facility]  Office: [phone]

## 2021-09-26 NOTE — Patient Instructions (Addendum)
-   stay on symbicort until you pick up new breztri inhaler samples. Start breztri 2 puffs twice daily, rinse mouth after use.  - after each breztri treatment start using flutter valve 10 slow but firm puffs twice daily until cough clears up - prednisone taper sent to your pharmacy - stay on singulair, claritin - albuterol as needed  - Dr. Loanne Drilling appointment in 6 weeks

## 2021-10-10 ENCOUNTER — Telehealth: Payer: Self-pay | Admitting: Pulmonary Disease

## 2021-10-10 ENCOUNTER — Other Ambulatory Visit: Payer: Self-pay | Admitting: Pulmonary Disease

## 2021-10-10 DIAGNOSIS — Z7952 Long term (current) use of systemic steroids: Secondary | ICD-10-CM

## 2021-10-10 DIAGNOSIS — J4551 Severe persistent asthma with (acute) exacerbation: Secondary | ICD-10-CM

## 2021-10-10 MED ORDER — ALBUTEROL SULFATE HFA 108 (90 BASE) MCG/ACT IN AERS: INHALATION_SPRAY | RESPIRATORY_TRACT | 4 refills | Status: DC

## 2021-10-10 NOTE — Telephone Encounter (Signed)
Called patient. She stated that she needs a refill on her albuterol inhaler. She is completely out of her medication. I advised her that I would send in the refill for her. She verbalized understanding.  ? ?Nothing further needed at time of call.  ?

## 2021-10-20 ENCOUNTER — Other Ambulatory Visit: Payer: Self-pay | Admitting: Pharmacist

## 2021-10-20 ENCOUNTER — Other Ambulatory Visit: Payer: Self-pay | Admitting: Physician Assistant

## 2021-10-20 DIAGNOSIS — J4551 Severe persistent asthma with (acute) exacerbation: Secondary | ICD-10-CM

## 2021-10-20 MED ORDER — DUPIXENT 300 MG/2ML ~~LOC~~ SOAJ: 300.0000 mg | SUBCUTANEOUS | 0 refills | Status: DC

## 2021-10-20 NOTE — Telephone Encounter (Signed)
Refill sent for [place] to [facility]: [phone] ? ?Dose: '300mg'$  SQ every 2 weeks ? ?Last OV: Video visit on 09/26/21 with Dr. Verlee Monte and OV on 07/23/21 with Dr. Erin Fulling ?Provider: Dr. Loanne Drilling ? ?Next OV: 11/24/21 with Dr. Loanne Drilling. Rx sent for 3 months only to ensure patient is sen in clinic ? ?Knox Saliva, PharmD, MPH, BCPS ?Clinical Pharmacist (Rheumatology and Pulmonology) ? ?

## 2021-10-23 ENCOUNTER — Other Ambulatory Visit: Payer: Self-pay | Admitting: *Deleted

## 2021-10-23 ENCOUNTER — Ambulatory Visit: Payer: Medicaid Other | Admitting: Physician Assistant

## 2021-10-23 ENCOUNTER — Encounter: Payer: Self-pay | Admitting: Physician Assistant

## 2021-10-23 DIAGNOSIS — J4550 Severe persistent asthma, uncomplicated: Secondary | ICD-10-CM

## 2021-10-23 DIAGNOSIS — U071 COVID-19: Secondary | ICD-10-CM

## 2021-10-23 DIAGNOSIS — J4551 Severe persistent asthma with (acute) exacerbation: Secondary | ICD-10-CM

## 2021-10-23 MED ORDER — MONTELUKAST SODIUM 10 MG PO TABS: ORAL_TABLET | ORAL | 3 refills | Status: DC

## 2021-10-23 MED ORDER — NIRMATRELVIR/RITONAVIR (PAXLOVID)TABLET: 3.0000 | ORAL_TABLET | Freq: Two times a day (BID) | ORAL | 0 refills | Status: AC

## 2021-10-23 NOTE — Progress Notes (Signed)
? ?  There were no vitals taken for this visit.  ? ?Subjective:  ? ? Patient ID: Linda Ellis, female    DOB: 11/24/1975, 46 y.o.   MRN: [id number] ? ?HPI: ?Linda Ellis is a 46 y.o. female presenting on 10/23/2021 for No chief complaint on file. ? ? ?HPI ? ? ?This is a telemedicine appointment via telephone. ? ?I connected with  [name] on 10/23/21 by a video enabled telemedicine application and verified that I am speaking with the correct person using two identifiers. ?  ?I discussed the limitations of evaluation and management by telemedicine. The patient expressed understanding and agreed to proceed. ? ?Pt is at home.  Provider is at office. ? ? ? ?Pt is 22yoF with severe asthma.  She sees pulmonologist regularly.  She says she has been trying to call there yesterday and today but couldn't get through.   ? ?Pt says she is Feeling bad.  She recently Started a new job at the [employer].  She Works in the cold.  She took 2 covid tests yesterday that were both postitive.   She has been Sick since Tuesday.  She says she Just doesn't feel right.  Her breathing isn't good.   She had covid before but says that last time she didn't think it affected her breathing and she thinks that this time is different.   ? ?She has nebulizers and has one ready to use when she gets off the phone.  ? ? ? ? ? ?Relevant past medical, surgical, family and social history reviewed and updated as indicated. Interim medical history since our last visit reviewed. ?Allergies and medications reviewed and updated. ? ?Review of Systems ? ?Per HPI unless specifically indicated above ? ?   ?Objective:  ?  ?There were no vitals taken for this visit.  ?Wt Readings from Last 3 Encounters:  ?08/20/21 (!) 317 lb (143.8 kg)  ?07/23/21 (!) 324 lb (147 kg)  ?07/07/21 (!) 322 lb 9.6 oz (146.3 kg)  ?  ?Physical Exam ?Pulmonary:  ?   Effort: No respiratory distress.  ?   Comments: Pt is talking in complete sentences without dyspnea.  No  wheezing audible.  ?Neurological:  ?   Mental Status: She is alert and oriented to person, place, and time.  ?Psychiatric:     ?   Thought Content: Thought content normal.  ? ? ? ? ? ?   ?Assessment & Plan:  ? ?Encounter Diagnoses  ?Name Primary?  ? COVID-19 Yes  ? Severe persistent asthma, unspecified whether complicated   ? ? ? ?-Rx paxlovid.  She has nebulizers to use as needed.  She is to self-isolate.  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   ?-discussed with pt that she needs to go to ER if she starts having trouble with her breathing.  She agrees ? ? ?

## 2021-10-24 ENCOUNTER — Other Ambulatory Visit: Payer: Self-pay | Admitting: Pulmonary Disease

## 2021-10-24 MED ORDER — PREDNISONE 10 MG PO TABS: ORAL_TABLET | ORAL | 0 refills | Status: DC

## 2021-10-24 NOTE — Telephone Encounter (Signed)
Called and spoke with patient. She stated that she tested positive for COVID yesterday. She had a video visit with her PCP yesterday and she was prescribed Paxlovid yesterday. She has been struggling with increased SOB and wheezing. She has been using her Duonebs every 6 hours. She is UTD on her Dupixent injections. Denied any fevers or sore throat.  ? ?She wanted to know if she could be prescribed a prednisone taper to see if it would help with her increased SOB. I did attempt to get her scheduled for a video visit but she wanted recommendations first.  ? ?Pharmacy is [facility] in [place].  ? ?Dr. Loanne Drilling, can you please advise? Thanks!  ? ? ? ?

## 2021-10-24 NOTE — Telephone Encounter (Signed)
Prednisone order pended. ?Please verify which pharmacy for order. ?

## 2021-10-24 NOTE — Telephone Encounter (Signed)
Called and spoke with patient. She confirmed that she wants to use the [facility] in [place] has been sent and patient is aware of directions.  ? ?Nothing further needed at time of call.  ?

## 2021-11-03 ENCOUNTER — Ambulatory Visit: Payer: Medicaid Other | Admitting: Physician Assistant

## 2021-11-12 ENCOUNTER — Ambulatory Visit: Payer: Medicaid Other | Admitting: Physician Assistant

## 2021-11-12 ENCOUNTER — Encounter: Payer: Self-pay | Admitting: Physician Assistant

## 2021-11-12 VITALS — BP 130/87 | HR 84 | Temp 97.9°F | Wt 317.0 lb

## 2021-11-12 DIAGNOSIS — J45909 Unspecified asthma, uncomplicated: Secondary | ICD-10-CM

## 2021-11-12 DIAGNOSIS — M7712 Lateral epicondylitis, left elbow: Secondary | ICD-10-CM

## 2021-11-12 NOTE — Progress Notes (Signed)
? ?BP 130/87   Pulse 84   Temp 97.9 ?F (36.6 ?C)   Wt (!) 317 lb (143.8 kg)   SpO2 97%   BMI 53.57 kg/m?   ? ?Subjective:  ? ? Patient ID: Linda Ellis, female    DOB: Nov 06, 1975, 46 y.o.   MRN: [id number] ? ?HPI: ?Linda Ellis is a 46 y.o. female presenting on 11/12/2021 for No chief complaint on file. ? ? ?HPI ? ? ?Pt is 41yoF who presents for routine follow up.   ? ?She is still working at [employer] in [place]. ? ?She thinks she will be getting insurance end of may ? ?She has some left forearm pain for about 2 month. ? ?She is left hand dominant. ? ?Pt continues to see pulmonologist for her asthma.   ? ? ? ? ?Relevant past medical, surgical, family and social history reviewed and updated as indicated. Interim medical history since our last visit reviewed. ?Allergies and medications reviewed and updated. ? ? ?Current Outpatient Medications:  ?  albuterol (PROVENTIL HFA) 108 (90 Base) MCG/ACT inhaler, INHALE 1 TO 2 PUFFS BY MOUTH EVERY 6 HOURS AS NEEDED FOR COUGHING, WHEEZING, OR SHORTNESS OF BREATH, Disp: 8 g, Rfl: 4 ?  alendronate (FOSAMAX) 70 MG tablet, Take 1 tablet (70 mg total) by mouth once a week. Take with a full glass of water on an empty stomach., Disp: 12 tablet, Rfl: 3 ?  Calcium Carb-Cholecalciferol 500-600 MG-UNIT TABS, Take 1 tablet by mouth 2 (two) times daily with a meal., Disp: 180 tablet, Rfl: 1 ?  Cholecalciferol (VITAMIN D3) 50 MCG (2000 UT) TABS, Take 1 tablet by mouth daily with breakfast., Disp: , Rfl:  ?  Dupilumab (DUPIXENT) 300 MG/2ML SOPN, 300 mg by Subconjunctival route every 14 (fourteen) days., Disp: 12 mL, Rfl: 0 ?  famotidine (PEPCID) 20 MG tablet, Take 20 mg by mouth 2 (two) times daily., Disp: , Rfl:  ?  fluticasone (FLONASE) 50 MCG/ACT nasal spray, Place 1 spray into both nostrils 2 (two) times daily., Disp: 48 mL, Rfl: 3 ?  ipratropium-albuterol (DUONEB) 0.5-2.5 (3) MG/3ML SOLN, INHALE 1 VIAL VIA NEBULIZER EVERY 6 HOURS AS NEEDED, Disp: 360 mL, Rfl: 1 ?   loratadine (CLARITIN) 10 MG tablet, TAKE 1 Tablet BY MOUTH ONCE EVERY DAY AS NEEDED, Disp: 90 tablet, Rfl: 1 ?  montelukast (SINGULAIR) 10 MG tablet, TAKE 1 Tablet BY MOUTH ONCE EVERY NIGHT AT BEDTIME, Disp: 90 tablet, Rfl: 3 ?  omeprazole (PRILOSEC) 40 MG capsule, TAKE 1 Capsule BY MOUTH ONCE EVERY DAY, Disp: 90 capsule, Rfl: 1 ?  Simethicone (GAS-X PO), Take by mouth., Disp: , Rfl:  ?  Budeson-Glycopyrrol-Formoterol (BREZTRI AEROSPHERE) 160-9-4.8 MCG/ACT AERO, Inhale 2 puffs into the lungs in the morning and at bedtime. (Patient not taking: Reported on 11/12/2021), Disp: 5.9 g, Rfl: 0 ?  budesonide-formoterol (SYMBICORT) 160-4.5 MCG/ACT inhaler, Inhale 2 puffs into the lungs 2 (two) times daily. (Patient not taking: Reported on 10/23/2021), Disp: , Rfl:  ?  predniSONE (DELTASONE) 10 MG tablet, Take 6 tablets x three days (60 mg), followed by 5 tablets x three days ('50mg'$ ), then 4 tablets x three day('40mg'$ ), then 3 tablets ('30mg'$ ) x three days, then 2 tablets ('20mg'$ ) x three days, then 1 tablet ('10mg'$ ) x three days, then STOP (Patient not taking: Reported on 11/12/2021), Disp: 60 tablet, Rfl: 0 ? ? ? ? ?Review of Systems ? ?Per HPI unless specifically indicated above ? ?   ?Objective:  ?  ?BP 130/87  Pulse 84   Temp 97.9 ?F (36.6 ?C)   Wt (!) 317 lb (143.8 kg)   SpO2 97%   BMI 53.57 kg/m?   ?Wt Readings from Last 3 Encounters:  ?11/12/21 (!) 317 lb (143.8 kg)  ?08/20/21 (!) 317 lb (143.8 kg)  ?07/23/21 (!) 324 lb (147 kg)  ?  ?Physical Exam ?Vitals reviewed.  ?Constitutional:   ?   Appearance: She is well-developed.  ?HENT:  ?   Head: Normocephalic and atraumatic.  ?Cardiovascular:  ?   Rate and Rhythm: Normal rate and regular rhythm.  ?Pulmonary:  ?   Effort: Pulmonary effort is normal.  ?   Breath sounds: Normal breath sounds.  ?Abdominal:  ?   General: Bowel sounds are normal.  ?   Palpations: Abdomen is soft. There is no mass.  ?   Tenderness: There is no abdominal tenderness.  ?Musculoskeletal:  ?   Left elbow:  Normal range of motion. Tenderness present in lateral epicondyle. No medial epicondyle tenderness.  ?   Left forearm: Normal.  ?   Cervical back: Neck supple.  ?Lymphadenopathy:  ?   Cervical: No cervical adenopathy.  ?Skin: ?   General: Skin is warm and dry.  ?Neurological:  ?   Mental Status: She is alert and oriented to person, place, and time.  ?Psychiatric:     ?   Behavior: Behavior normal.  ? ? ? ? ? ?   ?Assessment & Plan:  ? ?Encounter Diagnoses  ?Name Primary?  ? Epicondylitis, lateral, left Yes  ? Asthma, unspecified asthma severity, unspecified whether complicated, unspecified whether persistent   ? Morbid obesity ([place])   ? ? ? ?-Pt to update pap / call her gyn (she has FP mediaid) ?-encouraged Covid booster ?-discussed Lateral epicondylitis and encouraged pt to apply ice 10-20 minutes 4 times daily for next week or two.  Discussed activities to avoid that exacerbate it like playing games on cell phone. ?-her Insurance starts next month so no follow up appointment is scheduled.  She is to contact office if that falls through ? ?

## 2021-11-12 NOTE — Patient Instructions (Addendum)
Tennis Elbow ?Tennis elbow (lateral epicondylitis) is inflammation of tendons in your outer forearm, near your elbow. Tendons are tissues that connect muscle to bone. When you have tennis elbow, inflammation affects the tendons that you use to bend your wrist and move your hand up. Inflammation occurs in the lower part of the upper arm bone (humerus), where the tendons connect to the bone (lateral epicondyle). ?Tennis elbow often affects people who play tennis, but anyone may get the condition from repeatedly extending the wrist or turning the forearm. ?What are the causes? ?This condition is usually caused by repeatedly extending the wrist, turning the forearm, and using the hands. It can result from sports or work that requires repetitive forearm movements. In some cases, it may be caused by a sudden injury. ?What increases the risk? ?You are more likely to develop tennis elbow if you play tennis or another racket sport. You also have a higher risk if you frequently use your hands for work. Besides people who play tennis, others at greater risk include: ?People who use computers. ?Architect workers. ?People who work in [employer]. ?Musicians. ?Cooks. ?Cashiers. ?What are the signs or symptoms? ?Symptoms of this condition include: ?Pain and tenderness in the forearm and the outer part of the elbow. Pain may be felt only when using the arm, or it may be there all the time. ?A burning feeling that starts in the elbow and spreads down the forearm. ?A weak grip in the hand. ?How is this diagnosed? ?This condition is diagnosed based on your symptoms, your medical history, and a physical exam. ?You may also have X-rays or an MRI to: ?Confirm the diagnosis. ?Look for other issues. ?Check for tears in the ligaments, muscles, or tendons. ?How is this treated? ?Resting and icing your arm is often the first treatment. Your health care provider may also recommend: ?Medicines to reduce pain and inflammation. These may be in  the form of a pill, topical gels, or shots of a steroid medicine (cortisone). ?An elbow strap to reduce stress on the area. ?Physical therapy. This may include massage or exercises or both. ?An elbow brace to restrict the movements that cause symptoms. ?If these treatments do not help relieve your symptoms, your health care provider may recommend surgery to remove damaged muscle and reattach healthy muscle to bone. ?Follow these instructions at home: ?If you have a brace or strap: ?Wear the brace or strap as told by your health care provider. Remove it only as told by your health care provider. ?Check the skin around the brace or strap every day. Tell your health care provider about any concerns. ?Loosen the brace if your fingers tingle, become numb, or turn cold and blue. ?Keep the brace clean. ?If the brace or strap is not waterproof: ?Do not let it get wet. ?Cover it with a watertight covering when you take a bath or a shower. ?Managing pain, stiffness, and swelling ? ?If directed, put ice on the injured area. To do this: ?If you have a removable brace or strap, remove it as told by your health care provider. ?Put ice in a plastic bag. ?Place a towel between your skin and the bag. ?Leave the ice on for 20 minutes, 2-3 times a day. ?Remove the ice if your skin turns bright red. This is very important. If you cannot feel pain, heat, or cold, you have a greater risk of damage to the area. ?Move your fingers often to reduce stiffness and swelling. ?Activity ?Rest your elbow and  wrist and avoid activities that cause symptoms as told by your health care provider. ?Do physical therapy exercises as told by your health care provider. ?If you lift an object, lift it with your palm facing up. This reduces stress on your elbow. ?Lifestyle ?If your tennis elbow is caused by sports, check your equipment and make sure that: ?You use it correctly. ?It is good match for you. ?If your tennis elbow is caused by work or computer  use, take frequent breaks to stretch your arm. Talk with your employer about ways to manage your condition at work. ?General instructions ?Take over-the-counter and prescription medicines only as told by your health care provider. ?Do not use any products that contain nicotine or tobacco. These products include cigarettes, chewing tobacco, and vaping devices, such as e-cigarettes. If you need help quitting, ask your health care provider. ?Keep all follow-up visits. This is important. ?How is this prevented? ?Before and after activity: ?Warm up and stretch before being active. ?Cool down and stretch after being active. ?Give your body time to rest between periods of activity. ?During activity: ?Make sure to use equipment that fits you. ?If you play tennis, put power in your stroke with your lower body. Avoid using your arm only. ?Maintain physical fitness, including: ?Strength. ?Flexibility. ?Endurance. ?Do exercises to strengthen the forearm muscles. ?Contact a health care provider if: ?You have pain that gets worse or does not get better with treatment. ?You have numbness or weakness in your forearm, hand, or fingers. ?Get help right away if: ?Your pain is severe. ?You cannot move your wrist. ?Summary ?Tennis elbow (lateral epicondylitis) is inflammation of tendons in your outer forearm, near your elbow. ?Common symptoms include pain and tenderness in your forearm and the outer part of your elbow. ?This condition is usually caused by repeatedly extending your wrist, turning your forearm, and using your hands. ?The first treatment is often resting and icing your arm to relieve symptoms. Further treatment may include taking medicine, getting physical therapy, wearing a brace or strap, or having surgery. ?This information is not intended to replace advice given to you by your health care provider. Make sure you discuss any questions you have with your health care provider. ?Document Revised: 01/30/2020 Document  Reviewed: 01/30/2020 ?Elsevier Patient Education ? [place]. ? ?

## 2021-11-24 ENCOUNTER — Ambulatory Visit: Payer: Medicaid Other | Admitting: Pulmonary Disease

## 2021-11-28 ENCOUNTER — Ambulatory Visit: Payer: Medicaid Other | Admitting: Pulmonary Disease
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
## 2021-12-23 ENCOUNTER — Ambulatory Visit: Payer: Medicaid Other | Admitting: "Endocrinology

## 2021-12-24 ENCOUNTER — Other Ambulatory Visit (HOSPITAL_COMMUNITY)
Admission: RE | Admit: 2021-12-24 | Discharge: 2021-12-24 | Disposition: A | Discharge status: Home / Self Care | Payer: Self-pay | Source: Ambulatory Visit | Attending: "Endocrinology | Admitting: "Endocrinology

## 2021-12-24 DIAGNOSIS — M816 Localized osteoporosis [Lequesne]: Secondary | ICD-10-CM | POA: Insufficient documentation

## 2021-12-24 LAB — COMPREHENSIVE METABOLIC PANEL
ALT: 20 U/L (ref 0–44)
AST: 18 U/L (ref 15–41)
Albumin: 3.6 g/dL (ref 3.5–5.0)
Alkaline Phosphatase: 57 U/L (ref 38–126)
Anion gap: 5 (ref 5–15)
BUN: 10 mg/dL (ref 6–20)
CO2: 26 mmol/L (ref 22–32)
Calcium: 8.2 mg/dL — ABNORMAL LOW (ref 8.9–10.3)
Chloride: 105 mmol/L (ref 98–111)
Creatinine, Ser: 0.59 mg/dL (ref 0.44–1.00)
GFR, Estimated: >60 mL/min (ref >60)
Glucose, Bld: 97 mg/dL (ref 70–99)
Potassium: 3.5 mmol/L (ref 3.5–5.1)
Sodium: 136 mmol/L (ref 135–145)
Total Bilirubin: 0.1 mg/dL — ABNORMAL LOW (ref 0.3–1.2)
Total Protein: 6.5 g/dL (ref 6.5–8.1)

## 2021-12-24 LAB — VITAMIN D 25 HYDROXY (VIT D DEFICIENCY, FRACTURES): Vit D, 25-Hydroxy: 25.71 ng/mL — ABNORMAL LOW (ref 30–100)

## 2021-12-30 ENCOUNTER — Ambulatory Visit (INDEPENDENT_AMBULATORY_CARE_PROVIDER_SITE_OTHER): Payer: Self-pay | Admitting: "Endocrinology

## 2021-12-30 ENCOUNTER — Encounter: Payer: Self-pay | Admitting: "Endocrinology

## 2021-12-30 VITALS — BP 108/78 | HR 80 | Ht 64.5 in | Wt 315.4 lb

## 2021-12-30 DIAGNOSIS — E559 Vitamin D deficiency, unspecified: Secondary | ICD-10-CM

## 2021-12-30 DIAGNOSIS — M816 Localized osteoporosis [Lequesne]: Secondary | ICD-10-CM

## 2021-12-30 DIAGNOSIS — R7303 Prediabetes: Secondary | ICD-10-CM

## 2021-12-30 NOTE — Patient Instructions (Signed)

## 2021-12-30 NOTE — Progress Notes (Signed)
12/30/2021      Endocrinology follow-up note   Past Medical History:  Diagnosis Date   Asthma 2008   COPD (chronic obstructive pulmonary disease) (HCC)    GERD (gastroesophageal reflux disease)    Multiple gastric ulcers    Osteoporosis    Pneumonia    Past Surgical History:  Procedure Laterality Date   CHOLECYSTECTOMY     DIAGNOSTIC LAPAROSCOPY WITH REMOVAL OF ECTOPIC PREGNANCY Right 01/24/2017   Procedure: REMOVAL OF ECTOPIC PREGNANCY;  Surgeon: Florian Buff, MD;  Location: [facility];  Service: Gynecology;  Laterality: Right;   LAPAROSCOPIC UNILATERAL SALPINGECTOMY Right 01/24/2017   Procedure: LAPAROSCOPIC UNILATERAL SALPINGECTOMY;  Surgeon: Florian Buff, MD;  Location: [facility];  Service: Gynecology;  Laterality: Right;   Social History   Socioeconomic History   Marital status: Single    Spouse name: Not on file   Number of children: Not on file   Years of education: Not on file   Highest education level: Not on file  Occupational History   Not on file  Tobacco Use   Smoking status: Former    Packs/day: 1.00    Years: 31.00    Pack years: 31.00    Types: Cigarettes    Start date: 32    Quit date: 02/24/2019    Years since quitting: 2.8   Smokeless tobacco: Never  Vaping Use   Vaping Use: Never used  Substance and Sexual Activity   Alcohol use: Yes    Comment: occasionally   Drug use: No   Sexual activity: Not Currently    Birth control/protection: None  Other Topics Concern   Not on file  Social History Narrative   Not on file   Social Determinants of Health   Financial Resource Strain: Not on file  Food Insecurity: Not on file  Transportation Needs: Not on file  Physical Activity: Not on file  Stress: Not on file  Social Connections: Not on file   Outpatient Encounter Medications as of 12/30/2021  Medication Sig   albuterol (PROVENTIL HFA) 108 (90 Base) MCG/ACT inhaler INHALE 1 TO 2  PUFFS BY MOUTH EVERY 6 HOURS AS NEEDED FOR COUGHING, WHEEZING, OR SHORTNESS OF BREATH   alendronate (FOSAMAX) 70 MG tablet Take 1 tablet (70 mg total) by mouth once a week. Take with a full glass of water on an empty stomach.   Calcium Carb-Cholecalciferol 500-600 MG-UNIT TABS Take 1 tablet by mouth 2 (two) times daily with a meal.   Cholecalciferol (VITAMIN D3) 50 MCG (2000 UT) TABS Take 2 tablets by mouth daily with breakfast.   Dupilumab (DUPIXENT) 300 MG/2ML SOPN 300 mg by Subconjunctival route every 14 (fourteen) days.   famotidine (PEPCID) 20 MG tablet Take 20 mg by mouth 2 (two) times daily.   fluticasone (FLONASE) 50 MCG/ACT nasal spray Place 1 spray into both nostrils 2 (two) times daily.   ipratropium-albuterol (DUONEB) 0.5-2.5 (3) MG/3ML SOLN INHALE 1 VIAL VIA NEBULIZER EVERY 6 HOURS AS NEEDED   loratadine (CLARITIN) 10 MG tablet TAKE 1 Tablet BY MOUTH ONCE EVERY DAY AS NEEDED   montelukast (SINGULAIR) 10 MG tablet TAKE 1 Tablet BY MOUTH ONCE EVERY  NIGHT AT BEDTIME   omeprazole (PRILOSEC) 40 MG capsule TAKE 1 Capsule BY MOUTH ONCE EVERY DAY   Simethicone (GAS-X PO) Take by mouth.   [DISCONTINUED] ALBUTEROL IN Inhale 2 puffs into the lungs as needed. For shortness of breath    [DISCONTINUED] Budeson-Glycopyrrol-Formoterol (BREZTRI AEROSPHERE) 160-9-4.8 MCG/ACT AERO Inhale 2 puffs into the lungs in the morning and at bedtime. (Patient not taking: Reported on 11/12/2021)   [DISCONTINUED] budesonide-formoterol (SYMBICORT) 160-4.5 MCG/ACT inhaler Inhale 2 puffs into the lungs 2 (two) times daily. (Patient not taking: Reported on 10/23/2021)   [DISCONTINUED] predniSONE (DELTASONE) 10 MG tablet Take 6 tablets x three days (60 mg), followed by 5 tablets x three days ('50mg'$ ), then 4 tablets x three day('40mg'$ ), then 3 tablets ('30mg'$ ) x three days, then 2 tablets ('20mg'$ ) x three days, then 1 tablet ('10mg'$ ) x three days, then STOP (Patient not taking: Reported on 11/12/2021)   No facility-administered  encounter medications on file as of 12/30/2021.   ALLERGIES: No Known Allergies  VACCINATION STATUS: Immunization History  Administered Date(s) Administered   Influenza,inj,Quad PF,6+ Mos 05/04/2018   PFIZER(Purple Top)SARS-COV-2 Vaccination 11/06/2019, 12/22/2019, 08/21/2020   Pneumococcal Polysaccharide-23 08/08/2020     HPI   Linda Ellis is 46 y.o. female who is returning with repeat labs after she was seen in consultation for osteoporosis.   -PCP:  [name], PA-C. See notes from previous visit. History is obtained directly from the patient and chart review. Her medical history is significant for exposure to high-dose steroids related to her COPD which in turn is related to her prior history of heavy smoking. She took prednisone up to 60 mg daily, several times yearly for the last 2 decades. Patient was diagnosed with osteoporosis on recent bone density performed on May 21, 2020.  Her Z score on L1-L4 was -2.9, T score -3.2. -In December 2021,  she was initiated on Fosamax 70 mg p.o. weekly.  She continues to tolerate this medication.  She has no new complaints today.    She denies any history of fragility fractures. -Based on her labs, she was also initiated on vitamin D and calcium supplements    She also eats dairy and green, leafy, vegetables.  She does not take high vitamin A doses. No h/o hyper/hypocalcemia. No h/o hyperparathyroidism. No history of thyroid dysfunction. No h/o kidney stones. No h/o thyrotoxicosis.   No history of  CKD.  She still has regular menstrual cycles. She has no family history of premenopausal osteoporosis.   I reviewed her chart and she also has a history of COPD which requires multiple bronchodilators, as well as recent initiation of omalizumab. She also has prediabetes, morbid obesity, hyperlipidemia.  She presents with 17 pounds of overall weight loss since last visit.  Review of Systems  Constitutional: + Progressive weight  gain + fatigue, no subjective hyperthermia, no subjective hypothermia   Objective:    BP 108/78   Pulse 80   Ht 5' 4.5" (1.638 m)   Wt (!) 315 lb 6.4 oz (143.1 kg)   BMI 53.30 kg/m   Wt Readings from Last 3 Encounters:  12/30/21 (!) 315 lb 6.4 oz (143.1 kg)  11/12/21 (!) 317 lb (143.8 kg)  08/20/21 (!) 317 lb (143.8 kg)    Physical Exam  Constitutional:  + BMI of 53.3 , not in acute distress, normal state of mind, + cushingoid appearance Eyes: PERRLA, EOMI, no exophthalmos ENT: moist mucous membranes, no thyromegaly, no cervical lymphadenopathy Cardiovascular: normal precordial activity, Regular  Rate and Rhythm, no Murmur/Rubs/Gallops, + bilateral supraclavicular fullness, +dorsal cervical fat pad.   CMP ( most recent) CMP     Component Value Date/Time   NA 136 12/24/2021 1117   K 3.5 12/24/2021 1117   CL 105 12/24/2021 1117   CO2 26 12/24/2021 1117   GLUCOSE 97 12/24/2021 1117   BUN 10 12/24/2021 1117   CREATININE 0.59 12/24/2021 1117   CALCIUM 8.2 (L) 12/24/2021 1117   PROT 6.5 12/24/2021 1117   ALBUMIN 3.6 12/24/2021 1117   AST 18 12/24/2021 1117   ALT 20 12/24/2021 1117   ALKPHOS 57 12/24/2021 1117   BILITOT 0.1 (L) 12/24/2021 1117   GFRNONAA >60 12/24/2021 1117   GFRAA >60 08/23/2019 1125   Recent Results (from the past 2160 hour(s))  Comprehensive metabolic panel     Status: Abnormal   Collection Time: 12/24/21 11:17 AM  Result Value Ref Range   Sodium 136 135 - 145 mmol/L   Potassium 3.5 3.5 - 5.1 mmol/L   Chloride 105 98 - 111 mmol/L   CO2 26 22 - 32 mmol/L   Glucose, Bld 97 70 - 99 mg/dL    Comment: Glucose reference range applies only to samples taken after fasting for at least 8 hours.   BUN 10 6 - 20 mg/dL   Creatinine, Ser 0.59 0.44 - 1.00 mg/dL   Calcium 8.2 (L) 8.9 - 10.3 mg/dL   Total Protein 6.5 6.5 - 8.1 g/dL   Albumin 3.6 3.5 - 5.0 g/dL   AST 18 15 - 41 U/L   ALT 20 0 - 44 U/L   Alkaline Phosphatase 57 38 - 126 U/L   Total Bilirubin  0.1 (L) 0.3 - 1.2 mg/dL   GFR, Estimated >60 >60 mL/min    Comment: (NOTE) Calculated using the CKD-EPI Creatinine Equation (2021)    Anion gap 5 5 - 15    Comment: Performed at [facility], [address] [place], [place] [address]  VITAMIN D 25 Hydroxy (Vit-D Deficiency, Fractures)     Status: Abnormal   Collection Time: 12/24/21 11:17 AM  Result Value Ref Range   Vit D, 25-Hydroxy 25.71 (L) 30 - 100 ng/mL    Comment: (NOTE) Vitamin D deficiency has been defined by the [place] practice guideline as a level of serum 25-OH  vitamin D less than 20 ng/mL (1,2). The Endocrine Society went on to  further define vitamin D insufficiency as a level between 21 and 29  ng/mL (2).  1. IOM (Institute of Medicine). 2010. Dietary reference intakes for  calcium and D. [place]: The [employer]. 2. Holick MF, Binkley [place], Bischoff-Ferrari HA, et al. Evaluation,  treatment, and prevention of vitamin D deficiency: an Endocrine  Society clinical practice guideline, JCEM. 2011 Jul; 96(7): 1911-30.  Performed at [facility], [place] [address], [place], [place] [address]     Diabetic Labs (most recent): Lab Results  Component Value Date   HGBA1C 5.9 (H) 05/05/2021   HGBA1C 6.1 (H) 05/29/2020   HGBA1C 5.8 (H) 08/23/2019     Lipid Panel ( most recent) Lipid Panel     Component Value Date/Time   CHOL 250 (H) 05/29/2020 0838   TRIG 91 05/29/2020 0838   HDL 103 05/29/2020 0838   CHOLHDL 2.4 05/29/2020 0838   VLDL 18 05/29/2020 0838   LDLCALC 129 (H) 05/29/2020 0838     Bone density from May 21, 2020 Right femur neck  T score +0.0, Z score +0.3 Left femoral neck T score -0.3, Z score +0.0 AP L1, L3 and L4 T score -3.2, Z score -2.9     Assessment: 1. Glucocorticoid induced osteoporosis 2. Morbid obesity 3. Prediabetes 4. Dyslipidemia 5.  Vitamin D deficiency   Plan: 1. Premenopausal osteoporosis -She had  significant abnormality in her bone density although no comparison with  previous studies, it is likely glucocorticoid induced.   It is possible that she never achieved optimal bone density.  -She is advised to continue Fosamax 70 mg p.o. weekly which she continues to tolerate.  Side effects and precautions with Fosamax discussed with her.  She will be considered for repeat bone density in October 2024 prior to her next visit.   She gives history of heavy exposure to steroids related to COPD/asthma which in turn is related to her history of smoking for 30+ years before she quit in 2020.  Given her T score of -3, Z score -2.9 , she has increased risk of fracture.   -She still has vitamin D deficiency, advised to increase her vitamin D3 to 4000 units daily in addition to her  Os-Cal, supplement.   -I discussed fall precautions to avoid fractures.  Regarding her prediabetes/morbid obesity: Her previsit labs show A1c of 5.9%, not on any medications.    - she acknowledges that there is a room for improvement in her food and drink choices.  She is a candidate for whole food plant-based diet which is discussed in detail with her. - Suggestion is made for her to avoid simple carbohydrates  from her diet including Cakes, Sweet Desserts, Ice Cream, Soda (diet and regular), Sweet Tea, Candies, Chips, Cookies, Store Bought Juices, Alcohol in Excess of  1-2 drinks a day, Artificial Sweeteners,  Coffee Creamer, and "Sugar-free" Products, Lemonade. This will help patient to have more stable blood glucose profile and potentially avoid unintended weight gain.  - I advised patient to maintain close follow up with [name], PA-C for primary care needs.    I spent 33 minutes in the care of the patient today including review of labs from Thyroid Function, CMP, and other relevant labs ; imaging/biopsy records (current and previous including abstractions from other facilities); face-to-face time discussing  her  lab results and symptoms, medications doses, her options of short and long term treatment based on the latest standards of care / guidelines;   and documenting the encounter.  [name]  participated in the discussions, expressed understanding, and voiced agreement with the above plans.  All questions were answered to her satisfaction. she is encouraged to contact clinic should she have any questions or concerns prior to her return visit.    Follow up plan: Return in about 6 months (around 07/02/2022) for F/U with Pre-visit Labs, DXA Scan B4 NV.   Glade Lloyd, MD [facility] [address] [place], [place] [address] Phone: [phone]  Fax: [phone]     12/30/2021, 5:37 PM  This note was partially dictated with voice recognition software. Similar sounding words can be transcribed inadequately or may not  be corrected upon review.

## 2022-01-06 ENCOUNTER — Other Ambulatory Visit: Payer: Self-pay | Admitting: Pulmonary Disease

## 2022-01-06 DIAGNOSIS — J4551 Severe persistent asthma with (acute) exacerbation: Secondary | ICD-10-CM

## 2022-01-22 ENCOUNTER — Telehealth: Payer: Self-pay | Admitting: Pulmonary Disease

## 2022-01-22 NOTE — Telephone Encounter (Signed)
Called and spoke with patient who states that she has been having increased shortness of breath that started about 4 days ago and then yesterday it got worse. Has been using nebulizer and rescue inhaler more than normal. Productive cough with clear/tan sputum. Denies fever just feels like she has a lot of phlegm in her chest. Patient has been scheduled for visit with Dr. Maple Hudson for tomorrow. Nothing further needed at this time.

## 2022-01-23 ENCOUNTER — Encounter: Payer: Self-pay | Admitting: Internal Medicine

## 2022-01-23 ENCOUNTER — Ambulatory Visit (INDEPENDENT_AMBULATORY_CARE_PROVIDER_SITE_OTHER): Payer: Self-pay | Admitting: Internal Medicine

## 2022-01-23 VITALS — BP 112/72 | HR 97 | Temp 98.0°F | Ht 64.0 in | Wt 320.4 lb

## 2022-01-23 DIAGNOSIS — J4551 Severe persistent asthma with (acute) exacerbation: Secondary | ICD-10-CM

## 2022-01-23 DIAGNOSIS — Z7952 Long term (current) use of systemic steroids: Secondary | ICD-10-CM

## 2022-01-23 MED ORDER — BREZTRI AEROSPHERE 160-9-4.8 MCG/ACT IN AERO: 2.0000 | INHALATION_SPRAY | Freq: Two times a day (BID) | RESPIRATORY_TRACT | 0 refills | Status: DC

## 2022-01-23 MED ORDER — PREDNISONE 10 MG PO TABS: ORAL_TABLET | ORAL | 0 refills | Status: DC

## 2022-01-23 MED ORDER — METHYLPREDNISOLONE ACETATE 80 MG/ML IJ SUSP
80.0000 mg | Freq: Once | INTRAMUSCULAR | Status: AC
  Administered 2022-01-23: 80 mg via INTRAMUSCULAR

## 2022-01-23 NOTE — Assessment & Plan Note (Signed)
This patient probably should be seen monthly.  Her own components marked by failure to renew maintenance inhaler, failure to reschedule after missed appointment for COVID infection. Plan-prednisone taper, Depo-Medrol, Breztri samples, first available return visit with Dr. Everardo All.

## 2022-01-23 NOTE — Assessment & Plan Note (Signed)
Obesity likely to be an aggravating factor for her asthma control.  Its not obvious that she can change her lifestyle sufficiently to accomplish useful weight loss.

## 2022-02-11 ENCOUNTER — Other Ambulatory Visit: Payer: Self-pay | Admitting: Physician Assistant

## 2022-02-11 ENCOUNTER — Ambulatory Visit: Payer: Medicaid Other | Admitting: Pulmonary Disease

## 2022-02-13 ENCOUNTER — Telehealth: Payer: Self-pay | Admitting: Pulmonary Disease

## 2022-02-13 MED ORDER — PREDNISONE 10 MG PO TABS: ORAL_TABLET | ORAL | 0 refills | Status: DC

## 2022-02-13 MED ORDER — MOLNUPIRAVIR EUA 200MG CAPSULE
4.0000 | ORAL_CAPSULE | Freq: Two times a day (BID) | ORAL | 0 refills | Status: AC
Start: 2022-02-13 — End: 2022-02-18

## 2022-02-13 NOTE — Telephone Encounter (Signed)
Spoke with the pt and notified of response per Dr Annamaria Boots. She verbalized understanding. Rxs were sent to pharm.

## 2022-02-13 NOTE — Telephone Encounter (Signed)
Please send molnupiravir antiviral x 5 days.                      Prednisone taper 10 mg, # 32:  6 x 2 days, 4 X 2 DAYS, 3 X 2 DAYS, 2 X 2 DAYS, 1 X 2 DAYS

## 2022-02-13 NOTE — Telephone Encounter (Signed)
She was having shortness of breath and wheezing for about 4-5 days and she tested this morning for Covid and she was positive. She has not been able to sleep at night and only had a fever last night of 101.   She is having some Diarrhea for 1-2 days and wanted to know if that was a symptom of Covid. She is not able to keep much down other than water. She is feeling weak due to the diarrhea and the not being able to eat.  She wants to know if you could send in 60 mg and the 50 mg taper. She has had the 40 mg taper in the past and it does not help. Please advise.

## 2022-02-17 ENCOUNTER — Telehealth: Payer: Self-pay | Admitting: Pulmonary Disease

## 2022-02-18 NOTE — Telephone Encounter (Signed)
Ok to write note to return to work on 7/20

## 2022-02-18 NOTE — Telephone Encounter (Signed)
Letter created. Nothing further needed

## 2022-02-18 NOTE — Telephone Encounter (Signed)
Called and left voicemail for patient to call office back in regards to office note that she is needing

## 2022-02-18 NOTE — Telephone Encounter (Signed)
Called patient and she states that she is needing an office note stating that she can return back to work on 7/20. Patient states that she tested negative this morning.   Are you ok with me writing this letter?  Please advise

## 2022-02-23 ENCOUNTER — Telehealth: Payer: Self-pay | Admitting: Pulmonary Disease

## 2022-02-23 DIAGNOSIS — Z0289 Encounter for other administrative examinations: Secondary | ICD-10-CM

## 2022-02-23 NOTE — Telephone Encounter (Signed)
FMLA paperwork hand carried to office by patient on 7/28 - due to insurance by 8/6. Patient has appointment with Dr. Loanne Drilling on 7/28.   I called patient and she agreed that the form should say 1-2 flare-ups/month lasting 2-4 hours per episode.  Her asthma does not interfere with her ability to perform her job.  Completed form and sent to Dr. Loanne Drilling for review and signature after patient's 7/28 appt.

## 2022-02-27 ENCOUNTER — Encounter: Payer: Self-pay | Admitting: Pulmonary Disease

## 2022-02-27 ENCOUNTER — Ambulatory Visit (INDEPENDENT_AMBULATORY_CARE_PROVIDER_SITE_OTHER): Payer: Self-pay | Admitting: Pulmonary Disease

## 2022-02-27 DIAGNOSIS — J4551 Severe persistent asthma with (acute) exacerbation: Secondary | ICD-10-CM

## 2022-02-27 DIAGNOSIS — Z7952 Long term (current) use of systemic steroids: Secondary | ICD-10-CM

## 2022-02-27 MED ORDER — ALBUTEROL SULFATE HFA 108 (90 BASE) MCG/ACT IN AERS: INHALATION_SPRAY | RESPIRATORY_TRACT | 3 refills | Status: DC

## 2022-02-27 MED ORDER — FLUTICASONE-SALMETEROL 250-50 MCG/ACT IN AEPB: 1.0000 | INHALATION_SPRAY | Freq: Two times a day (BID) | RESPIRATORY_TRACT | 5 refills | Status: DC

## 2022-02-27 NOTE — Patient Instructions (Signed)
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 If you symptoms do not improve in 24-48 hours, start steroid pack that has been prescribed. Please call our office for evaluation and to let us know you steroid the steroid pack (60,50,40,30,20,10 pack).  Follow-up with me in 3 months
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
## 2022-03-07 ENCOUNTER — Emergency Department (HOSPITAL_COMMUNITY)
Admission: EM | Admit: 2022-03-07 | Discharge: 2022-03-07 | Disposition: A | Discharge status: Home / Self Care | Payer: Self-pay | Attending: Emergency Medicine | Admitting: Emergency Medicine

## 2022-03-07 ENCOUNTER — Emergency Department (HOSPITAL_COMMUNITY): Payer: Medicaid Other

## 2022-03-07 ENCOUNTER — Encounter (HOSPITAL_COMMUNITY): Payer: Self-pay

## 2022-03-07 DIAGNOSIS — W01198A Fall on same level from slipping, tripping and stumbling with subsequent striking against other object, initial encounter: Secondary | ICD-10-CM | POA: Insufficient documentation

## 2022-03-07 DIAGNOSIS — S42302A Unspecified fracture of shaft of humerus, left arm, initial encounter for closed fracture: Secondary | ICD-10-CM | POA: Insufficient documentation

## 2022-03-07 MED ORDER — IBUPROFEN 600 MG PO TABS: 600.0000 mg | ORAL_TABLET | Freq: Four times a day (QID) | ORAL | 0 refills | Status: DC | PRN

## 2022-03-07 MED ORDER — OXYCODONE-ACETAMINOPHEN 5-325 MG PO TABS: 2.0000 | ORAL_TABLET | Freq: Four times a day (QID) | ORAL | 0 refills | Status: DC | PRN

## 2022-03-07 MED ORDER — FENTANYL CITRATE PF 50 MCG/ML IJ SOSY
50.0000 ug | PREFILLED_SYRINGE | Freq: Once | INTRAMUSCULAR | Status: AC
  Administered 2022-03-07: 50 ug via INTRAMUSCULAR
  Filled 2022-03-07: qty 1

## 2022-03-07 MED ORDER — HYDROMORPHONE HCL 1 MG/ML IJ SOLN
1.0000 mg | Freq: Once | INTRAMUSCULAR | Status: AC
  Administered 2022-03-07: 1 mg via INTRAVENOUS
  Filled 2022-03-07: qty 1

## 2022-03-07 NOTE — ED Triage Notes (Addendum)
Pt states she fell at work the patient states she had mechanical fall at work, tripping over post. Pt states she hit her face and injured left arm. Pt c/o pain from wrist to shoulder.  No LOC, no blood thinners

## 2022-03-07 NOTE — ED Provider Notes (Signed)
[facility] EMERGENCY DEPARTMENT Provider Note   CSN: [id number] Arrival date & time: 03/07/22  1344     History Chief Complaint  Patient presents with   Fall   Arm Injury    Linda Ellis is a 46 y.o. female patient with history of osteoporosis who presents to the emergency department today with left arm pain and head pain after a mechanical trip and fall.  Patient was at work when she went around tripped and fell.  She struck her face against the metal plate and fell on her left arm.  Patient did not lose consciousness.  Patient's main complaint today is just the left arm pain.  Hurts with any movement.   Fall  Arm Injury      Home Medications Prior to Admission medications   Medication Sig Start Date End Date Taking? Authorizing Provider  ibuprofen (ADVIL) 600 MG tablet Take 1 tablet (600 mg total) by mouth every 6 (six) hours as needed. 03/07/22  Yes Raul Del, Elizah Mierzwa M, PA-C  oxyCODONE-acetaminophen (PERCOCET/ROXICET) 5-325 MG tablet Take 2 tablets by mouth every 6 (six) hours as needed for severe pain. 03/07/22  Yes Raul Del, Lyn Deemer M, PA-C  albuterol (VENTOLIN HFA) 108 (90 Base) MCG/ACT inhaler INHALE 1 TO 2 PUFFS BY MOUTH EVERY 6 HOURS AS NEEDED FOR COUGHING OR WHEEZING OR SHORTNESS OF BREATH 02/27/22   Margaretha Seeds, MD  alendronate (FOSAMAX) 70 MG tablet Take 1 tablet (70 mg total) by mouth once a week. Take with a full glass of water on an empty stomach. 06/25/21   Cassandria Anger, MD  Calcium Carb-Cholecalciferol 500-600 MG-UNIT TABS Take 1 tablet by mouth 2 (two) times daily with a meal. 07/08/20   Nida, Marella Chimes, MD  Cholecalciferol (VITAMIN D3) 50 MCG (2000 UT) TABS Take 2 tablets by mouth daily with breakfast.    [provider]  Dupilumab (DUPIXENT) 300 MG/2ML SOPN 300 mg by Subconjunctival route every 14 (fourteen) days. Patient not taking: Reported on 02/27/2022 10/20/21   Margaretha Seeds, MD  famotidine (PEPCID) 20 MG tablet Take 20 mg by mouth  2 (two) times daily.    [provider]  fluticasone (FLONASE) 50 MCG/ACT nasal spray Place 1 spray into both nostrils 2 (two) times daily. 07/02/21   [name], PA-C  fluticasone-salmeterol (ADVAIR DISKUS) 250-50 MCG/ACT AEPB Inhale 1 puff into the lungs in the morning and at bedtime. 02/27/22   Margaretha Seeds, MD  ipratropium-albuterol (DUONEB) 0.5-2.5 (3) MG/3ML SOLN INHALE 1 VIAL VIA NEBULIZER EVERY 6 HOURS AS NEEDED 08/25/21   [name], PA-C  loratadine (CLARITIN) 10 MG tablet TAKE 1 Tablet BY MOUTH ONCE EVERY DAY AS NEEDED 10/20/21   [name], PA-C  montelukast (SINGULAIR) 10 MG tablet TAKE 1 Tablet BY MOUTH ONCE EVERY NIGHT AT BEDTIME 10/23/21   Margaretha Seeds, MD  omeprazole (PRILOSEC) 40 MG capsule TAKE 1 Capsule BY MOUTH ONCE EVERY DAY 08/25/21   [name], PA-C  predniSONE (DELTASONE) 10 MG tablet 6 x 2 days, 4 x 2 days, 3 x 2 days, 2 x 2 days, 1 x 2 days, then stop 02/13/22   Baird Lyons D, MD  Simethicone (GAS-X PO) Take by mouth.    [provider]  ALBUTEROL IN Inhale 2 puffs into the lungs as needed. For shortness of breath   10/24/11  [provider]      Allergies    Patient has no known allergies.    Review of Systems  Review of Systems  All other systems reviewed and are negative.   Physical Exam Updated Vital Signs BP (!) 145/110 (BP Location: Right Wrist)   Pulse (!) 122   Temp 98.1 F (36.7 C) (Oral)   Resp 18   Wt (!) 141.1 kg   LMP 02/28/2022   SpO2 98%   BMI 50.21 kg/m  Physical Exam Vitals and nursing note reviewed.  Constitutional:      Appearance: Normal appearance.  HENT:     Head: Normocephalic.     Comments: No tenderness over the face.  No significant ecchymosis.  No abrasions. Eyes:     General:        Right eye: No discharge.        Left eye: No discharge.     Conjunctiva/sclera: Conjunctivae normal.  Pulmonary:     Effort: Pulmonary effort is normal.  Musculoskeletal:      Comments: Tenderness to the left upper extremity along the humerus.  2+ radial pulse felt on the left.  Good cap refill.  Good grip strength.  Neurovascular intact distally.  Compartments are soft.  Skin:    General: Skin is warm and dry.     Findings: No rash.  Neurological:     General: No focal deficit present.     Mental Status: She is alert.  Psychiatric:        Mood and Affect: Mood normal.        Behavior: Behavior normal.     ED Results / Procedures / Treatments   Labs (all labs ordered are listed, but only abnormal results are displayed) Labs Reviewed - No data to display  EKG None  Radiology DG Shoulder Left  Result Date: 03/07/2022 CLINICAL DATA:  Fall. EXAM: LEFT SHOULDER - 2+ VIEW COMPARISON:  None Available. FINDINGS: Three views study shows no evidence for shoulder dislocation or separation. There is a comminuted fracture of the mid humeral diaphysis evident, not well demonstrated on this dedicated shoulder study. IMPRESSION: Comminuted fracture of the mid humeral diaphysis, not well demonstrated on this dedicated shoulder study. Electronically Signed   By: Misty Stanley M.D.   On: 03/07/2022 14:49   DG Humerus Left  Result Date: 03/07/2022 CLINICAL DATA:  Fall or.  Left arm pain. EXAM: LEFT HUMERUS - 2+ VIEW COMPARISON:  None Available. FINDINGS: Spiral, mildly comminuted and displaced fracture of the mid to distal humeral diaphysis. Butterfly fracture component is displaced medially by 9 mm. Primary proximal and distal fracture components relatively well aligned. No other fractures.  No bone lesions. Glenohumeral joint, AC joint and elbow joint are normally aligned. IMPRESSION: 1. Mildly displaced and comminuted mid to distal diaphyseal fracture of the left humerus. No dislocation. Electronically Signed   By: Lajean Manes M.D.   On: 03/07/2022 14:48   DG Wrist Complete Left  Result Date: 03/07/2022 CLINICAL DATA:  Fall at work.  Patient injured her left arm. EXAM: LEFT  WRIST - COMPLETE 3+ VIEW COMPARISON:  None Available. FINDINGS: No fracture or bone lesion. Joints are normally spaced and aligned.  No arthropathic changes. Soft tissues are unremarkable. IMPRESSION: Negative. Electronically Signed   By: Lajean Manes M.D.   On: 03/07/2022 14:46    Procedures .Ortho Injury Treatment  Date/Time: 03/07/2022 4:03 PM  Performed by: Hendricks Limes, PA-C Authorized by: Hendricks Limes, PA-C   Consent:    Consent obtained:  Verbal   Consent given by:  Patient   Risks discussed:  Fracture and nerve damage  Alternatives discussed:  ImmobilizationInjury location: upper arm Location details: left upper arm Injury type: fracture Fracture type: humeral shaft Pre-procedure neurovascular assessment: neurovascularly intact  Anesthesia: Local anesthesia used: no Manipulation performed: no Immobilization: splint Splint type: Coaptation. Splint Applied by: ED Provider Supplies used: Ortho-Glass Post-procedure neurovascular assessment: post-procedure neurovascularly intact       Medications Ordered in ED Medications  fentaNYL (SUBLIMAZE) injection 50 mcg (50 mcg Intramuscular Given 03/07/22 1456)  HYDROmorphone (DILAUDID) injection 1 mg (1 mg Intravenous Given 03/07/22 1552)    ED Course/ Medical Decision Making/ A&P Clinical Course as of 03/07/22 1612  Sat Mar 07, 2022  1512 Spoke with Dr. Aline Brochure with orthopedics who recommends coaptation splint, shoulder splint, and follow-up in the office on Monday. [CF]  4332 DG Shoulder Left There is no evidence of humeral head fracture but there is evidence of midshaft humerus fracture. [CF]  9518 DG Humerus Left Midshaft humeral fracture that appears to be somewhat displaced.  I do agree with the radiologist interpretation. [CF]  8416 DG Wrist Complete Left Normal.  I do agree with the radiologist interpretation. [CF]    Clinical Course User Index [CF] Hendricks Limes, PA-C                            Medical Decision Making SHAMICA MOREE is a 46 y.o. female patient who presents to the emergency room today for further evaluation of mechanical trip and fall.  History the patient did hit her head and fell on her left arm we will get imaging of the left shoulder and humerus.  I have a low suspicion for intracranial hemorrhage at this time.  Patient has painful range of motion with any movement.  I am concerned for possible fracture.  We will give her pain medication and plan to reassess.  I personally splinted this patient she was neurovascular intact pre and post procedure.  I gave her 1 mg of Dilaudid for pain control.  This was adequate.  I will have her follow-up with orthopedics for further evaluation.  She is safe for discharge at this time.  Strict return precautions were discussed.  Amount and/or Complexity of Data Reviewed Radiology: ordered.  Risk Prescription drug management.    Final Clinical Impression(s) / ED Diagnoses Final diagnoses:  Closed fracture of shaft of left humerus, unspecified fracture morphology, initial encounter    Rx / DC Orders ED Discharge Orders          Ordered    oxyCODONE-acetaminophen (PERCOCET/ROXICET) 5-325 MG tablet  Every 6 hours PRN        03/07/22 1610    ibuprofen (ADVIL) 600 MG tablet  Every 6 hours PRN        03/07/22 1610              Myna Bright [place], [place] 03/07/22 1613    Milton Ferguson, MD 03/07/22 1731

## 2022-03-07 NOTE — Discharge Instructions (Signed)
Please take 600 mg ibuprofen every 6 hours as needed for pain.  You can use Percocet for breakthrough pain.  Please call Dr. Ruthe Mannan office on Monday to schedule an appointment.  Please return to the [facility] apartment for any worsening symptoms you might have.

## 2022-03-08 ENCOUNTER — Other Ambulatory Visit: Payer: Self-pay

## 2022-03-08 ENCOUNTER — Emergency Department (HOSPITAL_COMMUNITY)
Admission: EM | Admit: 2022-03-08 | Discharge: 2022-03-09 | Disposition: A | Discharge status: Home / Self Care | Payer: Self-pay | Attending: Emergency Medicine | Admitting: Emergency Medicine

## 2022-03-08 ENCOUNTER — Encounter (HOSPITAL_COMMUNITY): Payer: Self-pay

## 2022-03-08 ENCOUNTER — Emergency Department (HOSPITAL_COMMUNITY): Payer: Self-pay

## 2022-03-08 DIAGNOSIS — W19XXXA Unspecified fall, initial encounter: Secondary | ICD-10-CM | POA: Insufficient documentation

## 2022-03-08 DIAGNOSIS — M79632 Pain in left forearm: Secondary | ICD-10-CM | POA: Insufficient documentation

## 2022-03-08 DIAGNOSIS — M79602 Pain in left arm: Secondary | ICD-10-CM | POA: Insufficient documentation

## 2022-03-08 DIAGNOSIS — Y990 Civilian activity done for income or pay: Secondary | ICD-10-CM | POA: Insufficient documentation

## 2022-03-08 MED ORDER — HYDROMORPHONE HCL 1 MG/ML IJ SOLN
1.0000 mg | Freq: Once | INTRAMUSCULAR | Status: AC
  Administered 2022-03-08: 1 mg via INTRAMUSCULAR
  Filled 2022-03-08: qty 1

## 2022-03-08 MED ORDER — HYDROMORPHONE HCL 1 MG/ML IJ SOLN: 1.0000 mg | Freq: Once | INTRAMUSCULAR | Status: DC

## 2022-03-08 MED ORDER — FENTANYL CITRATE PF 50 MCG/ML IJ SOSY
50.0000 ug | PREFILLED_SYRINGE | Freq: Once | INTRAMUSCULAR | Status: AC
  Administered 2022-03-08: 50 ug via INTRAVENOUS
  Filled 2022-03-08: qty 1

## 2022-03-08 NOTE — ED Provider Notes (Signed)
[facility] EMERGENCY DEPARTMENT Provider Note   CSN: [id number] Arrival date & time: 03/08/22  2055     History  Chief Complaint  Patient presents with   Arm Pain    Linda Ellis is a 46 y.o. female.  Patient with history of osteoporosis returns to the emergency department today with complaints of left arm pain. She states that same occurred yesterday when she had a mechanical fall at work. She was seen yesterday for same and found to have a humerus fracture and had a coaptation splint placed and was given percocet and a sling. She states that her pain is unbearable despite taking the percocet as prescribed, returns for same. States that she has only taken 2 200 mg ibuprofen however. States that she is unable to wear the sling because it makes the pain worse and so she took it off and has to hold her arm in a certain position. States that her pain is mostly in her left forearm and not her humerus. Denies any new injury.   The history is provided by the patient. No language interpreter was used.  Arm Pain       Home Medications Prior to Admission medications   Medication Sig Start Date End Date Taking? Authorizing Provider  albuterol (VENTOLIN HFA) 108 (90 Base) MCG/ACT inhaler INHALE 1 TO 2 PUFFS BY MOUTH EVERY 6 HOURS AS NEEDED FOR COUGHING OR WHEEZING OR SHORTNESS OF BREATH 02/27/22   Margaretha Seeds, MD  alendronate (FOSAMAX) 70 MG tablet Take 1 tablet (70 mg total) by mouth once a week. Take with a full glass of water on an empty stomach. 06/25/21   Cassandria Anger, MD  Calcium Carb-Cholecalciferol 500-600 MG-UNIT TABS Take 1 tablet by mouth 2 (two) times daily with a meal. 07/08/20   Nida, Marella Chimes, MD  Cholecalciferol (VITAMIN D3) 50 MCG (2000 UT) TABS Take 2 tablets by mouth daily with breakfast.    [provider]  Dupilumab (DUPIXENT) 300 MG/2ML SOPN 300 mg by Subconjunctival route every 14 (fourteen) days. Patient not taking: Reported on 02/27/2022  10/20/21   Margaretha Seeds, MD  famotidine (PEPCID) 20 MG tablet Take 20 mg by mouth 2 (two) times daily.    [provider]  fluticasone (FLONASE) 50 MCG/ACT nasal spray Place 1 spray into both nostrils 2 (two) times daily. 07/02/21   [name], PA-C  fluticasone-salmeterol (ADVAIR DISKUS) 250-50 MCG/ACT AEPB Inhale 1 puff into the lungs in the morning and at bedtime. 02/27/22   Margaretha Seeds, MD  ibuprofen (ADVIL) 600 MG tablet Take 1 tablet (600 mg total) by mouth every 6 (six) hours as needed. 03/07/22   Myna Bright M, PA-C  ipratropium-albuterol (DUONEB) 0.5-2.5 (3) MG/3ML SOLN INHALE 1 VIAL VIA NEBULIZER EVERY 6 HOURS AS NEEDED 08/25/21   [name], PA-C  loratadine (CLARITIN) 10 MG tablet TAKE 1 Tablet BY MOUTH ONCE EVERY DAY AS NEEDED 10/20/21   [name], PA-C  montelukast (SINGULAIR) 10 MG tablet TAKE 1 Tablet BY MOUTH ONCE EVERY NIGHT AT BEDTIME 10/23/21   Margaretha Seeds, MD  omeprazole (PRILOSEC) 40 MG capsule TAKE 1 Capsule BY MOUTH ONCE EVERY DAY 08/25/21   [name], PA-C  oxyCODONE-acetaminophen (PERCOCET/ROXICET) 5-325 MG tablet Take 2 tablets by mouth every 6 (six) hours as needed for severe pain. 03/07/22   Myna Bright M, PA-C  predniSONE (DELTASONE) 10 MG tablet 6 x 2 days, 4 x 2 days, 3 x 2 days, 2 x 2 days,  1 x 2 days, then stop 02/13/22   Baird Lyons D, MD  Simethicone (GAS-X PO) Take by mouth.    [provider]  ALBUTEROL IN Inhale 2 puffs into the lungs as needed. For shortness of breath   10/24/11  [provider]      Allergies    Patient has no known allergies.    Review of Systems   Review of Systems  Musculoskeletal:  Positive for arthralgias.  All other systems reviewed and are negative.   Physical Exam Updated Vital Signs BP (!) 153/125 (BP Location: Right Arm)   Pulse 99   Temp 98.2 F (36.8 C) (Oral)   Resp 20   Ht '5\' 6"'$  (1.676 m)   Wt (!) 141.1 kg   LMP 02/28/2022   SpO2 98%   BMI  50.21 kg/m  Physical Exam Vitals and nursing note reviewed.  Constitutional:      General: She is not in acute distress.    Appearance: Normal appearance. She is normal weight. She is not ill-appearing, toxic-appearing or diaphoretic.     Comments: Patient is tearful in the room in obvious discomfort but no acute distress  HENT:     Head: Normocephalic and atraumatic.  Cardiovascular:     Rate and Rhythm: Normal rate.  Pulmonary:     Effort: Pulmonary effort is normal. No respiratory distress.  Musculoskeletal:        General: Normal range of motion.     Cervical back: Normal range of motion.     Comments: Radial pulses intact and 2+ distal sensation intact. Coaptation splint present over the left humerus. Patient is holding her arm and no sling is present. Tenderness noted over the left forearm without any obvious deformity or overlying skin changes.  Skin:    General: Skin is warm and dry.  Neurological:     General: No focal deficit present.     Mental Status: She is alert.  Psychiatric:        Mood and Affect: Mood normal.        Behavior: Behavior normal.     ED Results / Procedures / Treatments   Labs (all labs ordered are listed, but only abnormal results are displayed) Labs Reviewed - No data to display  EKG None  Radiology DG Shoulder Left  Result Date: 03/07/2022 CLINICAL DATA:  Fall. EXAM: LEFT SHOULDER - 2+ VIEW COMPARISON:  None Available. FINDINGS: Three views study shows no evidence for shoulder dislocation or separation. There is a comminuted fracture of the mid humeral diaphysis evident, not well demonstrated on this dedicated shoulder study. IMPRESSION: Comminuted fracture of the mid humeral diaphysis, not well demonstrated on this dedicated shoulder study. Electronically Signed   By: Misty Stanley M.D.   On: 03/07/2022 14:49   DG Humerus Left  Result Date: 03/07/2022 CLINICAL DATA:  Fall or.  Left arm pain. EXAM: LEFT HUMERUS - 2+ VIEW COMPARISON:  None  Available. FINDINGS: Spiral, mildly comminuted and displaced fracture of the mid to distal humeral diaphysis. Butterfly fracture component is displaced medially by 9 mm. Primary proximal and distal fracture components relatively well aligned. No other fractures.  No bone lesions. Glenohumeral joint, AC joint and elbow joint are normally aligned. IMPRESSION: 1. Mildly displaced and comminuted mid to distal diaphyseal fracture of the left humerus. No dislocation. Electronically Signed   By: Lajean Manes M.D.   On: 03/07/2022 14:48   DG Wrist Complete Left  Result Date: 03/07/2022 CLINICAL DATA:  Fall  at work.  Patient injured her left arm. EXAM: LEFT WRIST - COMPLETE 3+ VIEW COMPARISON:  None Available. FINDINGS: No fracture or bone lesion. Joints are normally spaced and aligned.  No arthropathic changes. Soft tissues are unremarkable. IMPRESSION: Negative. Electronically Signed   By: Lajean Manes M.D.   On: 03/07/2022 14:46    Procedures Procedures    Medications Ordered in ED Medications  fentaNYL (SUBLIMAZE) injection 50 mcg (has no administration in time range)  HYDROmorphone (DILAUDID) injection 1 mg (1 mg Intramuscular Given 03/08/22 2246)    ED Course/ Medical Decision Making/ A&P Clinical Course as of 03/08/22 2348  Sun Mar 08, 2022  2319 Pending XR then dc [MK]    Clinical Course User Index [MK] Teressa Lower, MD                           Medical Decision Making Amount and/or Complexity of Data Reviewed Radiology: ordered.  Risk Prescription drug management.   Patient presents today with worsening left arm pain after mechanical fall yesterday.  She is afebrile, nontoxic-appearing, and in no acute distress with reassuring vital signs.  She did have a positive humerus fracture seen on x-ray imaging yesterday and a coaptation splint was placed and she was given a sling with Percocets for management of same.  She states that her pain is unbearable despite taking Percocet as  prescribed.  States she is only taking to 200 mg ibuprofen however.  She does state that all of her pain is in her left forearm, and unfortunately it does not appear that this was adequately captured on the imaging that was performed yesterday.  I do not visualize any deformity and she has good distal sensation and pulses.  However, will repeat imaging of the forearm to ensure that there is not another injury present.  Initially the patient was given IM Dilaudid for management of her pain, however she is still unable to tolerate any movement in her arm and therefore has been unable to get subsequent x-ray imaging.  Will place an IV and give fentanyl and reassess.  Once imaging results, patient will be stable for discharge.  Recommend that we give anti-inflammatories medication prescription for her to go home with to take with Percocets as well as a new sling for her to wear.  Imaging is pending at shift change.  Care handoff to Dr. Matilde Sprang. Please see their note for further evaluation and dispo   Final Clinical Impression(s) / ED Diagnoses Final diagnoses:  None    Rx / DC Orders ED Discharge Orders     None         Nestor Lewandowsky 03/10/22 1429    Malvin Johns, MD 03/12/22 1600

## 2022-03-08 NOTE — Discharge Instructions (Addendum)
You were seen in the emergency department for evaluation of arm pain.  Your pain is almost certainly due to the swelling from your known arm fracture.  It appears that your pain is out of control and we will cautiously increase her pain regimen until you can follow-up with orthopedics.  I will send an additional prescription for oxycodone, but I will also send you with Narcan and need to be very careful about increasing your dose.  You also need to wear your sling and brace as is comfortable.. Follow-up with ortho for further management of your fracture.   Return if development of any new or worsening symptoms

## 2022-03-08 NOTE — ED Triage Notes (Signed)
POV from home. Was here yesterday  and diagnosed with left humerus fracture. Says that her pain is worse now and is unable to control it with the percocet prescribed.  10/10 Took last pain med at 6pm

## 2022-03-09 ENCOUNTER — Telehealth: Payer: Self-pay

## 2022-03-09 ENCOUNTER — Emergency Department (HOSPITAL_COMMUNITY): Payer: Self-pay

## 2022-03-09 MED ORDER — OXYCODONE HCL 5 MG PO TABS: 5.0000 mg | ORAL_TABLET | ORAL | 0 refills | Status: DC | PRN

## 2022-03-09 MED ORDER — NALOXONE HCL 4 MG/0.1ML NA LIQD: NASAL | 2 refills | Status: DC

## 2022-03-09 NOTE — Telephone Encounter (Signed)
Called patient back to schedule her with Dr. Aline Brochure, she stated that she called her work and they want her to go see their doctor today. Stated she would call back if she needs to.

## 2022-03-09 NOTE — Telephone Encounter (Signed)
Patient called wanting to set up an appointment with Dr. Aline Brochure. Stated it was work related, that she had fell at work and broke her arm. Stated she had been to the ER and they referred her to Dr. Aline Brochure. I explained to her that we would need to have the workers comp people  contact us to give [place] the information we need in order to schedule her. 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 Stated she would call work and see what they will do.

## 2022-03-11 ENCOUNTER — Encounter (INDEPENDENT_AMBULATORY_CARE_PROVIDER_SITE_OTHER): Payer: Self-pay

## 2022-03-23 ENCOUNTER — Telehealth: Payer: Self-pay | Admitting: Pulmonary Disease

## 2022-03-23 MED ORDER — PREDNISONE 10 MG PO TABS: ORAL_TABLET | ORAL | 0 refills | Status: DC

## 2022-03-23 NOTE — Telephone Encounter (Signed)
Called and spoke to pt. Pt last seen on 7/28 and advised to take Advair 250/50. Pt was unable to pick up/afford script as she has been out of work due to a broken arm. Pt has been taking Duonebs but since Friday 8/18 she has been exceeding QID. Pt c/o dry cough, wheezing, chest heaviness, ShOB  - since Friday 8/18. Pt states she had a temp of 99.6 on 8/18 but has not had a fever since. Pt states she is unable to get good rest at night due to the cough. She is only able to cough up mucus after neb treatment and mucus color is tan. Pt denies the feeling of significant chest congestion, dizziness, headache. Pt completed the pred taper that was prescribed on 7/14 by CY but there wasn't an additional pred script that was sent to pharmacy for pt to take like advised at last OV.  Instructions from last OV are below. Will forward to Dr. Loanne Drilling to advise on the how many days to write for pred taper as listed in last OV note. Thanks!    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 If you symptoms do not improve in 24-48 hours, start steroid pack that has been prescribed. Please call our office for evaluation and to let us know you steroid the steroid pack (60,50,40,30,20,10 pack).   Follow-up with me in 3 months

## 2022-03-23 NOTE — Telephone Encounter (Signed)
Prednisone taper ordered. Patient needs to be on on her Advair inhaler. Please encourage her to pick up if she can find the resources.  Please check samples and offer to patient if available:  Trelegy 200 mcg ONE puff ONCE a day    OR  Breo 200 mcg ONE puff ONCE a day

## 2022-03-24 MED ORDER — TRELEGY ELLIPTA 200-62.5-25 MCG/ACT IN AEPB: 1.0000 | INHALATION_SPRAY | Freq: Every day | RESPIRATORY_TRACT | 0 refills | Status: DC

## 2022-03-24 NOTE — Telephone Encounter (Signed)
Called and spoke with pt letting her know info per JE. Pt said she did pick up prednisone yesterday. Pt is unable to get her Advair yet due to waiting for worker's comp to kick in as she fell at work and broke her arm.  Stated to pt that we would place samples of Trelegy 200 up front for her or someone to come by to pick up and she verbalized understanding. Samples placed up front for pt. Nothing further needed.

## 2022-04-13 ENCOUNTER — Ambulatory Visit: Payer: Medicaid Other | Admitting: Physician Assistant

## 2022-04-13 ENCOUNTER — Encounter: Payer: Self-pay | Admitting: Physician Assistant

## 2022-04-13 VITALS — BP 114/68 | HR 95 | Temp 98.4°F

## 2022-04-13 DIAGNOSIS — Z1239 Encounter for other screening for malignant neoplasm of breast: Secondary | ICD-10-CM

## 2022-04-13 DIAGNOSIS — N3090 Cystitis, unspecified without hematuria: Secondary | ICD-10-CM

## 2022-04-13 DIAGNOSIS — J45909 Unspecified asthma, uncomplicated: Secondary | ICD-10-CM

## 2022-04-13 DIAGNOSIS — R8290 Unspecified abnormal findings in urine: Secondary | ICD-10-CM

## 2022-04-13 LAB — POCT URINALYSIS DIPSTICK
Bilirubin, UA: NEGATIVE
Glucose, UA: NEGATIVE
Ketones, UA: NEGATIVE
Leukocytes, UA: NEGATIVE
Nitrite, UA: NEGATIVE
Protein, UA: NEGATIVE
Spec Grav, UA: 1.025 (ref 1.010–1.025)
Urobilinogen, UA: 0.2 E.U./dL
pH, UA: 5.5 (ref 5.0–8.0)

## 2022-04-13 MED ORDER — CEPHALEXIN 500 MG PO CAPS: 500.0000 mg | ORAL_CAPSULE | Freq: Four times a day (QID) | ORAL | 0 refills | Status: DC

## 2022-04-13 NOTE — Progress Notes (Signed)
BP 114/68   Pulse 95   Temp 98.4 F (36.9 C)   SpO2 98%    Subjective:    Patient ID: Linda Ellis, female    DOB: 1976-01-20, 46 y.o.   MRN: [id number]  HPI: Linda Ellis is a 46 y.o. female presenting on 04/13/2022 for No chief complaint on file.   HPI  Pt is 27yoF who presents with urinary complaints.    Urine problems started last Wednesday.  Left lower back pain and radiatang to the side.  Urine odor.    LMP 2 wk ago  No abd pain.  No emesis.   Some diarrhea Friday but that was only day.  She broker her am ealy august and is followed by ortho for this.   She continues to see pulmonologist for her severe asthma.  She is still working at [employer] in [place] and is expecting to get insurance first of the year.        Relevant past medical, surgical, family and social history reviewed and updated as indicated. Interim medical history since our last visit reviewed. Allergies and medications reviewed and updated.   Current Outpatient Medications:    albuterol (VENTOLIN HFA) 108 (90 Base) MCG/ACT inhaler, INHALE 1 TO 2 PUFFS BY MOUTH EVERY 6 HOURS AS NEEDED FOR COUGHING OR WHEEZING OR SHORTNESS OF BREATH, Disp: 6.7 g, Rfl: 3   alendronate (FOSAMAX) 70 MG tablet, Take 1 tablet (70 mg total) by mouth once a week. Take with a full glass of water on an empty stomach., Disp: 12 tablet, Rfl: 3   Calcium Carb-Cholecalciferol 500-600 MG-UNIT TABS, Take 1 tablet by mouth 2 (two) times daily with a meal., Disp: 180 tablet, Rfl: 1   Cholecalciferol (VITAMIN D3) 50 MCG (2000 UT) TABS, Take 2 tablets by mouth daily with breakfast., Disp: , Rfl:    famotidine (PEPCID) 20 MG tablet, Take 20 mg by mouth 2 (two) times daily., Disp: , Rfl:    fluticasone (FLONASE) 50 MCG/ACT nasal spray, Place 1 spray into both nostrils 2 (two) times daily., Disp: 48 mL, Rfl: 3   Fluticasone-Umeclidin-Vilant (TRELEGY ELLIPTA) 200-62.5-25 MCG/ACT AEPB, Inhale 1 puff into the lungs daily.,  Disp: 14 each, Rfl: 0   ipratropium-albuterol (DUONEB) 0.5-2.5 (3) MG/3ML SOLN, INHALE 1 VIAL VIA NEBULIZER EVERY 6 HOURS AS NEEDED, Disp: 360 mL, Rfl: 1   loratadine (CLARITIN) 10 MG tablet, TAKE 1 Tablet BY MOUTH ONCE EVERY DAY AS NEEDED, Disp: 90 tablet, Rfl: 1   montelukast (SINGULAIR) 10 MG tablet, TAKE 1 Tablet BY MOUTH ONCE EVERY NIGHT AT BEDTIME, Disp: 90 tablet, Rfl: 3   omeprazole (PRILOSEC) 40 MG capsule, TAKE 1 Capsule BY MOUTH ONCE EVERY DAY, Disp: 90 capsule, Rfl: 1   Simethicone (GAS-X PO), Take by mouth., Disp: , Rfl:    Dupilumab (DUPIXENT) 300 MG/2ML SOPN, 300 mg by Subconjunctival route every 14 (fourteen) days. (Patient not taking: Reported on 02/27/2022), Disp: 12 mL, Rfl: 0   fluticasone-salmeterol (ADVAIR DISKUS) 250-50 MCG/ACT AEPB, Inhale 1 puff into the lungs in the morning and at bedtime. (Patient not taking: Reported on 04/13/2022), Disp: 60 each, Rfl: 5   ibuprofen (ADVIL) 600 MG tablet, Take 1 tablet (600 mg total) by mouth every 6 (six) hours as needed. (Patient not taking: Reported on 04/13/2022), Disp: 30 tablet, Rfl: 0   naloxone (NARCAN) nasal spray 4 mg/0.1 mL, Give one application into each nostril if signs of opioid overdose (Patient not taking: Reported on 04/13/2022), Disp: 1  each, Rfl: 2   oxyCODONE (ROXICODONE) 5 MG immediate release tablet, Take 1 tablet (5 mg total) by mouth every 4 (four) hours as needed for severe pain. (Patient not taking: Reported on 04/13/2022), Disp: 14 tablet, Rfl: 0   oxyCODONE-acetaminophen (PERCOCET/ROXICET) 5-325 MG tablet, Take 2 tablets by mouth every 6 (six) hours as needed for severe pain. (Patient not taking: Reported on 04/13/2022), Disp: 16 tablet, Rfl: 0   predniSONE (DELTASONE) 10 MG tablet, Take 6 tablets x three days (60 mg), followed by 5 tablets x three days ('50mg'$ ), then 4 tablets x three day('40mg'$ ), then 3 tablets ('30mg'$ ) x three days, then 2 tablets ('20mg'$ ) x three days, then 1 tablet ('10mg'$ ) x three days, then STOP (Patient not  taking: Reported on 04/13/2022), Disp: 60 tablet, Rfl: 0    Review of Systems  Per HPI unless specifically indicated above     Objective:    BP 114/68   Pulse 95   Temp 98.4 F (36.9 C)   SpO2 98%   Wt Readings from Last 3 Encounters:  03/08/22 (!) 311 lb 1.6 oz (141.1 kg)  03/07/22 (!) 311 lb 1.6 oz (141.1 kg)  02/27/22 (!) 310 lb 6.4 oz (140.8 kg)    Physical Exam Vitals reviewed.  Constitutional:      General: She is not in acute distress.    Appearance: She is well-developed. She is obese. She is not toxic-appearing.  HENT:     Head: Normocephalic and atraumatic.  Cardiovascular:     Rate and Rhythm: Normal rate and regular rhythm.  Pulmonary:     Effort: Pulmonary effort is normal.     Breath sounds: Normal breath sounds.  Abdominal:     General: Bowel sounds are normal.     Palpations: Abdomen is soft. There is no mass.     Tenderness: There is no abdominal tenderness. There is no right CVA tenderness or left CVA tenderness.  Musculoskeletal:     Cervical back: Neck supple.  Lymphadenopathy:     Cervical: No cervical adenopathy.  Skin:    General: Skin is warm and dry.  Neurological:     Mental Status: She is alert and oriented to person, place, and time.  Psychiatric:        Behavior: Behavior normal.           Assessment & Plan:   Encounter Diagnoses  Name Primary?   Cystitis Yes   Abnormal urine odor    Asthma, unspecified asthma severity, unspecified whether complicated, unspecified whether persistent    Morbid obesity ([place])    Encounter for screening for malignant neoplasm of breast, unspecified screening modality      -rx keflex and plenty of water for her urine. -Pt to call gyn for pap- she has fp medicaid -No labs needed at this time -refer for screening Mammogram which is due November -pt will be getting Insurance first of year.  She is scheduled to f/u in 6 months.  She can cancel if she gets the insurance.   She is to contact  office sooner prn

## 2022-04-14 ENCOUNTER — Telehealth: Payer: Self-pay | Admitting: Student

## 2022-04-14 NOTE — Telephone Encounter (Signed)
Pt called requesting rx for prednisone. Pt states she "can't breathe" and has attempted contacting pulmonologist yesterday and this morning with no call back.  After reviewing pt's chart: pulmonologist rx'd prednisone on 03-24-22 and was given a taper dose for 18 days; which means pt would have finished this on 04-10-22 ( 4 days ago).  PA recommends for pt to continue trying to contact pulmonologist specialist as she does not feel comfortable prescribing prednisone days after being on it.  Pt states "I'll just got to the hospital since no one wants to help me" and hung up in the middle of nurse trying to explain to her it would be best to contact specialist.

## 2022-04-17 ENCOUNTER — Telehealth: Payer: Self-pay

## 2022-04-17 NOTE — Telephone Encounter (Signed)
Telephoned patient at mobile number. Left a voice message with [employer] (scholarship) contact information. 

## 2022-04-20 ENCOUNTER — Other Ambulatory Visit: Payer: Self-pay | Admitting: "Endocrinology

## 2022-04-20 ENCOUNTER — Telehealth: Payer: Self-pay | Admitting: Pulmonary Disease

## 2022-04-20 ENCOUNTER — Other Ambulatory Visit: Payer: Self-pay | Admitting: Physician Assistant

## 2022-04-20 DIAGNOSIS — J4551 Severe persistent asthma with (acute) exacerbation: Secondary | ICD-10-CM

## 2022-04-20 DIAGNOSIS — M816 Localized osteoporosis [Lequesne]: Secondary | ICD-10-CM

## 2022-04-20 DIAGNOSIS — J4550 Severe persistent asthma, uncomplicated: Secondary | ICD-10-CM

## 2022-04-20 NOTE — Telephone Encounter (Signed)
Spoke with the pt and scheduled appt with [facility] for her increased SOB Nothing further needed

## 2022-04-20 NOTE — Telephone Encounter (Signed)
Patient states she is having a hard time breathing. Was given prednisone a month ago and never really felt better after finishing it. Patient has been out of work since 8/5 and wonders if it is due to not moving as much. Patient uses [facility] on [address]. Denied appointment, wants to see what JE says first and will come in if needed.

## 2022-04-22 ENCOUNTER — Ambulatory Visit: Payer: Medicaid Other | Admitting: Physician Assistant

## 2022-04-22 ENCOUNTER — Ambulatory Visit: Payer: Medicaid Other | Admitting: Primary Care

## 2022-04-27 ENCOUNTER — Emergency Department (HOSPITAL_COMMUNITY): Payer: Self-pay

## 2022-04-27 ENCOUNTER — Telehealth: Payer: Self-pay | Admitting: Pulmonary Disease

## 2022-04-27 ENCOUNTER — Other Ambulatory Visit: Payer: Self-pay

## 2022-04-27 ENCOUNTER — Encounter (HOSPITAL_COMMUNITY): Payer: Self-pay | Admitting: Emergency Medicine

## 2022-04-27 ENCOUNTER — Emergency Department (HOSPITAL_COMMUNITY)
Admission: EM | Admit: 2022-04-27 | Discharge: 2022-04-27 | Disposition: A | Discharge status: Home / Self Care | Payer: Self-pay | Attending: Student | Admitting: Student

## 2022-04-27 DIAGNOSIS — Z9049 Acquired absence of other specified parts of digestive tract: Secondary | ICD-10-CM | POA: Insufficient documentation

## 2022-04-27 DIAGNOSIS — J441 Chronic obstructive pulmonary disease with (acute) exacerbation: Secondary | ICD-10-CM | POA: Insufficient documentation

## 2022-04-27 DIAGNOSIS — Z87891 Personal history of nicotine dependence: Secondary | ICD-10-CM | POA: Insufficient documentation

## 2022-04-27 DIAGNOSIS — Z20822 Contact with and (suspected) exposure to covid-19: Secondary | ICD-10-CM | POA: Insufficient documentation

## 2022-04-27 DIAGNOSIS — J45909 Unspecified asthma, uncomplicated: Secondary | ICD-10-CM | POA: Insufficient documentation

## 2022-04-27 LAB — CBC WITH DIFFERENTIAL/PLATELET
Abs Immature Granulocytes: 0.03 10*3/uL (ref 0.00–0.07)
Basophils Absolute: 0 10*3/uL (ref 0.0–0.1)
Basophils Relative: 1 %
Eosinophils Absolute: 0.3 10*3/uL (ref 0.0–0.5)
Eosinophils Relative: 3 %
HCT: 37.5 % (ref 36.0–46.0)
Hemoglobin: 12.4 g/dL (ref 12.0–15.0)
Immature Granulocytes: 0 %
Lymphocytes Relative: 35 %
Lymphs Abs: 3.1 10*3/uL (ref 0.7–4.0)
MCH: 30.1 pg (ref 26.0–34.0)
MCHC: 33.1 g/dL (ref 30.0–36.0)
MCV: 91 fL (ref 80.0–100.0)
Monocytes Absolute: 0.5 10*3/uL (ref 0.1–1.0)
Monocytes Relative: 5 %
Neutro Abs: 4.8 10*3/uL (ref 1.7–7.7)
Neutrophils Relative %: 56 %
Platelets: 455 10*3/uL — ABNORMAL HIGH (ref 150–400)
RBC: 4.12 MIL/uL (ref 3.87–5.11)
RDW: 12.6 % (ref 11.5–15.5)
WBC: 8.7 10*3/uL (ref 4.0–10.5)
nRBC: 0 % (ref 0.0–0.2)

## 2022-04-27 LAB — COMPREHENSIVE METABOLIC PANEL
ALT: 40 U/L (ref 0–44)
AST: 32 U/L (ref 15–41)
Albumin: 3.3 g/dL — ABNORMAL LOW (ref 3.5–5.0)
Alkaline Phosphatase: 104 U/L (ref 38–126)
Anion gap: 5 (ref 5–15)
BUN: 6 mg/dL (ref 6–20)
CO2: 23 mmol/L (ref 22–32)
Calcium: 8.4 mg/dL — ABNORMAL LOW (ref 8.9–10.3)
Chloride: 109 mmol/L (ref 98–111)
Creatinine, Ser: 0.65 mg/dL (ref 0.44–1.00)
GFR, Estimated: >60 mL/min (ref >60)
Glucose, Bld: 121 mg/dL — ABNORMAL HIGH (ref 70–99)
Potassium: 3.8 mmol/L (ref 3.5–5.1)
Sodium: 137 mmol/L (ref 135–145)
Total Bilirubin: 0.5 mg/dL (ref 0.3–1.2)
Total Protein: 6.8 g/dL (ref 6.5–8.1)

## 2022-04-27 LAB — BLOOD GAS, VENOUS
Acid-Base Excess: 3.9 mmol/L — ABNORMAL HIGH (ref 0.0–2.0)
Bicarbonate: 27.7 mmol/L (ref 20.0–28.0)
Drawn by: 66460
O2 Saturation: 89.5 %
Patient temperature: 37.1
pCO2, Ven: 38 mmHg — ABNORMAL LOW (ref 44–60)
pH, Ven: 7.47 — ABNORMAL HIGH (ref 7.25–7.43)
pO2, Ven: 58 mmHg — ABNORMAL HIGH (ref 32–45)

## 2022-04-27 LAB — BRAIN NATRIURETIC PEPTIDE: B Natriuretic Peptide: 28 pg/mL (ref 0.0–100.0)

## 2022-04-27 LAB — TROPONIN I (HIGH SENSITIVITY)
Troponin I (High Sensitivity): <2 ng/L (ref <18)
Troponin I (High Sensitivity): <2 ng/L (ref <18)

## 2022-04-27 LAB — RESP PANEL BY RT-PCR (FLU A&B, COVID) ARPGX2
Influenza A by PCR: NEGATIVE
Influenza B by PCR: NEGATIVE
SARS Coronavirus 2 by RT PCR: NEGATIVE

## 2022-04-27 MED ORDER — PREDNISONE 10 MG PO TABS: 40.0000 mg | ORAL_TABLET | Freq: Every day | ORAL | 0 refills | Status: AC

## 2022-04-27 MED ORDER — IPRATROPIUM-ALBUTEROL 0.5-2.5 (3) MG/3ML IN SOLN
RESPIRATORY_TRACT | Status: AC
  Filled 2022-04-27: qty 6

## 2022-04-27 MED ORDER — ALBUTEROL SULFATE (2.5 MG/3ML) 0.083% IN NEBU
INHALATION_SOLUTION | RESPIRATORY_TRACT | Status: AC
  Filled 2022-04-27: qty 12

## 2022-04-27 MED ORDER — IPRATROPIUM-ALBUTEROL 0.5-2.5 (3) MG/3ML IN SOLN
9.0000 mL | Freq: Once | RESPIRATORY_TRACT | Status: AC
  Administered 2022-04-27: 9 mL via RESPIRATORY_TRACT
  Filled 2022-04-27: qty 3

## 2022-04-27 MED ORDER — METHYLPREDNISOLONE SODIUM SUCC 125 MG IJ SOLR
125.0000 mg | Freq: Once | INTRAMUSCULAR | Status: AC
  Administered 2022-04-27: 125 mg via INTRAVENOUS
  Filled 2022-04-27: qty 2

## 2022-04-27 MED ORDER — MAGNESIUM SULFATE 2 GM/50ML IV SOLN
2.0000 g | Freq: Once | INTRAVENOUS | Status: AC
  Administered 2022-04-27: 2 g via INTRAVENOUS
  Filled 2022-04-27: qty 50

## 2022-04-27 MED ORDER — ALBUTEROL (5 MG/ML) CONTINUOUS INHALATION SOLN
10.0000 mg/h | INHALATION_SOLUTION | Freq: Once | RESPIRATORY_TRACT | Status: AC
  Administered 2022-04-27: 10 mg/h via RESPIRATORY_TRACT
  Filled 2022-04-27: qty 20

## 2022-04-27 MED ORDER — IOHEXOL 300 MG/ML  SOLN
80.0000 mL | Freq: Once | INTRAMUSCULAR | Status: AC | PRN
  Administered 2022-04-27: 80 mL via INTRAVENOUS

## 2022-04-27 NOTE — ED Notes (Signed)
This RN assumed care @ this time, introduced myself, breathing tx in process @ this time, call light w/in reach.

## 2022-04-27 NOTE — ED Provider Notes (Signed)
[facility] EMERGENCY DEPARTMENT Provider Note  CSN: [id number] Arrival date & time: 04/27/22 0747  Chief Complaint(s) Chest Pain  HPI Linda Ellis is a 46 y.o. female with PMH COPD, gastric ulcers, GERD, obesity who presents emergency department for evaluation of chest pain and shortness of breath.  Patient states that over the last 24 hours she has developed worsening chest tightness, cough and fever with associated chills.  She arrives with tachypnea and wheezing.  She states that she took multiple nebulizer treatments at home prior to arrival as well as Tylenol prior to arrival.  Denies abdominal pain, nausea, vomiting, diarrhea, headache or other systemic symptoms.   Past Medical History Past Medical History:  Diagnosis Date   Asthma 2008   COPD (chronic obstructive pulmonary disease) (HCC)    GERD (gastroesophageal reflux disease)    Multiple gastric ulcers    Osteoporosis    Pneumonia    Patient Active Problem List   Diagnosis Date Noted   Vitamin D deficiency 07/08/2020   Prediabetes 06/25/2020   Localized osteoporosis without current pathological fracture 06/24/2020   Allergic rhinitis 12/07/2019   Morbid obesity ([place]) 10/09/2019   Former smoker 09/25/2019   Medication management 09/25/2019   Severe persistent asthma dependent on systemic steroids with acute exacerbation 07/30/2018   Gastroesophageal reflux disease    Home Medication(s) Prior to Admission medications   Medication Sig Start Date End Date Taking? Authorizing Provider  albuterol (VENTOLIN HFA) 108 (90 Base) MCG/ACT inhaler INHALE 1 TO 2 PUFFS BY MOUTH EVERY 6 HOURS AS NEEDED FOR COUGHING, WHEEZING, OR SHORTNESS OF BREATH Patient taking differently: Inhale 1-2 puffs into the lungs every 6 (six) hours as needed for wheezing or shortness of breath. 04/20/22  Yes [name], PA-C  alendronate (FOSAMAX) 70 MG tablet TAKE 1 TAB BY MOUTH ONCE WEEKLY IN MORNING WITH FULL GLASS OF WATER ON EMPTY STOMACH. DO  NOT EAT OR LIE DOWN FOR NEXT 30 MINUTES Patient taking differently: Take 70 mg by mouth once a week. 04/21/22  Yes Cassandria Anger, MD  Calcium Carb-Cholecalciferol 500-600 MG-UNIT TABS Take 1 tablet by mouth 2 (two) times daily with a meal. 07/08/20  Yes Nida, Marella Chimes, MD  Cholecalciferol (VITAMIN D3) 50 MCG (2000 UT) TABS Take 2 tablets by mouth daily with breakfast.   Yes [provider]  famotidine (PEPCID) 20 MG tablet Take 20 mg by mouth 2 (two) times daily.   Yes [provider]  fluticasone (FLONASE) 50 MCG/ACT nasal spray USE 1 SPRAY IN EACH NOSTRIL TWICE DAILY Patient taking differently: Place 1 spray into both nostrils daily. 04/20/22  Yes [name], PA-C  ipratropium-albuterol (DUONEB) 0.5-2.5 (3) MG/3ML SOLN INHALE 1 VIAL VIA NEBULIZER EVERY 6 HOURS AS NEEDED Patient taking differently: Take 3 mLs by nebulization every 6 (six) hours as needed (sob/wheezing). 04/20/22  Yes [name], PA-C  loratadine (CLARITIN) 10 MG tablet TAKE 1 Tablet BY MOUTH ONCE EVERY DAY AS NEEDED Patient taking differently: Take 10 mg by mouth daily as needed for allergies. 04/20/22  Yes [name], PA-C  montelukast (SINGULAIR) 10 MG tablet TAKE 1 Tablet BY MOUTH ONCE EVERY NIGHT AT BEDTIME Patient taking differently: Take 10 mg by mouth at bedtime. 10/23/21  Yes Margaretha Seeds, MD  omeprazole (PRILOSEC) 40 MG capsule TAKE 1 Capsule BY MOUTH ONCE EVERY DAY Patient taking differently: Take 40 mg by mouth daily. 08/25/21  Yes [name], PA-C  predniSONE (DELTASONE) 10 MG tablet Take 4 tablets (40 mg total) by  mouth daily for 4 days. 04/27/22 05/01/22 Yes Amiya Escamilla, MD  Fluticasone-Umeclidin-Vilant (TRELEGY ELLIPTA) 200-62.5-25 MCG/ACT AEPB Inhale 1 puff into the lungs daily. Patient not taking: Reported on 04/27/2022 03/24/22   Margaretha Seeds, MD  ALBUTEROL IN Inhale 2 puffs into the lungs as needed. For shortness of breath   10/24/11  [provider]                                                                                                                                    Past Surgical History Past Surgical History:  Procedure Laterality Date   CHOLECYSTECTOMY     DIAGNOSTIC LAPAROSCOPY WITH REMOVAL OF ECTOPIC PREGNANCY Right 01/24/2017   Procedure: REMOVAL OF ECTOPIC PREGNANCY;  Surgeon: Florian Buff, MD;  Location: [facility];  Service: Gynecology;  Laterality: Right;   LAPAROSCOPIC UNILATERAL SALPINGECTOMY Right 01/24/2017   Procedure: LAPAROSCOPIC UNILATERAL SALPINGECTOMY;  Surgeon: Florian Buff, MD;  Location: [facility];  Service: Gynecology;  Laterality: Right;   Family History Family History  Problem Relation Age of Onset   Asthma Mother    COPD Mother    Hypertension Father    Hyperlipidemia Father    Asthma Father    COPD Father    Breast cancer Maternal Grandmother    Diabetes Maternal Grandfather    Heart attack Maternal Grandfather    Stroke Maternal Grandfather    Hypertension Maternal Grandfather     Social History Social History   Tobacco Use   Smoking status: Former    Packs/day: 1.00    Years: 31.00    Total pack years: 31.00    Types: Cigarettes    Start date: 1989    Quit date: 02/24/2019    Years since quitting: 3.1   Smokeless tobacco: Never  Vaping Use   Vaping Use: Never used  Substance Use Topics   Alcohol use: Yes    Comment: occasionally   Drug use: No   Allergies Patient has no known allergies.  Review of Systems Review of Systems  Constitutional:  Positive for chills and fever.  Respiratory:  Positive for cough, chest tightness, shortness of breath and wheezing.     Physical Exam Vital Signs  I have reviewed the triage vital signs BP 136/81 (BP Location: Right Wrist)   Pulse 94   Temp 98.1 F (36.7 C) (Oral)   Resp 18   Ht '5\' 6"'$  (1.676 m)   Wt (!) 141.1 kg   LMP 04/27/2022 (Exact Date)   SpO2 96%   BMI 50.21 kg/m   Physical Exam Vitals and nursing  note reviewed.  Constitutional:      General: She is in acute distress.     Appearance: She is well-developed. She is ill-appearing.  HENT:     Head: Normocephalic and atraumatic.  Eyes:     Conjunctiva/sclera: Conjunctivae normal.  Cardiovascular:     Rate and Rhythm: Normal rate  and regular rhythm.     Heart sounds: No murmur heard. Pulmonary:     Effort: Tachypnea and respiratory distress present.     Breath sounds: Wheezing present.  Abdominal:     Palpations: Abdomen is soft.     Tenderness: There is no abdominal tenderness.  Musculoskeletal:        General: No swelling.     Cervical back: Neck supple.  Skin:    General: Skin is warm and dry.     Capillary Refill: Capillary refill takes less than 2 seconds.  Neurological:     Mental Status: She is alert.  Psychiatric:        Mood and Affect: Mood normal.     ED Results and Treatments Labs (all labs ordered are listed, but only abnormal results are displayed) Labs Reviewed  COMPREHENSIVE METABOLIC PANEL - Abnormal; Notable for the following components:      Result Value   Glucose, Bld 121 (*)    Calcium 8.4 (*)    Albumin 3.3 (*)    All other components within normal limits  CBC WITH DIFFERENTIAL/PLATELET - Abnormal; Notable for the following components:   Platelets 455 (*)    All other components within normal limits  BLOOD GAS, VENOUS - Abnormal; Notable for the following components:   pH, Ven 7.47 (*)    pCO2, Ven 38 (*)    pO2, Ven 58 (*)    Acid-Base Excess 3.9 (*)    All other components within normal limits  RESP PANEL BY RT-PCR (FLU A&B, COVID) ARPGX2  BRAIN NATRIURETIC PEPTIDE  TROPONIN I (HIGH SENSITIVITY)  TROPONIN I (HIGH SENSITIVITY)                                                                                                                          Radiology CT Chest W Contrast  Result Date: 04/27/2022 CLINICAL DATA:  Pneumonia cough, congestion EXAM: CT CHEST WITH CONTRAST TECHNIQUE:  Multidetector CT imaging of the chest was performed during intravenous contrast administration. RADIATION DOSE REDUCTION: This exam was performed according to the departmental dose-optimization program which includes automated exposure control, adjustment of the mA and/or kV according to patient size and/or use of iterative reconstruction technique. CONTRAST:  86m OMNIPAQUE IOHEXOL 300 MG/ML  SOLN COMPARISON:  CT abdomen pelvis, 09/13/2019 FINDINGS: Cardiovascular: No significant vascular findings. Normal heart size. No pericardial effusion. Unchanged, benign pericardial cyst adjacent to the right atrium measuring 3.9 x 2.1 cm, no further follow-up or characterization required. (Series 2, image 90). Mediastinum/Nodes: No enlarged mediastinal, hilar, or axillary lymph nodes. Thyroid gland, trachea, and esophagus demonstrate no significant findings. Lungs/Pleura: Irregular, scattered ground-glass opacity of the upper lobes and apices (series 4, image 30). Bandlike scarring or atelectasis of the bilateral lung bases. No pleural effusion or pneumothorax. Upper Abdomen: No acute abnormality. Partially imaged, definitively benign fat containing right adrenal adenoma, for which no further follow-up or characterization is required. Status post cholecystectomy. Musculoskeletal: No chest wall abnormality.  No acute osseous findings. IMPRESSION: Irregular, scattered ground-glass opacity of the upper lobes and lung apices, nonspecific and infectious or inflammatory. Electronically Signed   By: Delanna Ahmadi M.D.   On: 04/27/2022 13:44   DG Chest Portable 1 View  Result Date: 04/27/2022 CLINICAL DATA:  Cough, congestion, shortness of breath and fever since yesterday question pneumonia EXAM: PORTABLE CHEST 1 VIEW COMPARISON:  Portable exam 0824 hours compared to 02/06/2021 FINDINGS: Upper normal size of cardiac silhouette. Mediastinal contours and pulmonary vascularity normal. Lungs grossly clear. No definite infiltrate,  pleural effusion, or pneumothorax. Osseous structures unremarkable. IMPRESSION: No acute abnormalities. Electronically Signed   By: Lavonia Dana M.D.   On: 04/27/2022 08:31    Pertinent labs & imaging results that were available during my care of the patient were reviewed by me and considered in my medical decision making (see MDM for details).  Medications Ordered in ED Medications  albuterol (PROVENTIL) (2.5 MG/3ML) 0.083% nebulizer solution (  Total Dose 04/27/22 1116)  ipratropium-albuterol (DUONEB) 0.5-2.5 (3) MG/3ML nebulizer solution 9 mL (9 mLs Nebulization Given 04/27/22 0827)  magnesium sulfate IVPB 2 g 50 mL (0 g Intravenous Stopped 04/27/22 0937)  methylPREDNISolone sodium succinate (SOLU-MEDROL) 125 mg/2 mL injection 125 mg (125 mg Intravenous Given 04/27/22 0834)  ipratropium-albuterol (DUONEB) 0.5-2.5 (3) MG/3ML nebulizer solution (  Given 04/27/22 0835)  albuterol (PROVENTIL,VENTOLIN) solution continuous neb (10 mg/hr Nebulization Given 04/27/22 1051)  iohexol (OMNIPAQUE) 300 MG/ML solution 80 mL (80 mLs Intravenous Contrast Given 04/27/22 1327)                                                                                                                                     Procedures .Critical Care  Performed by: Teressa Lower, MD Authorized by: Teressa Lower, MD   Critical care provider statement:    Critical care time (minutes):  30   Critical care was necessary to treat or prevent imminent or life-threatening deterioration of the following conditions:  Respiratory failure   Critical care was time spent personally by me on the following activities:  Development of treatment plan with patient or surrogate, discussions with consultants, evaluation of patient's response to treatment, examination of patient, ordering and review of laboratory studies, ordering and review of radiographic studies, ordering and performing treatments and interventions, pulse oximetry, re-evaluation of  patient's condition and review of old charts   (including critical care time)  Medical Decision Making / ED Course   This patient presents to the ED for concern of dyspnea, this involves an extensive number of treatment options, and is a complaint that carries with it a high risk of complications and morbidity.  The differential diagnosis includes COPD exacerbation, asthma exacerbation, pneumonia, PE  MDM: Patient seen in the emergency room for evaluation of shortness of breath.  Physical exam reveals a tachypneic patient with end-expiratory wheezing bilaterally.  Laboratory evaluation largely unremarkable including negative troponin and delta troponin.  Patient received 3 back-to-back DuoNebs, methylprednisolone as well as magnesium and on reevaluation her symptoms have improved but wheezing is still persistent.  Chest x-ray unremarkable.  Given patient's fever and chills, I obtained a CT chest to rule out pneumonia that shows some mild inflammatory change but no consolidative pneumonia.  She was given an hour-long albuterol treatment and on reevaluation her symptoms have dramatically improved.  She remains saturating 96% on room air on third reevaluation and at this time appears safe for discharge with close outpatient follow-up and oral steroids.  She was discharged with a prescription for prednisone and strict return precautions of which she voiced understanding.   Additional history obtained: -Additional history obtained from family member -External records from outside source obtained and reviewed including: Chart review including previous notes, labs, imaging, consultation notes   Lab Tests: -I ordered, reviewed, and interpreted labs.   The pertinent results include:   Labs Reviewed  COMPREHENSIVE METABOLIC PANEL - Abnormal; Notable for the following components:      Result Value   Glucose, Bld 121 (*)    Calcium 8.4 (*)    Albumin 3.3 (*)    All other components within normal  limits  CBC WITH DIFFERENTIAL/PLATELET - Abnormal; Notable for the following components:   Platelets 455 (*)    All other components within normal limits  BLOOD GAS, VENOUS - Abnormal; Notable for the following components:   pH, Ven 7.47 (*)    pCO2, Ven 38 (*)    pO2, Ven 58 (*)    Acid-Base Excess 3.9 (*)    All other components within normal limits  RESP PANEL BY RT-PCR (FLU A&B, COVID) ARPGX2  BRAIN NATRIURETIC PEPTIDE  TROPONIN I (HIGH SENSITIVITY)  TROPONIN I (HIGH SENSITIVITY)      EKG   EKG Interpretation  Date/Time:  Monday April 27 2022 08:12:28 EDT Ventricular Rate:  102 PR Interval:  151 QRS Duration: 101 QT Interval:  362 QTC Calculation: 472 R Axis:   55 Text Interpretation: Sinus tachycardia Consider left atrial enlargement Confirmed by [place] ([id number]) on 04/27/2022 3:35:12 PM         Imaging Studies ordered: I ordered imaging studies including chest x-ray, CT chest I independently visualized and interpreted imaging. I agree with the radiologist interpretation   Medicines ordered and prescription drug management: Meds ordered this encounter  Medications   ipratropium-albuterol (DUONEB) 0.5-2.5 (3) MG/3ML nebulizer solution 9 mL   magnesium sulfate IVPB 2 g 50 mL   methylPREDNISolone sodium succinate (SOLU-MEDROL) 125 mg/2 mL injection 125 mg   ipratropium-albuterol (DUONEB) 0.5-2.5 (3) MG/3ML nebulizer solution    Cordelia Pen C: cabinet override   albuterol (PROVENTIL,VENTOLIN) solution continuous neb   albuterol (PROVENTIL) (2.5 MG/3ML) 0.083% nebulizer solution    Cordelia Pen C: cabinet override   iohexol (OMNIPAQUE) 300 MG/ML solution 80 mL   predniSONE (DELTASONE) 10 MG tablet    Sig: Take 4 tablets (40 mg total) by mouth daily for 4 days.    Dispense:  15 tablet    Refill:  0    -I have reviewed the patients home medicines and have made adjustments as needed  Critical interventions Multiple DuoNebs, continuous albuterol,  magnesium   Cardiac Monitoring: The patient was maintained on a cardiac monitor.  I personally viewed and interpreted the cardiac monitored which showed an underlying rhythm of: Sinus tachycardia, NSR  Social Determinants of Health:  Factors impacting patients care include: none   Reevaluation: After the interventions noted above, I  reevaluated the patient and found that they have :improved  Co morbidities that complicate the patient evaluation  Past Medical History:  Diagnosis Date   Asthma 2008   COPD (chronic obstructive pulmonary disease) (HCC)    GERD (gastroesophageal reflux disease)    Multiple gastric ulcers    Osteoporosis    Pneumonia       Dispostion: I considered admission for this patient, but given significant improvement of symptoms after ER interventions, patient safe for discharge with outpatient follow-up and close return precautions     Final Clinical Impression(s) / ED Diagnoses Final diagnoses:  COPD exacerbation ([place])     '@PCDICTATION'$ @    Teressa Lower, MD 04/27/22 1535

## 2022-04-27 NOTE — ED Triage Notes (Signed)
Pt to the ED with complaints of chest pressure, cough and fever that began yesterday.  Pt has a history of asthma and COPD.  Pt appears short of breath after walking from the waiting room.

## 2022-04-28 NOTE — Telephone Encounter (Signed)
Patient is returning phone call. Patient phone number is [phone].

## 2022-04-28 NOTE — Telephone Encounter (Signed)
Left message for pt to call back regarding message left.

## 2022-05-01 MED ORDER — PREDNISONE 10 MG PO TABS: ORAL_TABLET | ORAL | 0 refills | Status: DC

## 2022-05-01 NOTE — Telephone Encounter (Signed)
Spoke with pt, reviewed prednisone taper instructions and scheduled for OV new week with Katie Cobb. Prednisone taper order placed. Nothing further needed at this time.

## 2022-05-01 NOTE — Telephone Encounter (Signed)
Called and spoke with patient. She went to the ED on 04/27/22 for a COPD exacerbation. She was discharged on '40mg'$  of prednisone once daily for 4 days. She finished the prednisone today but feels like she needs more. She is still having SOB and wheezing episodes. She also has a productive cough with tan phlegm. She denied any fevers or body aches. Also denied any covid exposures.   She is still using her Trelegy 262mg and Duonebs. So far today she has used her Duonebs 4 times. She does not believe they are working anymore.   She wanted to know if she could have more prednisone sent in for her. [facility]   SJudson Roch can you please advise Dr. ELoanne Drillingis not available today? Thanks!

## 2022-05-06 ENCOUNTER — Encounter: Payer: Self-pay | Admitting: Nurse Practitioner

## 2022-05-06 ENCOUNTER — Ambulatory Visit (INDEPENDENT_AMBULATORY_CARE_PROVIDER_SITE_OTHER): Payer: Self-pay | Admitting: Nurse Practitioner

## 2022-05-06 ENCOUNTER — Ambulatory Visit (INDEPENDENT_AMBULATORY_CARE_PROVIDER_SITE_OTHER): Payer: Self-pay

## 2022-05-06 VITALS — BP 128/78 | HR 78 | Ht 66.0 in | Wt 316.4 lb

## 2022-05-06 DIAGNOSIS — J4551 Severe persistent asthma with (acute) exacerbation: Secondary | ICD-10-CM

## 2022-05-06 DIAGNOSIS — J441 Chronic obstructive pulmonary disease with (acute) exacerbation: Secondary | ICD-10-CM

## 2022-05-06 MED ORDER — BREZTRI AEROSPHERE 160-9-4.8 MCG/ACT IN AERO: 2.0000 | INHALATION_SPRAY | Freq: Two times a day (BID) | RESPIRATORY_TRACT | 0 refills | Status: DC

## 2022-05-06 NOTE — Assessment & Plan Note (Addendum)
Severe asthma with allergic phenotype; resolving exacerbation today. She has been on multiple biologics in the past; failed [place] and Xolair. She did have improvement on Dupixent with less frequent flares. Unfortunately, she has stopped this as she never renewed her patient assistance. Re-educated on the purpose of biologic therapy today and shared decision to restart. Application completed and message sent to the pharmacy.  She has restarted Judithann Sauger, which she feels like works the best for her. She is aware this is used off label currently for asthma and would like to continue. Provided with samples. She was provided with patient assistance paperwork due to being uninsured.   Patient Instructions  Continue Breztri 2 puffs Twice daily. Brush tongue and rinse mouth afterwards. Complete patient assistance paperwork  Continue Albuterol inhaler 2 puffs or 3 mL neb every 6 hours as needed for shortness of breath or wheezing. Notify if symptoms persist despite rescue inhaler/neb use.  Continue flonase 1 spray each nostril daily Continue Claritin 1 tab daily  Continue Singulair 1 tab At bedtime  Continue omeprazole 1 tab daily   Complete prednisone taper as previously directed  [facility] team will contact you for scheduling   Asthma action plan: START DUONEBS up to four times a day for worsening shortness of breath, wheezing and cough. If you symptoms do not improve in 24-48 hours, start steroid pack that has been prescribed. Please call our office for evaluation and to let us know you steroid the steroid pack (60,50,40,30,20,10 pack).  Follow up in 2 weeks with Dr. Loanne Drilling (1st) or Katie Javaya [place],NP. If symptoms do not improve or worsen, please contact office for sooner follow up or seek emergency care.

## 2022-05-06 NOTE — Progress Notes (Unsigned)
$'@Patient'b$  ID: Linda Ellis, female    DOB: [birth year], 46 y.o.   MRN: [id number]  Chief Complaint  Patient presents with   Follow-up    Pt f/u she is still using neb but it is helping, some SOB but inhalers Judithann Sauger & Al in.) help it. She is still on the prednisone.     Referring provider: [name], PA-C  HPI: 46 year old female, former smoker followed for severe asthma on steroids. She is a patient of Dr. Cordelia Pen and last seen in office 02/27/2022. Past medical history significant for allergic rhinitis, GERD, obesity, prediabetes, vit D deficiency.   TEST/EVENTS:  04/27/2022 CT chest w contrast: unchanged, benign pericardial cyst. Irregular, scattered ground-glass opacity of the upper lobes and apices. Bandlike scarring or atelectasis in b/l lung bases.   02/27/2022: OV with Dr. Loanne Drilling.  Since moving to [place] in 2002, symptoms of dyspnea, cough and wheezing.  Triggered by exertion heat and smoke.  Frequent asthma exacerbations requiring steroid treatment nearly every month of 2019 and 2020 despite being compliant on steroid inhalers.  Currently steroid dependent.  Started on Fasenra in March 2021; failed after 6 months and started on Xolair in December 2021.  She still had poor control so she was switched to Irena in May 2022. She has had three exacerbations since 07/2021, which is actually improved compared to the past springtime exacerbations. Feels dupixent working well for her. No hospitalizations >2 years. Unable to tolerate Spiriva d/t cough. Decreased Advair to one puff twice a day. Asthma action plan: START DUONEBS up to four times a day for worsening shortness of breath, wheezing and cough. If you symptoms do not improve in 24-48 hours, start steroid pack that has been prescribed. Please call our office for evaluation and to let us know you steroid the steroid pack (60,50,40,30,20,10 pack).  05/06/2022: Today - follow up Patient presents today for follow up after being seen in  the ED for asthma exacerbation on 9/25. She had shortness of breath, wheezing, chest tightness, and fevers with chills at home. CT chest showed some mild inflammatory change but no consolidative pneumonia. She was treated with methylprednisone injection and multiple DuoNebs.  She was discharged on a prednisone burst.  She contacted the office the same day because she was worried that this would not work for her.  She was prescribed a nystatin taper to complete in place of the burst and advised to schedule follow-up in our office to see how she is doing.  Today, she reports that she has been feeling much better.  Feels like the prednisone has worked well for her.  Her breathing is back to her baseline.  She has not noticed much wheezing.  She also tells me that she did have a fever prior to being seen in the ED.  They told her that it might be something viral that caused her flareup.  She was not aware of any known sick exposures.  They did test her for COVID and the flu which were both negative.  She denies any recurrence of fevers, chills, chest congestion, hemoptysis, orthopnea, lower extremity swelling.  She is currently on Breztri.  She had some samples at home from when she was seen here last and started on this.  She feels like it has worked better than anything else. She is using her neb once to twice a day. She has been off [place] for a few months now; she doesn't remember exactly when she stopped but thinks  it was early summer. She said she stopped receiving patient assistance for it and needed to renew; she wasn't sure she needed to continue it since she only flares in the fall and spring with allergy seasons.   Steroids Received in 2019 and 2020 2019 Jan Feb March April May June July Aug Sept Oct Nov Dec    X X XX XX   XX X XX XXX   XX XXX  2020 Jan Feb March April May June July Aug Sept Oct Nov Dec    X X XXXX     X XXX    X      XX  2021 Jan Feb March April May June July Aug Sept Oct Nov Dec       X Fasenra XX X X   X XX XX   XX  2022 Jan Feb March April May June July Aug Sept Oct Nov Dec    X Xolair Evans Lance Dupixent Tamala Fothergill   XX    2023 Jan Feb Mar April May June July Aug Sept Oct Nov Dec      X Ulla Potash        2024 Jan Feb Mar April May June July Aug Sept Oct Nov Dec                             2025 Jan Feb Mar April May June July Aug Sept Oct Nov Dec                               No Known Allergies  Immunization History  Administered Date(s) Administered   Influenza,inj,Quad PF,6+ Mos 05/04/2018   PFIZER(Purple Top)SARS-COV-2 Vaccination 11/06/2019, 12/22/2019, 08/21/2020   Pneumococcal Polysaccharide-23 08/08/2020    Past Medical History:  Diagnosis Date   Asthma 2008   COPD (chronic obstructive pulmonary disease) (HCC)    GERD (gastroesophageal reflux disease)    Multiple gastric ulcers    Osteoporosis    Pneumonia     Tobacco History: Social History   Tobacco Use  Smoking Status Former   Packs/day: 1.00   Years: 31.00   Total pack years: 31.00   Types: Cigarettes   Start date: 84   Quit date: 02/24/2019   Years since quitting: 3.1  Smokeless Tobacco Never   Counseling given: Not Answered   Outpatient Medications Prior to Visit  Medication Sig Dispense Refill   albuterol (VENTOLIN HFA) 108 (90 Base) MCG/ACT inhaler INHALE 1 TO 2 PUFFS BY MOUTH EVERY 6 HOURS AS NEEDED FOR COUGHING, WHEEZING, OR SHORTNESS OF BREATH (Patient taking differently: Inhale 1-2 puffs into the lungs every 6 (six) hours as needed for wheezing or shortness of breath.) 20.1 g 3   alendronate (FOSAMAX) 70 MG tablet TAKE 1 TAB BY MOUTH ONCE WEEKLY IN MORNING WITH FULL GLASS OF WATER ON EMPTY STOMACH. DO NOT EAT OR LIE DOWN FOR NEXT 30 MINUTES (Patient taking differently: Take 70 mg by mouth once a week.) 12 tablet 1   Calcium Carb-Cholecalciferol 500-600 MG-UNIT TABS Take 1 tablet by mouth 2 (two) times daily with a meal. 180 tablet 1   Cholecalciferol (VITAMIN D3) 50 MCG  (2000 UT) TABS Take 2 tablets by mouth daily with breakfast.     famotidine (PEPCID) 20 MG tablet Take 20 mg by  mouth 2 (two) times daily.     fluticasone (FLONASE) 50 MCG/ACT nasal spray USE 1 SPRAY IN EACH NOSTRIL TWICE DAILY (Patient taking differently: Place 1 spray into both nostrils daily.) 48 g 3   ipratropium-albuterol (DUONEB) 0.5-2.5 (3) MG/3ML SOLN INHALE 1 VIAL VIA NEBULIZER EVERY 6 HOURS AS NEEDED (Patient taking differently: Take 3 mLs by nebulization every 6 (six) hours as needed (sob/wheezing).) 360 mL 3   loratadine (CLARITIN) 10 MG tablet TAKE 1 Tablet BY MOUTH ONCE EVERY DAY AS NEEDED (Patient taking differently: Take 10 mg by mouth daily as needed for allergies.) 90 tablet 3   montelukast (SINGULAIR) 10 MG tablet TAKE 1 Tablet BY MOUTH ONCE EVERY NIGHT AT BEDTIME (Patient taking differently: Take 10 mg by mouth at bedtime.) 90 tablet 3   omeprazole (PRILOSEC) 40 MG capsule TAKE 1 Capsule BY MOUTH ONCE EVERY DAY (Patient taking differently: Take 40 mg by mouth daily.) 90 capsule 1   predniSONE (DELTASONE) 10 MG tablet Take 4 tabs for 2 days, then 3 tabs for 2 days, 2 tabs for 2 days, then 1 tab for 2 days, then stop. 20 tablet 0   Fluticasone-Umeclidin-Vilant (TRELEGY ELLIPTA) 200-62.5-25 MCG/ACT AEPB Inhale 1 puff into the lungs daily. (Patient not taking: Reported on 04/27/2022) 14 each 0   No facility-administered medications prior to visit.     Review of Systems:   Constitutional: No weight loss or gain, night sweats, fevers, chills, fatigue, or lassitude. HEENT: No headaches, difficulty swallowing, tooth/dental problems, or sore throat. No sneezing, itching, ear ache, nasal congestion, or post nasal drip CV:  No chest pain, orthopnea, PND, swelling in lower extremities, anasarca, dizziness, palpitations, syncope Resp: No shortness of breath with exertion or at rest. No excess mucus or change in color of mucus. No productive or non-productive. No hemoptysis. No wheezing.  No  chest wall deformity GI:  No heartburn, indigestion, abdominal pain, nausea, vomiting, diarrhea, change in bowel habits, loss of appetite, bloody stools.  GU: No dysuria, change in color of urine, urgency or frequency.  No flank pain, no hematuria  Skin: No rash, lesions, ulcerations MSK:  No joint pain or swelling.  No decreased range of motion.  No back pain. Neuro: No dizziness or lightheadedness.  Psych: No depression or anxiety. Mood stable.     Physical Exam:  BP 128/78   Pulse 78   Ht '5\' 6"'$  (1.676 m)   Wt (!) 316 lb 6.4 oz (143.5 kg)   LMP 04/27/2022 (Exact Date)   SpO2 97%   BMI 51.07 kg/m   GEN: Pleasant, interactive, well-nourished/chronically-ill appearing/acutely-ill appearing/poorly-nourished/morbidly obese; in no acute distress.****** HEENT:  Normocephalic and atraumatic. EACs patent bilaterally. TM pearly gray with present light reflex bilaterally. PERRLA. Sclera white. Nasal turbinates pink, moist and patent bilaterally. No rhinorrhea present. Oropharynx pink and moist, without exudate or edema. No lesions, ulcerations, or postnasal drip.  NECK:  Supple w/ fair ROM. No JVD present. Normal carotid impulses w/o bruits. Thyroid symmetrical with no goiter or nodules palpated. No lymphadenopathy.   CV: RRR, no m/r/g, no peripheral edema. Pulses intact, +2 bilaterally. No cyanosis, pallor or clubbing. PULMONARY:  Unlabored, regular breathing. Clear bilaterally A&P w/o wheezes/rales/rhonchi. No accessory muscle use.  GI: BS present and normoactive. Soft, non-tender to palpation. No organomegaly or masses detected. No CVA tenderness. MSK: No erythema, warmth or tenderness. Cap refil <2 sec all extrem. No deformities or joint swelling noted.  Neuro: A/Ox3. No focal deficits noted.   Skin: Warm, no lesions  or rashe Psych: Normal affect and behavior. Judgement and thought content appropriate.     Lab Results:  CBC    Component Value Date/Time   WBC 8.7 04/27/2022 0818    RBC 4.12 04/27/2022 0818   HGB 12.4 04/27/2022 0818   HCT 37.5 04/27/2022 0818   PLT 455 (H) 04/27/2022 0818   MCV 91.0 04/27/2022 0818   MCH 30.1 04/27/2022 0818   MCHC 33.1 04/27/2022 0818   RDW 12.6 04/27/2022 0818   LYMPHSABS 3.1 04/27/2022 0818   MONOABS 0.5 04/27/2022 0818   EOSABS 0.3 04/27/2022 0818   BASOSABS 0.0 04/27/2022 0818    BMET    Component Value Date/Time   NA 137 04/27/2022 0818   K 3.8 04/27/2022 0818   CL 109 04/27/2022 0818   CO2 23 04/27/2022 0818   GLUCOSE 121 (H) 04/27/2022 0818   BUN 6 04/27/2022 0818   CREATININE 0.65 04/27/2022 0818   CALCIUM 8.4 (L) 04/27/2022 0818   GFRNONAA >60 04/27/2022 0818   GFRAA >60 08/23/2019 1125    BNP    Component Value Date/Time   BNP 28.0 04/27/2022 0818     Imaging:  DG Chest 2 View  Result Date: 05/06/2022 CLINICAL DATA:  COPD EXAM: CHEST - 2 VIEW COMPARISON:  None Available. FINDINGS: Normal mediastinum and cardiac silhouette. Normal pulmonary vasculature. No evidence of effusion, infiltrate, or pneumothorax. No acute bony abnormality. Arms at side on lateral projection IMPRESSION: No acute cardiopulmonary process. Electronically Signed   By: Suzy Bouchard M.D.   On: 05/06/2022 09:17   CT Chest W Contrast  Result Date: 04/27/2022 CLINICAL DATA:  Pneumonia cough, congestion EXAM: CT CHEST WITH CONTRAST TECHNIQUE: Multidetector CT imaging of the chest was performed during intravenous contrast administration. RADIATION DOSE REDUCTION: This exam was performed according to the departmental dose-optimization program which includes automated exposure control, adjustment of the mA and/or kV according to patient size and/or use of iterative reconstruction technique. CONTRAST:  39m OMNIPAQUE IOHEXOL 300 MG/ML  SOLN COMPARISON:  CT abdomen pelvis, 09/13/2019 FINDINGS: Cardiovascular: No significant vascular findings. Normal heart size. No pericardial effusion. Unchanged, benign pericardial cyst adjacent to the right  atrium measuring 3.9 x 2.1 cm, no further follow-up or characterization required. (Series 2, image 90). Mediastinum/Nodes: No enlarged mediastinal, hilar, or axillary lymph nodes. Thyroid gland, trachea, and esophagus demonstrate no significant findings. Lungs/Pleura: Irregular, scattered ground-glass opacity of the upper lobes and apices (series 4, image 30). Bandlike scarring or atelectasis of the bilateral lung bases. No pleural effusion or pneumothorax. Upper Abdomen: No acute abnormality. Partially imaged, definitively benign fat containing right adrenal adenoma, for which no further follow-up or characterization is required. Status post cholecystectomy. Musculoskeletal: No chest wall abnormality. No acute osseous findings. IMPRESSION: Irregular, scattered ground-glass opacity of the upper lobes and lung apices, nonspecific and infectious or inflammatory. Electronically Signed   By: ADelanna AhmadiM.D.   On: 04/27/2022 13:44   DG Chest Portable 1 View  Result Date: 04/27/2022 CLINICAL DATA:  Cough, congestion, shortness of breath and fever since yesterday question pneumonia EXAM: PORTABLE CHEST 1 VIEW COMPARISON:  Portable exam 0824 hours compared to 02/06/2021 FINDINGS: Upper normal size of cardiac silhouette. Mediastinal contours and pulmonary vascularity normal. Lungs grossly clear. No definite infiltrate, pleural effusion, or pneumothorax. Osseous structures unremarkable. IMPRESSION: No acute abnormalities. Electronically Signed   By: MLavonia DanaM.D.   On: 04/27/2022 08:31         Latest Ref Rng & Units 10/09/2019    2:53  PM  PFT Results  FVC-Pre L 2.37  P  FVC-Predicted Pre % 62  P  FVC-Post L 2.46  P  FVC-Predicted Post % 64  P  Pre FEV1/FVC % % 62  P  Post FEV1/FCV % % 64  P  FEV1-Pre L 1.48  P  FEV1-Predicted Pre % 47  P  FEV1-Post L 1.57  P  DLCO uncorrected ml/min/mmHg 22.61  P  DLCO UNC% % 100  P  DLCO corrected ml/min/mmHg 24.64  P  DLCO COR %Predicted % 109  P  DLVA Predicted  % 135  P  TLC L 5.08  P  TLC % Predicted % 97  P  RV % Predicted % 170  P    P Preliminary result    No results found for: "NITRICOXIDE"      Assessment & Plan:   Severe persistent asthma with acute exacerbation Severe asthma with allergic phenotype; resolving exacerbation today. She has been on multiple biologics in the past; failed [place] and Xolair. She did have improvement on Dupixent with less frequent flares. Unfortunately, she has stopped this as she never renewed her patient assistance. Re-educated on the purpose of biologic therapy today and shared decision to restart. Application completed and message sent to the pharmacy.  She has restarted Judithann Sauger, which she feels like works the best for her. She is aware this is used off label currently for asthma and would like to continue. Provided with samples. She was provided with patient assistance paperwork due to being uninsured.   Patient Instructions  Continue Breztri 2 puffs Twice daily. Brush tongue and rinse mouth afterwards. Complete patient assistance paperwork  Continue Albuterol inhaler 2 puffs or 3 mL neb every 6 hours as needed for shortness of breath or wheezing. Notify if symptoms persist despite rescue inhaler/neb use.  Continue flonase 1 spray each nostril daily Continue Claritin 1 tab daily  Continue Singulair 1 tab At bedtime  Continue omeprazole 1 tab daily   Complete prednisone taper as previously directed  [facility] team will contact you for scheduling   Asthma action plan: START DUONEBS up to four times a day for worsening shortness of breath, wheezing and cough. If you symptoms do not improve in 24-48 hours, start steroid pack that has been prescribed. Please call our office for evaluation and to let us know you steroid the steroid pack (60,50,40,30,20,10 pack).  Follow up in 2 weeks with Dr. Loanne Drilling (1st) or Katie Milarose Savich,NP. If symptoms do not improve or worsen, please contact office for  sooner follow up or seek emergency care.     I spent *** minutes of dedicated to the care of this patient on the date of this encounter to include pre-visit review of records, face-to-face time with the patient discussing conditions above, post visit ordering of testing, clinical documentation with the electronic health record, making appropriate referrals as documented, and communicating necessary findings to members of the patients care team.  Clayton Bibles, NP 05/06/2022  Pt aware and understands NP's role.

## 2022-05-06 NOTE — Patient Instructions (Addendum)
Continue Breztri 2 puffs Twice daily. Brush tongue and rinse mouth afterwards. Complete patient assistance paperwork  Continue Albuterol inhaler 2 puffs or 3 mL neb every 6 hours as needed for shortness of breath or wheezing. Notify if symptoms persist despite rescue inhaler/neb use.  Continue flonase 1 spray each nostril daily Continue Claritin 1 tab daily  Continue Singulair 1 tab At bedtime  Continue omeprazole 1 tab daily   Complete prednisone taper as previously directed  [facility] team will contact you for scheduling   Asthma action plan: START DUONEBS up to four times a day for worsening shortness of breath, wheezing and cough. If you symptoms do not improve in 24-48 hours, start steroid pack that has been prescribed. Please call our office for evaluation and to let us know you steroid the steroid pack (60,50,40,30,20,10 pack).  Follow up in 2 weeks with Dr. Loanne Drilling (1st) or Katie Mieshia Pepitone,NP. If symptoms do not improve or worsen, please contact office for sooner follow up or seek emergency care.

## 2022-05-07 ENCOUNTER — Encounter: Payer: Self-pay | Admitting: Nurse Practitioner

## 2022-05-20 ENCOUNTER — Ambulatory Visit: Payer: Medicaid Other | Admitting: Nurse Practitioner

## 2022-05-28 ENCOUNTER — Other Ambulatory Visit: Payer: Self-pay | Admitting: Pulmonary Disease

## 2022-05-28 DIAGNOSIS — J4551 Severe persistent asthma with (acute) exacerbation: Secondary | ICD-10-CM

## 2022-06-10 ENCOUNTER — Ambulatory Visit: Payer: Medicaid Other | Admitting: Physician Assistant

## 2022-06-30 ENCOUNTER — Telehealth: Payer: Self-pay | Admitting: "Endocrinology

## 2022-06-30 NOTE — Telephone Encounter (Signed)
Kim, Pt needs a DEXA. She has a f/u with her next week

## 2022-07-01 ENCOUNTER — Other Ambulatory Visit: Payer: Self-pay | Admitting: Physician Assistant

## 2022-07-01 DIAGNOSIS — J4551 Severe persistent asthma with (acute) exacerbation: Secondary | ICD-10-CM

## 2022-07-01 MED ORDER — OMEPRAZOLE 40 MG PO CPDR: 40.0000 mg | DELAYED_RELEASE_CAPSULE | Freq: Every day | ORAL | 1 refills | Status: DC

## 2022-07-01 MED ORDER — ALBUTEROL SULFATE HFA 108 (90 BASE) MCG/ACT IN AERS: INHALATION_SPRAY | RESPIRATORY_TRACT | 1 refills | Status: DC

## 2022-07-01 NOTE — Telephone Encounter (Signed)
Sent order to [facility] radiology

## 2022-07-02 ENCOUNTER — Other Ambulatory Visit: Payer: Self-pay | Admitting: Physician Assistant

## 2022-07-02 MED ORDER — OMEPRAZOLE 40 MG PO CPDR: 40.0000 mg | DELAYED_RELEASE_CAPSULE | Freq: Every day | ORAL | 1 refills | Status: DC

## 2022-07-08 ENCOUNTER — Ambulatory Visit: Payer: Medicaid Other | Admitting: "Endocrinology

## 2022-07-09 ENCOUNTER — Other Ambulatory Visit: Payer: Self-pay | Admitting: Physician Assistant

## 2022-07-09 ENCOUNTER — Telehealth: Payer: Self-pay | Admitting: Pulmonary Disease

## 2022-07-10 ENCOUNTER — Other Ambulatory Visit: Payer: Self-pay

## 2022-07-10 ENCOUNTER — Encounter (HOSPITAL_COMMUNITY): Payer: Self-pay | Admitting: Emergency Medicine

## 2022-07-10 ENCOUNTER — Emergency Department (HOSPITAL_COMMUNITY): Payer: Self-pay

## 2022-07-10 ENCOUNTER — Inpatient Hospital Stay (HOSPITAL_COMMUNITY)
Admission: EM | Admit: 2022-07-10 | Discharge: 2022-07-11 | DRG: 193 | Disposition: A | Discharge status: Home / Self Care | Payer: Self-pay | Attending: Family Medicine | Admitting: Family Medicine

## 2022-07-10 DIAGNOSIS — Z83438 Family history of other disorder of lipoprotein metabolism and other lipidemia: Secondary | ICD-10-CM

## 2022-07-10 DIAGNOSIS — R519 Headache, unspecified: Secondary | ICD-10-CM | POA: Insufficient documentation

## 2022-07-10 DIAGNOSIS — R6520 Severe sepsis without septic shock: Secondary | ICD-10-CM

## 2022-07-10 DIAGNOSIS — Z8711 Personal history of peptic ulcer disease: Secondary | ICD-10-CM

## 2022-07-10 DIAGNOSIS — M810 Age-related osteoporosis without current pathological fracture: Secondary | ICD-10-CM | POA: Diagnosis present

## 2022-07-10 DIAGNOSIS — Z7983 Long term (current) use of bisphosphonates: Secondary | ICD-10-CM

## 2022-07-10 DIAGNOSIS — Z8249 Family history of ischemic heart disease and other diseases of the circulatory system: Secondary | ICD-10-CM

## 2022-07-10 DIAGNOSIS — Z9049 Acquired absence of other specified parts of digestive tract: Secondary | ICD-10-CM

## 2022-07-10 DIAGNOSIS — Z1152 Encounter for screening for COVID-19: Secondary | ICD-10-CM

## 2022-07-10 DIAGNOSIS — J9601 Acute respiratory failure with hypoxia: Secondary | ICD-10-CM | POA: Diagnosis present

## 2022-07-10 DIAGNOSIS — J09X1 Influenza due to identified novel influenza A virus with pneumonia: Secondary | ICD-10-CM | POA: Insufficient documentation

## 2022-07-10 DIAGNOSIS — N390 Urinary tract infection, site not specified: Secondary | ICD-10-CM | POA: Insufficient documentation

## 2022-07-10 DIAGNOSIS — A419 Sepsis, unspecified organism: Secondary | ICD-10-CM | POA: Insufficient documentation

## 2022-07-10 DIAGNOSIS — I4510 Unspecified right bundle-branch block: Secondary | ICD-10-CM | POA: Diagnosis present

## 2022-07-10 DIAGNOSIS — J4551 Severe persistent asthma with (acute) exacerbation: Secondary | ICD-10-CM | POA: Diagnosis present

## 2022-07-10 DIAGNOSIS — Z87891 Personal history of nicotine dependence: Secondary | ICD-10-CM

## 2022-07-10 DIAGNOSIS — Z803 Family history of malignant neoplasm of breast: Secondary | ICD-10-CM

## 2022-07-10 DIAGNOSIS — Z6841 Body Mass Index (BMI) 40.0 and over, adult: Secondary | ICD-10-CM

## 2022-07-10 DIAGNOSIS — Z825 Family history of asthma and other chronic lower respiratory diseases: Secondary | ICD-10-CM

## 2022-07-10 DIAGNOSIS — J1000 Influenza due to other identified influenza virus with unspecified type of pneumonia: Principal | ICD-10-CM | POA: Diagnosis present

## 2022-07-10 DIAGNOSIS — K219 Gastro-esophageal reflux disease without esophagitis: Secondary | ICD-10-CM | POA: Diagnosis present

## 2022-07-10 DIAGNOSIS — J440 Chronic obstructive pulmonary disease with acute lower respiratory infection: Secondary | ICD-10-CM | POA: Diagnosis present

## 2022-07-10 DIAGNOSIS — J189 Pneumonia, unspecified organism: Secondary | ICD-10-CM | POA: Diagnosis present

## 2022-07-10 DIAGNOSIS — Z823 Family history of stroke: Secondary | ICD-10-CM

## 2022-07-10 DIAGNOSIS — J45901 Unspecified asthma with (acute) exacerbation: Secondary | ICD-10-CM

## 2022-07-10 DIAGNOSIS — J441 Chronic obstructive pulmonary disease with (acute) exacerbation: Secondary | ICD-10-CM | POA: Diagnosis present

## 2022-07-10 DIAGNOSIS — Z79899 Other long term (current) drug therapy: Secondary | ICD-10-CM

## 2022-07-10 DIAGNOSIS — J111 Influenza due to unidentified influenza virus with other respiratory manifestations: Principal | ICD-10-CM | POA: Insufficient documentation

## 2022-07-10 DIAGNOSIS — Z833 Family history of diabetes mellitus: Secondary | ICD-10-CM

## 2022-07-10 LAB — CBC WITH DIFFERENTIAL/PLATELET
Abs Immature Granulocytes: 0.03 10*3/uL (ref 0.00–0.07)
Basophils Absolute: 0 10*3/uL (ref 0.0–0.1)
Basophils Relative: 0 %
Eosinophils Absolute: 0 10*3/uL (ref 0.0–0.5)
Eosinophils Relative: 0 %
HCT: 41.4 % (ref 36.0–46.0)
Hemoglobin: 14 g/dL (ref 12.0–15.0)
Immature Granulocytes: 0 %
Lymphocytes Relative: 15 %
Lymphs Abs: 1.1 10*3/uL (ref 0.7–4.0)
MCH: 29.6 pg (ref 26.0–34.0)
MCHC: 33.8 g/dL (ref 30.0–36.0)
MCV: 87.5 fL (ref 80.0–100.0)
Monocytes Absolute: 0.6 10*3/uL (ref 0.1–1.0)
Monocytes Relative: 9 %
Neutro Abs: 5.2 10*3/uL (ref 1.7–7.7)
Neutrophils Relative %: 76 %
Platelets: 352 10*3/uL (ref 150–400)
RBC: 4.73 MIL/uL (ref 3.87–5.11)
RDW: 13.2 % (ref 11.5–15.5)
WBC: 6.9 10*3/uL (ref 4.0–10.5)
nRBC: 0 % (ref 0.0–0.2)

## 2022-07-10 LAB — URINALYSIS, ROUTINE W REFLEX MICROSCOPIC
Bilirubin Urine: NEGATIVE
Glucose, UA: NEGATIVE mg/dL
Ketones, ur: 20 mg/dL — AB
Nitrite: NEGATIVE
Protein, ur: 30 mg/dL — AB
Specific Gravity, Urine: 1.028 (ref 1.005–1.030)
pH: 5 (ref 5.0–8.0)

## 2022-07-10 LAB — RESP PANEL BY RT-PCR (FLU A&B, COVID) ARPGX2
Influenza A by PCR: POSITIVE — AB
Influenza B by PCR: NEGATIVE
SARS Coronavirus 2 by RT PCR: NEGATIVE

## 2022-07-10 LAB — BASIC METABOLIC PANEL
Anion gap: 10 (ref 5–15)
BUN: 10 mg/dL (ref 6–20)
CO2: 21 mmol/L — ABNORMAL LOW (ref 22–32)
Calcium: 8.2 mg/dL — ABNORMAL LOW (ref 8.9–10.3)
Chloride: 102 mmol/L (ref 98–111)
Creatinine, Ser: 0.85 mg/dL (ref 0.44–1.00)
GFR, Estimated: >60 mL/min (ref >60)
Glucose, Bld: 95 mg/dL (ref 70–99)
Potassium: 3.6 mmol/L (ref 3.5–5.1)
Sodium: 133 mmol/L — ABNORMAL LOW (ref 135–145)

## 2022-07-10 LAB — PROTIME-INR
INR: 1.1 (ref 0.8–1.2)
Prothrombin Time: 13.6 seconds (ref 11.4–15.2)

## 2022-07-10 LAB — APTT: aPTT: 31 seconds (ref 24–36)

## 2022-07-10 LAB — PROCALCITONIN: Procalcitonin: 3.47 ng/mL

## 2022-07-10 LAB — LACTIC ACID, PLASMA
Lactic Acid, Venous: 1.1 mmol/L (ref 0.5–1.9)
Lactic Acid, Venous: 1.1 mmol/L (ref 0.5–1.9)

## 2022-07-10 LAB — PREGNANCY, URINE: Preg Test, Ur: NEGATIVE

## 2022-07-10 MED ORDER — ACETAMINOPHEN 650 MG RE SUPP: 650.0000 mg | Freq: Four times a day (QID) | RECTAL | Status: DC | PRN

## 2022-07-10 MED ORDER — METHYLPREDNISOLONE SODIUM SUCC 125 MG IJ SOLR
125.0000 mg | Freq: Every day | INTRAMUSCULAR | Status: DC
  Administered 2022-07-11: 125 mg via INTRAVENOUS
  Filled 2022-07-10: qty 2

## 2022-07-10 MED ORDER — ONDANSETRON HCL 4 MG/2ML IJ SOLN: 4.0000 mg | Freq: Four times a day (QID) | INTRAMUSCULAR | Status: DC | PRN

## 2022-07-10 MED ORDER — SODIUM CHLORIDE 0.9 % IV BOLUS
1000.0000 mL | Freq: Once | INTRAVENOUS | Status: AC
  Administered 2022-07-10: 1000 mL via INTRAVENOUS

## 2022-07-10 MED ORDER — ALBUTEROL (5 MG/ML) CONTINUOUS INHALATION SOLN
10.0000 mg/h | INHALATION_SOLUTION | Freq: Once | RESPIRATORY_TRACT | Status: AC
  Administered 2022-07-10: 10 mg/h via RESPIRATORY_TRACT
  Filled 2022-07-10: qty 20

## 2022-07-10 MED ORDER — FAMOTIDINE 20 MG PO TABS
20.0000 mg | ORAL_TABLET | Freq: Two times a day (BID) | ORAL | Status: DC
  Administered 2022-07-10 – 2022-07-11 (×2): 20 mg via ORAL
  Filled 2022-07-10 (×2): qty 1

## 2022-07-10 MED ORDER — IPRATROPIUM BROMIDE 0.02 % IN SOLN
0.5000 mg | Freq: Once | RESPIRATORY_TRACT | Status: AC
  Administered 2022-07-10: 0.5 mg via RESPIRATORY_TRACT
  Filled 2022-07-10: qty 2.5

## 2022-07-10 MED ORDER — KETOROLAC TROMETHAMINE 30 MG/ML IJ SOLN
30.0000 mg | Freq: Once | INTRAMUSCULAR | Status: AC
  Administered 2022-07-10: 30 mg via INTRAVENOUS
  Filled 2022-07-10: qty 1

## 2022-07-10 MED ORDER — IPRATROPIUM-ALBUTEROL 0.5-2.5 (3) MG/3ML IN SOLN
3.0000 mL | Freq: Four times a day (QID) | RESPIRATORY_TRACT | Status: DC
  Administered 2022-07-10 – 2022-07-11 (×2): 3 mL via RESPIRATORY_TRACT
  Filled 2022-07-10 (×2): qty 3

## 2022-07-10 MED ORDER — ONDANSETRON HCL 4 MG PO TABS: 4.0000 mg | ORAL_TABLET | Freq: Four times a day (QID) | ORAL | Status: DC | PRN

## 2022-07-10 MED ORDER — FLUTICASONE FUROATE-VILANTEROL 200-25 MCG/ACT IN AEPB
1.0000 | INHALATION_SPRAY | Freq: Every day | RESPIRATORY_TRACT | Status: DC
  Administered 2022-07-11: 1 via RESPIRATORY_TRACT
  Filled 2022-07-10: qty 28

## 2022-07-10 MED ORDER — MAGNESIUM SULFATE 2 GM/50ML IV SOLN
2.0000 g | Freq: Once | INTRAVENOUS | Status: AC
  Administered 2022-07-10: 2 g via INTRAVENOUS
  Filled 2022-07-10: qty 50

## 2022-07-10 MED ORDER — DIPHENHYDRAMINE HCL 50 MG/ML IJ SOLN
25.0000 mg | Freq: Once | INTRAMUSCULAR | Status: AC
  Administered 2022-07-10: 25 mg via INTRAVENOUS
  Filled 2022-07-10: qty 1

## 2022-07-10 MED ORDER — LACTATED RINGERS IV SOLN: INTRAVENOUS | Status: AC

## 2022-07-10 MED ORDER — UMECLIDINIUM BROMIDE 62.5 MCG/ACT IN AEPB
1.0000 | INHALATION_SPRAY | Freq: Every day | RESPIRATORY_TRACT | Status: DC
  Administered 2022-07-11: 1 via RESPIRATORY_TRACT
  Filled 2022-07-10: qty 7

## 2022-07-10 MED ORDER — ACETAMINOPHEN 325 MG PO TABS
650.0000 mg | ORAL_TABLET | Freq: Once | ORAL | Status: AC
  Administered 2022-07-10: 650 mg via ORAL
  Filled 2022-07-10: qty 2

## 2022-07-10 MED ORDER — PANTOPRAZOLE SODIUM 40 MG PO TBEC
40.0000 mg | DELAYED_RELEASE_TABLET | Freq: Every day | ORAL | Status: DC
  Administered 2022-07-10: 40 mg via ORAL
  Filled 2022-07-10: qty 1

## 2022-07-10 MED ORDER — OSELTAMIVIR PHOSPHATE 75 MG PO CAPS
75.0000 mg | ORAL_CAPSULE | Freq: Two times a day (BID) | ORAL | Status: DC
  Administered 2022-07-10 – 2022-07-11 (×2): 75 mg via ORAL
  Filled 2022-07-10 (×2): qty 1

## 2022-07-10 MED ORDER — BUDESON-GLYCOPYRROL-FORMOTEROL 160-9-4.8 MCG/ACT IN AERO: 2.0000 | INHALATION_SPRAY | Freq: Two times a day (BID) | RESPIRATORY_TRACT | Status: DC

## 2022-07-10 MED ORDER — MORPHINE SULFATE (PF) 2 MG/ML IV SOLN: 2.0000 mg | INTRAVENOUS | Status: DC | PRN

## 2022-07-10 MED ORDER — SODIUM CHLORIDE 0.9 % IV SOLN
500.0000 mg | INTRAVENOUS | Status: DC
  Administered 2022-07-10: 500 mg via INTRAVENOUS
  Filled 2022-07-10 (×2): qty 5

## 2022-07-10 MED ORDER — MONTELUKAST SODIUM 10 MG PO TABS
10.0000 mg | ORAL_TABLET | Freq: Every day | ORAL | Status: DC
  Administered 2022-07-10: 10 mg via ORAL
  Filled 2022-07-10: qty 1

## 2022-07-10 MED ORDER — ALBUTEROL SULFATE (2.5 MG/3ML) 0.083% IN NEBU: 2.5000 mg | INHALATION_SOLUTION | RESPIRATORY_TRACT | Status: DC | PRN

## 2022-07-10 MED ORDER — LORATADINE 10 MG PO TABS: 10.0000 mg | ORAL_TABLET | Freq: Every day | ORAL | Status: DC | PRN

## 2022-07-10 MED ORDER — SODIUM CHLORIDE 0.9 % IV SOLN
2.0000 g | INTRAVENOUS | Status: DC
  Administered 2022-07-10: 2 g via INTRAVENOUS
  Filled 2022-07-10: qty 20

## 2022-07-10 MED ORDER — METHYLPREDNISOLONE SODIUM SUCC 125 MG IJ SOLR
125.0000 mg | Freq: Once | INTRAMUSCULAR | Status: AC
  Administered 2022-07-10: 125 mg via INTRAVENOUS
  Filled 2022-07-10: qty 2

## 2022-07-10 MED ORDER — PROCHLORPERAZINE EDISYLATE 10 MG/2ML IJ SOLN
10.0000 mg | Freq: Once | INTRAMUSCULAR | Status: AC
  Administered 2022-07-10: 10 mg via INTRAVENOUS
  Filled 2022-07-10: qty 2

## 2022-07-10 MED ORDER — OXYCODONE HCL 5 MG PO TABS: 5.0000 mg | ORAL_TABLET | ORAL | Status: DC | PRN

## 2022-07-10 MED ORDER — HEPARIN SODIUM (PORCINE) 5000 UNIT/ML IJ SOLN
5000.0000 [IU] | Freq: Three times a day (TID) | INTRAMUSCULAR | Status: DC
  Administered 2022-07-10 – 2022-07-11 (×2): 5000 [IU] via SUBCUTANEOUS
  Filled 2022-07-10 (×2): qty 1

## 2022-07-10 MED ORDER — ACETAMINOPHEN 325 MG PO TABS: 650.0000 mg | ORAL_TABLET | Freq: Four times a day (QID) | ORAL | Status: DC | PRN

## 2022-07-10 NOTE — Sepsis Progress Note (Signed)
eLink is following this Code Sepsis. °

## 2022-07-10 NOTE — Assessment & Plan Note (Signed)
-   Patient is on Trelegy, Flonase, DuoNeb, montelukast, albuterol at home - Recently finished outpatient steroid taper - Continue Breztri, DuoNeb scheduled, albuterol as needed - Respiratory therapy evaluation - Exacerbation triggered by influenza A most likely - Continue to monitor

## 2022-07-10 NOTE — ED Notes (Signed)
Ambuated pt. O2 sat stayed at 92.

## 2022-07-10 NOTE — Assessment & Plan Note (Signed)
Continue Pepcid  

## 2022-07-10 NOTE — ED Triage Notes (Signed)
  Patient comes in with fever, headache, and generalized body aches that started Wednesday morning. Patient states she woke up with 103.2 fever and took an at home covid test that was negative.  Patient taking tylenol at home with little relief.  Pain 7/10, headache.

## 2022-07-10 NOTE — Assessment & Plan Note (Signed)
-   Patient was febrile and tachycardic at presentation - Endorgan damage with acute respiratory failure with hypoxia - She was satting 85% on room air - Sepsis pathology is likely multifactorial with UTI, flu A, and possible bacterial pneumonia superimposed all contributing - Procalcitonin pending - Continue Rocephin and Zithromax - Urine culture, expectorated sputum culture, blood culture pending - Strep and Legionella urine antigens pending - Continue Tamiflu - 2 L bolus given in the ED - Continue IV hydration - Continue to monitor

## 2022-07-10 NOTE — Telephone Encounter (Signed)
Called patient back and she states that she is currently in the emergency room. She tested positive for flu. I told her to call us back with any updates. Nothing further needed

## 2022-07-10 NOTE — H&P (Signed)
History and Physical  Linda Ellis OEV:[id number] DOB: [birth year] DOA: 07/10/2022  Referring physician: Dr. Joanette Gula PCP: [name], PA-C   Chief Complaint: Headache and fever  HPI: Linda Ellis is a 46 y.o. female With a history of GERD, Asthma, and COPD who presented to the ED on 07/10/22 for headache and fever. Her boyfriend is at bedside. Symptoms started 07/08/22 and have progressively worsened since then, with shortness of breath and fever beginning yesterday and today. She denies GI complaints and urinary complains. She has some muscle pain from her fever, but denies any other pain. She denies any other complaints. She is currently on 2L [place] and is not on oxygen at home.   ED Course: She received breathing treatments and was placed on 2L [place] due to low oxygen saturation in the 80s on arrival. UA was suggestive of UTI. She was positive for influenza A. BMP showed Na 133, which has repleted with IVF. CBC showed no abnormalities. CXR showed left basilar pulmonary opacity which may be pneumonia or atelectasis. She is being admitted for sepsis with hypoxia in the setting of UTI and pneumonia.   Review of Systems: All systems reviewed and apart from history of presenting illness, are negative.  Past Medical History:  Diagnosis Date   Asthma 2008   COPD (chronic obstructive pulmonary disease) (HCC)    GERD (gastroesophageal reflux disease)    Multiple gastric ulcers    Osteoporosis    Pneumonia    Past Surgical History:  Procedure Laterality Date   CHOLECYSTECTOMY     DIAGNOSTIC LAPAROSCOPY WITH REMOVAL OF ECTOPIC PREGNANCY Right 01/24/2017   Procedure: REMOVAL OF ECTOPIC PREGNANCY;  Surgeon: Florian Buff, MD;  Location: [facility];  Service: Gynecology;  Laterality: Right;   LAPAROSCOPIC UNILATERAL SALPINGECTOMY Right 01/24/2017   Procedure: LAPAROSCOPIC UNILATERAL SALPINGECTOMY;  Surgeon: Florian Buff, MD;  Location: [facility];  Service: Gynecology;  Laterality: Right;    Social History: reports that she quit smoking about 3 years ago, but has cheat cigarettes from time to time. Her smoking use included cigarettes. She started smoking about 34 years ago. She has a 31.00 pack-year smoking history. She has never used smokeless tobacco. She reports no current alcohol use. She reports that she does not use drugs.  No Known Allergies  Family History  Problem Relation Age of Onset   Asthma Mother    COPD Mother    Hypertension Father    Hyperlipidemia Father    Asthma Father    COPD Father    Breast cancer Maternal Grandmother    Diabetes Maternal Grandfather    Heart attack Maternal Grandfather    Stroke Maternal Grandfather    Hypertension Maternal Grandfather     Prior to Admission medications   Medication Sig Start Date End Date Taking? Authorizing Provider  albuterol (VENTOLIN HFA) 108 (90 Base) MCG/ACT inhaler INHALE 1 TO 2 PUFFS BY MOUTH EVERY 6 HOURS AS NEEDED FOR COUGH, WHEEZING, OR SHORTNESS OF BREATH 07/01/22  Yes [name], PA-C  alendronate (FOSAMAX) 70 MG tablet TAKE 1 TAB BY MOUTH ONCE WEEKLY IN MORNING WITH FULL GLASS OF WATER ON EMPTY STOMACH. DO NOT EAT OR LIE DOWN FOR NEXT 30 MINUTES Patient taking differently: Take 70 mg by mouth once a week. 04/21/22  Yes Nida, Marella Chimes, MD  Budeson-Glycopyrrol-Formoterol (BREZTRI AEROSPHERE) 160-9-4.8 MCG/ACT AERO Inhale 2 puffs into the lungs in the morning and at bedtime. 05/06/22  Yes Clayton Bibles, NP  Calcium Carb-Cholecalciferol  500-600 MG-UNIT TABS Take 1 tablet by mouth 2 (two) times daily with a meal. 07/08/20  Yes Nida, Marella Chimes, MD  Cholecalciferol (VITAMIN D3) 50 MCG (2000 UT) TABS Take 2 tablets by mouth daily with breakfast.   Yes [provider]  famotidine (PEPCID) 20 MG tablet Take 20 mg by mouth 2 (two) times daily.   Yes [provider]  fluticasone (FLONASE) 50 MCG/ACT nasal spray USE 1 SPRAY IN EACH NOSTRIL TWICE DAILY Patient taking  differently: Place 1 spray into both nostrils daily. 04/20/22  Yes [name], PA-C  ipratropium-albuterol (DUONEB) 0.5-2.5 (3) MG/3ML SOLN INHALE 1 VIAL VIA NEBULIZER EVERY 6 HOURS AS NEEDED Patient taking differently: Take 3 mLs by nebulization every 6 (six) hours as needed (sob/wheezing). 04/20/22  Yes [name], PA-C  loratadine (CLARITIN) 10 MG tablet TAKE 1 Tablet BY MOUTH ONCE EVERY DAY AS NEEDED Patient taking differently: Take 10 mg by mouth daily as needed for allergies. 04/20/22  Yes [name], PA-C  montelukast (SINGULAIR) 10 MG tablet TAKE 1 Tablet BY MOUTH ONCE EVERY NIGHT AT BEDTIME Patient taking differently: Take 10 mg by mouth at bedtime. 10/23/21  Yes Margaretha Seeds, MD  omeprazole (PRILOSEC) 40 MG capsule Take 1 capsule (40 mg total) by mouth daily. 07/02/22  Yes [name], PA-C  Fluticasone-Umeclidin-Vilant (TRELEGY ELLIPTA) 200-62.5-25 MCG/ACT AEPB Inhale 1 puff into the lungs daily. Patient not taking: Reported on 04/27/2022 03/24/22   Margaretha Seeds, MD  predniSONE (DELTASONE) 10 MG tablet Take 4 tabs for 2 days, then 3 tabs for 2 days, 2 tabs for 2 days, then 1 tab for 2 days, then stop. Patient not taking: Reported on 07/10/2022 05/01/22   Magdalen Spatz, NP  ALBUTEROL IN Inhale 2 puffs into the lungs as needed. For shortness of breath   10/24/11  [provider]   Physical Exam: Vitals:   07/10/22 1530 07/10/22 1600 07/10/22 1647 07/10/22 1730  BP: 122/77 121/68  116/78  Pulse: 89 91  (!) 111  Resp: (!) 22   20  Temp:    100.1 F (37.8 C)  TempSrc:    Oral  SpO2: 93% 94% 94% 97%  Weight:      Height:        General exam: Morbidly obese patient, sitting up comfortably on the gurney in no obvious distress. Head, eyes and ENT: Nontraumatic and normocephalic. Pupils equally reacting to light and accommodation. Oral mucosa moist. Neck: Supple. No JVD, carotid bruit or thyromegaly. Lymphatics: No lymphadenopathy. Respiratory  system: Bilateral rales at bases. No increased work of breathing. Cardiovascular system: S1 and S2 heard, RRR. No JVD, murmurs, gallops, clicks or pedal edema. Gastrointestinal system: Abdomen is nondistended, soft and nontender. Normal bowel sounds heard. No organomegaly or masses appreciated. Central nervous system: Alert and oriented. No focal neurological deficits. Extremities: No gross deformity or weakness Skin: No rashes or acute findings. Musculoskeletal system: Negative exam. Psychiatry: Pleasant and cooperative.  Labs on Admission:  Basic Metabolic Panel: Recent Labs  Lab 07/10/22 1125  NA 133*  K 3.6  CL 102  CO2 21*  GLUCOSE 95  BUN 10  CREATININE 0.85  CALCIUM 8.2*   Liver Function Tests: No results for input(s): "AST", "ALT", "ALKPHOS", "BILITOT", "PROT", "ALBUMIN" in the last 168 hours. No results for input(s): "LIPASE", "AMYLASE" in the last 168 hours. No results for input(s): "AMMONIA" in the last 168 hours. CBC: Recent Labs  Lab 07/10/22 1125  WBC 6.9  NEUTROABS 5.2  HGB  14.0  HCT 41.4  MCV 87.5  PLT 352   Cardiac Enzymes: No results for input(s): "CKTOTAL", "CKMB", "CKMBINDEX", "TROPONINI" in the last 168 hours.  BNP (last 3 results) No results for input(s): "PROBNP" in the last 8760 hours. CBG: No results for input(s): "GLUCAP" in the last 168 hours.  Radiological Exams on Admission: DG Chest 2 View  Result Date: 07/10/2022 CLINICAL DATA:  Flu EXAM: CHEST - 2 VIEW COMPARISON:  05/06/22 CXR FINDINGS: No pleural effusion. No pneumothorax. Increased hazy compared to prior exam. Normal cardiac and mediastinal contours. No displaced rib fracture. Visualized upper abdomen is unremarkable. Left basilar pulmonary opacity IMPRESSION: Hazy left basilar pulmonary opacity, which may represent atelectasis or pneumonia. Electronically Signed   By: Marin Roberts M.D.   On: 07/10/2022 11:07     Assessment/Plan Acute respiratory failure with hypoxia in the  setting of CAP and influenza A -Known influenza A infection, continue Tamiflu -CXR showed left basilar pulmonary opacity which may be pneumonia or atelectasis -Procalcitonin pending -Continue Rocephin and Zithromax -Sputum culture ordered -Urine antigens pending -85% on RA on arrival, continue O2 therapy and wean as able -Continue scheduled DuoNeb, as needed albuterol -RT evaluation  Sepsis -May be due to UTI or Pneumonia, likely multifactorial -febrile and tachycardic with hypoxia on presentation -Procalcitonin pending for possible bacterial infection imposed on influenza A infection -Continue Rocephin and Zithromax -Sputum culture, urine culture, blood culture ordered -Urine antigens pending -IV hydration  UTI -UA suggestive of UTI -Urine cultures pending -rocephin for pneumonia will cover for UTI as well.   Persistent Asthma with COPD in current exacerbation -Likely triggered by influenza A -Continue Breztri, DuoNeb scheduled, albuterol as needed -Recently completed outpatient steroid taper -RT evaluation -Patient is on Trelegy, Flonase, DuoNeb, montelukast, albuterol at home   GERD -Continue Pepcid  Morbid Obesity -BMI 51.00 -Counsel on diet and lifestyle changes.    DVT Prophylaxis: Heparin Code Status: Full  Family Communication: Boyfriend at bedside  Disposition Plan: Home once stable   Time spent: 40mn  Safir Michalec D Sabine Tenenbaum, OMS IV [facility] How to contact the [facility]St [facility] HospitalAttending or Consulting provider [place] covering provider during after hours [place] for this patient?  Check the care team in [facility]and look for a) attending/consulting TR[facility] provider listed and b) the TC[facility] Hospitalteam listed Log into [email or web address] and use [place]'s universal password to access. If you do not have the password, please contact the hospital operator. Locate the T[facility] Centerprovider you are looking for under [facility] and page to a number that you can be directly reached. If you  still have difficulty reaching the provider, please page the [facility](Director on Call) for the Hospitalists listed on amion for assistance.

## 2022-07-10 NOTE — Assessment & Plan Note (Signed)
-   UA suspicious for UTI - The Rocephin for the community-acquired pneumonia will also cover urine - Urine culture pending - Previous urine culture grew multiple species - Continue to monitor

## 2022-07-10 NOTE — Assessment & Plan Note (Signed)
-  Occipital distribution -Resolved with headache cocktail -Continue to monitor.

## 2022-07-10 NOTE — Assessment & Plan Note (Signed)
-   Flu a positive - Chest x-ray shows hazy left basilar pulmonary opacity which may represent atelectasis or pneumonia - Continue Tamiflu - Continue to monitor

## 2022-07-10 NOTE — ED Provider Notes (Signed)
[facility] EMERGENCY DEPARTMENT Provider Note   CSN: [id number] Arrival date & time: 07/10/22  0533     History  Chief Complaint  Patient presents with   Headache   Fever   HPI Linda SAZAMA is a 46 y.o. female with GERD and allergic rhinitis resenting for headache and fever.  Symptoms started on Wednesday.  Headache was mild and then later she developed a fever.  She checked her fever this morning it was 103.2.  Patient also mentioned that she has been short of breath with a productive cough.  Cough has been going on for a week now.  Sputum is brownish-green.  Denies chest pain.   Headache Associated symptoms: fever   Fever Associated symptoms: headaches        Home Medications Prior to Admission medications   Medication Sig Start Date End Date Taking? Authorizing Provider  albuterol (VENTOLIN HFA) 108 (90 Base) MCG/ACT inhaler INHALE 1 TO 2 PUFFS BY MOUTH EVERY 6 HOURS AS NEEDED FOR COUGH, WHEEZING, OR SHORTNESS OF BREATH 07/01/22  Yes [name], PA-C  alendronate (FOSAMAX) 70 MG tablet TAKE 1 TAB BY MOUTH ONCE WEEKLY IN MORNING WITH FULL GLASS OF WATER ON EMPTY STOMACH. DO NOT EAT OR LIE DOWN FOR NEXT 30 MINUTES Patient taking differently: Take 70 mg by mouth once a week. 04/21/22  Yes Nida, Marella Chimes, MD  Budeson-Glycopyrrol-Formoterol (BREZTRI AEROSPHERE) 160-9-4.8 MCG/ACT AERO Inhale 2 puffs into the lungs in the morning and at bedtime. 05/06/22  Yes Cobb, Karie Schwalbe, NP  Calcium Carb-Cholecalciferol 500-600 MG-UNIT TABS Take 1 tablet by mouth 2 (two) times daily with a meal. 07/08/20  Yes Nida, Marella Chimes, MD  Cholecalciferol (VITAMIN D3) 50 MCG (2000 UT) TABS Take 2 tablets by mouth daily with breakfast.   Yes [provider]  famotidine (PEPCID) 20 MG tablet Take 20 mg by mouth 2 (two) times daily.   Yes [provider]  fluticasone (FLONASE) 50 MCG/ACT nasal spray USE 1 SPRAY IN EACH NOSTRIL TWICE DAILY Patient taking differently:  Place 1 spray into both nostrils daily. 04/20/22  Yes [name], PA-C  ipratropium-albuterol (DUONEB) 0.5-2.5 (3) MG/3ML SOLN INHALE 1 VIAL VIA NEBULIZER EVERY 6 HOURS AS NEEDED Patient taking differently: Take 3 mLs by nebulization every 6 (six) hours as needed (sob/wheezing). 04/20/22  Yes [name], PA-C  loratadine (CLARITIN) 10 MG tablet TAKE 1 Tablet BY MOUTH ONCE EVERY DAY AS NEEDED Patient taking differently: Take 10 mg by mouth daily as needed for allergies. 04/20/22  Yes [name], PA-C  montelukast (SINGULAIR) 10 MG tablet TAKE 1 Tablet BY MOUTH ONCE EVERY NIGHT AT BEDTIME Patient taking differently: Take 10 mg by mouth at bedtime. 10/23/21  Yes Margaretha Seeds, MD  omeprazole (PRILOSEC) 40 MG capsule Take 1 capsule (40 mg total) by mouth daily. 07/02/22  Yes [name], PA-C  Fluticasone-Umeclidin-Vilant (TRELEGY ELLIPTA) 200-62.5-25 MCG/ACT AEPB Inhale 1 puff into the lungs daily. Patient not taking: Reported on 04/27/2022 03/24/22   Margaretha Seeds, MD  predniSONE (DELTASONE) 10 MG tablet Take 4 tabs for 2 days, then 3 tabs for 2 days, 2 tabs for 2 days, then 1 tab for 2 days, then stop. Patient not taking: Reported on 07/10/2022 05/01/22   Magdalen Spatz, NP  ALBUTEROL IN Inhale 2 puffs into the lungs as needed. For shortness of breath   10/24/11  [provider]      Allergies    Patient has no known allergies.  Review of Systems   Review of Systems  Constitutional:  Positive for fever.  Neurological:  Positive for headaches.    Physical Exam Updated Vital Signs BP 116/78   Pulse (!) 111   Temp 100.1 F (37.8 C) (Oral)   Resp 20   Ht '5\' 6"'$  (1.676 m)   Wt (!) 143.3 kg   LMP 06/17/2022   SpO2 97%   BMI 51.00 kg/m  Physical Exam Vitals and nursing note reviewed.  HENT:     Head: Normocephalic and atraumatic.     Mouth/Throat:     Mouth: Mucous membranes are moist.  Eyes:     General:        Right eye: No discharge.         Left eye: No discharge.     Conjunctiva/sclera: Conjunctivae normal.  Cardiovascular:     Rate and Rhythm: Regular rhythm. Tachycardia present.     Pulses: Normal pulses.     Heart sounds: Normal heart sounds.  Pulmonary:     Effort: Pulmonary effort is normal.     Breath sounds: Rhonchi present.  Abdominal:     General: Abdomen is flat.     Palpations: Abdomen is soft.  Skin:    General: Skin is warm and dry.  Neurological:     General: No focal deficit present.  Psychiatric:        Mood and Affect: Mood normal.     ED Results / Procedures / Treatments   Labs (all labs ordered are listed, but only abnormal results are displayed) Labs Reviewed  RESP PANEL BY RT-PCR (FLU A&B, COVID) ARPGX2 - Abnormal; Notable for the following components:      Result Value   Influenza A by PCR POSITIVE (*)    All other components within normal limits  URINALYSIS, ROUTINE W REFLEX MICROSCOPIC - Abnormal; Notable for the following components:   Color, Urine AMBER (*)    APPearance HAZY (*)    Hgb urine dipstick SMALL (*)    Ketones, ur 20 (*)    Protein, ur 30 (*)    Leukocytes,Ua MODERATE (*)    Bacteria, UA FEW (*)    Non Squamous Epithelial 0-5 (*)    All other components within normal limits  BASIC METABOLIC PANEL - Abnormal; Notable for the following components:   Sodium 133 (*)    CO2 21 (*)    Calcium 8.2 (*)    All other components within normal limits  CULTURE, BLOOD (ROUTINE X 2)  CULTURE, BLOOD (ROUTINE X 2)  URINE CULTURE  CBC WITH DIFFERENTIAL/PLATELET  LACTIC ACID, PLASMA  LACTIC ACID, PLASMA  PROTIME-INR  APTT  PREGNANCY, URINE  POC URINE PREG, ED    EKG None  Radiology DG Chest 2 View  Result Date: 07/10/2022 CLINICAL DATA:  Flu EXAM: CHEST - 2 VIEW COMPARISON:  05/06/22 CXR FINDINGS: No pleural effusion. No pneumothorax. Increased hazy compared to prior exam. Normal cardiac and mediastinal contours. No displaced rib fracture. Visualized upper abdomen is  unremarkable. Left basilar pulmonary opacity IMPRESSION: Hazy left basilar pulmonary opacity, which may represent atelectasis or pneumonia. Electronically Signed   By: Marin Roberts M.D.   On: 07/10/2022 11:07    Procedures .Critical Care  Performed by: Harriet Pho, PA-C Authorized by: Harriet Pho, PA-C   Critical care provider statement:    Critical care time (minutes):  30   Critical care was necessary to treat or prevent imminent or life-threatening deterioration of the following conditions:  Sepsis   Critical care was time spent personally by me on the following activities:  Development of treatment plan with patient or surrogate, discussions with consultants, evaluation of patient's response to treatment, examination of patient, ordering and review of laboratory studies, ordering and review of radiographic studies, ordering and performing treatments and interventions, pulse oximetry, re-evaluation of patient's condition and review of old charts   Care discussed with: admitting provider       Medications Ordered in ED Medications  lactated ringers infusion (0 mLs Intravenous Paused 07/10/22 1341)  cefTRIAXone (ROCEPHIN) 2 g in sodium chloride 0.9 % 100 mL IVPB (0 g Intravenous Stopped 07/10/22 1328)  azithromycin (ZITHROMAX) 500 mg in sodium chloride 0.9 % 250 mL IVPB (0 mg Intravenous Stopped 07/10/22 1457)  oseltamivir (TAMIFLU) capsule 75 mg (has no administration in time range)  acetaminophen (TYLENOL) tablet 650 mg (650 mg Oral Given 07/10/22 1050)  sodium chloride 0.9 % bolus 1,000 mL (0 mLs Intravenous Stopped 07/10/22 1200)  sodium chloride 0.9 % bolus 1,000 mL (0 mLs Intravenous Stopped 07/10/22 1500)  albuterol (PROVENTIL,VENTOLIN) solution continuous neb (10 mg/hr Nebulization Given 07/10/22 1646)  ipratropium (ATROVENT) nebulizer solution 0.5 mg (0.5 mg Nebulization Given 07/10/22 1647)  magnesium sulfate IVPB 2 g 50 mL (2 g Intravenous New Bag/Given 07/10/22 1615)   methylPREDNISolone sodium succinate (SOLU-MEDROL) 125 mg/2 mL injection 125 mg (125 mg Intravenous Given 07/10/22 1609)  acetaminophen (TYLENOL) tablet 650 mg (650 mg Oral Given 07/10/22 1818)    ED Course/ Medical Decision Making/ A&P                           Medical Decision Making Amount and/or Complexity of Data Reviewed Labs: ordered. Radiology: ordered. ECG/medicine tests: ordered.  Risk OTC drugs. Prescription drug management. Decision regarding hospitalization.   Initial Impression and Ddx 46 yo female ill-appearing presenting for headache and fever.  Physical exam notable for tachycardia, fever and tachypnea.  Differential diagnosis includes flu, COVID, sepsis, pneumonia. Patient PMH that increases complexity of ED encounter: GERD and asthma  Interpretation of Diagnostics I independent reviewed and interpreted the labs as followed: Pyuria, bacteriuria, PCR positive for influenza A  - I independently visualized the following imaging with scope of interpretation limited to determining acute life threatening conditions related to emergency care: cxr, which revealed hazy left basilar opacity concerning for pneumonia versus atelectasis  - I reviewed and interpreted EKG which revealed sinus tachycardia but otherwise non-ishemic.  Patient Reassessment and Ultimate Disposition/Management Treated fever with Tylenol.  Given that patient was tachycardic febrile tachypneic with evidence of possible pneumonia on chest x-ray, at this point triggered code sepsis.  Treated tachycardia was 2 L of normal saline and maintenance LR.  Treated for sepsis empirically with IV ceftriaxone and azithromycin.  Upon reevaluation patient's tachycardia and tachypnea improved. However, while ambulating patient, patient endorsed SOB was hypoxic to the mid eighties. Also with persistence diffuse wheezing and rhonchi. Fever returned and treated with tylenol. Admitted to hospital for new oxygen requirement in  setting of pneumonia and asthma exacerbation.    Patient management required discussion with the following services or consulting groups:  Hospitalist Service  Complexity of Problems Addressed Acute complicated illness or Injury  Additional Data Reviewed and Analyzed Further history obtained from: Past medical history and medications listed in the EMR, Recent discharge summary, and Recent Consult notes  Patient Encounter Risk Assessment Prescriptions and Consideration of hospitalization  Final Clinical Impression(s) / ED Diagnoses Final diagnoses:  Influenza  Pneumonia due to infectious organism, unspecified laterality, unspecified part of lung  Exacerbation of asthma, unspecified asthma severity, unspecified whether persistent    Rx / DC Orders ED Discharge Orders     None         Harriet Pho, PA-C 07/10/22 1914    Fredia Sorrow, MD 07/18/22 1720

## 2022-07-10 NOTE — ED Notes (Signed)
Pt noted with increased expiratory wheezing.  O2 93% on room air. States that she has used her albuterol inhaler without relief.  PA notified, going to in to assess pt.

## 2022-07-10 NOTE — Assessment & Plan Note (Signed)
-   Left basilar pulmonary opacity - Procalcitonin pending to evaluate for superimposed bacterial infection on already known influenza A infection - Continue Rocephin and Zithromax - Sputum culture pending - Strep and general urine antigens pending

## 2022-07-10 NOTE — ED Notes (Signed)
No cardiac monitor beds available at this time.

## 2022-07-10 NOTE — Assessment & Plan Note (Signed)
Oxygen sats down to 85% on room air - Multifactorial with asthma exacerbation, flu, and possible bacterial infection all contributing - Continue oxygen supplementation as needed - Wean off as tolerated - Continue scheduled DuoNeb, as needed albuterol - RT evaluation - Continue to monitor

## 2022-07-11 LAB — CBC WITH DIFFERENTIAL/PLATELET
Abs Immature Granulocytes: 0.02 10*3/uL (ref 0.00–0.07)
Basophils Absolute: 0 10*3/uL (ref 0.0–0.1)
Basophils Relative: 0 %
Eosinophils Absolute: 0 10*3/uL (ref 0.0–0.5)
Eosinophils Relative: 0 %
HCT: 39.9 % (ref 36.0–46.0)
Hemoglobin: 13.2 g/dL (ref 12.0–15.0)
Immature Granulocytes: 0 %
Lymphocytes Relative: 16 %
Lymphs Abs: 0.8 10*3/uL (ref 0.7–4.0)
MCH: 29.3 pg (ref 26.0–34.0)
MCHC: 33.1 g/dL (ref 30.0–36.0)
MCV: 88.7 fL (ref 80.0–100.0)
Monocytes Absolute: 0.2 10*3/uL (ref 0.1–1.0)
Monocytes Relative: 4 %
Neutro Abs: 4.1 10*3/uL (ref 1.7–7.7)
Neutrophils Relative %: 80 %
Platelets: 303 10*3/uL (ref 150–400)
RBC: 4.5 MIL/uL (ref 3.87–5.11)
RDW: 13.3 % (ref 11.5–15.5)
WBC: 5.2 10*3/uL (ref 4.0–10.5)
nRBC: 0 % (ref 0.0–0.2)

## 2022-07-11 LAB — COMPREHENSIVE METABOLIC PANEL
ALT: 155 U/L — ABNORMAL HIGH (ref 0–44)
AST: 114 U/L — ABNORMAL HIGH (ref 15–41)
Albumin: 3 g/dL — ABNORMAL LOW (ref 3.5–5.0)
Alkaline Phosphatase: 210 U/L — ABNORMAL HIGH (ref 38–126)
Anion gap: 8 (ref 5–15)
BUN: 10 mg/dL (ref 6–20)
CO2: 21 mmol/L — ABNORMAL LOW (ref 22–32)
Calcium: 7.9 mg/dL — ABNORMAL LOW (ref 8.9–10.3)
Chloride: 109 mmol/L (ref 98–111)
Creatinine, Ser: 0.67 mg/dL (ref 0.44–1.00)
GFR, Estimated: >60 mL/min (ref >60)
Glucose, Bld: 184 mg/dL — ABNORMAL HIGH (ref 70–99)
Potassium: 3.9 mmol/L (ref 3.5–5.1)
Sodium: 138 mmol/L (ref 135–145)
Total Bilirubin: 0.1 mg/dL — ABNORMAL LOW (ref 0.3–1.2)
Total Protein: 6.6 g/dL (ref 6.5–8.1)

## 2022-07-11 LAB — MAGNESIUM: Magnesium: 2.7 mg/dL — ABNORMAL HIGH (ref 1.7–2.4)

## 2022-07-11 LAB — C-REACTIVE PROTEIN: CRP: 4.9 mg/dL — ABNORMAL HIGH (ref <1.0)

## 2022-07-11 LAB — SEDIMENTATION RATE: Sed Rate: 26 mm/hr — ABNORMAL HIGH (ref 0–22)

## 2022-07-11 LAB — HIV ANTIBODY (ROUTINE TESTING W REFLEX): HIV Screen 4th Generation wRfx: NONREACTIVE

## 2022-07-11 MED ORDER — OSELTAMIVIR PHOSPHATE 75 MG PO CAPS: 75.0000 mg | ORAL_CAPSULE | Freq: Two times a day (BID) | ORAL | 0 refills | Status: DC

## 2022-07-11 MED ORDER — IBUPROFEN 600 MG PO TABS: 600.0000 mg | ORAL_TABLET | Freq: Four times a day (QID) | ORAL | 0 refills | Status: DC | PRN

## 2022-07-11 MED ORDER — LEVOFLOXACIN 750 MG PO TABS: 750.0000 mg | ORAL_TABLET | Freq: Every day | ORAL | 0 refills | Status: AC

## 2022-07-11 MED ORDER — AZITHROMYCIN 500 MG PO TABS: 500.0000 mg | ORAL_TABLET | Freq: Every day | ORAL | 0 refills | Status: DC

## 2022-07-11 MED ORDER — IBUPROFEN 400 MG PO TABS: 600.0000 mg | ORAL_TABLET | Freq: Four times a day (QID) | ORAL | Status: DC | PRN

## 2022-07-11 MED ORDER — METHYLPREDNISOLONE 4 MG PO TBPK: ORAL_TABLET | ORAL | 0 refills | Status: DC

## 2022-07-11 MED ORDER — AZITHROMYCIN 250 MG PO TABS
500.0000 mg | ORAL_TABLET | Freq: Every day | ORAL | Status: DC
  Administered 2022-07-11: 500 mg via ORAL
  Filled 2022-07-11: qty 2

## 2022-07-11 NOTE — Discharge Summary (Signed)
Physician Discharge Summary   Patient: Linda Ellis MRN: [id number] DOB: 1975/12/24  Admit date:     07/10/2022  Discharge date: 07/11/22  Discharge Physician: Deatra James   PCP: [name], PA-C   Recommendations at discharge:   Follow-up with PCP in 1-4 weeks  Discharge Diagnoses: Principal Problem:   Acute respiratory failure with hypoxia ([place]) Active Problems:   CAP (community acquired pneumonia)   Gastroesophageal reflux disease   Severe persistent asthma with acute exacerbation   Influenza   UTI (urinary tract infection)   Sepsis ([place])   Headache  Resolved Problems:   * No resolved hospital problems. *  Hospital Course: Linda Ellis is a 46 y.o. female With a history of GERD, Asthma, and COPD who presented to the ED on 07/10/22 for headache and fever and SOB. Her boyfriend is at bedside. Symptoms started 07/08/22 and have progressively worsened since then, with shortness of breath and fever beginning yesterday and today. She denies GI complaints and urinary complains. She has some muscle pain from her fever, but denies any other pain.  She is currently on 2L [place] and is not on oxygen at home.    ED Course:  Vitals:   07/11/22 0400 07/11/22 0631  BP: 108/72 102/71  Pulse: 60 73  Resp: 20 18  Temp:  97.6 F (36.4 C)  SpO2: 92% 96%   She received breathing treatments and was placed on 2L [place] due to low oxygen saturation in the 80s on arrival. UA was suggestive of UTI. She was positive for influenza A. BMP showed Na 133, which has repleted with IVF. CBC showed no abnormalities. CXR showed left basilar pulmonary opacity which may be pneumonia or atelectasis.   She is being admitted for sepsis with hypoxia in the setting of UTI and Flu A - pneumonia.    * Acute respiratory failure with hypoxia (HCC) Oxygen sats down to 85% on room air>>> was on supplemental oxygen overnight, has been weaned off oxygen, currently satting 94% on room air  - Multifactorial with  asthma exacerbation, influenza A  - Continue scheduled DuoNeb, as needed albuterol   Headache -Occipital distribution >.  Resolved   Sepsis ([place]) -Sepsis was ruled out  - Patient was febrile and tachycardic at presentation - Endorgan damage with acute respiratory failure with hypoxia - She was satting 85% on room air - Sepsis pathology is likely multifactorial with UTI, flu A, and possible bacterial pneumonia superimposed all contributing - Procalcitonin 3.47 - Continue Rocephin and Zithromax >>> switch to p.o. Levaquin -Will follow-up with cultures - Strep and Legionella urine antigens pending - Continue Tamiflu - 2 L bolus given in the ED   UTI (urinary tract infection) - UA suspicious for UTI -Continue antibiotics - Urine culture pending - Previous urine culture grew multiple species - Continue to monitor  Influenza - Flu a positive - Chest x-ray shows hazy left basilar pulmonary opacity which may represent atelectasis or pneumonia - Continue Tamiflu x 5 days - Continue to monitor  Severe persistent asthma with acute exacerbation - Patient is on Trelegy, Flonase, DuoNeb, montelukast, albuterol at home - Recently finished outpatient steroid taper - Continue Breztri, DuoNeb scheduled, albuterol as needed  - Exacerbation triggered by influenza A most likely   Gastroesophageal reflux disease - Continue Pepcid  CAP (community acquired pneumonia) - Left basilar pulmonary opacity - Procalcitonin pending to evaluate for superimposed bacterial infection on already known influenza A infection - Continue Rocephin and Zithromax >>> switch  to p.o. Levaquin - Sputum culture pending - Strep and general urine antigens pending     Disposition: Home Diet recommendation:  Discharge Diet Orders (From admission, onward)     Start     Ordered   07/11/22 0000  Diet - low sodium heart healthy        07/11/22 0959           Regular diet DISCHARGE  MEDICATION: Allergies as of 07/11/2022   No Known Allergies      Medication List     STOP taking these medications    omeprazole 40 MG capsule Commonly known as: PRILOSEC       TAKE these medications    albuterol 108 (90 Base) MCG/ACT inhaler Commonly known as: VENTOLIN HFA INHALE 1 TO 2 PUFFS BY MOUTH EVERY 6 HOURS AS NEEDED FOR COUGH, WHEEZING, OR SHORTNESS OF BREATH   alendronate 70 MG tablet Commonly known as: FOSAMAX TAKE 1 TAB BY MOUTH ONCE WEEKLY IN MORNING WITH FULL GLASS OF WATER ON EMPTY STOMACH. DO NOT EAT OR LIE DOWN FOR NEXT 30 MINUTES What changed: See the new instructions.   Breztri Aerosphere 160-9-4.8 MCG/ACT Aero Generic drug: Budeson-Glycopyrrol-Formoterol Inhale 2 puffs into the lungs in the morning and at bedtime.   Calcium Carb-Cholecalciferol 500-600 MG-UNIT Tabs Take 1 tablet by mouth 2 (two) times daily with a meal.   famotidine 20 MG tablet Commonly known as: PEPCID Take 20 mg by mouth 2 (two) times daily.   fluticasone 50 MCG/ACT nasal spray Commonly known as: FLONASE USE 1 SPRAY IN EACH NOSTRIL TWICE DAILY What changed: when to take this   ibuprofen 600 MG tablet Commonly known as: ADVIL Take 1 tablet (600 mg total) by mouth every 6 (six) hours as needed for fever, headache, mild pain, moderate pain or cramping.   ipratropium-albuterol 0.5-2.5 (3) MG/3ML Soln Commonly known as: DUONEB INHALE 1 VIAL VIA NEBULIZER EVERY 6 HOURS AS NEEDED What changed: See the new instructions.   levofloxacin 750 MG tablet Commonly known as: Levaquin Take 1 tablet (750 mg total) by mouth daily for 5 days.   loratadine 10 MG tablet Commonly known as: CLARITIN TAKE 1 Tablet BY MOUTH ONCE EVERY DAY AS NEEDED What changed: See the new instructions.   methylPREDNISolone 4 MG Tbpk tablet Commonly known as: MEDROL DOSEPAK Medrol Dosepak take as instructed   montelukast 10 MG tablet Commonly known as: SINGULAIR TAKE 1 Tablet BY MOUTH ONCE EVERY NIGHT  AT BEDTIME What changed:  how much to take how to take this when to take this additional instructions   oseltamivir 75 MG capsule Commonly known as: TAMIFLU Take 1 capsule (75 mg total) by mouth 2 (two) times daily for 5 days.   predniSONE 10 MG tablet Commonly known as: DELTASONE Take 4 tabs for 2 days, then 3 tabs for 2 days, 2 tabs for 2 days, then 1 tab for 2 days, then stop.   Trelegy Ellipta 200-62.5-25 MCG/ACT Aepb Generic drug: Fluticasone-Umeclidin-Vilant Inhale 1 puff into the lungs daily.   Vitamin D3 50 MCG (2000 UT) Tabs Take 2 tablets by mouth daily with breakfast.        Discharge Exam: Filed Weights   07/10/22 0600  Weight: (!) 143.3 kg      Physical Exam:   General:  AAO x 3,  cooperative, no distress;   HEENT:  Normocephalic, PERRL, otherwise with in Normal limits   Neuro:  CNII-XII intact. , normal motor and sensation, reflexes intact  Lungs:   Clear to auscultation BL, Respirations unlabored,  No wheezes / crackles  Cardio:    S1/S2, RRR, No murmure, No Rubs or Gallops   Abdomen:  Soft, non-tender, bowel sounds active all four quadrants, no guarding or peritoneal signs.  Muscular  skeletal:  Limited exam -global generalized weaknesses - in bed, able to move all 4 extremities,   2+ pulses,  symmetric, No pitting edema  Skin:  Dry, warm to touch, negative for any Rashes,  Wounds: Please see nursing documentation          Condition at discharge: good  The results of significant diagnostics from this hospitalization (including imaging, microbiology, ancillary and laboratory) are listed below for reference.   Imaging Studies: DG Chest 2 View  Result Date: 07/10/2022 CLINICAL DATA:  Flu EXAM: CHEST - 2 VIEW COMPARISON:  05/06/22 CXR FINDINGS: No pleural effusion. No pneumothorax. Increased hazy compared to prior exam. Normal cardiac and mediastinal contours. No displaced rib fracture. Visualized upper abdomen is unremarkable. Left basilar  pulmonary opacity IMPRESSION: Hazy left basilar pulmonary opacity, which may represent atelectasis or pneumonia. Electronically Signed   By: Marin Roberts M.D.   On: 07/10/2022 11:07    Microbiology: Results for orders placed or performed during the hospital encounter of 07/10/22  Resp Panel by RT-PCR (Flu A&B, Covid) Anterior Nasal Swab     Status: Abnormal   Collection Time: 07/10/22  6:20 AM   Specimen: Anterior Nasal Swab  Result Value Ref Range Status   SARS Coronavirus 2 by RT PCR NEGATIVE NEGATIVE Final    Comment: (NOTE) SARS-CoV-2 target nucleic acids are NOT DETECTED.  The SARS-CoV-2 RNA is generally detectable in upper respiratory specimens during the acute phase of infection. The lowest concentration of SARS-CoV-2 viral copies this assay can detect is 138 copies/mL. A negative result does not preclude SARS-Cov-2 infection and should not be used as the sole basis for treatment or other patient management decisions. A negative result may occur with  improper specimen collection/handling, submission of specimen other than nasopharyngeal swab, presence of viral mutation(s) within the areas targeted by this assay, and inadequate number of viral copies(<138 copies/mL). A negative result must be combined with clinical observations, patient history, and epidemiological information. The expected result is Negative.  Fact Sheet for Patients:  EntrepreneurPulse.com.au  Fact Sheet for Healthcare Providers:  IncredibleEmployment.be  This test is no t yet approved or cleared by the [place] FDA and  has been authorized for detection and/or diagnosis of SARS-CoV-2 by FDA under an Emergency Use Authorization (EUA). This EUA will remain  in effect (meaning this test can be used) for the duration of the COVID-19 declaration under Section 564(b)(1) of the Act, 21 U.S.C.section 360bbb-3(b)(1), unless the authorization is terminated  or revoked  sooner.       Influenza A by PCR POSITIVE (A) NEGATIVE Final   Influenza B by PCR NEGATIVE NEGATIVE Final    Comment: (NOTE) The Xpert Xpress SARS-CoV-2/FLU/RSV plus assay is intended as an aid in the diagnosis of influenza from Nasopharyngeal swab specimens and should not be used as a sole basis for treatment. Nasal washings and aspirates are unacceptable for Xpert Xpress SARS-CoV-2/FLU/RSV testing.  Fact Sheet for Patients: EntrepreneurPulse.com.au  Fact Sheet for Healthcare Providers: IncredibleEmployment.be  This test is not yet approved or cleared by the [place] FDA and has been authorized for detection and/or diagnosis of SARS-CoV-2 by FDA under an Emergency Use Authorization (EUA). This EUA will remain in effect (meaning this  test can be used) for the duration of the COVID-19 declaration under Section 564(b)(1) of the Act, 21 U.S.C. section 360bbb-3(b)(1), unless the authorization is terminated or revoked.  Performed at [facility], [address] [place], [place] [address]   Blood Culture (routine x 2)     Status: None (Preliminary result)   Collection Time: 07/10/22 11:48 AM   Specimen: BLOOD LEFT HAND  Result Value Ref Range Status   Specimen Description   Final    BLOOD LEFT HAND BOTTLES DRAWN AEROBIC AND ANAEROBIC   Special Requests Blood Culture adequate volume  Final   Culture   Final    NO GROWTH < 12 HOURS Performed at [facility], [address], [place], [place] [address]    Report Status PENDING  Incomplete  Blood Culture (routine x 2)     Status: None (Preliminary result)   Collection Time: 07/10/22 11:53 AM   Specimen: BLOOD LEFT ARM  Result Value Ref Range Status   Specimen Description BLOOD LEFT ARM BOTTLES DRAWN AEROBIC AND ANAEROBIC  Final   Special Requests Blood Culture adequate volume  Final   Culture   Final    NO GROWTH < 12 HOURS Performed at [facility], [address] [place], [place]  [address]    Report Status PENDING  Incomplete    Labs: CBC: Recent Labs  Lab 07/10/22 1125 07/11/22 0429  WBC 6.9 5.2  NEUTROABS 5.2 4.1  HGB 14.0 13.2  HCT 41.4 39.9  MCV 87.5 88.7  PLT 352 030   Basic Metabolic Panel: Recent Labs  Lab 07/10/22 1125 07/11/22 0429  NA 133* 138  K 3.6 3.9  CL 102 109  CO2 21* 21*  GLUCOSE 95 184*  BUN 10 10  CREATININE 0.85 0.67  CALCIUM 8.2* 7.9*  MG  --  2.7*   Liver Function Tests: Recent Labs  Lab 07/11/22 0429  AST 114*  ALT 155*  ALKPHOS 210*  BILITOT 0.1*  PROT 6.6  ALBUMIN 3.0*   CBG: No results for input(s): "GLUCAP" in the last 168 hours.  Discharge time spent: greater than 30 minutes.  Signed: Deatra James, MD [facility] 07/11/2022

## 2022-07-11 NOTE — ED Notes (Signed)
RT notified that pt is ready for a breathing tx

## 2022-07-11 NOTE — ED Notes (Addendum)
Pt stated that she does not need her breathing tx right now. Will let this RN know if she needs it at a later time. RT made aware

## 2022-07-11 NOTE — Hospital Course (Signed)
Linda Ellis is a 46 y.o. female With a history of GERD, Asthma, and COPD who presented to the ED on 07/10/22 for headache and fever and SOB. Her boyfriend is at bedside. Symptoms started 07/08/22 and have progressively worsened since then, with shortness of breath and fever beginning yesterday and today. She denies GI complaints and urinary complains. She has some muscle pain from her fever, but denies any other pain.  She is currently on 2L [place] and is not on oxygen at home.    ED Course:  Vitals:   07/11/22 0400 07/11/22 0631  BP: 108/72 102/71  Pulse: 60 73  Resp: 20 18  Temp:  97.6 F (36.4 C)  SpO2: 92% 96%   She received breathing treatments and was placed on 2L [place] due to low oxygen saturation in the 80s on arrival. UA was suggestive of UTI. She was positive for influenza A. BMP showed Na 133, which has repleted with IVF. CBC showed no abnormalities. CXR showed left basilar pulmonary opacity which may be pneumonia or atelectasis.   She is being admitted for sepsis with hypoxia in the setting of UTI and Flu A - pneumonia.

## 2022-07-11 NOTE — ED Notes (Signed)
Pt walked around nurses station with tech O2 sat was in 93-94%. No SOB noted.

## 2022-07-11 NOTE — ED Notes (Signed)
Pt ambulated to the bathroom unassisted.  

## 2022-07-12 LAB — URINE CULTURE

## 2022-07-15 ENCOUNTER — Telehealth: Payer: Self-pay | Admitting: Pharmacist

## 2022-07-15 ENCOUNTER — Ambulatory Visit (INDEPENDENT_AMBULATORY_CARE_PROVIDER_SITE_OTHER): Payer: Self-pay | Admitting: Pulmonary Disease

## 2022-07-15 ENCOUNTER — Encounter (HOSPITAL_BASED_OUTPATIENT_CLINIC_OR_DEPARTMENT_OTHER): Payer: Self-pay | Admitting: Pulmonary Disease

## 2022-07-15 DIAGNOSIS — Z7952 Long term (current) use of systemic steroids: Secondary | ICD-10-CM

## 2022-07-15 DIAGNOSIS — J4551 Severe persistent asthma with (acute) exacerbation: Secondary | ICD-10-CM

## 2022-07-15 LAB — CULTURE, BLOOD (ROUTINE X 2)
Culture: NO GROWTH
Culture: NO GROWTH
Special Requests: ADEQUATE
Special Requests: ADEQUATE

## 2022-07-15 MED ORDER — PREDNISONE 10 MG PO TABS: ORAL_TABLET | ORAL | 0 refills | Status: DC

## 2022-07-15 MED ORDER — IPRATROPIUM-ALBUTEROL 0.5-2.5 (3) MG/3ML IN SOLN: 3.0000 mL | Freq: Four times a day (QID) | RESPIRATORY_TRACT | 3 refills | Status: DC | PRN

## 2022-07-15 MED ORDER — INCRUSE ELLIPTA 62.5 MCG/ACT IN AEPB: 1.0000 | INHALATION_SPRAY | Freq: Every day | RESPIRATORY_TRACT | 5 refills | Status: DC

## 2022-07-15 MED ORDER — MONTELUKAST SODIUM 10 MG PO TABS: 10.0000 mg | ORAL_TABLET | Freq: Every day | ORAL | 3 refills | Status: DC

## 2022-07-15 NOTE — Addendum Note (Signed)
Addended by: Rodman Pickle on: 07/15/2022 01:04 PM   Modules accepted: Orders
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
## 2022-07-15 NOTE — Telephone Encounter (Addendum)
Patient is uninsured. Will need to pursue patient assistance. She confirms that she is uninsured. She will complete DMW enrollment forms and plan to drop off to the clinic tomorrow (at [facility] location). Patient aware to let front desk know the forms are for the pharmacy team.  Knox Saliva, PharmD, MPH, BCPS, CPP Clinical Pharmacist (Rheumatology and Pulmonology)  ----- Message from [place], MD sent at 07/15/2022 10:47 AM EST ----- Patient previously requested to enroll in [place]. I gave her additional paperwork just in case but please contact her so we can restart.

## 2022-07-15 NOTE — Progress Notes (Signed)
Will restart Dupixent BIV  Knox Saliva, PharmD, MPH, BCPS, CPP Clinical Pharmacist (Rheumatology and Pulmonology)

## 2022-07-15 NOTE — Addendum Note (Signed)
Addended by: Darliss Ridgel on: 07/15/2022 11:03 AM   Modules accepted: Orders
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
## 2022-07-16 NOTE — Telephone Encounter (Signed)
Received signed patient form. Submitted Patient Assistance Application to [place] for [place] along with provider portion, patient portion, med list. PA not required since pt is uninsured. Will update patient when we receive a response.  Fax# [phone] Phone# [phone]  Knox Saliva, PharmD, MPH, BCPS, CPP Clinical Pharmacist (Rheumatology and Pulmonology)

## 2022-07-23 ENCOUNTER — Other Ambulatory Visit: Payer: Self-pay | Admitting: Pulmonary Disease

## 2022-07-23 ENCOUNTER — Telehealth: Payer: Self-pay | Admitting: Pulmonary Disease

## 2022-07-23 DIAGNOSIS — J4551 Severe persistent asthma with (acute) exacerbation: Secondary | ICD-10-CM

## 2022-07-24 NOTE — Telephone Encounter (Signed)
Medication was sent to   [facility], [address], [place] [address] Phone: [phone]  Fax: [phone]

## 2022-07-29 ENCOUNTER — Telehealth: Payer: Self-pay

## 2022-07-29 NOTE — Telephone Encounter (Signed)
Telephoned patient at mobile number. Left a voice message with [employer] (scholarship) contact information. 

## 2022-08-05 ENCOUNTER — Telehealth: Payer: Self-pay | Admitting: Physician Assistant

## 2022-08-05 NOTE — Telephone Encounter (Signed)
Pt was called and given phone number for [facility] so she can get her mammogram scheduled.

## 2022-08-06 ENCOUNTER — Telehealth: Payer: Self-pay

## 2022-08-06 NOTE — Telephone Encounter (Signed)
Telephoned patient at mobile number. Patient ineligible for mammogram scholarship after answering screening questions.

## 2022-08-13 ENCOUNTER — Telehealth: Payer: Self-pay | Admitting: Pulmonary Disease

## 2022-08-13 ENCOUNTER — Ambulatory Visit (INDEPENDENT_AMBULATORY_CARE_PROVIDER_SITE_OTHER): Payer: Self-pay | Admitting: Nurse Practitioner

## 2022-08-13 ENCOUNTER — Telehealth: Payer: Self-pay | Admitting: Student

## 2022-08-13 ENCOUNTER — Other Ambulatory Visit: Payer: Self-pay | Admitting: Nurse Practitioner

## 2022-08-13 ENCOUNTER — Encounter: Payer: Self-pay | Admitting: Nurse Practitioner

## 2022-08-13 ENCOUNTER — Ambulatory Visit (HOSPITAL_COMMUNITY)
Admission: RE | Admit: 2022-08-13 | Discharge: 2022-08-13 | Disposition: A | Discharge status: Home / Self Care | Payer: BC Managed Care – PPO | Source: Ambulatory Visit | Attending: Nurse Practitioner | Admitting: Nurse Practitioner

## 2022-08-13 VITALS — BP 110/74 | HR 98 | Ht 64.0 in | Wt 315.4 lb

## 2022-08-13 DIAGNOSIS — J189 Pneumonia, unspecified organism: Secondary | ICD-10-CM

## 2022-08-13 DIAGNOSIS — J4551 Severe persistent asthma with (acute) exacerbation: Secondary | ICD-10-CM

## 2022-08-13 DIAGNOSIS — Z7952 Long term (current) use of systemic steroids: Secondary | ICD-10-CM

## 2022-08-13 MED ORDER — PREDNISONE 10 MG PO TABS: ORAL_TABLET | ORAL | 0 refills | Status: DC

## 2022-08-13 MED ORDER — BENZONATATE 200 MG PO CAPS: 200.0000 mg | ORAL_CAPSULE | Freq: Three times a day (TID) | ORAL | 1 refills | Status: DC | PRN

## 2022-08-13 MED ORDER — AZITHROMYCIN 250 MG PO TABS: ORAL_TABLET | ORAL | 0 refills | Status: DC

## 2022-08-13 NOTE — Telephone Encounter (Signed)
Quantity for pt's pred taper needs to be 63 tabs instead of 60tabs.  Called and spoke with pharmacist at pt's pharmacy to provide them with this info. Nothing further needed.

## 2022-08-13 NOTE — Progress Notes (Signed)
CXR clear. Previous pna resolved. Pt made aware. Placed on z pack for asthmatic bronchitis. No further questions.

## 2022-08-13 NOTE — Telephone Encounter (Signed)
Called and spoke with pt who states she went to the ED 1 month ago and was diagnosed with flu and pneumonia.  Pt said she was getting some better after meds that had been prescribed.  States for the past two weeks, she was coughing up brown-green phlegm but states today 1/11, she woke up feeling very tight in her chest and is unable to cough up any phlegm.  Pt said she has done about 5 breathing treatments so far today and states that she has also been using her rescue inhaler at least 3 or more times.  Pt states that she does not feel like this is helping her at all.  Stated to pt that we did have an opening today 1/11 at 2:30 and she verbalized understanding. Appt scheduled. Nothing further needed.

## 2022-08-13 NOTE — Progress Notes (Signed)
$'@Patient'y$  ID: Linda Ellis, female    DOB: [birth year], 47 y.o.   MRN: [id number]  Chief Complaint  Patient presents with   Acute Visit    Referring provider: [name], PA-C  HPI: 47 year old female, former smoker followed for severe asthma on steroids. She is a patient of Dr. Cordelia Pen and last seen in office 07/15/2022. Past medical history significant for allergic rhinitis, GERD, obesity, prediabetes, vit D deficiency.   TEST/EVENTS:  10/09/2019 PFT: FVC 62, FEV1 47, ratio 64, TLC 97, DLCOcor 109. No significant BD; did have midflow reversibility  04/27/2022 CT chest w contrast: unchanged, benign pericardial cyst. Irregular, scattered ground-glass opacity of the upper lobes and apices. Bandlike scarring or atelectasis in b/l lung bases.  07/10/2022 CXR: hazy left basilar opacity  02/27/2022: OV with Dr. Loanne Drilling.  Since moving to [place] in 2002, symptoms of dyspnea, cough and wheezing.  Triggered by exertion heat and smoke.  Frequent asthma exacerbations requiring steroid treatment nearly every month of 2019 and 2020 despite being compliant on steroid inhalers.  Currently steroid dependent.  Started on Fasenra in March 2021; failed after 6 months and started on Xolair in December 2021.  She still had poor control so she was switched to [place] in May 2022. She has had three exacerbations since 07/2021, which is actually improved compared to the past springtime exacerbations. Feels dupixent working well for her. No hospitalizations >2 years. Unable to tolerate Spiriva d/t cough. Decreased Advair to one puff twice a day. Asthma action plan: START DUONEBS up to four times a day for worsening shortness of breath, wheezing and cough. If you symptoms do not improve in 24-48 hours, start steroid pack that has been prescribed. Please call our office for evaluation and to let us know you steroid the steroid pack (60,50,40,30,20,10 pack).  05/06/2022: OV with Reisa Coppola NP for follow up after being seen in the  ED for asthma exacerbation on 9/25. She had shortness of breath, wheezing, chest tightness, and fevers with chills at home. CT chest showed some mild inflammatory change but no consolidative pneumonia. She was treated with methylprednisone injection and multiple DuoNebs.  She was discharged on a prednisone burst.  She contacted the office the same day because she was worried that this would not work for her.  She was prescribed a nystatin taper to complete in place of the burst and advised to schedule follow-up in our office to see how she is doing.  Today, she reports that she has been feeling much better.  Feels like the prednisone has worked well for her.  Her breathing is back to her baseline.  She has not noticed much wheezing.  She also tells me that she did have a fever prior to being seen in the ED.  They told her that it might be something viral that caused her flareup.  She was not aware of any known sick exposures.  They did test her for COVID and the flu which were both negative.  She denies any recurrence of fevers, chills, chest congestion, hemoptysis, orthopnea, lower extremity swelling.  She is currently on Breztri.  She had some samples at home from when she was seen here last and started on this.  She feels like it has worked better than anything else. She is using her neb once to twice a day. She has been off Tal[place] for a few months now; she doesn't remember exactly when she stopped but thinks it was early summer. She said she  stopped receiving patient assistance for it and needed to renew; she wasn't sure she needed to continue it since she only flares in the fall and spring with allergy seasons.   07/15/2022: OV with Dr. Loanne Drilling. Seen in ED on 12/8 for SOB and diagnosed with influenza and pneumonia. She was treated with medrol dosepak, levaquin 5 day course, and Tamiflu. She was weaned off oxygen prior to discharge. Methylprednisone didn't seem to be effective so she stopped it. She is still  SOB and wheezing. Still not on Dupixent due to financial hardships. She was treated with prednisone taper. Continued on Breztri until January - plan to change to Symbicort and Incruse in January. Message sent to pharmacy team to restart Dupixent process.  08/13/2022: Today - acute Patient presents today for acute visit.  She was last seen in December after being diagnosed with the flu and associated pneumonia.  She was treated with Levaquin course and prednisone taper.  She did have some clinical improvement but over the past two weeks, feels like her breathing worsened again.  She has been very short winded especially over the last few days.  Lungs feel congested.  She was initially coughing up brown to green sputum but yesterday and today, she has been unable to produce anything. Feels like it's stuck in her chest. She is wheezing a lot. Nebs don't seem to be helping. She feels tired. Denies fevers, chills, hemoptysis, URI symptoms, interim sick exposures. She is still on Breztri. Has yet to hear about Dupixent.   No Known Allergies  Immunization History  Administered Date(s) Administered   Influenza,inj,Quad PF,6+ Mos 05/04/2018   PFIZER(Purple Top)SARS-COV-2 Vaccination 11/06/2019, 12/22/2019, 08/21/2020   Pneumococcal Polysaccharide-23 08/08/2020    Past Medical History:  Diagnosis Date   Asthma 2008   COPD (chronic obstructive pulmonary disease) (HCC)    GERD (gastroesophageal reflux disease)    Multiple gastric ulcers    Osteoporosis    Pneumonia     Tobacco History: Social History   Tobacco Use  Smoking Status Former   Packs/day: 1.00   Years: 31.00   Total pack years: 31.00   Types: Cigarettes   Start date: 1989   Quit date: 02/24/2019   Years since quitting: 3.4  Smokeless Tobacco Never   Counseling given: Not Answered   Outpatient Medications Prior to Visit  Medication Sig Dispense Refill   albuterol (VENTOLIN HFA) 108 (90 Base) MCG/ACT inhaler INHALE 1 TO 2 PUFFS  BY MOUTH EVERY 6 HOURS AS NEEDED FOR COUGHING OR WHEEZING OR SHORTNESS OF BREATH 6.7 g 2   Budeson-Glycopyrrol-Formoterol (BREZTRI AEROSPHERE) 160-9-4.8 MCG/ACT AERO Inhale 2 puffs into the lungs in the morning and at bedtime. 2 each 0   fluticasone (FLONASE) 50 MCG/ACT nasal spray USE 1 SPRAY IN EACH NOSTRIL TWICE DAILY (Patient taking differently: Place 1 spray into both nostrils daily.) 48 g 3   ipratropium-albuterol (DUONEB) 0.5-2.5 (3) MG/3ML SOLN Take 3 mLs by nebulization every 6 (six) hours as needed. 360 mL 3   loratadine (CLARITIN) 10 MG tablet TAKE 1 Tablet BY MOUTH ONCE EVERY DAY AS NEEDED (Patient taking differently: Take 10 mg by mouth daily as needed for allergies.) 90 tablet 3   montelukast (SINGULAIR) 10 MG tablet Take 1 tablet (10 mg total) by mouth at bedtime. 90 tablet 3   alendronate (FOSAMAX) 70 MG tablet TAKE 1 TAB BY MOUTH ONCE WEEKLY IN MORNING WITH FULL GLASS OF WATER ON EMPTY STOMACH. DO NOT EAT OR LIE DOWN FOR NEXT 30 MINUTES (  Patient taking differently: Take 70 mg by mouth once a week.) 12 tablet 1   Calcium Carb-Cholecalciferol 500-600 MG-UNIT TABS Take 1 tablet by mouth 2 (two) times daily with a meal. 180 tablet 1   Cholecalciferol (VITAMIN D3) 50 MCG (2000 UT) TABS Take 2 tablets by mouth daily with breakfast.     famotidine (PEPCID) 20 MG tablet Take 20 mg by mouth 2 (two) times daily.     umeclidinium bromide (INCRUSE ELLIPTA) 62.5 MCG/ACT AEPB Inhale 1 puff into the lungs daily. (Patient not taking: Reported on 08/13/2022) 30 each 5   predniSONE (DELTASONE) 10 MG tablet Take 6 tablets x three days (60 mg), followed by 5 tablets x three days ('50mg'$ ), then 4 tablets x three day('40mg'$ ), then 3 tablets ('30mg'$ ) x three days, then 2 tablets ('20mg'$ ) x three days, then 1 tablet ('10mg'$ ) x three days, then STOP (Patient not taking: Reported on 08/13/2022) 60 tablet 0   No facility-administered medications prior to visit.     Review of Systems:   Constitutional: No weight loss or  gain, night sweats, fevers, chills, or lassitude. +fatigue HEENT: No headaches, difficulty swallowing, tooth/dental problems, or sore throat. No sneezing, itching, ear ache, nasal congestion, or post nasal drip CV:  No chest pain, orthopnea, PND, swelling in lower extremities, anasarca, dizziness, palpitations, syncope Resp: +shortness of breath; wheezing; cough; chest congestion.  No hemoptysis. No chest wall deformity GI:  No heartburn, indigestion, abdominal pain, nausea, vomiting, diarrhea, change in bowel habits, loss of appetite, bloody stools.  Skin: No rash, lesions, ulcerations MSK:  No joint pain or swelling.   Neuro: No dizziness or lightheadedness.  Psych: No depression or anxiety. Mood stable.     Physical Exam:  BP 110/74 (BP Location: Left Arm)   Pulse 98   Ht '5\' 4"'$  (1.626 m)   Wt (!) 315 lb 6.4 oz (143.1 kg)   LMP 08/05/2022   SpO2 97%   BMI 54.14 kg/m   GEN: Pleasant, interactive, well-kempt; morbidly obese; in no acute distress HEENT:  Normocephalic and atraumatic. PERRLA. Sclera white. Nasal turbinates pink, moist and patent bilaterally. No rhinorrhea present. Oropharynx pink and moist, without exudate or edema. No lesions, ulcerations, or postnasal drip.  NECK:  Supple w/ fair ROM. No JVD present. Normal carotid impulses w/o bruits. Thyroid symmetrical with no goiter or nodules palpated. No lymphadenopathy.   CV: RRR, no m/r/g, no peripheral edema. Pulses intact, +2 bilaterally. No cyanosis, pallor or clubbing. PULMONARY:  Unlabored, regular breathing. Scattered wheezes bilaterally A&P. Decreased airflow; improved post neb. No accessory muscle use.  GI: BS present and normoactive. Soft, non-tender to palpation. No organomegaly or masses detected.  MSK: No erythema, warmth or tenderness. No deformities or joint swelling noted.  Neuro: A/Ox3. No focal deficits noted.   Skin: Warm, no lesions or rashe Psych: Normal affect and behavior. Judgement and thought content  appropriate.     Lab Results:  CBC    Component Value Date/Time   WBC 5.2 07/11/2022 0429   RBC 4.50 07/11/2022 0429   HGB 13.2 07/11/2022 0429   HCT 39.9 07/11/2022 0429   PLT 303 07/11/2022 0429   MCV 88.7 07/11/2022 0429   MCH 29.3 07/11/2022 0429   MCHC 33.1 07/11/2022 0429   RDW 13.3 07/11/2022 0429   LYMPHSABS 0.8 07/11/2022 0429   MONOABS 0.2 07/11/2022 0429   EOSABS 0.0 07/11/2022 0429   BASOSABS 0.0 07/11/2022 0429    BMET    Component Value Date/Time   NA  138 07/11/2022 0429   K 3.9 07/11/2022 0429   CL 109 07/11/2022 0429   CO2 21 (L) 07/11/2022 0429   GLUCOSE 184 (H) 07/11/2022 0429   BUN 10 07/11/2022 0429   CREATININE 0.67 07/11/2022 0429   CALCIUM 7.9 (L) 07/11/2022 0429   GFRNONAA >60 07/11/2022 0429   GFRAA >60 08/23/2019 1125    BNP    Component Value Date/Time   BNP 28.0 04/27/2022 0818     Imaging:  DG Chest 2 View  Result Date: 08/13/2022 CLINICAL DATA:  asthma exacerbation; recent pna EXAM: CHEST - 2 VIEW COMPARISON:  July 10, 2022/May 06, 2022 FINDINGS: The cardiomediastinal silhouette is normal in contour. No pleural effusion. No pneumothorax. No acute pleuroparenchymal abnormality. Visualized abdomen is unremarkable. No acute osseous abnormality noted. IMPRESSION: No acute cardiopulmonary abnormality. Electronically Signed   By: Valentino Saxon M.D.   On: 08/13/2022 16:16         Latest Ref Rng & Units 10/09/2019    2:53 PM  PFT Results  FVC-Pre L 2.37  P  FVC-Predicted Pre % 62  P  FVC-Post L 2.46  P  FVC-Predicted Post % 64  P  Pre FEV1/FVC % % 62  P  Post FEV1/FCV % % 64  P  FEV1-Pre L 1.48  P  FEV1-Predicted Pre % 47  P  FEV1-Post L 1.57  P  DLCO uncorrected ml/min/mmHg 22.61  P  DLCO UNC% % 100  P  DLCO corrected ml/min/mmHg 24.64  P  DLCO COR %Predicted % 109  P  DLVA Predicted % 135  P  TLC L 5.08  P  TLC % Predicted % 97  P  RV % Predicted % 170  P    P Preliminary result    No results found for:  "NITRICOXIDE"      Assessment & Plan:   Severe persistent asthma with acute exacerbation Recurrent exacerbation, likely with mucus plugging given change in productive cough. She has significant bronchospasm on exam. Depo 120 mg inj x 1 and albuterol neb in office with improvement in airflow. We will treat her with prednisone taper. May need to consider low dose daily steroid until she starts on biologic therapy. Maximize bronchodilator and mucociliary clearance regimen. Asthma action plan in place. VS stable and non-toxic appearing. Strict return/ED precautions.  Patient Instructions  Continue Breztri 2 puffs twice daily.  Brush tongue and rinse mouth afterwards Continue Albuterol inhaler 2 puffs or 3 mL neb every 6 hours as needed for shortness of breath or wheezing. Notify if symptoms persist despite rescue inhaler/neb use.  Use your neb at least 4 times a day until symptoms improve. Continue loratadine 10 mg tablet once daily for allergies Continue montelukast (Singulair) 1 tab nightly Continue Pepcid 20 mg twice daily Continue Flonase nasal spray 1 spray each nostril daily  Prednisone taper. Take 6 tablets x three days (60 mg), followed by 5 tablets x three days ('50mg'$ ), then 4 tablets x three day('40mg'$ ), then 3 tablets ('30mg'$ ) x three days, then 2 tablets ('20mg'$ ) x three days, then 1 tablet ('10mg'$ ) x three days, then STOP.  Take in a.m. with food.  Start tomorrow. Guaifenesin 600 1200 mg twice daily for chest congestion/cough Benzonatate 1 capsule 3 times daily as needed for severe cough  Chest x-ray today.  I will call you if we needed to start you on antibiotics  If your symptoms rapidly worsen, you have trouble talking or extreme difficulty breathing, or you're not getting relief from  your nebulizer/rescue, go to the emergency department.  Follow-up in 7 to 10 days with Dr. Loanne Drilling or Roxan Diesel NP. If symptoms do not improve or worsen, please contact office for sooner follow up or seek  emergency care.    CAP (community acquired pneumonia) Influenza pneumonia. She was treated with tamiflu and levaquin, which she completed around 12/19. She was clinically improving after this and previous steroid course. Acute worsening over the past two weeks. We will repeat CXR to rule out recurrent pna.    I spent 45 minutes of dedicated to the care of this patient on the date of this encounter to include pre-visit review of records, face-to-face time with the patient discussing conditions above, post visit ordering of testing, clinical documentation with the electronic health record, making appropriate referrals as documented, and communicating necessary findings to members of the patients care team.  Clayton Bibles, NP 08/13/2022  Pt aware and understands NP's role.

## 2022-08-13 NOTE — Assessment & Plan Note (Signed)
Influenza pneumonia. She was treated with tamiflu and levaquin, which she completed around 12/19. She was clinically improving after this and previous steroid course. Acute worsening over the past two weeks. We will repeat CXR to rule out recurrent pna.

## 2022-08-13 NOTE — Telephone Encounter (Signed)
Pharm calling. PT at Pharm now but Pred has the wrong amount of pills for her to taper. Pls call [facility]. [phone]

## 2022-08-13 NOTE — Assessment & Plan Note (Addendum)
Recurrent exacerbation, likely with mucus plugging given change in productive cough. She has significant bronchospasm on exam. Depo 120 mg inj x 1 and albuterol neb in office with improvement in airflow. We will treat her with prednisone taper. May need to consider low dose daily steroid until she starts on biologic therapy. Maximize bronchodilator and mucociliary clearance regimen. Asthma action plan in place. VS stable and non-toxic appearing. Strict return/ED precautions.  Patient Instructions  Continue Breztri 2 puffs twice daily.  Brush tongue and rinse mouth afterwards Continue Albuterol inhaler 2 puffs or 3 mL neb every 6 hours as needed for shortness of breath or wheezing. Notify if symptoms persist despite rescue inhaler/neb use.  Use your neb at least 4 times a day until symptoms improve. Continue loratadine 10 mg tablet once daily for allergies Continue montelukast (Singulair) 1 tab nightly Continue Pepcid 20 mg twice daily Continue Flonase nasal spray 1 spray each nostril daily  Prednisone taper. Take 6 tablets x three days (60 mg), followed by 5 tablets x three days ('50mg'$ ), then 4 tablets x three day('40mg'$ ), then 3 tablets ('30mg'$ ) x three days, then 2 tablets ('20mg'$ ) x three days, then 1 tablet ('10mg'$ ) x three days, then STOP.  Take in a.m. with food.  Start tomorrow. Guaifenesin 600 1200 mg twice daily for chest congestion/cough Benzonatate 1 capsule 3 times daily as needed for severe cough  Chest x-ray today.  I will call you if we needed to start you on antibiotics  If your symptoms rapidly worsen, you have trouble talking or extreme difficulty breathing, or you're not getting relief from your nebulizer/rescue, go to the emergency department.  Follow-up in 7 to 10 days with Dr. Loanne Drilling or Roxan Diesel NP. If symptoms do not improve or worsen, please contact office for sooner follow up or seek emergency care.

## 2022-08-13 NOTE — Patient Instructions (Addendum)
Continue Breztri 2 puffs twice daily.  Brush tongue and rinse mouth afterwards Continue Albuterol inhaler 2 puffs or 3 mL neb every 6 hours as needed for shortness of breath or wheezing. Notify if symptoms persist despite rescue inhaler/neb use.  Use your neb at least 4 times a day until symptoms improve. Continue loratadine 10 mg tablet once daily for allergies Continue montelukast (Singulair) 1 tab nightly Continue Pepcid 20 mg twice daily Continue Flonase nasal spray 1 spray each nostril daily  Prednisone taper. Take 6 tablets x three days (60 mg), followed by 5 tablets x three days ('50mg'$ ), then 4 tablets x three day('40mg'$ ), then 3 tablets ('30mg'$ ) x three days, then 2 tablets ('20mg'$ ) x three days, then 1 tablet ('10mg'$ ) x three days, then STOP.  Take in a.m. with food.  Start tomorrow. Guaifenesin 600 1200 mg twice daily for chest congestion/cough Benzonatate 1 capsule 3 times daily as needed for severe cough  Chest x-ray today.  I will call you if we needed to start you on antibiotics  If your symptoms rapidly worsen, you have trouble talking or extreme difficulty breathing, or you're not getting relief from your nebulizer/rescue, go to the emergency department.  Follow-up in 7 to 10 days with Dr. Loanne Drilling or Roxan Diesel NP. If symptoms do not improve or worsen, please contact office for sooner follow up or seek emergency care.

## 2022-08-13 NOTE — Telephone Encounter (Signed)
PT had pneumonia last month and since then, she is SOB and lungs feel congested. She cannot seem to cough anything up though. States she has been using nebulizer frequently (3 times this day alone at time of call). No appts available in either [place] office to offer. Would like prednisone called to pharmacy. Pharmacy is [facility] on [address]. Please advise.

## 2022-08-24 ENCOUNTER — Other Ambulatory Visit: Payer: Self-pay | Admitting: Pulmonary Disease

## 2022-08-24 DIAGNOSIS — J4551 Severe persistent asthma with (acute) exacerbation: Secondary | ICD-10-CM

## 2022-08-28 ENCOUNTER — Ambulatory Visit (HOSPITAL_BASED_OUTPATIENT_CLINIC_OR_DEPARTMENT_OTHER): Payer: Medicaid Other | Admitting: Pulmonary Disease

## 2022-09-02 NOTE — Telephone Encounter (Signed)
Emailed [facility] for update on patient's Dupixent PAP enrollment status  Knox Saliva, PharmD, MPH, BCPS, CPP Clinical Pharmacist (Rheumatology and Pulmonology)

## 2022-09-24 ENCOUNTER — Ambulatory Visit (HOSPITAL_BASED_OUTPATIENT_CLINIC_OR_DEPARTMENT_OTHER): Payer: BC Managed Care – PPO | Admitting: Pulmonary Disease

## 2022-10-01 ENCOUNTER — Encounter: Payer: Self-pay | Admitting: Radiology

## 2022-10-05 NOTE — Telephone Encounter (Signed)
Per [facility], application page was filled incorrectly. Completed correctly and refaxed  Fax# [phone] Phone# [phone]  Knox Saliva, PharmD, MPH, BCPS, CPP Clinical Pharmacist (Rheumatology and Pulmonology)

## 2022-10-06 ENCOUNTER — Ambulatory Visit: Payer: Medicaid Other | Admitting: Physician Assistant

## 2022-10-09 ENCOUNTER — Telehealth (HOSPITAL_BASED_OUTPATIENT_CLINIC_OR_DEPARTMENT_OTHER): Payer: Self-pay | Admitting: Pulmonary Disease

## 2022-10-09 MED ORDER — PREDNISONE 10 MG PO TABS: ORAL_TABLET | ORAL | 0 refills | Status: DC

## 2022-10-09 NOTE — Telephone Encounter (Signed)
Going on for about 2 days using benzonatate to stop coughing and used neb machine 7 times today.

## 2022-10-09 NOTE — Telephone Encounter (Signed)
She reports worsening coughing, wheezing and shortness of breath. Denies recent sick contacts. The change in weather triggered it. Previously doing well before this. Has tried tessalon perles, mucinex and back-to-back nebulizers. Does not have pulse oximeter. Able to speak in full sentences on the phone.  Asthma exacerbation Prednisone taper ordered Advised to present to ED for worsening symptoms Work note excuse written

## 2022-10-09 NOTE — Telephone Encounter (Signed)
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 Please advise and call pt back.   [facility] [address] P: [phone]

## 2022-10-12 ENCOUNTER — Other Ambulatory Visit (HOSPITAL_COMMUNITY): Payer: Self-pay

## 2022-10-12 NOTE — Telephone Encounter (Signed)
Per fax from [facility], patient's [place] rx has been triaged to [facility]. Patient appears to have [employer] plan now. Attempted to start PA for Dupixent through [facility] however response is received: Cannot find matching patient with Name and Date of Birth provided  Initiated PA over the phone today. Clinicals sent via fax  Ref #: [id number] Phone: [phone] Fax: [phone]  Knox Saliva, PharmD, MPH, BCPS, CPP Clinical Pharmacist (Rheumatology and Pulmonology)

## 2022-10-13 NOTE — Telephone Encounter (Signed)
Received fax from [employer] with copay card: BIN: [id number] PCN: [id number] Group: [id number] ID: [id number]  PA determination for Dupixent still pending from yesterday.  Knox Saliva, PharmD, MPH, BCPS, CPP Clinical Pharmacist (Rheumatology and Pulmonology)

## 2022-10-15 ENCOUNTER — Other Ambulatory Visit: Payer: Self-pay | Admitting: Physician Assistant

## 2022-10-15 ENCOUNTER — Other Ambulatory Visit: Payer: Self-pay | Admitting: Pulmonary Disease

## 2022-10-15 DIAGNOSIS — J4551 Severe persistent asthma with (acute) exacerbation: Secondary | ICD-10-CM

## 2022-10-21 ENCOUNTER — Other Ambulatory Visit (HOSPITAL_COMMUNITY): Payer: Self-pay

## 2022-10-21 MED ORDER — DUPIXENT 300 MG/2ML ~~LOC~~ SOAJ: 300.0000 mg | SUBCUTANEOUS | 5 refills | Status: DC

## 2022-10-21 NOTE — Telephone Encounter (Signed)
Called insurance for status update on PA. Per test claim, copay is $665.04 for 28 day supply and patient must use [place] [phone])  Prior authorization for Dupixent is approved from 10/12/2022 to 04/14/2023  Case # [id number]  Rx has been sent to [facility] today. ATC patient to discuss phone unable to reach. Left VM advising of approval. MyChart message sent to pt. Will continue to f/u  Knox Saliva, PharmD, MPH, BCPS, CPP Clinical Pharmacist (Rheumatology and Pulmonology)

## 2022-10-22 ENCOUNTER — Other Ambulatory Visit: Payer: Self-pay | Admitting: Pulmonary Disease

## 2022-10-22 DIAGNOSIS — J4551 Severe persistent asthma with (acute) exacerbation: Secondary | ICD-10-CM

## 2022-10-28 NOTE — Telephone Encounter (Signed)
Per dispense history, Dupixent filled on 10/21/2022  Knox Saliva, PharmD, MPH, BCPS, CPP Clinical Pharmacist (Rheumatology and Pulmonology)

## 2022-11-03 ENCOUNTER — Telehealth: Payer: Self-pay | Admitting: Pulmonary Disease

## 2022-11-03 NOTE — Telephone Encounter (Signed)
Received a call from [place] about pt's [place]. Pt stated to them that she has not had her Dupixent since May 2023.  Due to this, the pharmacy needs to know if pt will need loading dose.  Routing to pharmacy team for review.

## 2022-11-03 NOTE — Telephone Encounter (Signed)
Returned call to pharmacy. Patient does not need to reload for Dupixent. Spoke with Percell Miller, [facility]. Nothing further needed.  Knox Saliva, PharmD, MPH, BCPS, CPP Clinical Pharmacist (Rheumatology and Pulmonology)

## 2022-11-11 ENCOUNTER — Telehealth: Payer: Self-pay | Admitting: "Endocrinology

## 2022-11-11 NOTE — Telephone Encounter (Signed)
Medical Records request from [place], Nedra Hai, [place], [facility] - sent to [facility] Medical Records Release

## 2022-11-19 ENCOUNTER — Telehealth (HOSPITAL_BASED_OUTPATIENT_CLINIC_OR_DEPARTMENT_OTHER): Payer: Self-pay | Admitting: Pulmonary Disease

## 2022-11-19 MED ORDER — PREDNISONE 10 MG PO TABS: ORAL_TABLET | ORAL | 0 refills | Status: DC

## 2022-11-19 NOTE — Telephone Encounter (Signed)
Patient called back. She wanted to make sure her message had been sent to Dr. Everardo All. I advised her that the message had been sent and we are waiting for Dr. Everardo All to respond. I made her aware that Dr. Everardo All is in clinic today. She verbalized understanding.   Will await Dr. George Hugh response.

## 2022-11-19 NOTE — Telephone Encounter (Signed)
Spk with pt states she can't breathe started at work around [employer]. Did 2 neb treatments at work and didn't open her up.   [facility] [place]

## 2022-11-19 NOTE — Telephone Encounter (Signed)
Not feeling well and having a hard time breathing. Wanting to know if she coul dhave something called in by Dr. Everardo All. Declined appointment for today since her copay is $75 and she does not have that. Please advise and call patient back.  Pharmacy: [facility]

## 2022-11-19 NOTE — Telephone Encounter (Signed)
--  She restarted her first dose of Dupixent since > year ago yesterday. --She has been out of inhalers for a month. Ran out of Breztri samples at that time and did not fill her Symbicort and Spiriva --She has completed breathing treatments >8 times today  Prednisone taper has been ordered but she has been advised to present to the ED for evaluation and possible admission for asthma exacerbation. She is risk for respiratory failure. She expresses understanding and knows to go to the ED now

## 2022-11-25 ENCOUNTER — Other Ambulatory Visit (HOSPITAL_BASED_OUTPATIENT_CLINIC_OR_DEPARTMENT_OTHER): Payer: Self-pay | Admitting: Pulmonary Disease

## 2022-11-27 ENCOUNTER — Telehealth: Payer: Self-pay | Admitting: Pulmonary Disease

## 2022-11-27 ENCOUNTER — Other Ambulatory Visit: Payer: Self-pay | Admitting: Pulmonary Disease

## 2022-11-27 DIAGNOSIS — J4551 Severe persistent asthma with (acute) exacerbation: Secondary | ICD-10-CM

## 2022-11-27 MED ORDER — IPRATROPIUM-ALBUTEROL 0.5-2.5 (3) MG/3ML IN SOLN: 3.0000 mL | Freq: Four times a day (QID) | RESPIRATORY_TRACT | 3 refills | Status: DC | PRN

## 2022-11-27 MED ORDER — ALBUTEROL SULFATE HFA 108 (90 BASE) MCG/ACT IN AERS: INHALATION_SPRAY | RESPIRATORY_TRACT | 2 refills | Status: DC

## 2022-11-27 NOTE — Telephone Encounter (Signed)
Patient called to request a refill for her nebulizer solution and her albuterol.  She stated that the pharmacy does not have the script yet.  Please advise and call patient to discuss further if needed.  CB3 [phone]

## 2022-11-27 NOTE — Telephone Encounter (Signed)
[facility] phar. Calling needs instructions on inhaler and wants to know if it has to be brand namealbuterol (VENTOLIN HFA) 108 (90 Base) MCG/ACT inhaler

## 2022-11-27 NOTE — Telephone Encounter (Signed)
Called and spoke with patient. Patient stated she needed her neb solution and inhaler to be sent to her pharmacy. Patient verified pharmacy. Prescriptions have been sent. Nothing further needed.

## 2022-12-01 NOTE — Telephone Encounter (Signed)
[facility] calling stating Albuterol RX went in w/o directions (Puffs per day) . It just said "Albuterol" Please send in correction. TY

## 2022-12-06 NOTE — Telephone Encounter (Signed)
Could we please reach out to pharmacy to get the prescription fixed for pt. Needs further direction

## 2022-12-08 MED ORDER — ALBUTEROL SULFATE HFA 108 (90 BASE) MCG/ACT IN AERS: 2.0000 | INHALATION_SPRAY | RESPIRATORY_TRACT | 2 refills | Status: DC | PRN

## 2022-12-08 NOTE — Telephone Encounter (Signed)
Called and spoke with [facility] pharmacy. They pharmacy tech stated that she did not see an order for albuterol for th epatient. Advised pharmacy tech I would resend the rx to them with the instructions on how many puffs a day.   JE, please advise how many puffs and which albuterol inhaler.

## 2022-12-08 NOTE — Telephone Encounter (Signed)
Albuterol inhaler with instructions refilled. Pharmacy instructions to distribute brand preferred by insurance. Substitution ok

## 2023-01-20 ENCOUNTER — Other Ambulatory Visit: Payer: Self-pay | Admitting: Pulmonary Disease

## 2023-01-20 DIAGNOSIS — J4551 Severe persistent asthma with (acute) exacerbation: Secondary | ICD-10-CM

## 2023-03-18 ENCOUNTER — Other Ambulatory Visit (HOSPITAL_BASED_OUTPATIENT_CLINIC_OR_DEPARTMENT_OTHER): Payer: Self-pay

## 2023-03-18 ENCOUNTER — Telehealth (HOSPITAL_BASED_OUTPATIENT_CLINIC_OR_DEPARTMENT_OTHER): Payer: Self-pay | Admitting: Pulmonary Disease

## 2023-03-18 ENCOUNTER — Encounter (HOSPITAL_BASED_OUTPATIENT_CLINIC_OR_DEPARTMENT_OTHER): Payer: Self-pay | Admitting: Emergency Medicine

## 2023-03-18 ENCOUNTER — Emergency Department (HOSPITAL_BASED_OUTPATIENT_CLINIC_OR_DEPARTMENT_OTHER)
Admission: EM | Admit: 2023-03-18 | Discharge: 2023-03-18 | Disposition: A | Discharge status: Home / Self Care | Payer: BC Managed Care – PPO | Attending: Emergency Medicine | Admitting: Emergency Medicine

## 2023-03-18 ENCOUNTER — Other Ambulatory Visit: Payer: Self-pay

## 2023-03-18 DIAGNOSIS — J45909 Unspecified asthma, uncomplicated: Secondary | ICD-10-CM | POA: Diagnosis not present

## 2023-03-18 DIAGNOSIS — J449 Chronic obstructive pulmonary disease, unspecified: Secondary | ICD-10-CM | POA: Insufficient documentation

## 2023-03-18 DIAGNOSIS — Z7952 Long term (current) use of systemic steroids: Secondary | ICD-10-CM | POA: Insufficient documentation

## 2023-03-18 DIAGNOSIS — U071 COVID-19: Secondary | ICD-10-CM | POA: Diagnosis not present

## 2023-03-18 DIAGNOSIS — J9801 Acute bronchospasm: Secondary | ICD-10-CM | POA: Insufficient documentation

## 2023-03-18 DIAGNOSIS — R059 Cough, unspecified: Secondary | ICD-10-CM | POA: Diagnosis present

## 2023-03-18 LAB — RESP PANEL BY RT-PCR (RSV, FLU A&B, COVID)  RVPGX2
Influenza A by PCR: NEGATIVE
Influenza B by PCR: NEGATIVE
Resp Syncytial Virus by PCR: NEGATIVE
SARS Coronavirus 2 by RT PCR: POSITIVE — AB

## 2023-03-18 MED ORDER — PREDNISONE 50 MG PO TABS
50.0000 mg | ORAL_TABLET | Freq: Every day | ORAL | 0 refills | Status: DC
  Filled 2023-03-18: qty 5, 5d supply, fill #0

## 2023-03-18 MED ORDER — PAXLOVID (300/100) 20 X 150 MG & 10 X 100MG PO TBPK
3.0000 | ORAL_TABLET | Freq: Two times a day (BID) | ORAL | 0 refills | Status: AC
  Filled 2023-03-18: qty 30, 5d supply, fill #0

## 2023-03-18 MED ORDER — PREDNISONE 50 MG PO TABS
60.0000 mg | ORAL_TABLET | Freq: Once | ORAL | Status: AC
  Administered 2023-03-18: 60 mg via ORAL
  Filled 2023-03-18: qty 1

## 2023-03-18 NOTE — Telephone Encounter (Signed)
Agree with plan 

## 2023-03-18 NOTE — ED Provider Notes (Signed)
[place] EMERGENCY DEPARTMENT AT [facility] Provider Note   CSN: [id number] Arrival date & time: 03/18/23  1458     History  Chief Complaint  Patient presents with   Covid Positive    Linda Ellis is a 47 y.o. female.  HPI   Patient has a history of ulcers COPD reflux asthma.  Patient presents to the ED with complaints of myalgias coughing and fatigue that started yesterday.  Patient states she felt like she was wheezing.  She took a DuoNeb prior to arrival.  Patient did a home test for COVID and it was positive.  She spoke to her doctor and was instructed to come to the ED.  Home Medications Prior to Admission medications   Medication Sig Start Date End Date Taking? Authorizing Provider  nirmatrelvir & ritonavir (PAXLOVID, 300/100,) 20 x 150 MG & 10 x 100MG  TBPK Take 3 tablets by mouth 2 (two) times daily for 5 days. 03/18/23 03/23/23 Yes Linwood Dibbles, MD  predniSONE (DELTASONE) 50 MG tablet Take 1 tablet (50 mg total) by mouth daily. 03/18/23  Yes Linwood Dibbles, MD  albuterol (VENTOLIN HFA) 108 (90 Base) MCG/ACT inhaler INHALE 2 PUFFS INTO THE LUNGS EVERY 4 HOURS AS NEEDED FOR WHEEZING OR SHORTNESS OF BREATH 01/21/23   Luciano Cutter, MD  alendronate (FOSAMAX) 70 MG tablet TAKE 1 TAB BY MOUTH ONCE WEEKLY IN MORNING WITH FULL GLASS OF WATER ON EMPTY STOMACH. DO NOT EAT OR LIE DOWN FOR NEXT 30 MINUTES Patient taking differently: Take 70 mg by mouth once a week. 04/21/22   Roma Kayser, MD  azithromycin (ZITHROMAX) 250 MG tablet Take 2 tablets (500 mg) on day one then 1 tablet daily for four additional days 08/13/22   Cobb, Ruby Cola, NP  benzonatate (TESSALON) 200 MG capsule Take 1 capsule (200 mg total) by mouth 3 (three) times daily as needed for cough. 08/13/22   Cobb, Ruby Cola, NP  Budeson-Glycopyrrol-Formoterol (BREZTRI AEROSPHERE) 160-9-4.8 MCG/ACT AERO Inhale 2 puffs into the lungs in the morning and at bedtime. 05/06/22   Cobb, Ruby Cola, NP  Calcium  Carb-Cholecalciferol 500-600 MG-UNIT TABS Take 1 tablet by mouth 2 (two) times daily with a meal. 07/08/20   Nida, Denman George, MD  Cholecalciferol (VITAMIN D3) 50 MCG (2000 UT) TABS Take 2 tablets by mouth daily with breakfast.    [provider]  Dupilumab (DUPIXENT) 300 MG/2ML SOPN Inject 300 mg into the skin every 14 (fourteen) days. **on maintenance dose** 10/21/22   Luciano Cutter, MD  famotidine (PEPCID) 20 MG tablet Take 20 mg by mouth 2 (two) times daily.    [provider]  fluticasone (FLONASE) 50 MCG/ACT nasal spray USE 1 SPRAY IN EACH NOSTRIL TWICE DAILY Patient taking differently: Place 1 spray into both nostrils daily. 04/20/22   Jacquelin Hawking, PA-C  ipratropium-albuterol (DUONEB) 0.5-2.5 (3) MG/3ML SOLN USE 3 ML VIA NEBULIZER EVERY 6 HOURS AS NEEDED 12/01/22   Luciano Cutter, MD  ipratropium-albuterol (DUONEB) 0.5-2.5 (3) MG/3ML SOLN Take 3 mLs by nebulization every 6 (six) hours as needed. 11/27/22   Luciano Cutter, MD  loratadine (CLARITIN) 10 MG tablet TAKE 1 Tablet BY MOUTH ONCE EVERY DAY AS NEEDED Patient taking differently: Take 10 mg by mouth daily as needed for allergies. 04/20/22   Jacquelin Hawking, PA-C  montelukast (SINGULAIR) 10 MG tablet Take 1 tablet (10 mg total) by mouth at bedtime. 07/15/22   Luciano Cutter, MD  umeclidinium bromide (INCRUSE ELLIPTA) 62.5 MCG/ACT  AEPB Inhale 1 puff into the lungs daily. Patient not taking: Reported on 08/13/2022 07/15/22   Luciano Cutter, MD  ALBUTEROL IN Inhale 2 puffs into the lungs as needed. For shortness of breath   10/24/11  [provider]      Allergies    Patient has no known allergies.    Review of Systems   Review of Systems  Physical Exam Updated Vital Signs BP 128/79   Pulse 99   Temp 98.3 F (36.8 C) (Oral)   Resp (!) 22   LMP 03/07/2023   SpO2 97%   Breastfeeding No  Physical Exam Vitals and nursing note reviewed.  Constitutional:      General: She is not in  acute distress.    Appearance: She is well-developed. She is not ill-appearing.  HENT:     Head: Normocephalic and atraumatic.     Right Ear: External ear normal.     Left Ear: External ear normal.  Eyes:     General: No scleral icterus.       Right eye: No discharge.        Left eye: No discharge.     Conjunctiva/sclera: Conjunctivae normal.  Neck:     Trachea: No tracheal deviation.  Cardiovascular:     Rate and Rhythm: Normal rate.  Pulmonary:     Effort: Pulmonary effort is normal. No respiratory distress.     Breath sounds: No stridor. Wheezing present.     Comments: Very faint wheeze noted sporadically on an exhalation, patient speaking in full sentences without difficulty Abdominal:     General: There is no distension.  Musculoskeletal:        General: No swelling or deformity.     Cervical back: Neck supple.  Skin:    General: Skin is warm and dry.     Findings: No rash.  Neurological:     Mental Status: She is alert. Mental status is at baseline.     Cranial Nerves: No dysarthria or facial asymmetry.     Motor: No seizure activity.     ED Results / Procedures / Treatments   Labs (all labs ordered are listed, but only abnormal results are displayed) Labs Reviewed  RESP PANEL BY RT-PCR (RSV, FLU A&B, COVID)  [id number]    EKG None  Radiology No results found.  Procedures Procedures    Medications Ordered in ED Medications  predniSONE (DELTASONE) tablet 60 mg (has no administration in time range)    ED Course/ Medical Decision Making/ A&P                                 Medical Decision Making Risk Prescription drug management.   Patient had a positive home COVID test yesterday.  Patient does have risk factors of pulmonary disease.  She is breathing easily here.  She is not tachypneic.  She does not have an oxygen requirement.  Will start her on a course of steroids as I think she does have a component of mild bronchospasm.  She will continue her  breathing treatments.  Will start her on a course of Paxlovid.  Patient states she has taken it before and tolerated it well.        Final Clinical Impression(s) / ED Diagnoses Final diagnoses:  COVID  Bronchospasm    Rx / DC Orders ED Discharge Orders          Ordered  nirmatrelvir & ritonavir (PAXLOVID, 300/100,) 20 x 150 MG & 10 x 100MG  TBPK  2 times daily        03/18/23 1532    predniSONE (DELTASONE) 50 MG tablet  Daily        03/18/23 1532              Linwood Dibbles, MD 03/18/23 1535

## 2023-03-18 NOTE — Discharge Instructions (Addendum)
Rest and drink plenty of fluids.  Take over-the-counter medications as needed for aches and pains.  Take the steroids to help with your wheezing and continue your breathing treatments.  Return as needed for worsening symptoms.

## 2023-03-18 NOTE — ED Notes (Signed)
Asked by RN to assess pt with +COVID test at home with little relief from home MDIs/neb.  PT stated she took Duo neb 1 hour prior to coming in with relief briefly.  BBS diminished with faint exp wheeze.  No distress noted, Sats 98% on RA, HR 88.  Will wait for MD to assess.

## 2023-03-18 NOTE — ED Triage Notes (Signed)
Sick with body aches fever HA and sob  Started last night +home covid test  Minimal relief with home neb treatment

## 2023-03-18 NOTE — Telephone Encounter (Signed)
I spoke with patient and have advised her to be seen at [facility] for evaluation. She has agreed.

## 2023-03-18 NOTE — Telephone Encounter (Signed)
Patient stated tested positive for COVID- needs something to help with breathing. Any thing we can do for her?   Tight chest and shortness of breath, coughing, fever

## 2023-03-19 ENCOUNTER — Encounter (HOSPITAL_BASED_OUTPATIENT_CLINIC_OR_DEPARTMENT_OTHER): Payer: Self-pay | Admitting: Pulmonary Disease

## 2023-03-19 ENCOUNTER — Other Ambulatory Visit (HOSPITAL_BASED_OUTPATIENT_CLINIC_OR_DEPARTMENT_OTHER): Payer: Self-pay

## 2023-03-22 ENCOUNTER — Telehealth: Payer: Self-pay | Admitting: Pulmonary Disease

## 2023-03-22 NOTE — Telephone Encounter (Signed)
Pt calling in bc she went to the Er and they took her out of work and now she needs her paperwork filled out in regards to her conditions

## 2023-03-22 NOTE — Telephone Encounter (Signed)
Please contact patient to submit (?FMLA/employee paperwork) at the [address] office for processing.   Mardella Layman, please schedule patient for follow-up with in any blocked spot

## 2023-03-22 NOTE — Telephone Encounter (Signed)
Scheduled patient for follow up with Dr. Everardo All. Patient also states dropped off paperwork this morning with [employer]. Patient is needing paperwork signed and completed by Sept 5th with dates off work, and letter from ED excusing her from work.

## 2023-03-27 ENCOUNTER — Other Ambulatory Visit: Payer: Self-pay | Admitting: Pulmonary Disease

## 2023-03-30 NOTE — Telephone Encounter (Signed)
Is paperwork available for me to sign? She is scheduled to be seen in clinic with me on 03/30/23. Please fax to [facility] office

## 2023-03-31 ENCOUNTER — Telehealth: Payer: Self-pay | Admitting: Pulmonary Disease

## 2023-03-31 NOTE — Telephone Encounter (Signed)
Filled out FMLA form and emailed to Dr. Everardo All so she can review it with the patient at her 04/01/23 appointment.  Last FMLA form was completed in July 2023.

## 2023-04-01 ENCOUNTER — Other Ambulatory Visit (HOSPITAL_COMMUNITY): Payer: Self-pay

## 2023-04-01 ENCOUNTER — Ambulatory Visit (INDEPENDENT_AMBULATORY_CARE_PROVIDER_SITE_OTHER): Payer: BC Managed Care – PPO | Admitting: Pulmonary Disease

## 2023-04-01 ENCOUNTER — Encounter (HOSPITAL_BASED_OUTPATIENT_CLINIC_OR_DEPARTMENT_OTHER): Payer: Self-pay | Admitting: Pulmonary Disease

## 2023-04-01 DIAGNOSIS — J4550 Severe persistent asthma, uncomplicated: Secondary | ICD-10-CM

## 2023-04-01 DIAGNOSIS — Z7952 Long term (current) use of systemic steroids: Secondary | ICD-10-CM | POA: Diagnosis not present

## 2023-04-01 MED ORDER — IPRATROPIUM-ALBUTEROL 0.5-2.5 (3) MG/3ML IN SOLN: 3.0000 mL | Freq: Four times a day (QID) | RESPIRATORY_TRACT | 3 refills | Status: DC | PRN

## 2023-04-01 MED ORDER — ALBUTEROL SULFATE HFA 108 (90 BASE) MCG/ACT IN AERS: 2.0000 | INHALATION_SPRAY | RESPIRATORY_TRACT | 3 refills | Status: DC | PRN

## 2023-04-01 MED ORDER — BUDESONIDE-FORMOTEROL FUMARATE 160-4.5 MCG/ACT IN AERO: 2.0000 | INHALATION_SPRAY | Freq: Two times a day (BID) | RESPIRATORY_TRACT | 5 refills | Status: DC

## 2023-04-01 NOTE — Telephone Encounter (Signed)
Completed FMLA paperwork in-clinic today and given to patient after office copy made.

## 2023-04-01 NOTE — Patient Instructions (Addendum)
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  ORDER Breyna 160-4.5 mcg TWO puffs TWICE a day. Rinse mouth out after use CONTINUE Albuterol as needed for shortness of breath or wheezing Self discontinued montelukast CONTINUE Duonebs as needed. REFILL Continue regular exercise daily  Asthma Action Plan CONTINUE DUONEBS up to four times a day for worsening shortness of breath, wheezing and cough. If you symptoms do not improve in 24-48 hours, start steroid pack that has been prescribed. Please call our office for evaluation and to let us know you steroid the steroid pack (60,50,40,30,20,10 pack).  FMLA paperwork filled out
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
## 2023-04-06 ENCOUNTER — Other Ambulatory Visit: Payer: Self-pay | Admitting: Pulmonary Disease

## 2023-04-06 DIAGNOSIS — J4551 Severe persistent asthma with (acute) exacerbation: Secondary | ICD-10-CM

## 2023-04-07 DIAGNOSIS — Z0289 Encounter for other administrative examinations: Secondary | ICD-10-CM

## 2023-04-07 NOTE — Telephone Encounter (Signed)
Dr. Everardo All completed the FMLA form and I faxed to [facility] [employer] - patient's employer - fax# [phone].   Copy of signed form was given to patient during her appointment.

## 2023-04-12 ENCOUNTER — Encounter (HOSPITAL_BASED_OUTPATIENT_CLINIC_OR_DEPARTMENT_OTHER): Payer: Self-pay | Admitting: Pulmonary Disease

## 2023-04-12 NOTE — Telephone Encounter (Signed)
Patient seen 04/01/23. Mention of a steroid pack, but no rx in chart.  Asthma Action Plan CONTINUE DUONEBS up to four times a day for worsening shortness of breath, wheezing and cough. If you symptoms do not improve in 24-48 hours, start steroid pack that has been prescribed. Please call our office for evaluation and to let us know you steroid the steroid pack (60,50,40,30,20,10 pack).  Please advise, thank you!

## 2023-04-13 MED ORDER — PREDNISONE 10 MG PO TABS: ORAL_TABLET | ORAL | 0 refills | Status: AC

## 2023-04-13 MED ORDER — HYDROCOD POLI-CHLORPHE POLI ER 10-8 MG/5ML PO SUER: 5.0000 mL | Freq: Every evening | ORAL | 0 refills | Status: DC | PRN

## 2023-04-13 NOTE — Telephone Encounter (Signed)
Refill sent for DUPIXENT to  [facility]  Dose: 300mg  SQ every 14 days  Last OV: 04/01/23 Provider: Dr. Everardo All  Next OV: 06/30/2023  Routing to scheduling team for follow-up on appt scheduling  Chesley Mires, PharmD, MPH, BCPS Clinical Pharmacist (Rheumatology and Pulmonology)

## 2023-04-16 ENCOUNTER — Telehealth (HOSPITAL_BASED_OUTPATIENT_CLINIC_OR_DEPARTMENT_OTHER): Payer: Self-pay

## 2023-04-16 ENCOUNTER — Telehealth: Payer: Self-pay | Admitting: Pulmonary Disease

## 2023-04-16 NOTE — Telephone Encounter (Signed)
Pharmacy is calling for pre authorization of medication; dupixent 300mg  per ml. Key covermymeds [id number]

## 2023-04-16 NOTE — Telephone Encounter (Signed)
PA needed for dupixent . Key : [id number]

## 2023-04-19 NOTE — Telephone Encounter (Signed)
Attempted to start a Prior Authorization request to [facility] for DUPIXENT via CoverMyMeds. Will update once we receive a response.  Key: [id number]   Attempted to start PA for Dupixent through [facility] however response is received: Cannot find matching patient with Name and Date of Birth provided   Will need to call insurance to initiate over the phone  Phone: [phone] Fax: [phone]  Chesley Mires, PharmD, MPH, BCPS, CPP Clinical Pharmacist (Rheumatology and Pulmonology)

## 2023-04-22 NOTE — Telephone Encounter (Signed)
[phone] Verbal Prior Auth line @ [facility]

## 2023-04-23 NOTE — Telephone Encounter (Signed)
Called [facility] to initiate PA for Dupixent. Clinicals sent via fax   Ref #: [id number] Phone: [phone] Fax: [phone]  Chesley Mires, PharmD, MPH, BCPS, CPP Clinical Pharmacist (Rheumatology and Pulmonology)

## 2023-05-04 NOTE — Telephone Encounter (Addendum)
Called [facility] for update on patient's Dupixent PA. Per rep, Dupixent is approved from 04/23/2023 to 04/22/2024. Rep will refax approval letter to clinic  Ref #: [id number] (previous case #  denied and they re-initiated a brand new PA with new key that was subsequently approved) Phone: [phone] Fax: [phone]  [facility] to request rx for Dupixent be re-processed. Rx was shipped on 05/01/23 to arrive to patient's home on 05/02/23. NFN  Chesley Mires, PharmD, MPH, BCPS, CPP Clinical Pharmacist (Rheumatology and Pulmonology)

## 2023-06-06 ENCOUNTER — Other Ambulatory Visit (HOSPITAL_BASED_OUTPATIENT_CLINIC_OR_DEPARTMENT_OTHER): Payer: Self-pay | Admitting: Pulmonary Disease

## 2023-06-06 DIAGNOSIS — J4550 Severe persistent asthma, uncomplicated: Secondary | ICD-10-CM

## 2023-06-20 ENCOUNTER — Other Ambulatory Visit: Payer: Self-pay

## 2023-06-20 ENCOUNTER — Emergency Department (HOSPITAL_BASED_OUTPATIENT_CLINIC_OR_DEPARTMENT_OTHER): Payer: BC Managed Care – PPO

## 2023-06-20 ENCOUNTER — Emergency Department (HOSPITAL_BASED_OUTPATIENT_CLINIC_OR_DEPARTMENT_OTHER)
Admission: EM | Admit: 2023-06-20 | Discharge: 2023-06-20 | Disposition: A | Discharge status: Home / Self Care | Payer: BC Managed Care – PPO | Attending: Emergency Medicine | Admitting: Emergency Medicine

## 2023-06-20 DIAGNOSIS — J45909 Unspecified asthma, uncomplicated: Secondary | ICD-10-CM | POA: Insufficient documentation

## 2023-06-20 DIAGNOSIS — Z79899 Other long term (current) drug therapy: Secondary | ICD-10-CM | POA: Diagnosis not present

## 2023-06-20 DIAGNOSIS — R062 Wheezing: Secondary | ICD-10-CM | POA: Insufficient documentation

## 2023-06-20 DIAGNOSIS — Z7951 Long term (current) use of inhaled steroids: Secondary | ICD-10-CM | POA: Insufficient documentation

## 2023-06-20 DIAGNOSIS — J449 Chronic obstructive pulmonary disease, unspecified: Secondary | ICD-10-CM | POA: Insufficient documentation

## 2023-06-20 DIAGNOSIS — R059 Cough, unspecified: Secondary | ICD-10-CM | POA: Insufficient documentation

## 2023-06-20 DIAGNOSIS — R0602 Shortness of breath: Secondary | ICD-10-CM | POA: Diagnosis not present

## 2023-06-20 DIAGNOSIS — Z1152 Encounter for screening for COVID-19: Secondary | ICD-10-CM | POA: Insufficient documentation

## 2023-06-20 LAB — CBC WITH DIFFERENTIAL/PLATELET
Abs Immature Granulocytes: 0.04 10*3/uL (ref 0.00–0.07)
Basophils Absolute: 0.1 10*3/uL (ref 0.0–0.1)
Basophils Relative: 1 %
Eosinophils Absolute: 0.2 10*3/uL (ref 0.0–0.5)
Eosinophils Relative: 2 %
HCT: 38.5 % (ref 36.0–46.0)
Hemoglobin: 13.2 g/dL (ref 12.0–15.0)
Immature Granulocytes: 0 %
Lymphocytes Relative: 44 %
Lymphs Abs: 5.4 10*3/uL — ABNORMAL HIGH (ref 0.7–4.0)
MCH: 30.3 pg (ref 26.0–34.0)
MCHC: 34.3 g/dL (ref 30.0–36.0)
MCV: 88.3 fL (ref 80.0–100.0)
Monocytes Absolute: 0.7 10*3/uL (ref 0.1–1.0)
Monocytes Relative: 6 %
Neutro Abs: 5.8 10*3/uL (ref 1.7–7.7)
Neutrophils Relative %: 47 %
Platelets: 374 10*3/uL (ref 150–400)
RBC: 4.36 MIL/uL (ref 3.87–5.11)
RDW: 13.3 % (ref 11.5–15.5)
WBC: 12.2 10*3/uL — ABNORMAL HIGH (ref 4.0–10.5)
nRBC: 0 % (ref 0.0–0.2)

## 2023-06-20 LAB — BASIC METABOLIC PANEL
Anion gap: 5 (ref 5–15)
BUN: 10 mg/dL (ref 6–20)
CO2: 28 mmol/L (ref 22–32)
Calcium: 8.2 mg/dL — ABNORMAL LOW (ref 8.9–10.3)
Chloride: 102 mmol/L (ref 98–111)
Creatinine, Ser: 0.71 mg/dL (ref 0.44–1.00)
GFR, Estimated: >60 mL/min (ref >60)
Glucose, Bld: 91 mg/dL (ref 70–99)
Potassium: 4.6 mmol/L (ref 3.5–5.1)
Sodium: 135 mmol/L (ref 135–145)

## 2023-06-20 LAB — SARS CORONAVIRUS 2 BY RT PCR: SARS Coronavirus 2 by RT PCR: NEGATIVE

## 2023-06-20 MED ORDER — METHYLPREDNISOLONE SODIUM SUCC 125 MG IJ SOLR
INTRAMUSCULAR | Status: AC
  Filled 2023-06-20: qty 2

## 2023-06-20 MED ORDER — ALBUTEROL SULFATE (2.5 MG/3ML) 0.083% IN NEBU
10.0000 mg | INHALATION_SOLUTION | Freq: Once | RESPIRATORY_TRACT | Status: AC
  Administered 2023-06-20: 10 mg via RESPIRATORY_TRACT
  Filled 2023-06-20: qty 12

## 2023-06-20 MED ORDER — ALBUTEROL SULFATE (2.5 MG/3ML) 0.083% IN NEBU: 5.0000 mg | INHALATION_SOLUTION | Freq: Once | RESPIRATORY_TRACT | Status: DC

## 2023-06-20 MED ORDER — MAGNESIUM SULFATE 2 GM/50ML IV SOLN
2.0000 g | Freq: Once | INTRAVENOUS | Status: AC
  Administered 2023-06-20: 2 g via INTRAVENOUS
  Filled 2023-06-20: qty 50

## 2023-06-20 MED ORDER — SODIUM CHLORIDE 0.9 % IV SOLN
250.0000 mg | Freq: Once | INTRAVENOUS | Status: AC
  Administered 2023-06-20: 250 mg via INTRAVENOUS
  Filled 2023-06-20: qty 4

## 2023-06-20 MED ORDER — IPRATROPIUM-ALBUTEROL 0.5-2.5 (3) MG/3ML IN SOLN
3.0000 mL | Freq: Once | RESPIRATORY_TRACT | Status: AC
  Administered 2023-06-20: 3 mL via RESPIRATORY_TRACT
  Filled 2023-06-20: qty 3

## 2023-06-20 MED ORDER — ALBUTEROL SULFATE (2.5 MG/3ML) 0.083% IN NEBU
2.5000 mg | INHALATION_SOLUTION | Freq: Once | RESPIRATORY_TRACT | Status: AC
  Administered 2023-06-20: 2.5 mg via RESPIRATORY_TRACT
  Filled 2023-06-20: qty 3

## 2023-06-20 MED ORDER — PREDNISONE 50 MG PO TABS: ORAL_TABLET | ORAL | 0 refills | Status: DC

## 2023-06-20 NOTE — ED Notes (Signed)
Pt lungs sound much better. Pt Sated that can tell a big difference.

## 2023-06-20 NOTE — ED Notes (Signed)
Dc instructions reviewed with patient. Patient voiced understanding. Dc with belongings.  °

## 2023-06-20 NOTE — ED Provider Notes (Signed)
[facility] EMERGENCY DEPARTMENT AT [facility] Provider Note   CSN: [id number] Arrival date & time: 06/20/23  1023     History  Chief Complaint  Patient presents with   Shortness of Breath   Cough    Linda Ellis is a 47 y.o. female.  47 year old female presents with cough congestion.  History of COPD and asthma.  States she has been short of breath for last several days.  Cough is been nonproductive.  Had a fever several days ago.  Has been using her home nebulizer as well as MDI without relief.  Denies any anginal or CHF symptoms.  States that cold weather triggers       Home Medications Prior to Admission medications   Medication Sig Start Date End Date Taking? Authorizing Provider  albuterol (VENTOLIN HFA) 108 (90 Base) MCG/ACT inhaler INHALE 2 PUFFS BY MOUTH EVERY 4 HOURS AS NEEDED FOR WHEEZING FOR SHORTNESS OF BREATH 06/11/23   Mannam, Colbert Coyer, MD  budesonide-formoterol (BREYNA) 160-4.5 MCG/ACT inhaler Inhale 2 puffs into the lungs in the morning and at bedtime. 04/01/23   Luciano Cutter, MD  chlorpheniramine-HYDROcodone (TUSSIONEX) 10-8 MG/5ML Take 5 mLs by mouth at bedtime as needed for cough. 04/13/23   Luciano Cutter, MD  Dupilumab (DUPIXENT) 300 MG/2ML SOPN INJECT 1 PEN SUBCUTANEOUSLY EVERY 14 DAYS 04/13/23   Luciano Cutter, MD  famotidine (PEPCID) 20 MG tablet Take 20 mg by mouth 2 (two) times daily.    [provider]  fluticasone (FLONASE) 50 MCG/ACT nasal spray USE 1 SPRAY IN EACH NOSTRIL TWICE DAILY Patient taking differently: Place 1 spray into both nostrils daily. 04/20/22   Jacquelin Hawking, PA-C  ipratropium-albuterol (DUONEB) 0.5-2.5 (3) MG/3ML SOLN Take 3 mLs by nebulization every 6 (six) hours as needed. 04/01/23   Luciano Cutter, MD  ALBUTEROL IN Inhale 2 puffs into the lungs as needed. For shortness of breath   10/24/11  [provider]      Allergies    Patient has no known allergies.    Review of Systems   Review  of Systems  All other systems reviewed and are negative.   Physical Exam Updated Vital Signs BP (!) 135/100 (BP Location: Right Arm)   Pulse 85   Temp 97.9 F (36.6 C) (Oral)   Resp 20   Ht 1.626 m (5\' 4" )   Wt (!) 143.8 kg   SpO2 98%   BMI 54.41 kg/m  Physical Exam Vitals and nursing note reviewed.  Constitutional:      General: She is not in acute distress.    Appearance: Normal appearance. She is well-developed. She is not toxic-appearing.  HENT:     Head: Normocephalic and atraumatic.  Eyes:     General: Lids are normal.     Conjunctiva/sclera: Conjunctivae normal.     Pupils: Pupils are equal, round, and reactive to light.  Neck:     Thyroid: No thyroid mass.     Trachea: No tracheal deviation.  Cardiovascular:     Rate and Rhythm: Normal rate and regular rhythm.     Heart sounds: Normal heart sounds. No murmur heard.    No gallop.  Pulmonary:     Effort: Tachypnea and respiratory distress present.     Breath sounds: No stridor. Examination of the right-upper field reveals decreased breath sounds and wheezing. Examination of the left-upper field reveals decreased breath sounds and wheezing. Decreased breath sounds and wheezing present. No rhonchi or rales.  Abdominal:  General: There is no distension.     Palpations: Abdomen is soft.     Tenderness: There is no abdominal tenderness. There is no rebound.  Musculoskeletal:        General: No tenderness. Normal range of motion.     Cervical back: Normal range of motion and neck supple.  Skin:    General: Skin is warm and dry.     Findings: No abrasion or rash.  Neurological:     Mental Status: She is alert and oriented to person, place, and time. Mental status is at baseline.     GCS: GCS eye subscore is 4. GCS verbal subscore is 5. GCS motor subscore is 6.     Cranial Nerves: Cranial nerves are intact. No cranial nerve deficit.     Sensory: No sensory deficit.     Motor: Motor function is intact.   Psychiatric:        Attention and Perception: Attention normal.        Speech: Speech normal.        Behavior: Behavior normal.     ED Results / Procedures / Treatments   Labs (all labs ordered are listed, but only abnormal results are displayed) Labs Reviewed  SARS CORONAVIRUS 2 BY RT PCR  CBC WITH DIFFERENTIAL/PLATELET  BASIC METABOLIC PANEL    EKG EKG Interpretation Date/Time:  Sunday June 20 2023 10:35:15 EST Ventricular Rate:  72 PR Interval:  145 QRS Duration:  125 QT Interval:  393 QTC Calculation: 431 R Axis:   87  Text Interpretation: Sinus rhythm Nonspecific intraventricular conduction delay No significant change since last tracing Confirmed by Lorre Nick ([id number]) on 06/20/2023 11:18:34 AM  Radiology No results found.  Procedures Procedures    Medications Ordered in ED Medications  albuterol (PROVENTIL) (2.5 MG/3ML) 0.083% nebulizer solution 5 mg (has no administration in time range)  albuterol (PROVENTIL) (2.5 MG/3ML) 0.083% nebulizer solution 10 mg (has no administration in time range)  methylPREDNISolone sodium succinate (SOLU-MEDROL) 250 mg in sodium chloride 0.9 % 50 mL IVPB (has no administration in time range)  magnesium sulfate IVPB 2 g 50 mL (has no administration in time range)  albuterol (PROVENTIL) (2.5 MG/3ML) 0.083% nebulizer solution 2.5 mg (2.5 mg Nebulization Given 06/20/23 1038)  ipratropium-albuterol (DUONEB) 0.5-2.5 (3) MG/3ML nebulizer solution 3 mL (3 mLs Nebulization Given 06/20/23 1038)    ED Course/ Medical Decision Making/ A&P                                 Medical Decision Making Amount and/or Complexity of Data Reviewed Labs: ordered. Radiology: ordered.  Risk Prescription drug management.   Patient is EKG per interpretation shows normal sinus rhythm.  No signs of acute ischemic changes.  Patient's chest x-ray per my interpretation shows no acute findings.  Patient was severely dyspneic at this time.  She had  increased wheezing appreciated.  Was given magnesium, Solu-Medrol, albuterol 10 mg continuous treatment.  Labs here were reviewed and showed no significant findings.  Patient reassessed multiple times and her breathing is greatly improved.  Patient request to go home at this time.  Will place patient on prednisone.  She was instructed to use her home nebulizer every 4 hours and to return here if she gets worse.  Family at bedside and were informed of this plan also  CRITICAL CARE Performed by: Toy Baker Total critical care time: 60 minutes Critical care time  was exclusive of separately billable procedures and treating other patients. Critical care was necessary to treat or prevent imminent or life-threatening deterioration. Critical care was time spent personally by me on the following activities: development of treatment plan with patient and/or surrogate as well as nursing, discussions with consultants, evaluation of patient's response to treatment, examination of patient, obtaining history from patient or surrogate, ordering and performing treatments and interventions, ordering and review of laboratory studies, ordering and review of radiographic studies, pulse oximetry and re-evaluation of patient's condition.         Final Clinical Impression(s) / ED Diagnoses Final diagnoses:  None    Rx / DC Orders ED Discharge Orders     None         Lorre Nick, MD 06/20/23 1249

## 2023-06-20 NOTE — ED Notes (Signed)
RT assessed the Pt. Pt has wheezes in her lung . The wheezes in her neck are more prominent.

## 2023-06-20 NOTE — ED Triage Notes (Signed)
Arrives with complaints of increased shortness of breath x4 days. Hx of copd & asthma.

## 2023-06-21 ENCOUNTER — Telehealth (HOSPITAL_BASED_OUTPATIENT_CLINIC_OR_DEPARTMENT_OTHER): Payer: Self-pay | Admitting: Pulmonary Disease

## 2023-06-21 NOTE — Telephone Encounter (Signed)
Spoke with patient regarding prior message . Patient stated she was seen in the ED on 06/20/23 and was given Magnesium sulfate IV and she is due for her Dupixent injection today .Advised patient I will consult with Dr.Young to see if its ok to still take Dupixent. Patient's voice was understanding.   Spoke with Dr.Young ok for patient to be able to do Dupixent injection . Patient is aware and voice was understanidng.   Advised patient to call our office back if patient is still not feeling well.  Patient's voice was understanding.Nothing else further needed

## 2023-06-30 ENCOUNTER — Ambulatory Visit (HOSPITAL_BASED_OUTPATIENT_CLINIC_OR_DEPARTMENT_OTHER): Payer: BC Managed Care – PPO | Admitting: Pulmonary Disease

## 2023-07-06 ENCOUNTER — Encounter (HOSPITAL_BASED_OUTPATIENT_CLINIC_OR_DEPARTMENT_OTHER): Payer: Self-pay | Admitting: Pulmonary Disease

## 2023-07-06 ENCOUNTER — Ambulatory Visit (HOSPITAL_BASED_OUTPATIENT_CLINIC_OR_DEPARTMENT_OTHER): Payer: BC Managed Care – PPO | Admitting: Pulmonary Disease

## 2023-07-06 DIAGNOSIS — Z7952 Long term (current) use of systemic steroids: Secondary | ICD-10-CM

## 2023-07-06 DIAGNOSIS — J4550 Severe persistent asthma, uncomplicated: Secondary | ICD-10-CM

## 2023-07-06 MED ORDER — IPRATROPIUM-ALBUTEROL 0.5-2.5 (3) MG/3ML IN SOLN: 3.0000 mL | Freq: Four times a day (QID) | RESPIRATORY_TRACT | 3 refills | Status: DC | PRN

## 2023-07-06 MED ORDER — BUDESONIDE-FORMOTEROL FUMARATE 160-4.5 MCG/ACT IN AERO: 2.0000 | INHALATION_SPRAY | Freq: Two times a day (BID) | RESPIRATORY_TRACT | 11 refills | Status: DC

## 2023-07-06 MED ORDER — ALBUTEROL SULFATE HFA 108 (90 BASE) MCG/ACT IN AERS: 2.0000 | INHALATION_SPRAY | RESPIRATORY_TRACT | 5 refills | Status: DC | PRN
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
## 2023-07-06 NOTE — Patient Instructions (Signed)
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  CONTINUE Breyna 160-4.5 mcg TWO puffs TWICE a day. Rinse mouth out after use CONTINUE Albuterol as needed for shortness of breath or wheezing Self discontinued montelukast CONTINUE claritin daily CONTINUE Duonebs as needed Continue regular exercise daily  Completed FMLA paperwork today in-office with patient

## 2023-10-04 ENCOUNTER — Telehealth: Payer: Self-pay | Admitting: Pulmonary Disease

## 2023-10-04 DIAGNOSIS — J4551 Severe persistent asthma with (acute) exacerbation: Secondary | ICD-10-CM

## 2023-10-04 NOTE — Telephone Encounter (Signed)
 Refill sent for DUPIXENT to [facility] for [place]: [phone]  Dose: 300mg  SQ every 14 days  Last OV: 07/06/2023 Provider: Dr. Everardo All  Next OV: due in April 2025, not yet scheduled  Routing to scheduling team for follow-up on appt scheduling  Chesley Mires, PharmD, MPH, BCPS Clinical Pharmacist (Rheumatology and Pulmonology)

## 2023-10-04 NOTE — Telephone Encounter (Signed)
 Unable to contact patient. Left voicemail and letter has been mailed

## 2023-10-07 NOTE — Telephone Encounter (Signed)
Patient has been scheduled. Nothing further needed.  

## 2023-11-04 ENCOUNTER — Other Ambulatory Visit (HOSPITAL_BASED_OUTPATIENT_CLINIC_OR_DEPARTMENT_OTHER): Payer: Self-pay | Admitting: Pulmonary Disease

## 2023-11-06 ENCOUNTER — Other Ambulatory Visit (HOSPITAL_BASED_OUTPATIENT_CLINIC_OR_DEPARTMENT_OTHER): Payer: Self-pay | Admitting: Pulmonary Disease

## 2023-11-18 ENCOUNTER — Encounter (HOSPITAL_BASED_OUTPATIENT_CLINIC_OR_DEPARTMENT_OTHER): Payer: Self-pay | Admitting: Pulmonary Disease

## 2023-11-18 ENCOUNTER — Ambulatory Visit (HOSPITAL_BASED_OUTPATIENT_CLINIC_OR_DEPARTMENT_OTHER): Payer: Self-pay | Admitting: Pulmonary Disease

## 2023-11-18 MED ORDER — PREDNISONE 10 MG PO TABS: ORAL_TABLET | ORAL | 0 refills | Status: DC

## 2023-11-18 NOTE — Telephone Encounter (Signed)
 Copied from CRM [id number]. Topic: Clinical - Red Word Triage >> Nov 18, 2023  8:08 AM Alphonzo Lemmings O wrote: [facility] that prompted transfer to Nurse Triage: can't breathe and want doctor to call her in some prednisone   Chief Complaint: Shortness of Breath Symptoms: Shortness of breath, congestion, bilateral ear pain, nasal congestion, headache Frequency: 3 days Pertinent Negatives: Patient denies chest pain, nausea, vomiting, diarrhea, fever, coughing up blood Disposition: [] ED /[] Urgent Care (no appt availability in office) / [] Appointment(In office/virtual)/ []  [place] [facility]/ [] Home Care/ [x] Refused Recommended Disposition /[] [place] [facility]/ []  Follow-up with PCP Additional Notes: Patient called and advised that for the past 3 days she has been feeling bad having shortness of breath, dry cough with coughing up tan phlegm only after breathing treatments, bilateral ear pain, nasal congestion, headache. She denies any recent sick contacts, took a COVID test and it was negative, and believes the weather has caused her to be sick with all the pollen. She states that she does cough up some tan colored mucous. Patient denies any chest pain, fever, coughing up blood, nausea, vomiting, or diarrhea. She has been using her inhalers/maintenance medications.  She states that she believes she is having to use her nebulizer too much. Patient states that she usually just gets Prednisone and Tussinex when she has an episode like this.  She states that her heart rate on her watch is 93-95 at rest and her O2 Saturation is 97% on room air. Initially, patient was advised to be seen at the ER due to her heart rate being over 100 initially.  It was 102 when patient first put her watch on but patient states it was had been under 100 the entire time after that.  Patient didn't want to go to the ER but with her heart rate under 100 after putting the watch on from exerting to go get it, and it remaining under 100  the disposition became for the patient to be seen in the next 4 hours and evaluated. At this time, patient is advised that the recommendation is that she be seen in the next 4 hours and evaluated by a provider.  Patient states that she really doesn't want to go in to the ER or go in for an in office appointment anywhere.  She states that would just like to get the Prednisone and Tussinex called in. She is advised that if she gets worse to go to the Emergency Room.  She verbalized understanding and states that she will go to the ER to be evaluated if she gets worse.  Reason for Disposition  [1] MILD difficulty breathing (e.g., minimal/no SOB at rest, SOB with walking, pulse <100) AND [2] NEW-onset or WORSE than normal  Answer Assessment - Initial Assessment Questions E2C2 Pulmonary Triage - Initial Assessment Questions "Chief Complaint (e.g., cough, sob, wheezing, fever, chills, sweat or additional symptoms) *Go to specific symptom protocol after initial questions. Shortness of breath, congestion, bilateral ear pain, nasal congestion, headache  "How long have symptoms been present?" 3 days  Have you tested for COVID or Flu? Note: If not, ask patient if a home test can be taken. If so, instruct patient to call back for positive results. Yes---Covid Test was negative  MEDICINES:   "Have you used any OTC meds to help with symptoms?" Yes If yes, ask "What medications?" Dayquil  "Have you used your inhalers/maintenance medication?" Yes If yes, "What medications?" Albuterol Inhaler--2 puffs every 4 hours as needed Duoneb Nebulizer--Patient  has used this twice today already Breyna--2 puffs in the morning and at bedtime   If inhaler, ask "How many puffs and how often?" Note: Review instructions on medication in the chart. Albuterol Inhaler--2 puffs every 4 hours as needed Duoneb Nebulizer--Patient has used this twice today already Breyna--2 puffs in the morning and at bedtime  OXYGEN: "Do  you wear supplemental oxygen?" No If yes, "How many liters are you supposed to use?" N/A  "Do you monitor your oxygen levels?" Yes If yes, "What is your reading (oxygen level) today?" Patient has a watch that monitors Oxygen levels 97%  "What is your usual oxygen saturation reading?"  (Note: Pulmonary O2 sats should be 90% or greater) 96-98%    1. RESPIRATORY STATUS: "Describe your breathing?" (e.g., wheezing, shortness of breath, unable to speak, severe coughing)      Shortness of breath, congestion, bilateral ear pain, nasal congestion, headache 2. ONSET: "When did this breathing problem begin?"      3 days 3. PATTERN "Does the difficult breathing come and go, or has it been constant since it started?"      Constant 4. SEVERITY: "How bad is your breathing?" (e.g., mild, moderate, severe)    - MILD: No SOB at rest, mild SOB with walking, speaks normally in sentences, can lie down, no retractions, pulse < 100.    - MODERATE: SOB at rest, SOB with minimal exertion and prefers to sit, cannot lie down flat, speaks in phrases, mild retractions, audible wheezing, pulse 100-120.    - SEVERE: Very SOB at rest, speaks in single words, struggling to breathe, sitting hunched forward, retractions, pulse > 120      Feels bad sitting still--feels a little better after a breathing treatment 5. RECURRENT SYMPTOM: "Have you had difficulty breathing before?" If Yes, ask: "When was the last time?" and "What happened that time?"      Yes---Prednisone usually given & Tussinex 6. CARDIAC HISTORY: "Do you have any history of heart disease?" (e.g., heart attack, angina, bypass surgery, angioplasty)      N/A 7. LUNG HISTORY: "Do you have any history of lung disease?"  (e.g., pulmonary embolus, asthma, emphysema)     COPD, Asthma 8. CAUSE: "What do you think is causing the breathing problem?"      Possibly pollen 9. OTHER SYMPTOMS: "Do you have any other symptoms? (e.g., dizziness, runny nose, cough, chest  pain, fever)     Shortness of breath, congestion, bilateral ear pain, nasal congestion, headache 10. O2 SATURATION MONITOR:  "Do you use an oxygen saturation monitor (pulse oximeter) at home?" If Yes, ask: "What is your reading (oxygen level) today?" "What is your usual oxygen saturation reading?" (e.g., 95%)       Watch   states 97% 12. TRAVEL: "Have you traveled out of the country in the last month?" (e.g., travel history, exposures)       N/a  Protocols used: Breathing Difficulty-A-AH

## 2023-11-18 NOTE — Telephone Encounter (Signed)
**Note De-identified  [place] Obfuscation** Please advise 

## 2023-11-18 NOTE — Telephone Encounter (Signed)
 Pt.notified

## 2023-11-18 NOTE — Telephone Encounter (Signed)
 Last office note 07/2023 reviewed in detail.  Patient with history of asthma-Dr [name] patient  See phone message, recommended ER. Refused emergency room.  Current regimen is Breyna Twice daily  , Dupixent every 2 weeks.  Claritin daily.  Symptoms consistent with an asthmatic flare.  May begin a prednisone taper over the next week. 40 mg for 2 days 30 mg for 2 days 20 mg for 2 days 2 mg for 2 days and stop  If symptoms are not improving or worsen will need to seek emergency room care.  Please contact office for sooner follow up if symptoms do not improve or worsen or seek emergency care  Please make sure she has an appointment with Dr. [name] for follow up

## 2023-11-22 ENCOUNTER — Encounter (HOSPITAL_BASED_OUTPATIENT_CLINIC_OR_DEPARTMENT_OTHER): Payer: Self-pay | Admitting: Pulmonary Disease

## 2023-11-22 ENCOUNTER — Ambulatory Visit (HOSPITAL_BASED_OUTPATIENT_CLINIC_OR_DEPARTMENT_OTHER): Admitting: Pulmonary Disease

## 2023-11-22 DIAGNOSIS — J4550 Severe persistent asthma, uncomplicated: Secondary | ICD-10-CM | POA: Diagnosis not present

## 2023-11-22 DIAGNOSIS — Z7952 Long term (current) use of systemic steroids: Secondary | ICD-10-CM | POA: Diagnosis not present

## 2023-11-22 MED ORDER — ALBUTEROL SULFATE HFA 108 (90 BASE) MCG/ACT IN AERS
2.0000 | INHALATION_SPRAY | RESPIRATORY_TRACT | 11 refills | Status: DC | PRN
Start: 2023-11-22 — End: 2024-05-24

## 2023-11-22 MED ORDER — HYDROCOD POLI-CHLORPHE POLI ER 10-8 MG/5ML PO SUER: 5.0000 mL | Freq: Every evening | ORAL | 0 refills | Status: AC | PRN

## 2023-11-22 MED ORDER — PREDNISONE 10 MG PO TABS: ORAL_TABLET | ORAL | 0 refills | Status: DC

## 2023-11-22 NOTE — Patient Instructions (Addendum)
  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  CONTINUE Breyna  160-4.5 mcg TWO puffs TWICE a day. Rinse mouth out after use CONTINUE Albuterol  as needed for shortness of breath or wheezing Self discontinued montelukast  CONTINUE claritin  daily CONTINUE Duonebs as needed Continue regular exercise daily Prednisone  pack ordered for possible recurrent symptoms  Asthma Action Plan CONTINUE DUONEBS up to four times a day for worsening shortness of breath, wheezing and cough. If you symptoms do not improve in 24-48 hours, start steroid pack that has been prescribed. Please call our office for evaluation and to let us  know you steroid the steroid pack (60,50,40,30,20,10 pack).
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
## 2023-12-23 ENCOUNTER — Other Ambulatory Visit (HOSPITAL_BASED_OUTPATIENT_CLINIC_OR_DEPARTMENT_OTHER): Payer: Self-pay | Admitting: Pulmonary Disease

## 2024-01-11 ENCOUNTER — Other Ambulatory Visit: Payer: Self-pay | Admitting: Pulmonary Disease

## 2024-01-11 DIAGNOSIS — J4551 Severe persistent asthma with (acute) exacerbation: Secondary | ICD-10-CM

## 2024-01-12 NOTE — Telephone Encounter (Signed)
 Refill sent for DUPIXENT  to [facility]: [phone]  Dose: 300mg  subcut every 14 days  Last OV: 11/22/23 Provider: Dr.  [name]  Next OV: not scheduled (due in October 2025)  Geraldene Kleine, PharmD, MPH, BCPS Clinical Pharmacist (Rheumatology and Pulmonology)

## 2024-01-20 ENCOUNTER — Encounter (HOSPITAL_BASED_OUTPATIENT_CLINIC_OR_DEPARTMENT_OTHER): Payer: Self-pay | Admitting: Pulmonary Disease

## 2024-01-20 ENCOUNTER — Other Ambulatory Visit (HOSPITAL_BASED_OUTPATIENT_CLINIC_OR_DEPARTMENT_OTHER): Payer: Self-pay | Admitting: Pulmonary Disease

## 2024-01-20 MED ORDER — PREDNISONE 10 MG PO TABS: ORAL_TABLET | ORAL | 0 refills | Status: DC

## 2024-01-20 NOTE — Telephone Encounter (Signed)
**Note De-identified  [place] Obfuscation** Please advise 

## 2024-01-20 NOTE — Telephone Encounter (Signed)
 Rx sent.

## 2024-02-10 ENCOUNTER — Other Ambulatory Visit (HOSPITAL_BASED_OUTPATIENT_CLINIC_OR_DEPARTMENT_OTHER): Payer: Self-pay | Admitting: Pulmonary Disease

## 2024-03-13 ENCOUNTER — Other Ambulatory Visit (HOSPITAL_BASED_OUTPATIENT_CLINIC_OR_DEPARTMENT_OTHER): Payer: Self-pay | Admitting: Pulmonary Disease

## 2024-04-11 ENCOUNTER — Other Ambulatory Visit (HOSPITAL_BASED_OUTPATIENT_CLINIC_OR_DEPARTMENT_OTHER): Payer: Self-pay | Admitting: Pulmonary Disease

## 2024-05-02 ENCOUNTER — Telehealth: Payer: Self-pay

## 2024-05-02 NOTE — Telephone Encounter (Signed)
 PA renewal initiated automatically by CoverMyMeds.  Submitted a Prior Authorization request to [employer] for DUPIXENT  via CoverMyMeds. Will update once we receive a response.   [id number]

## 2024-05-03 ENCOUNTER — Other Ambulatory Visit (HOSPITAL_COMMUNITY): Payer: Self-pay

## 2024-05-03 NOTE — Telephone Encounter (Signed)
 Received notification from [facility] regarding a prior authorization for DUPIXENT . Authorization has been APPROVED from 05/02/24 to 05/02/25. Approval letter sent to scan center.  Patient must continue to fill through [facility]: [phone]  Authorization # [id number] Phone # [phone]

## 2024-05-09 ENCOUNTER — Other Ambulatory Visit (HOSPITAL_BASED_OUTPATIENT_CLINIC_OR_DEPARTMENT_OTHER): Payer: Self-pay | Admitting: Pulmonary Disease

## 2024-05-22 ENCOUNTER — Emergency Department (HOSPITAL_BASED_OUTPATIENT_CLINIC_OR_DEPARTMENT_OTHER)
Admission: EM | Admit: 2024-05-22 | Discharge: 2024-05-22 | Disposition: A | Discharge status: Home / Self Care | Attending: Emergency Medicine | Admitting: Emergency Medicine

## 2024-05-22 ENCOUNTER — Other Ambulatory Visit: Payer: Self-pay

## 2024-05-22 DIAGNOSIS — M545 Low back pain, unspecified: Secondary | ICD-10-CM | POA: Diagnosis present

## 2024-05-22 MED ORDER — PREDNISONE 10 MG PO TABS: ORAL_TABLET | ORAL | 0 refills | Status: AC

## 2024-05-22 MED ORDER — METHOCARBAMOL 500 MG PO TABS: 1000.0000 mg | ORAL_TABLET | Freq: Two times a day (BID) | ORAL | 0 refills | Status: AC

## 2024-05-22 MED ORDER — OXYCODONE-ACETAMINOPHEN 5-325 MG PO TABS: 1.0000 | ORAL_TABLET | Freq: Four times a day (QID) | ORAL | 0 refills | Status: AC | PRN

## 2024-05-22 NOTE — ED Provider Notes (Signed)
 [place] EMERGENCY DEPARTMENT AT [facility] Provider Note   CSN: [id number] Arrival date & time: 05/22/24  1250     Patient presents with: Back Pain   Linda Ellis is a 48 y.o. female.   Patient to ED with low back pain that started 2-3 days ago. She states she was bending over at work but doing no heavy lifting. Later that night she felt back pain that has progressed since onset. No radiation to the LE's. No numbness or weakness. No history of back pain or surgeries in the past. She has been taking Flexeril without relief. She cannot take ibuprofen  secondary to history of ulcers.   The history is provided by the patient. No language interpreter was used.  Back Pain      Prior to Admission medications   Medication Sig Start Date End Date Taking? Authorizing Provider  methocarbamol  (ROBAXIN ) 500 MG tablet Take 2 tablets (1,000 mg total) by mouth 2 (two) times daily. 05/22/24  Yes Tiahna Cure, Margit, PA-C  oxyCODONE -acetaminophen  (PERCOCET/ROXICET) 5-325 MG tablet Take 1 tablet by mouth every 6 (six) hours as needed for severe pain (pain score 7-10). 05/22/24  Yes Odell Margit, PA-C  albuterol  (VENTOLIN  HFA) 108 (90 Base) MCG/ACT inhaler Inhale 2 puffs into the lungs every 4 (four) hours as needed for wheezing or shortness of breath. 11/22/23   Kassie Acquanetta Bradley, MD  budesonide -formoterol  (BREYNA ) 160-4.5 MCG/ACT inhaler Inhale 2 puffs into the lungs in the morning and at bedtime. 07/06/23   Kassie Acquanetta Bradley, MD  chlorpheniramine-HYDROcodone  (TUSSIONEX) 10-8 MG/5ML Take 5 mLs by mouth at bedtime as needed for cough. 11/22/23   Kassie Acquanetta Bradley, MD  DUPIXENT  300 MG/2ML SOAJ INJECT 1 PEN SUBCUTANEOUSLY EVERY 14 DAYS 01/12/24   Kassie Acquanetta Bradley, MD  famotidine  (PEPCID ) 20 MG tablet Take 20 mg by mouth 2 (two) times daily.    [provider]  fluticasone  (FLONASE ) 50 MCG/ACT nasal spray USE 1 SPRAY IN EACH NOSTRIL TWICE DAILY Patient taking differently: Place 1 spray  into both nostrils daily. 04/20/22   Comer Kirsch, PA-C  ipratropium-albuterol  (DUONEB) 0.5-2.5 (3) MG/3ML SOLN USE 1 AMPULE IN NEBULIZER EVERY 6 HOURS AS NEEDED 05/09/24   Kassie Acquanetta Bradley, MD  loratadine  (CLARITIN ) 10 MG tablet Take 10 mg by mouth daily.    [provider]  predniSONE  (DELTASONE ) 10 MG tablet 4 tabs for 2 days, then 3 tabs for 2 days, 2 tabs for 2 days, then 1 tab for 2 days, then stop 05/22/24   Odell Margit, PA-C  ALBUTEROL  IN Inhale 2 puffs into the lungs as needed. For shortness of breath   10/24/11  [provider]    Allergies: Patient has no known allergies.    Review of Systems  Musculoskeletal:  Positive for back pain.    Updated Vital Signs BP 108/79   Pulse 90   Temp 98.2 F (36.8 C) (Oral)   Resp 20   SpO2 96%   Physical Exam Vitals and nursing note reviewed.  Constitutional:      Appearance: She is well-developed.  HENT:     Head: Normocephalic.  Cardiovascular:     Rate and Rhythm: Normal rate.  Pulmonary:     Effort: Pulmonary effort is normal.  Musculoskeletal:        General: Normal range of motion.     Cervical back: Normal range of motion and neck supple.     Comments: Mild lumbar and paralumbar tenderness without discoloration. Normal strength of lower  extremities.   Skin:    General: Skin is warm and dry.     Findings: No rash.  Neurological:     General: No focal deficit present.     Mental Status: She is alert and oriented to person, place, and time.     Sensory: No sensory deficit.     Motor: No weakness.     Coordination: Coordination normal.     Gait: Gait normal.     Deep Tendon Reflexes: Reflexes are normal and symmetric. Reflexes normal.     (all labs ordered are listed, but only abnormal results are displayed) Labs Reviewed - No data to display  EKG: None  Radiology: No results found.   Procedures   Medications Ordered in the ED - No data to display  Clinical Course as of 05/22/24  1646  Mon May 22, 2024  1644 Patient with 2-3 days of low back pain without neurologic deficits. Do not suspect cord involvement or radiculopathy. Will try supportive management - Robaxin , prednisone  dose pack and oxycodone  provided along with care instructions and return precautions.  [SU]    Clinical Course User Index [SU] Odell Balls, PA-C                                 Medical Decision Making       Final diagnoses:  Acute midline low back pain without sciatica    ED Discharge Orders          Ordered    predniSONE  (DELTASONE ) 10 MG tablet        05/22/24 1615    oxyCODONE -acetaminophen  (PERCOCET/ROXICET) 5-325 MG tablet  Every 6 hours PRN        05/22/24 1615    methocarbamol  (ROBAXIN ) 500 MG tablet  2 times daily        05/22/24 1615               Odell Balls, PA-C 05/22/24 1646    Cottie Donnice PARAS, MD 05/22/24 2340

## 2024-05-22 NOTE — ED Triage Notes (Addendum)
 Patient states lower back pain for the past several days. Worse with movement. States if she leans a certain direction the pain moves up her back. Denies injury. Denies urinary symptoms. History of osteoporosis in back.

## 2024-05-22 NOTE — Discharge Instructions (Signed)
 Take the medications as prescribed. Rest the back as much as possible - 'up to the bathroom only' as much as you can. Heat to the low back as instructed.   If pain persists, see your doctor for recheck. If pain worsens and/or you develop other symptoms of concern, return to the ED for further evaluation.

## 2024-05-23 ENCOUNTER — Other Ambulatory Visit: Payer: Self-pay

## 2024-05-23 ENCOUNTER — Emergency Department (HOSPITAL_BASED_OUTPATIENT_CLINIC_OR_DEPARTMENT_OTHER): Admission: EM | Admit: 2024-05-23 | Discharge: 2024-05-23 | Disposition: A | Discharge status: Home / Self Care

## 2024-05-23 ENCOUNTER — Encounter (HOSPITAL_BASED_OUTPATIENT_CLINIC_OR_DEPARTMENT_OTHER): Payer: Self-pay | Admitting: Radiology

## 2024-05-23 ENCOUNTER — Emergency Department (HOSPITAL_BASED_OUTPATIENT_CLINIC_OR_DEPARTMENT_OTHER)

## 2024-05-23 DIAGNOSIS — H5702 Anisocoria: Secondary | ICD-10-CM | POA: Insufficient documentation

## 2024-05-23 DIAGNOSIS — J45909 Unspecified asthma, uncomplicated: Secondary | ICD-10-CM | POA: Diagnosis not present

## 2024-05-23 DIAGNOSIS — H5789 Other specified disorders of eye and adnexa: Secondary | ICD-10-CM | POA: Diagnosis present

## 2024-05-23 LAB — CBC WITH DIFFERENTIAL/PLATELET
Abs Immature Granulocytes: 0.01 K/uL (ref 0.00–0.07)
Basophils Absolute: 0.1 K/uL (ref 0.0–0.1)
Basophils Relative: 1 %
Eosinophils Absolute: 0.1 K/uL (ref 0.0–0.5)
Eosinophils Relative: 2 %
HCT: 38.7 % (ref 36.0–46.0)
Hemoglobin: 13.1 g/dL (ref 12.0–15.0)
Immature Granulocytes: 0 %
Lymphocytes Relative: 39 %
Lymphs Abs: 2.5 K/uL (ref 0.7–4.0)
MCH: 30.7 pg (ref 26.0–34.0)
MCHC: 33.9 g/dL (ref 30.0–36.0)
MCV: 90.6 fL (ref 80.0–100.0)
Monocytes Absolute: 0.4 K/uL (ref 0.1–1.0)
Monocytes Relative: 6 %
Neutro Abs: 3.4 K/uL (ref 1.7–7.7)
Neutrophils Relative %: 52 %
Platelets: 385 K/uL (ref 150–400)
RBC: 4.27 MIL/uL (ref 3.87–5.11)
RDW: 12.9 % (ref 11.5–15.5)
WBC: 6.4 K/uL (ref 4.0–10.5)
nRBC: 0 % (ref 0.0–0.2)

## 2024-05-23 LAB — BASIC METABOLIC PANEL WITH GFR
Anion gap: 9 (ref 5–15)
BUN: 8 mg/dL (ref 6–20)
CO2: 25 mmol/L (ref 22–32)
Calcium: 9.1 mg/dL (ref 8.9–10.3)
Chloride: 103 mmol/L (ref 98–111)
Creatinine, Ser: 0.86 mg/dL (ref 0.44–1.00)
GFR, Estimated: >60 mL/min (ref >60)
Glucose, Bld: 94 mg/dL (ref 70–99)
Potassium: 4.1 mmol/L (ref 3.5–5.1)
Sodium: 137 mmol/L (ref 135–145)

## 2024-05-23 MED ORDER — IOHEXOL 350 MG/ML SOLN
75.0000 mL | Freq: Once | INTRAVENOUS | Status: AC | PRN
  Administered 2024-05-23: 75 mL via INTRAVENOUS

## 2024-05-23 NOTE — ED Provider Notes (Signed)
 [place] EMERGENCY DEPARTMENT AT [facility] Provider Note   CSN: [id number] Arrival date & time: 05/23/24  9282     Patient presents with: Eye Problem   Linda Ellis is a 48 y.o. female.  {Add pertinent medical, surgical, social history, OB history to HPI:7842} 48 year old female with past medical history of GERD and asthma presenting to the emergency department today with anisocoria.  The patient notes he woke up this morning and noted some mild blurred vision.  She reports that when she looked in the mirror that she noticed that her right pupil was much larger than the left.  The patient states she is having very mild periorbital discomfort.  Denies any focal weakness, numbness, or tingling or blurred vision.  The patient for she is having some mild photophobia.   Eye Problem Associated symptoms: photophobia        Prior to Admission medications   Medication Sig Start Date End Date Taking? Authorizing Provider  albuterol  (VENTOLIN  HFA) 108 (90 Base) MCG/ACT inhaler Inhale 2 puffs into the lungs every 4 (four) hours as needed for wheezing or shortness of breath. 11/22/23   Kassie Acquanetta Bradley, MD  budesonide -formoterol  (BREYNA ) 160-4.5 MCG/ACT inhaler Inhale 2 puffs into the lungs in the morning and at bedtime. 07/06/23   Kassie Acquanetta Bradley, MD  chlorpheniramine-HYDROcodone  (TUSSIONEX) 10-8 MG/5ML Take 5 mLs by mouth at bedtime as needed for cough. 11/22/23   Kassie Acquanetta Bradley, MD  DUPIXENT  300 MG/2ML SOAJ INJECT 1 PEN SUBCUTANEOUSLY EVERY 14 DAYS 01/12/24   Kassie Acquanetta Bradley, MD  famotidine  (PEPCID ) 20 MG tablet Take 20 mg by mouth 2 (two) times daily.    [provider]  fluticasone  (FLONASE ) 50 MCG/ACT nasal spray USE 1 SPRAY IN EACH NOSTRIL TWICE DAILY Patient taking differently: Place 1 spray into both nostrils daily. 04/20/22   Comer Kirsch, PA-C  ipratropium-albuterol  (DUONEB) 0.5-2.5 (3) MG/3ML SOLN USE 1 AMPULE IN NEBULIZER EVERY 6 HOURS AS NEEDED 05/09/24    Kassie Acquanetta Bradley, MD  loratadine  (CLARITIN ) 10 MG tablet Take 10 mg by mouth daily.    [provider]  methocarbamol  (ROBAXIN ) 500 MG tablet Take 2 tablets (1,000 mg total) by mouth 2 (two) times daily. 05/22/24   Odell Balls, PA-C  oxyCODONE -acetaminophen  (PERCOCET/ROXICET) 5-325 MG tablet Take 1 tablet by mouth every 6 (six) hours as needed for severe pain (pain score 7-10). 05/22/24   Odell Balls, PA-C  predniSONE  (DELTASONE ) 10 MG tablet 4 tabs for 2 days, then 3 tabs for 2 days, 2 tabs for 2 days, then 1 tab for 2 days, then stop 05/22/24   Odell Balls, PA-C  ALBUTEROL  IN Inhale 2 puffs into the lungs as needed. For shortness of breath   10/24/11  [provider]    Allergies: Patient has no known allergies.    Review of Systems  Eyes:  Positive for photophobia and visual disturbance.  All other systems reviewed and are negative.   Updated Vital Signs BP 118/74 (BP Location: Right Arm)   Pulse 79   Temp 98 F (36.7 C) (Oral)   Resp 18   SpO2 98%   Physical Exam Vitals and nursing note reviewed.   Gen: NAD Eyes: The patient's right pupil is 6 mm and reactive.  Left pupil is 3 mm and reactive.  Extraocular movements intact HEENT: no oropharyngeal swelling Neck: trachea midline Resp: clear to auscultation bilaterally Card: RRR, no murmurs, rubs, or gallops Abd: nontender, nondistended Extremities: no calf tenderness, no edema  Vascular: 2+ radial pulses bilaterally, 2+ DP pulses bilaterally Neuro: Cranial nerves intact, equal strength and sensation throughout bilateral upper and lower extremities with no dysmetria on finger-to-nose testing Skin: no rashes Psyc: acting appropriately   (all labs ordered are listed, but only abnormal results are displayed) Labs Reviewed  CBC WITH DIFFERENTIAL/PLATELET  BASIC METABOLIC PANEL WITH GFR    EKG: None  Radiology: No results found.  {Document cardiac monitor, telemetry assessment procedure when  appropriate:32947} Procedures   Medications Ordered in the ED - No data to display    {Click here for ABCD2, HEART and other calculators REFRESH Note before signing:1}                              Medical Decision Making 48 year old female with past medical history of GERD was recently seen yesterday and treated for some low back pain presenting to the emergency department today with anisocoria.  Given the acute nature of this we will obtain a CT angiogram of her head and neck to evaluate for mass lesion or aneurysmal dilation.  The patient's neuroexam is otherwise reassuring.  If this is secondary to CVA she is outside the window for thrombolytics at this time and symptoms are relatively mild and isolated.  She states that she only took half a tablet of the Percocet when she got home last night which should be out of her system and should not be causing unilateral symptoms.  I will reevaluate for ultimate disposition.  Amount and/or Complexity of Data Reviewed Labs: ordered. Radiology: ordered.   ***  {Document critical care time when appropriate  Document review of labs and clinical decision tools ie CHADS2VASC2, etc  Document your independent review of radiology images and any outside records  Document your discussion with family members, caretakers and with consultants  Document social determinants of health affecting pt's care  Document your decision making why or why not admission, treatments were needed:32947:::1}   Final diagnoses:  None    ED Discharge Orders     None

## 2024-05-23 NOTE — ED Notes (Signed)
 Patient going ED to ED for MRI--Dr. Garrick accepting

## 2024-05-23 NOTE — ED Notes (Signed)
 Pt aware of, cleared by the provider and okay with transporting herself to [facility] ER for a MRI... Pt aware of how, were and who to talk to at [facility] upon arrival... Pt AO x4 upon DC... Pt understood if at any point she was blurry or an other unsafe driving to pull over and call 911.... Report given to [place] Charge.[email or web address]

## 2024-05-23 NOTE — ED Triage Notes (Signed)
 Reports waking up this morning with blurred vision in r eye. Noticeably  dilated. Headache. Was seen here yesterday for back pain. No other unilateral deficits.   LKN 2300.

## 2024-05-23 NOTE — ED Provider Notes (Addendum)
 2:03 PM Patient seen on arrival after transfer to this facility.  Patient is presenting with anisocoria, after being seen initially at our other facility, will CT scan that was reassuring, sent here for MRI.  At the time my evaluation the patient notes that her symptoms have resolved, her pupils are essentially symmetric and she has no ongoing complaints.  We discussed possibilities for her abnormality, and we now discussed her exposure to DuoNeb and 1 I earlier today, after discussion with pharmacy this is a possibility for the asymmetric dilation, particular given the resolution here, resolution of symptoms.  Patient prefers discharge, will return or follow-up with ophthalmology as needed.     Garrick Charleston, MD 05/23/24 8596    Garrick Charleston, MD 05/23/24 [phone]

## 2024-05-23 NOTE — ED Notes (Signed)
 MRI notified that pt is here at [facility].

## 2024-05-23 NOTE — Discharge Instructions (Addendum)
 Your evaluation today has been reassuring.  Do not hesitate to return here for concerning changes in your condition.

## 2024-05-24 ENCOUNTER — Other Ambulatory Visit (HOSPITAL_BASED_OUTPATIENT_CLINIC_OR_DEPARTMENT_OTHER): Payer: Self-pay | Admitting: Pulmonary Disease

## 2024-05-24 DIAGNOSIS — J4550 Severe persistent asthma, uncomplicated: Secondary | ICD-10-CM

## 2024-05-24 NOTE — Telephone Encounter (Signed)
 Patient needs appointment for farther refills

## 2024-06-14 ENCOUNTER — Other Ambulatory Visit: Payer: Self-pay | Admitting: Pulmonary Disease

## 2024-06-14 DIAGNOSIS — J4551 Severe persistent asthma with (acute) exacerbation: Secondary | ICD-10-CM

## 2024-06-14 NOTE — Telephone Encounter (Signed)
 Pt requesting refill of specialty medication - routing to Rx team to advise.

## 2024-06-15 NOTE — Telephone Encounter (Signed)
 Refill sent for DUPIXENT  to [facility]: [phone]  Dose: 300mg  [place] every 14 days   Last OV: 11/22/23 Provider: Dr. Kassie   Next OV: 07/11/24  Aleck Puls, PharmD, BCPS Clinical Pharmacist   [facility]

## 2024-06-28 ENCOUNTER — Other Ambulatory Visit (HOSPITAL_BASED_OUTPATIENT_CLINIC_OR_DEPARTMENT_OTHER): Payer: Self-pay | Admitting: Pulmonary Disease

## 2024-06-28 DIAGNOSIS — J4550 Severe persistent asthma, uncomplicated: Secondary | ICD-10-CM

## 2024-07-06 ENCOUNTER — Other Ambulatory Visit (HOSPITAL_BASED_OUTPATIENT_CLINIC_OR_DEPARTMENT_OTHER): Payer: Self-pay | Admitting: Pulmonary Disease

## 2024-07-06 ENCOUNTER — Encounter (HOSPITAL_BASED_OUTPATIENT_CLINIC_OR_DEPARTMENT_OTHER): Payer: Self-pay | Admitting: Pulmonary Disease

## 2024-07-06 NOTE — Telephone Encounter (Signed)
 Pt requesting Prednisone . Please advise

## 2024-07-06 NOTE — Telephone Encounter (Signed)
 Patient needs appointment for farther refills

## 2024-07-07 MED ORDER — PREDNISONE 10 MG PO TABS: ORAL_TABLET | ORAL | 0 refills | Status: AC

## 2024-07-08 ENCOUNTER — Encounter (HOSPITAL_BASED_OUTPATIENT_CLINIC_OR_DEPARTMENT_OTHER): Payer: Self-pay

## 2024-07-08 ENCOUNTER — Other Ambulatory Visit: Payer: Self-pay

## 2024-07-08 ENCOUNTER — Emergency Department (HOSPITAL_BASED_OUTPATIENT_CLINIC_OR_DEPARTMENT_OTHER): Admitting: Radiology

## 2024-07-08 ENCOUNTER — Emergency Department (HOSPITAL_BASED_OUTPATIENT_CLINIC_OR_DEPARTMENT_OTHER)
Admission: EM | Admit: 2024-07-08 | Discharge: 2024-07-08 | Disposition: A | Discharge status: Home / Self Care | Attending: Emergency Medicine | Admitting: Emergency Medicine

## 2024-07-08 DIAGNOSIS — R062 Wheezing: Secondary | ICD-10-CM | POA: Insufficient documentation

## 2024-07-08 DIAGNOSIS — J449 Chronic obstructive pulmonary disease, unspecified: Secondary | ICD-10-CM | POA: Insufficient documentation

## 2024-07-08 DIAGNOSIS — R0602 Shortness of breath: Secondary | ICD-10-CM | POA: Insufficient documentation

## 2024-07-08 DIAGNOSIS — R5381 Other malaise: Secondary | ICD-10-CM | POA: Insufficient documentation

## 2024-07-08 DIAGNOSIS — R0789 Other chest pain: Secondary | ICD-10-CM | POA: Insufficient documentation

## 2024-07-08 DIAGNOSIS — R059 Cough, unspecified: Secondary | ICD-10-CM | POA: Insufficient documentation

## 2024-07-08 LAB — RESP PANEL BY RT-PCR (RSV, FLU A&B, COVID)  RVPGX2
Influenza A by PCR: NEGATIVE
Influenza B by PCR: NEGATIVE
Resp Syncytial Virus by PCR: NEGATIVE
SARS Coronavirus 2 by RT PCR: NEGATIVE

## 2024-07-08 MED ORDER — AZITHROMYCIN 500 MG PO TABS: 500.0000 mg | ORAL_TABLET | Freq: Every day | ORAL | 0 refills | Status: AC

## 2024-07-08 MED ORDER — IPRATROPIUM-ALBUTEROL 0.5-2.5 (3) MG/3ML IN SOLN
3.0000 mL | Freq: Once | RESPIRATORY_TRACT | Status: AC
  Administered 2024-07-08: 3 mL via RESPIRATORY_TRACT

## 2024-07-08 MED ORDER — METHYLPREDNISOLONE SODIUM SUCC 125 MG IJ SOLR
125.0000 mg | Freq: Once | INTRAMUSCULAR | Status: AC
  Administered 2024-07-08: 125 mg via INTRAVENOUS
  Filled 2024-07-08: qty 2

## 2024-07-08 MED ORDER — IPRATROPIUM-ALBUTEROL 0.5-2.5 (3) MG/3ML IN SOLN
RESPIRATORY_TRACT | Status: DC
  Filled 2024-07-08: qty 3

## 2024-07-08 NOTE — Discharge Instructions (Signed)
 As we discussed, I would like for you to follow-up with your pulmonologist and/or primary care physician.  I have prescribed you some antibiotics.  Please take as prescribed.  You can return to the emergency department for any worsening symptoms.

## 2024-07-08 NOTE — ED Notes (Signed)
 ED Provider at bedside.

## 2024-07-08 NOTE — ED Provider Notes (Signed)
 [place] EMERGENCY DEPARTMENT AT [facility] Provider Note   CSN: [id number] Arrival date & time: 07/08/24  1234     Patient presents with: Shortness of Breath and Cough   Linda Ellis is a 48 y.o. female patient with history of asthma and COPD who presents to the emergency department today for further evaluation of shortness of breath.  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  She does note that some people at work have been sick.  She was overall feeling poorly with a productive cough with brown sputum, shortness of breath, and general malaise.  She started getting better and went to work yesterday again working in the cold.  She woke up this morning having a very hard time breathing.  She tried a nebulizer treatment at the house with little relief.  She does report some associated chest tightness with the cough and shortness of breath.  She denies fever or chills, nausea, vomiting, diarrhea.    Shortness of Breath Associated symptoms: cough   Cough Associated symptoms: shortness of breath        Prior to Admission medications   Medication Sig Start Date End Date Taking? Authorizing Provider  azithromycin  (ZITHROMAX ) 500 MG tablet Take 1 tablet (500 mg total) by mouth daily. Take first 2 tablets together, then 1 every day until finished. 07/08/24  Yes Stancil Deisher M, PA-C  albuterol  (VENTOLIN  HFA) 108 (90 Base) MCG/ACT inhaler INHALE 2 PUFFS BY MOUTH EVERY 4 HOURS AS NEEDED FOR WHEEZING AND FOR SHORTNESS OF BREATH 06/28/24   Kassie Acquanetta Bradley, MD  BREYNA  160-4.5 MCG/ACT inhaler INHALE 2 PUFFS IN THE MORNING AND 2 AT BEDTIME 07/06/24   Kassie Acquanetta Bradley, MD  chlorpheniramine-HYDROcodone  (TUSSIONEX) 10-8 MG/5ML Take 5 mLs by mouth at bedtime as needed for cough. 11/22/23   Kassie Acquanetta Bradley, MD  DUPIXENT  300 MG/2ML SOAJ INJECT 1 PEN SUBCUTANEOUSLY EVERY 14 DAYS  06/15/24   Kassie Acquanetta Bradley, MD  famotidine  (PEPCID ) 20 MG tablet Take 20 mg by mouth 2 (two) times daily.    [provider]  fluticasone  (FLONASE ) 50 MCG/ACT nasal spray USE 1 SPRAY IN EACH NOSTRIL TWICE DAILY Patient taking differently: Place 1 spray into both nostrils daily. 04/20/22   Comer Kirsch, PA-C  ipratropium-albuterol  (DUONEB) 0.5-2.5 (3) MG/3ML SOLN USE 1 AMPULE IN NEBULIZER EVERY 6 HOURS AS NEEDED 05/09/24   Kassie Acquanetta Bradley, MD  loratadine  (CLARITIN ) 10 MG tablet Take 10 mg by mouth daily.    [provider]  methocarbamol  (ROBAXIN ) 500 MG tablet Take 2 tablets (1,000 mg total) by mouth 2 (two) times daily. 05/22/24   Odell Balls, PA-C  oxyCODONE -acetaminophen  (PERCOCET/ROXICET) 5-325 MG tablet Take 1 tablet by mouth every 6 (six) hours as needed for severe pain (pain score 7-10). 05/22/24   Odell Balls, PA-C  predniSONE  (DELTASONE ) 10 MG tablet 4 tabs for 2 days, then 3 tabs for 2 days, 2 tabs for 2 days, then 1 tab for 2 days, then stop 05/22/24   Odell Balls, PA-C  predniSONE  (DELTASONE ) 10 MG tablet Take 6 tablets x three days (60 mg), followed by 5 tablets x three days (50mg ), then 4 tablets x three day(40mg ), then 3 tablets (30mg ) x three days, then 2 tablets (20mg ) x three days, then 1 tablet (10mg ) x three days, then STOP 07/07/24   Kassie Acquanetta Bradley, MD  ALBUTEROL  IN Inhale 2  puffs into the lungs as needed. For shortness of breath   10/24/11  [provider]    Allergies: Patient has no known allergies.    Review of Systems  Respiratory:  Positive for cough and shortness of breath.   All other systems reviewed and are negative.   Updated Vital Signs BP 119/81   Pulse 81   Temp 98.5 F (36.9 C) (Oral)   Resp 18   Ht 5' 4 (1.626 m)   Wt (!) 147.7 kg   SpO2 97%   BMI 55.89 kg/m   Physical Exam Vitals and nursing note reviewed.  Constitutional:      General: She is not in acute distress.    Appearance: Normal appearance.   HENT:     Head: Normocephalic and atraumatic.  Eyes:     General:        Right eye: No discharge.        Left eye: No discharge.  Cardiovascular:     Comments: Regular rate and rhythm.  S1/S2 are distinct without any evidence of murmur, rubs, or gallops.  Radial pulses are 2+ bilaterally.  Dorsalis pedis pulses are 2+ bilaterally.  No evidence of pedal edema. Pulmonary:     Effort: Pulmonary effort is normal.     Comments: Mild diffuse expiratory wheezing. Abdominal:     General: Abdomen is flat. Bowel sounds are normal. There is no distension.     Tenderness: There is no abdominal tenderness. There is no guarding or rebound.  Musculoskeletal:        General: Normal range of motion.     Cervical back: Neck supple.  Skin:    General: Skin is warm and dry.     Findings: No rash.  Neurological:     General: No focal deficit present.     Mental Status: She is alert.  Psychiatric:        Mood and Affect: Mood normal.        Behavior: Behavior normal.     (all labs ordered are listed, but only abnormal results are displayed) Labs Reviewed  RESP PANEL BY RT-PCR (RSV, FLU A&B, COVID)  [id number]    EKG: None  Radiology: DG Chest 2 View Result Date: 07/08/2024 CLINICAL DATA:  Shortness of breath. EXAM: CHEST - 2 VIEW COMPARISON:  06/20/2023. FINDINGS: The heart size and mediastinal contours are within normal limits. No consolidation, effusion, or pneumothorax is seen. No acute osseous abnormality. IMPRESSION: No active cardiopulmonary disease. Electronically Signed   By: Leita Birmingham M.D.   On: 07/08/2024 15:20     Procedures   Medications Ordered in the ED  ipratropium-albuterol  (DUONEB) 0.5-2.5 (3) MG/3ML nebulizer solution (  Canceled Entry 07/08/24 1255)  ipratropium-albuterol  (DUONEB) 0.5-2.5 (3) MG/3ML nebulizer solution 3 mL (3 mLs Nebulization Given 07/08/24 1250)  methylPREDNISolone  sodium succinate (SOLU-MEDROL ) 125 mg/2 mL injection 125 mg (125 mg Intravenous Given  07/08/24 1354)    Clinical Course as of 07/08/24 1621  Sat Jul 08, 2024  1523 Resp panel by RT-PCR (RSV, Flu A&B, Covid) Anterior Nasal Swab Negative. [CF]  1523 DG Chest 2 View Negative. [CF]  1614 On repeat evaluation, patient states she is feeling better.  She also took a shot of her inhaler.  She has plenty of inhaler puffs left in addition to albuterol  solution at home.  Patient called her pulmonologist a couple of days ago who prescribed her some steroids she just has not been able to pick it up yet.  We  discussed the labs and the imaging at length.  Given her history of COPD and change in sputum color I will likely call in some antibiotics prophylactically.  Patient wanting to go home.  Will provide work note.  Vital signs are looking good. [CF]    Clinical Course User Index [CF] Theotis Cameron HERO, PA-C                                 Medical Decision Making This patient presents to the ED for concern of shortness of breath, this involves an extensive number of treatment options, and is a complaint that carries with it a high risk of complications and morbidity.  The differential diagnosis includes asthma, COPD exacerbation, pneumonia, bronchitis, postviral syndrome.   Co morbidities that complicate the patient evaluation  Obesity Asthma COPD   Additional history obtained:  Additional history and/or information obtained from chart reviewed.   Lab Tests:  I Ordered, and personally interpreted labs.  The pertinent results include: Respiratory panel which was negative   Imaging Studies ordered:  I ordered imaging studies including chest x-ray I independently visualized and interpreted imaging which showed no acute abnormalities I agree with the radiologist interpretation   Cardiac Monitoring:  The patient was maintained on a cardiac monitor.  I personally viewed and interpreted the cardiac monitored which showed an underlying rhythm of: Normal sinus  rhythm   Medicines ordered and prescription drug management:  I ordered medication including Tylenol  and Tessalon  [place] and DuoNeb for headache, cough, and shortness of breath respectively.  Also ordered steroids for shortness of breath. Reevaluation of the patient after these medicines showed that the patient improved I have reviewed the patients home medicines and have made adjustments as needed   Test Considered:  None   Critical Interventions:  Nebulizer treatments, Solu-Medrol    Consultations Obtained:  None   Problem List / ED Course:  See ED clinical course   Reevaluation:  After the interventions noted above, I reevaluated the patient and found that they have :improved   Social Determinants of Health:  Health literacy   Disposition:  After consideration of the diagnostic results and the patients response to treatment, I feel that the patient would benefit from discharge.  Will prescribe azithromycin  for 5 days given COPD exacerbation with new sputum change.   Amount and/or Complexity of Data Reviewed Labs:  Decision-making details documented in ED Course. Radiology: ordered. Decision-making details documented in ED Course.  Risk Prescription drug management.     Final diagnoses:  Shortness of breath    ED Discharge Orders          Ordered    azithromycin  (ZITHROMAX ) 500 MG tablet  Daily        07/08/24 1617               Theotis Cameron [place], [place] 07/08/24 1621    Charlyn Sora, MD 07/09/24 [phone]

## 2024-07-08 NOTE — ED Triage Notes (Signed)
 Arrives POV with complaints of worsening shortness of breath and cough x5 days.  Hx of asthma and COPD.

## 2024-07-10 ENCOUNTER — Telehealth (HOSPITAL_BASED_OUTPATIENT_CLINIC_OR_DEPARTMENT_OTHER): Payer: Self-pay | Admitting: Pulmonary Disease

## 2024-07-10 NOTE — Telephone Encounter (Signed)
 Left voicemail for patient regarding her 8:45am appointment on 12/9. [facility] is functioning under a 2 hour delay given the poor weather conditions. When patient calls back, please transfer to [facility] front desk as we have overbook capabilities.

## 2024-07-11 ENCOUNTER — Ambulatory Visit (HOSPITAL_BASED_OUTPATIENT_CLINIC_OR_DEPARTMENT_OTHER): Admitting: Pulmonary Disease

## 2024-07-12 ENCOUNTER — Encounter (HOSPITAL_BASED_OUTPATIENT_CLINIC_OR_DEPARTMENT_OTHER): Payer: Self-pay | Admitting: Pulmonary Disease

## 2024-07-12 ENCOUNTER — Encounter (HOSPITAL_BASED_OUTPATIENT_CLINIC_OR_DEPARTMENT_OTHER): Payer: Self-pay

## 2024-07-12 ENCOUNTER — Ambulatory Visit (INDEPENDENT_AMBULATORY_CARE_PROVIDER_SITE_OTHER): Admitting: Pulmonary Disease

## 2024-07-12 VITALS — BP 144/85 | HR 85 | Temp 98.8°F | Ht 64.0 in | Wt 334.2 lb

## 2024-07-12 DIAGNOSIS — L640 Drug-induced androgenic alopecia: Secondary | ICD-10-CM

## 2024-07-12 DIAGNOSIS — Z7952 Long term (current) use of systemic steroids: Secondary | ICD-10-CM | POA: Diagnosis not present

## 2024-07-12 DIAGNOSIS — M818 Other osteoporosis without current pathological fracture: Secondary | ICD-10-CM

## 2024-07-12 DIAGNOSIS — Z79899 Other long term (current) drug therapy: Secondary | ICD-10-CM | POA: Diagnosis not present

## 2024-07-12 DIAGNOSIS — J4551 Severe persistent asthma with (acute) exacerbation: Secondary | ICD-10-CM | POA: Diagnosis not present

## 2024-07-12 MED ORDER — BUDESONIDE-FORMOTEROL FUMARATE 160-4.5 MCG/ACT IN AERO: 2.0000 | INHALATION_SPRAY | Freq: Two times a day (BID) | RESPIRATORY_TRACT | 11 refills | Status: AC

## 2024-07-12 MED ORDER — PROMETHAZINE-DM 6.25-15 MG/5ML PO SYRP: 5.0000 mL | ORAL_SOLUTION | Freq: Four times a day (QID) | ORAL | 0 refills | Status: AC | PRN

## 2024-07-12 MED ORDER — ALBUTEROL SULFATE HFA 108 (90 BASE) MCG/ACT IN AERS: 2.0000 | INHALATION_SPRAY | RESPIRATORY_TRACT | 3 refills | Status: AC | PRN

## 2024-07-12 NOTE — Patient Instructions (Signed)
 Severe persistent asthma dependent on systemic steroids with acute exacerbation (HCC) Complete prednisone  and azithromycin . Please call if symptoms still persistent CONTINUE Dupixent  every 2 weeks.  CONTINUE Breyna  160-4.5 mcg TWO puffs TWICE a day. Rinse mouth out after use CONTINUE Albuterol  as needed for shortness of breath or wheezing CONTINUE Duonebs q6h as needed Self discontinued montelukast  CONTINUE claritin  daily Promethazine -DM cough syrup ordered  Asthma Action Plan CONTINUE DUONEBS up to four times a day for worsening shortness of breath, wheezing and cough. If you symptoms do not improve in 24-48 hours, start steroid pack that has been prescribed. Please call our office for evaluation and to let us  know you steroid the steroid pack (60,50,40,30,20,10 pack).
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
## 2024-07-21 ENCOUNTER — Telehealth (HOSPITAL_BASED_OUTPATIENT_CLINIC_OR_DEPARTMENT_OTHER): Payer: Self-pay | Admitting: Pulmonary Disease

## 2024-07-21 NOTE — Telephone Encounter (Signed)
 Patient presented in office to drop off FMLA paperwork for Dr Kassie. Informed patient that Dr Kassie will not be back in clinic until 12/31, and that it will could take 7-10 business days to complete after that per [facility] policy. Patient verbalized understanding. Paperwork placed in JE box.

## 2024-08-02 ENCOUNTER — Encounter (HOSPITAL_BASED_OUTPATIENT_CLINIC_OR_DEPARTMENT_OTHER): Payer: Self-pay | Admitting: Pulmonary Disease

## 2024-08-09 ENCOUNTER — Other Ambulatory Visit (HOSPITAL_BASED_OUTPATIENT_CLINIC_OR_DEPARTMENT_OTHER): Payer: Self-pay

## 2024-08-28 ENCOUNTER — Other Ambulatory Visit: Payer: Self-pay | Admitting: Pulmonary Disease

## 2024-08-28 DIAGNOSIS — J4551 Severe persistent asthma with (acute) exacerbation: Secondary | ICD-10-CM

## 2024-08-29 NOTE — Telephone Encounter (Signed)
 Speciality med refill
